# Patient Record
Sex: Male | Born: 1950 | Race: White | Hispanic: No | Marital: Married | Smoking: Never smoker
Health system: Southern US, Community
[De-identification: ages and names within clinical notes are randomized; demographics above are authoritative.]

## PROBLEM LIST (undated history)

## (undated) DIAGNOSIS — I5021 Acute systolic (congestive) heart failure: Secondary | ICD-10-CM

## (undated) DIAGNOSIS — R519 Headache, unspecified: Secondary | ICD-10-CM

## (undated) DIAGNOSIS — H33319 Horseshoe tear of retina without detachment, unspecified eye: Secondary | ICD-10-CM

## (undated) DIAGNOSIS — K219 Gastro-esophageal reflux disease without esophagitis: Secondary | ICD-10-CM

## (undated) DIAGNOSIS — G629 Polyneuropathy, unspecified: Secondary | ICD-10-CM

## (undated) DIAGNOSIS — I10 Essential (primary) hypertension: Secondary | ICD-10-CM

## (undated) DIAGNOSIS — F419 Anxiety disorder, unspecified: Secondary | ICD-10-CM

## (undated) DIAGNOSIS — E291 Testicular hypofunction: Secondary | ICD-10-CM

## (undated) DIAGNOSIS — U071 COVID-19: Secondary | ICD-10-CM

## (undated) DIAGNOSIS — I219 Acute myocardial infarction, unspecified: Secondary | ICD-10-CM

## (undated) DIAGNOSIS — G4733 Obstructive sleep apnea (adult) (pediatric): Secondary | ICD-10-CM

## (undated) DIAGNOSIS — F32A Depression, unspecified: Secondary | ICD-10-CM

## (undated) DIAGNOSIS — E119 Type 2 diabetes mellitus without complications: Secondary | ICD-10-CM

## (undated) DIAGNOSIS — D649 Anemia, unspecified: Secondary | ICD-10-CM

## (undated) DIAGNOSIS — E559 Vitamin D deficiency, unspecified: Secondary | ICD-10-CM

## (undated) DIAGNOSIS — I214 Non-ST elevation (NSTEMI) myocardial infarction: Secondary | ICD-10-CM

## (undated) DIAGNOSIS — Z8709 Personal history of other diseases of the respiratory system: Secondary | ICD-10-CM

## (undated) DIAGNOSIS — R51 Headache: Secondary | ICD-10-CM

## (undated) DIAGNOSIS — E785 Hyperlipidemia, unspecified: Secondary | ICD-10-CM

## (undated) DIAGNOSIS — E1143 Type 2 diabetes mellitus with diabetic autonomic (poly)neuropathy: Secondary | ICD-10-CM

## (undated) DIAGNOSIS — I951 Orthostatic hypotension: Secondary | ICD-10-CM

## (undated) DIAGNOSIS — F329 Major depressive disorder, single episode, unspecified: Secondary | ICD-10-CM

## (undated) DIAGNOSIS — C801 Malignant (primary) neoplasm, unspecified: Secondary | ICD-10-CM

## (undated) DIAGNOSIS — M1990 Unspecified osteoarthritis, unspecified site: Secondary | ICD-10-CM

## (undated) DIAGNOSIS — M549 Dorsalgia, unspecified: Secondary | ICD-10-CM

## (undated) DIAGNOSIS — G8929 Other chronic pain: Secondary | ICD-10-CM

## (undated) DIAGNOSIS — I509 Heart failure, unspecified: Secondary | ICD-10-CM

## (undated) DIAGNOSIS — Z87442 Personal history of urinary calculi: Secondary | ICD-10-CM

## (undated) DIAGNOSIS — R3911 Hesitancy of micturition: Secondary | ICD-10-CM

## (undated) DIAGNOSIS — M4802 Spinal stenosis, cervical region: Secondary | ICD-10-CM

## (undated) DIAGNOSIS — M5135 Other intervertebral disc degeneration, thoracolumbar region: Secondary | ICD-10-CM

## (undated) DIAGNOSIS — L409 Psoriasis, unspecified: Secondary | ICD-10-CM

## (undated) DIAGNOSIS — R0600 Dyspnea, unspecified: Secondary | ICD-10-CM

## (undated) DIAGNOSIS — S83206A Unspecified tear of unspecified meniscus, current injury, right knee, initial encounter: Secondary | ICD-10-CM

## (undated) HISTORY — PX: BACK SURGERY: SHX140

## (undated) HISTORY — PX: CATARACT EXTRACTION W/ INTRAOCULAR LENS  IMPLANT, BILATERAL: SHX1307

## (undated) HISTORY — PX: EXTRACORPOREAL SHOCK WAVE LITHOTRIPSY: SHX1557

## (undated) HISTORY — DX: Type 2 diabetes mellitus with diabetic autonomic (poly)neuropathy: E11.43

## (undated) HISTORY — DX: Horseshoe tear of retina without detachment, unspecified eye: H33.319

## (undated) HISTORY — PX: OTHER SURGICAL HISTORY: SHX169

---

## 1994-08-14 HISTORY — PX: RADICAL ORCHIECTOMY: SHX2285

## 1999-02-23 ENCOUNTER — Ambulatory Visit (HOSPITAL_COMMUNITY): Admission: RE | Admit: 1999-02-23 | Discharge: 1999-02-23 | Payer: Self-pay | Admitting: Family Medicine

## 1999-02-23 ENCOUNTER — Encounter: Payer: Self-pay | Admitting: Family Medicine

## 1999-06-06 ENCOUNTER — Encounter: Payer: Self-pay | Admitting: Family Medicine

## 1999-06-06 ENCOUNTER — Ambulatory Visit (HOSPITAL_COMMUNITY): Admission: RE | Admit: 1999-06-06 | Discharge: 1999-06-06 | Payer: Self-pay | Admitting: Family Medicine

## 1999-06-28 ENCOUNTER — Emergency Department (HOSPITAL_COMMUNITY): Admission: EM | Admit: 1999-06-28 | Discharge: 1999-06-28 | Payer: Self-pay | Admitting: Emergency Medicine

## 1999-06-28 ENCOUNTER — Encounter: Payer: Self-pay | Admitting: Emergency Medicine

## 1999-09-07 ENCOUNTER — Encounter: Payer: Self-pay | Admitting: Urology

## 1999-09-08 ENCOUNTER — Ambulatory Visit (HOSPITAL_COMMUNITY): Admission: RE | Admit: 1999-09-08 | Discharge: 1999-09-08 | Payer: Self-pay | Admitting: Urology

## 1999-09-08 ENCOUNTER — Encounter: Payer: Self-pay | Admitting: Urology

## 1999-10-06 ENCOUNTER — Encounter: Payer: Self-pay | Admitting: Urology

## 1999-10-06 ENCOUNTER — Ambulatory Visit (HOSPITAL_COMMUNITY): Admission: RE | Admit: 1999-10-06 | Discharge: 1999-10-06 | Payer: Self-pay | Admitting: Urology

## 2000-09-06 ENCOUNTER — Emergency Department (HOSPITAL_COMMUNITY): Admission: EM | Admit: 2000-09-06 | Discharge: 2000-09-06 | Payer: Self-pay | Admitting: Emergency Medicine

## 2000-10-07 ENCOUNTER — Encounter: Payer: Self-pay | Admitting: Neurosurgery

## 2000-10-07 ENCOUNTER — Ambulatory Visit (HOSPITAL_COMMUNITY): Admission: RE | Admit: 2000-10-07 | Discharge: 2000-10-07 | Payer: Self-pay | Admitting: Neurosurgery

## 2000-11-07 ENCOUNTER — Encounter: Payer: Self-pay | Admitting: Neurosurgery

## 2000-11-07 ENCOUNTER — Ambulatory Visit (HOSPITAL_COMMUNITY): Admission: RE | Admit: 2000-11-07 | Discharge: 2000-11-07 | Payer: Self-pay | Admitting: Neurosurgery

## 2000-11-21 ENCOUNTER — Encounter: Admission: RE | Admit: 2000-11-21 | Discharge: 2000-11-21 | Payer: Self-pay | Admitting: Neurosurgery

## 2000-11-21 ENCOUNTER — Encounter: Payer: Self-pay | Admitting: Neurosurgery

## 2000-12-05 ENCOUNTER — Encounter: Payer: Self-pay | Admitting: Neurosurgery

## 2000-12-05 ENCOUNTER — Encounter: Admission: RE | Admit: 2000-12-05 | Discharge: 2000-12-05 | Payer: Self-pay | Admitting: Neurosurgery

## 2001-06-04 ENCOUNTER — Encounter: Admission: RE | Admit: 2001-06-04 | Discharge: 2001-06-04 | Payer: Self-pay | Admitting: Neurosurgery

## 2001-06-04 ENCOUNTER — Encounter: Payer: Self-pay | Admitting: Neurosurgery

## 2001-09-26 ENCOUNTER — Encounter: Payer: Self-pay | Admitting: Neurosurgery

## 2001-09-26 ENCOUNTER — Encounter: Admission: RE | Admit: 2001-09-26 | Discharge: 2001-09-26 | Payer: Self-pay | Admitting: Neurosurgery

## 2001-10-15 ENCOUNTER — Encounter: Payer: Self-pay | Admitting: Neurosurgery

## 2001-10-15 ENCOUNTER — Encounter: Admission: RE | Admit: 2001-10-15 | Discharge: 2001-10-15 | Payer: Self-pay | Admitting: Neurosurgery

## 2002-04-21 ENCOUNTER — Encounter: Admission: RE | Admit: 2002-04-21 | Discharge: 2002-04-21 | Payer: Self-pay | Admitting: Neurosurgery

## 2002-04-21 ENCOUNTER — Encounter: Payer: Self-pay | Admitting: Neurosurgery

## 2002-05-06 ENCOUNTER — Encounter: Admission: RE | Admit: 2002-05-06 | Discharge: 2002-05-06 | Payer: Self-pay | Admitting: Neurosurgery

## 2002-05-06 ENCOUNTER — Encounter: Payer: Self-pay | Admitting: Neurosurgery

## 2002-05-20 ENCOUNTER — Encounter: Admission: RE | Admit: 2002-05-20 | Discharge: 2002-05-20 | Payer: Self-pay | Admitting: Thoracic Surgery

## 2002-05-20 ENCOUNTER — Encounter: Payer: Self-pay | Admitting: Neurosurgery

## 2003-01-26 ENCOUNTER — Encounter: Payer: Self-pay | Admitting: Neurosurgery

## 2003-01-26 ENCOUNTER — Encounter: Admission: RE | Admit: 2003-01-26 | Discharge: 2003-01-26 | Payer: Self-pay | Admitting: Neurosurgery

## 2003-01-26 ENCOUNTER — Encounter: Payer: Self-pay | Admitting: Radiology

## 2003-02-11 ENCOUNTER — Encounter: Payer: Self-pay | Admitting: Neurosurgery

## 2003-02-11 ENCOUNTER — Encounter: Admission: RE | Admit: 2003-02-11 | Discharge: 2003-02-11 | Payer: Self-pay | Admitting: Neurosurgery

## 2003-02-25 ENCOUNTER — Encounter: Admission: RE | Admit: 2003-02-25 | Discharge: 2003-02-25 | Payer: Self-pay | Admitting: Neurosurgery

## 2003-02-25 ENCOUNTER — Encounter: Payer: Self-pay | Admitting: Neurosurgery

## 2003-08-18 ENCOUNTER — Encounter: Admission: RE | Admit: 2003-08-18 | Discharge: 2003-08-18 | Payer: Self-pay | Admitting: Neurosurgery

## 2003-09-01 ENCOUNTER — Encounter: Admission: RE | Admit: 2003-09-01 | Discharge: 2003-09-01 | Payer: Self-pay | Admitting: Neurosurgery

## 2003-09-15 ENCOUNTER — Encounter: Admission: RE | Admit: 2003-09-15 | Discharge: 2003-09-15 | Payer: Self-pay | Admitting: Neurosurgery

## 2003-10-01 ENCOUNTER — Ambulatory Visit (HOSPITAL_COMMUNITY): Admission: RE | Admit: 2003-10-01 | Discharge: 2003-10-01 | Payer: Self-pay | Admitting: Family Medicine

## 2004-04-07 ENCOUNTER — Encounter: Admission: RE | Admit: 2004-04-07 | Discharge: 2004-04-07 | Payer: Self-pay | Admitting: Neurosurgery

## 2004-04-22 ENCOUNTER — Encounter: Admission: RE | Admit: 2004-04-22 | Discharge: 2004-04-22 | Payer: Self-pay | Admitting: Neurosurgery

## 2004-07-20 ENCOUNTER — Encounter: Admission: RE | Admit: 2004-07-20 | Discharge: 2004-07-20 | Payer: Self-pay | Admitting: Neurosurgery

## 2004-09-21 ENCOUNTER — Encounter: Admission: RE | Admit: 2004-09-21 | Discharge: 2004-09-21 | Payer: Self-pay | Admitting: Neurosurgery

## 2004-09-30 ENCOUNTER — Emergency Department (HOSPITAL_COMMUNITY): Admission: EM | Admit: 2004-09-30 | Discharge: 2004-09-30 | Payer: Self-pay | Admitting: Emergency Medicine

## 2004-11-10 ENCOUNTER — Encounter: Admission: RE | Admit: 2004-11-10 | Discharge: 2004-11-10 | Payer: Self-pay | Admitting: Neurosurgery

## 2005-01-13 ENCOUNTER — Encounter: Admission: RE | Admit: 2005-01-13 | Discharge: 2005-01-13 | Payer: Self-pay | Admitting: Neurosurgery

## 2005-04-07 ENCOUNTER — Encounter: Admission: RE | Admit: 2005-04-07 | Discharge: 2005-04-07 | Payer: Self-pay | Admitting: Neurosurgery

## 2005-05-31 ENCOUNTER — Encounter: Admission: RE | Admit: 2005-05-31 | Discharge: 2005-05-31 | Payer: Self-pay | Admitting: Neurosurgery

## 2005-07-13 ENCOUNTER — Encounter: Admission: RE | Admit: 2005-07-13 | Discharge: 2005-07-13 | Payer: Self-pay | Admitting: Family Medicine

## 2005-08-14 HISTORY — PX: KNEE ARTHROSCOPY: SUR90

## 2005-10-16 ENCOUNTER — Encounter: Admission: RE | Admit: 2005-10-16 | Discharge: 2005-10-16 | Payer: Self-pay | Admitting: Neurosurgery

## 2006-01-29 ENCOUNTER — Encounter: Admission: RE | Admit: 2006-01-29 | Discharge: 2006-01-29 | Payer: Self-pay | Admitting: Neurosurgery

## 2006-02-19 ENCOUNTER — Encounter: Admission: RE | Admit: 2006-02-19 | Discharge: 2006-02-19 | Payer: Self-pay | Admitting: Neurosurgery

## 2006-05-15 ENCOUNTER — Encounter: Admission: RE | Admit: 2006-05-15 | Discharge: 2006-05-15 | Payer: Self-pay | Admitting: Neurosurgery

## 2007-09-16 ENCOUNTER — Inpatient Hospital Stay (HOSPITAL_COMMUNITY): Admission: RE | Admit: 2007-09-16 | Discharge: 2007-09-19 | Payer: Self-pay | Admitting: Orthopedic Surgery

## 2007-09-16 HISTORY — PX: TOTAL KNEE ARTHROPLASTY: SHX125

## 2010-12-27 NOTE — H&P (Signed)
NAME:  Devin Combs, Devin Combs NO.:  [id number]   MEDICAL RECORD NO.:  [id number]          PATIENT TYPE:  INP   LOCATION:  1609                         FACILITY:  [facility]   PHYSICIAN:  Ollen Gross, M.D.    DATE OF BIRTH:  02/04/1951   DATE OF ADMISSION:  09/16/2007  DATE OF DISCHARGE:                              HISTORY & PHYSICAL   CHIEF COMPLAINT:  Left knee pain.   HISTORY OF PRESENT ILLNESS:  The patient is a 60 year old male who has  been seen by Dr. Lequita Halt for ongoing knee problems.  He has known end-  stage arthritis.  He has been seen and treated conservatively in the  past.  It has been progressive in nature.  It has been progressive  acutely.  It is felt he would benefit from undergoing a knee  replacement.  Risks and benefits have been discussed.  He elects to  proceed with surgery.   ALLERGIES:  No known drug allergies.   CURRENT MEDICATIONS:  Avandamet, lisinopril/hydrochlorothiazide,  simvastatin, diclofenac, AndroGel, metformin, fentanyl patch, Valtrex,  hydrocodone, multivitamin, omega III fish oil, lysine, B12, milk  thistle, vitamin D.   PAST MEDICAL HISTORY:  1. Cataracts.  2. Hypertension.  3. Hypercholesterolemia.  4. History of renal calculi.  5. Diabetes.  6. Arthritis.  7. Degenerative disk disease.   PAST SURGICAL HISTORY:  1. Multiple toe surgeries on the left foot.  2. Big toe surgery on the right foot.  3. Previous knee scope, left knee for torn meniscus.   SOCIAL HISTORY:  Married, retired. Nonsmoker, occasional beer. Family  will be assisting with care after surgery.   FAMILY HISTORY:  Father deceased in his 65s.  Mother with history of  diabetes, hypertension, cholesterol, colitis, and liver cancer.  Grandmother with cancer.   REVIEW OF SYSTEMS:  GENERAL:  No fevers, chills, night sweats.  NEUROLOGIC:  No seizures, syncope, or paralysis.  RESPIRATORY:  No  shortness of breath, productive cough or hemoptysis.   CARDIOVASCULAR:  No chest pain, angina, orthopnea.  GI:  No nausea, vomiting, diarrhea,  constipation.  GU:  No dysuria, hematuria or discharge.  MUSCULOSKELETAL:  Left knee.   PHYSICAL EXAMINATION:  VITAL SIGNS:  Pulse 76, respirations 14, blood  pressure 130/78.  GENERAL:  A 60 year old white male, well nourished, well developed,  overweight, no acute distress, alert, oriented and cooperative.  Good  historian.  Accompanied by his wife.  HEENT: Normocephalic, atraumatic.  Pupils are round and reactive.  Oropharynx clear.  EOMs intact.  NECK:  Supple.  No bruits.  CHEST: Clear.  HEART:  Regular rate and rhythm.  No murmur, S1-S2 noted.  ABDOMEN:  Soft, nontender.  Bowel sounds present.  RECTAL, BREASTS, GENITALIA:  Not done, not pertinent to present illness.  EXTREMITIES:  Left knee:  Range of motion 5-120. Tender medially. Motor  intact.   IMPRESSION:  Osteoarthritis, left knee.   PLAN:  The patient will be admitted to [facility] to undergo a  left total knee replacement arthroplasty.  Surgery will performed by Dr.  Ollen Gross.      Alexzandrew L. Julien Girt, P.A.C.  Ollen Gross, M.D.  Electronically Signed    ALP/MEDQ  D:  09/17/2007  T:  09/17/2007  Job:  [id number]   cc:   Molly Maduro A. Nicholos Johns, M.D.  Fax: [phone]   Payton Doughty, M.D.  Fax: [phone]

## 2010-12-27 NOTE — Discharge Summary (Signed)
NAME:  Devin Combs, Devin Combs NO.:  [id number]   MEDICAL RECORD NO.:  [id number]          PATIENT TYPE:  INP   LOCATION:  1609                         FACILITY:  [facility]   PHYSICIAN:  Ollen Gross, M.D.    DATE OF BIRTH:  1951-03-20   DATE OF ADMISSION:  09/16/2007  DATE OF DISCHARGE:  09/19/2007                               DISCHARGE SUMMARY   ADMITTING DIAGNOSES:  1. Osteoarthritis left knee.  2. Cataract.  3. Hypertension.  4. Hypercholesterolemia.  5. History  of renal calculi.  6. Diabetes.  7. Arthritis.  8. Degenerative disk disease.   DISCHARGE DIAGNOSES:  1. Osteoarthritis left knee status post left total knee arthroplasty.  2. Mild postop hyponatremia improved.  3. Mild postop hypokalemia.  4. Cataract.  5. Hypertension.  6. Hypercholesterolemia.  7. History  of renal calculi.  8. Diabetes.  9. Arthritis.  10.Degenerative disk disease.   PROCEDURE:  September 19, 2007, left total knee.  Surgeon Dr. Lequita Halt.  Assistant Avel Peace PA-C.   CONSULTS:  None.   BRIEF HISTORY:  Devin Combs is a 60 year old male with end-stage  arthritis of the left knee, pain and dysfunction.  Scoped approximately  a year ago, but has progressively gotten worse.  It was felt he would  benefit undergoing knee replacement.  Risks and benefits discussed.  The  patient was subsequently admitted to the hospital.   LABORATORY DATA:  Preop CBC showed hemoglobin of 14.6, hematocrit 42.0.  white cell count 7.3, platelets 257.  Chem panel on admission:  LA  glucose 160, known diabetic, remaining Chem panel within normal limits.  PT/INR preop 12.9 and 1.0, PTT of 28.  Preop UA negative.  Serial CBCs  were followed.  Hemoglobin did drop down to 11.9, last noted H&H 11.0  and 31.7.  Serial protimes followed.  PT/INR 29.4 and 2.7.  Serial B-  mets were followed.  Sodium did drop down to 134 back up to 135,  potassium dropped down to 3.8, last noted 3.4 given potassium  supplements prior to discharge.   X-rays two-view chest dated October 25, 2006, no acute pulmonary disease  or pulmonary edema.   HOSPITAL COURSE:  The patient was admitted to [facility],  tolerated procedure well, later transferred to recovery on the  orthopedic floor.  Started on p.o. and IV p.c. analgesics.  Given 24  hours postop IV antibiotics.  Started on Coumadin for DVT prophylaxis.  Resumed his home medications.  Due to the decrease in his activity and  also his diet, we have resumed his diabetic medications with the  metformin in the morning and Avandamet in the evening.  However, cut the  dosages in half.  Instead of 1000 in the morning, he was taking 500  instead of two of the Avandamet, in the evening he was only taking one.  CBCs were followed daily, very closely.  His CBGs remained in the 130s  to the 170s.  Resumed his blood pressure medications.  We accepted the  ACE since his pressure was on the low normal side.  We monitored that  closely.  Had a  rough night on the evening of surgery, but by the next  morning, he was doing a little bit better.  By day two, pain was under  better control, but he was not tolerating the CPM very well.  So he  discontinued the CPM.  He started getting up with therapy a little bit  more.  On day two, dressing change, incision looked good. He actually  walked 200 feet later that day.  He did very well.  CBCs were in the  140s.  He still had low appetite, so we kept the dosages low on his  sugar pills.  The blood pressure was still low stable, low but normal,  so we continued to hold the ACE inhibitor.  Blood pressure was starting  to creep back up by day three.  So we are going to reinstate the blood  pressure medication at home.  Hemoglobin was stable.  He was doing well  and ready to go home.   DISCHARGE/PLAN:  1. The patient discharged home on September 19, 2007.  2. Discharge diagnoses, please see above.  3. Discharge  medications Coumadin, Percocet, Robaxin.   DISCHARGE INSTRUCTIONS:  1. Diet:  Modified carb diabetic diet.  2. Activity:  He is weightbearing as tolerated to the left lower      extremity, gait training, ambulation, ADLs, home health PT, home      health nursing for total knee protocol.  3. Followup:  He needs to follow-up in the office 2 weeks from the      date of surgery, please contact the office at [phone].  Set up a      follow-up appoint with Dr. Lequita Halt.  He also has a scheduled      history and physical with his medical physician in the next several      weeks.  He will follow up as previously scheduled.   DISPOSITION:  Home.   CONDITION ON DISCHARGE:  Improving.   FURTHER INSTRUCTIONS:  I have talked to the patient prior to his  discharge about reducing his diabetic medications.  He is going to  follow with CBGs a little more often.  He normally checks them daily.  We are going to follow up twice a day minimum at home for the next  couple of weeks.  As his appetite improves and his activity improves, he  will reinstitute his regimen at home.  However, we are on a reduced dose  at time of discharge.      Devin Combs, P.A.C.      Ollen Gross, M.D.  Electronically Signed    ALP/MEDQ  D:  09/19/2007  T:  09/20/2007  Job:  [id number]   cc:   Ollen Gross, M.D.  Fax: [phone]   Elana Alm. Nicholos Johns, M.D.  Fax: [phone]   Payton Doughty, M.D.  Fax: [phone]

## 2010-12-27 NOTE — Op Note (Signed)
NAME:  Devin Combs, Devin Combs NO.:  [id number]   MEDICAL RECORD NO.:  [id number]          PATIENT TYPE:  INP   LOCATION:  0008                         [facility]ACILITY:  [facility]   PHYSICIAN:  Ollen Gross, M.D.    DATE OF BIRTH:  1951/06/12   DATE OF PROCEDURE:  09/16/2007  DATE OF DISCHARGE:                               OPERATIVE REPORT   PREOPERATIVE DIAGNOSES:  Osteoarthritis left knee.   POSTOPERATIVE DIAGNOSES:  Osteoarthritis left knee.   PROCEDURE:  Left total knee arthroplasty.   SURGEON:  Dr. Homero Fellers Aluisio.   ASSISTANT:  Avel Peace, PA-C.   ANESTHESIA:  Spinal.   ESTIMATED BLOOD LOSS:  Minimal.   DRAINS:  None.   TOURNIQUET TIME:  38 minutes at 300 mmHg.   COMPLICATIONS:  None.   CONDITION:  Stable to recovery.   CLINICAL NOTE:  Devin Combs is a 60 year old male with end-stage arthritis of  the left knee with progressively worsening pain and dysfunction.  He had  a scope done approximately a year ago and had some degenerative change  in the medial compartment.  He has gotten progressively worse  degenerative change since. Radiographs have noted progression of the  medial compartment disease.  He has not responded to injections  including Viscosupplementation.  He presents now for a total knee  arthroplasty.   PROCEDURE IN DETAIL:  After successful administration of spinal  anesthetic, a tourniquet was placed high on the left thigh and left  lower extremity prepped and draped in the usual sterile fashion.  The  extremity is wrapped in Esmarch, knee flexed and tourniquet inflated to  300 mmHg.  A midline incision is made with a 10 blade through the  subcutaneous tissue to the level of the extensor mechanism.  A fresh  blade is used to make a medial parapatellar arthrotomy.  The soft tissue  over the proximal medial tibia is subperiosteally elevated to the joint  line with a knife and into the semimembranosus bursa with a Cobb  elevator.  The soft tissue  laterally is elevated with attention being  paid to avoiding the patellar tendon on tibial tubercle.  The patella  was subluxed laterally, knee flexed 90 degrees, ACL and PCL removed.  A  drill was used to create a starting hole in the distal femur and the  canal was thoroughly irrigated.  The 5 degree left valgus alignment  guide was placed and referencing off the posterior condyles rotation is  marked and the block pinned to remove 11 mm off the distal femur.  I  took an 69 because of a preop flexion contracture.  Distal femoral  resection is made with an oscillating saw.  A sizing block is placed and  a size 4 is the most appropriate.  Rotation is marked off the  epicondylar axis.  A size 4 cutting block is placed and the anterior,  posterior and chamfer cuts made.   The tibia is subluxed forward and the menisci are removed.  The  extramedullary tibial alignment guide is placed referencing proximally  at the medial aspect of the tibial tubercle and distally along the  second metatarsal axis and tibial crest.  The block is pinned to remove  10 mm from the non deficient lateral side.  Tibial resection is made  with an oscillating saw.  A size 4 is the most appropriate tibial  component and the proximal tibia is prepared with the modular drill and  keel punch for a size 4.  Femoral preparation is completed with the  intercondylar cut.   A size 4 mobile bearing tibial trial,  size 4 posterior stabilized  femoral trial and a 10 mm posterior stabilized rotating platform insert  trial are placed.  With the 10, full extension is achieved with  excellent varus and valgus balance throughout full range of motion.  The  patella was then everted and thickness measured to be 26 mm.  Freehand  resection was taken to 14 mm, the 41 template is placed, lug holes were  drilled, trial patella was placed and it tracks normally.  Osteophytes  were removed from the posterior femur with the trial in place.   All  trials were removed and the cut bone surfaces were prepared with  pulsatile lavage.  The cement was mixed and once ready for implantation  the size 4 mobile bearing tibial tray, size 4 posterior stabilized femur  and 41 patella are cemented into place and the patella was held with the  clamp.  A trial 10-mm insert was placed, knee held in full extension and  all extruded cement removed.  When the cement was fully hardened and the  trials removed, the wound was copiously irrigated with saline solution  and FloSeal injected on the posterior capsule.  The permanent 10 mm  posterior stabilized rotating platform insert is then placed in the  tibial tray.  FloSeal was placed in the medial and lateral gutters and  suprapatellar area. A  moist sponge was placed and tourniquet released  for a total time of 38 minutes.  After 2 minutes, the sponges was  removed and minimal bleeding was encountered.  The bleeding that was  encountered was stopped with electrocautery.  We then irrigated again  and then closed the extensor mechanism with interrupted #1 PDS.  Flexion  against gravity is about 130 degrees at which point the calf hits the  posterior thigh.  The subcu was closed with interrupted 2-0 Vicryl,  subcuticular with running 4-0 Monocryl.  The incisions was cleaned and  dried and Steri-Strips and a bulky sterile dressing applied.  He was  then awakened and transported to recovery in stable condition.      Ollen Gross, M.D.  Electronically Signed     FA/MEDQ  D:  09/16/2007  T:  09/16/2007  Job:  [id number]

## 2011-05-04 LAB — COMPREHENSIVE METABOLIC PANEL
AST: 33
Albumin: 4.2
Alkaline Phosphatase: 64
Chloride: 99
GFR calc non Af Amer: >60
Glucose, Bld: 160 — ABNORMAL HIGH
Potassium: 4
Sodium: 136
Total Protein: 7

## 2011-05-04 LAB — URINALYSIS, ROUTINE W REFLEX MICROSCOPIC
Glucose, UA: NEGATIVE
Protein, ur: NEGATIVE
Specific Gravity, Urine: 1.009
Urobilinogen, UA: 0.2
pH: 6

## 2011-05-04 LAB — PROTIME-INR
INR: 1
Prothrombin Time: 12.9

## 2011-05-04 LAB — CBC
HCT: 42
RDW: 13.8
WBC: 7.3

## 2011-05-05 LAB — BASIC METABOLIC PANEL
BUN: 6
BUN: 9
Calcium: 8.7
Calcium: 9.2
Chloride: 94 — ABNORMAL LOW
Chloride: 96
Chloride: 99
Creatinine, Ser: 0.72
GFR calc non Af Amer: >60
Glucose, Bld: 135 — ABNORMAL HIGH
Glucose, Bld: 175 — ABNORMAL HIGH
Potassium: 3.4 — ABNORMAL LOW
Potassium: 3.8

## 2011-05-05 LAB — CBC
HCT: 31.3 — ABNORMAL LOW
Hemoglobin: 11 — ABNORMAL LOW
Hemoglobin: 11 — ABNORMAL LOW
Hemoglobin: 11.9 — ABNORMAL LOW
MCHC: 35.3
MCV: 85.3
Platelets: 213
Platelets: 223
RBC: 3.67 — ABNORMAL LOW
RBC: 3.68 — ABNORMAL LOW
RBC: 3.97 — ABNORMAL LOW
RDW: 13.4
WBC: 7.8
WBC: 9.6
WBC: 9.8

## 2011-05-05 LAB — PROTIME-INR: Prothrombin Time: 21.7 — ABNORMAL HIGH

## 2011-05-05 LAB — TYPE AND SCREEN: ABO/RH(D): O POS

## 2011-05-05 LAB — ABO/RH: ABO/RH(D): O POS

## 2011-06-26 ENCOUNTER — Encounter (INDEPENDENT_AMBULATORY_CARE_PROVIDER_SITE_OTHER): Payer: 59 | Admitting: Ophthalmology

## 2011-06-26 DIAGNOSIS — H33309 Unspecified retinal break, unspecified eye: Secondary | ICD-10-CM

## 2011-06-26 DIAGNOSIS — H43819 Vitreous degeneration, unspecified eye: Secondary | ICD-10-CM

## 2011-06-26 DIAGNOSIS — E1139 Type 2 diabetes mellitus with other diabetic ophthalmic complication: Secondary | ICD-10-CM

## 2011-06-26 DIAGNOSIS — E11319 Type 2 diabetes mellitus with unspecified diabetic retinopathy without macular edema: Secondary | ICD-10-CM

## 2011-09-06 ENCOUNTER — Other Ambulatory Visit: Payer: Self-pay | Admitting: Neurosurgery

## 2011-09-06 DIAGNOSIS — M47816 Spondylosis without myelopathy or radiculopathy, lumbar region: Secondary | ICD-10-CM

## 2011-09-13 ENCOUNTER — Ambulatory Visit
Admission: RE | Admit: 2011-09-13 | Discharge: 2011-09-13 | Disposition: A | Discharge status: Home / Self Care | Payer: 59 | Source: Ambulatory Visit | Attending: Neurosurgery | Admitting: Neurosurgery

## 2011-09-13 DIAGNOSIS — M47816 Spondylosis without myelopathy or radiculopathy, lumbar region: Secondary | ICD-10-CM

## 2012-06-26 ENCOUNTER — Encounter (INDEPENDENT_AMBULATORY_CARE_PROVIDER_SITE_OTHER): Payer: 59 | Admitting: Ophthalmology

## 2012-06-26 DIAGNOSIS — E11319 Type 2 diabetes mellitus with unspecified diabetic retinopathy without macular edema: Secondary | ICD-10-CM

## 2012-06-26 DIAGNOSIS — H43819 Vitreous degeneration, unspecified eye: Secondary | ICD-10-CM

## 2012-06-26 DIAGNOSIS — H35039 Hypertensive retinopathy, unspecified eye: Secondary | ICD-10-CM

## 2012-06-26 DIAGNOSIS — I10 Essential (primary) hypertension: Secondary | ICD-10-CM

## 2012-06-26 DIAGNOSIS — E1139 Type 2 diabetes mellitus with other diabetic ophthalmic complication: Secondary | ICD-10-CM

## 2012-06-26 DIAGNOSIS — H33309 Unspecified retinal break, unspecified eye: Secondary | ICD-10-CM

## 2012-06-26 DIAGNOSIS — H2510 Age-related nuclear cataract, unspecified eye: Secondary | ICD-10-CM

## 2013-06-27 ENCOUNTER — Ambulatory Visit (INDEPENDENT_AMBULATORY_CARE_PROVIDER_SITE_OTHER): Payer: 59 | Admitting: Ophthalmology

## 2013-06-27 DIAGNOSIS — H35039 Hypertensive retinopathy, unspecified eye: Secondary | ICD-10-CM

## 2013-06-27 DIAGNOSIS — H2510 Age-related nuclear cataract, unspecified eye: Secondary | ICD-10-CM

## 2013-06-27 DIAGNOSIS — E1139 Type 2 diabetes mellitus with other diabetic ophthalmic complication: Secondary | ICD-10-CM

## 2013-06-27 DIAGNOSIS — I10 Essential (primary) hypertension: Secondary | ICD-10-CM

## 2013-06-27 DIAGNOSIS — H43819 Vitreous degeneration, unspecified eye: Secondary | ICD-10-CM

## 2013-06-27 DIAGNOSIS — H33309 Unspecified retinal break, unspecified eye: Secondary | ICD-10-CM

## 2013-06-27 DIAGNOSIS — E11319 Type 2 diabetes mellitus with unspecified diabetic retinopathy without macular edema: Secondary | ICD-10-CM

## 2014-07-13 ENCOUNTER — Ambulatory Visit (INDEPENDENT_AMBULATORY_CARE_PROVIDER_SITE_OTHER): Payer: 59 | Admitting: Ophthalmology

## 2015-02-02 ENCOUNTER — Ambulatory Visit: Payer: Self-pay | Admitting: Orthopedic Surgery

## 2015-02-02 NOTE — Progress Notes (Signed)
Preoperative surgical orders have been place into the Epic hospital system for Devin Combs on 02/02/2015, 12:36 PM  by Mickel Crow for surgery on 02-10-2015.  Preop Knee Scope orders including IV Tylenol and IV Decadron as long as there are no contraindications to the above medications. Arlee Muslim, PA-C

## 2015-02-05 ENCOUNTER — Encounter (HOSPITAL_BASED_OUTPATIENT_CLINIC_OR_DEPARTMENT_OTHER): Payer: Self-pay | Admitting: *Deleted

## 2015-02-05 NOTE — Progress Notes (Signed)
NPO AFTER MN.  ARRIVE AT 0915.  NEEDS ISTAT AND EKG.  WILL TAKE NORVASC AND CRESTOR AM DOS W/ SIPS OF WATER.

## 2015-02-10 ENCOUNTER — Encounter (HOSPITAL_BASED_OUTPATIENT_CLINIC_OR_DEPARTMENT_OTHER): Payer: Self-pay | Admitting: Anesthesiology

## 2015-02-10 ENCOUNTER — Ambulatory Visit (HOSPITAL_BASED_OUTPATIENT_CLINIC_OR_DEPARTMENT_OTHER): Payer: 59 | Admitting: Anesthesiology

## 2015-02-10 ENCOUNTER — Ambulatory Visit (HOSPITAL_BASED_OUTPATIENT_CLINIC_OR_DEPARTMENT_OTHER)
Admission: RE | Admit: 2015-02-10 | Discharge: 2015-02-10 | Disposition: A | Discharge status: Home / Self Care | Payer: 59 | Source: Ambulatory Visit | Attending: Orthopedic Surgery | Admitting: Orthopedic Surgery

## 2015-02-10 ENCOUNTER — Encounter (HOSPITAL_BASED_OUTPATIENT_CLINIC_OR_DEPARTMENT_OTHER)
Admission: RE | Disposition: A | Discharge status: Home / Self Care | Payer: Self-pay | Source: Ambulatory Visit | Attending: Orthopedic Surgery

## 2015-02-10 DIAGNOSIS — L409 Psoriasis, unspecified: Secondary | ICD-10-CM | POA: Diagnosis not present

## 2015-02-10 DIAGNOSIS — E785 Hyperlipidemia, unspecified: Secondary | ICD-10-CM | POA: Diagnosis not present

## 2015-02-10 DIAGNOSIS — Z7982 Long term (current) use of aspirin: Secondary | ICD-10-CM | POA: Insufficient documentation

## 2015-02-10 DIAGNOSIS — I10 Essential (primary) hypertension: Secondary | ICD-10-CM | POA: Insufficient documentation

## 2015-02-10 DIAGNOSIS — M5135 Other intervertebral disc degeneration, thoracolumbar region: Secondary | ICD-10-CM | POA: Diagnosis not present

## 2015-02-10 DIAGNOSIS — Z87442 Personal history of urinary calculi: Secondary | ICD-10-CM | POA: Insufficient documentation

## 2015-02-10 DIAGNOSIS — K219 Gastro-esophageal reflux disease without esophagitis: Secondary | ICD-10-CM | POA: Insufficient documentation

## 2015-02-10 DIAGNOSIS — Z79899 Other long term (current) drug therapy: Secondary | ICD-10-CM | POA: Diagnosis not present

## 2015-02-10 DIAGNOSIS — Y929 Unspecified place or not applicable: Secondary | ICD-10-CM | POA: Diagnosis not present

## 2015-02-10 DIAGNOSIS — X58XXXA Exposure to other specified factors, initial encounter: Secondary | ICD-10-CM | POA: Insufficient documentation

## 2015-02-10 DIAGNOSIS — G4733 Obstructive sleep apnea (adult) (pediatric): Secondary | ICD-10-CM | POA: Diagnosis not present

## 2015-02-10 DIAGNOSIS — S83249A Other tear of medial meniscus, current injury, unspecified knee, initial encounter: Secondary | ICD-10-CM | POA: Diagnosis present

## 2015-02-10 DIAGNOSIS — S83241A Other tear of medial meniscus, current injury, right knee, initial encounter: Secondary | ICD-10-CM | POA: Diagnosis not present

## 2015-02-10 DIAGNOSIS — E119 Type 2 diabetes mellitus without complications: Secondary | ICD-10-CM | POA: Insufficient documentation

## 2015-02-10 HISTORY — DX: Dorsalgia, unspecified: M54.9

## 2015-02-10 HISTORY — DX: Psoriasis, unspecified: L40.9

## 2015-02-10 HISTORY — DX: Gastro-esophageal reflux disease without esophagitis: K21.9

## 2015-02-10 HISTORY — DX: Other chronic pain: G89.29

## 2015-02-10 HISTORY — DX: Unspecified osteoarthritis, unspecified site: M19.90

## 2015-02-10 HISTORY — DX: Obstructive sleep apnea (adult) (pediatric): G47.33

## 2015-02-10 HISTORY — DX: Unspecified tear of unspecified meniscus, current injury, right knee, initial encounter: S83.206A

## 2015-02-10 HISTORY — DX: Essential (primary) hypertension: I10

## 2015-02-10 HISTORY — DX: Spinal stenosis, cervical region: M48.02

## 2015-02-10 HISTORY — PX: KNEE ARTHROSCOPY: SHX127

## 2015-02-10 HISTORY — DX: Personal history of urinary calculi: Z87.442

## 2015-02-10 HISTORY — DX: Other intervertebral disc degeneration, thoracolumbar region: M51.35

## 2015-02-10 HISTORY — DX: Hyperlipidemia, unspecified: E78.5

## 2015-02-10 HISTORY — DX: Type 2 diabetes mellitus without complications: E11.9

## 2015-02-10 LAB — POCT I-STAT 4, (NA,K, GLUC, HGB,HCT)
Glucose, Bld: 196 mg/dL — ABNORMAL HIGH (ref 65–99)
HCT: 44 % (ref 39.0–52.0)
Hemoglobin: 15 g/dL (ref 13.0–17.0)
Potassium: 4.3 mmol/L (ref 3.5–5.1)
Sodium: 140 mmol/L (ref 135–145)

## 2015-02-10 LAB — GLUCOSE, CAPILLARY: GLUCOSE-CAPILLARY: 148 mg/dL — AB (ref 65–99)

## 2015-02-10 SURGERY — ARTHROSCOPY, KNEE
Anesthesia: General | Site: Knee | Laterality: Right

## 2015-02-10 MED ORDER — OXYCODONE HCL 5 MG PO TABS: 5.0000 mg | ORAL_TABLET | ORAL | Status: DC | PRN

## 2015-02-10 MED ORDER — ACETAMINOPHEN 10 MG/ML IV SOLN
1000.0000 mg | Freq: Once | INTRAVENOUS | Status: AC
  Administered 2015-02-10: 1000 mg via INTRAVENOUS
  Filled 2015-02-10: qty 100

## 2015-02-10 MED ORDER — CEFAZOLIN SODIUM 1-5 GM-% IV SOLN
INTRAVENOUS | Status: AC
  Filled 2015-02-10: qty 50

## 2015-02-10 MED ORDER — KETOROLAC TROMETHAMINE 30 MG/ML IJ SOLN
INTRAMUSCULAR | Status: DC | PRN
  Administered 2015-02-10: 30 mg via INTRAVENOUS

## 2015-02-10 MED ORDER — MIDAZOLAM HCL 5 MG/5ML IJ SOLN
INTRAMUSCULAR | Status: DC | PRN
  Administered 2015-02-10: 2 mg via INTRAVENOUS

## 2015-02-10 MED ORDER — FENTANYL CITRATE (PF) 100 MCG/2ML IJ SOLN
INTRAMUSCULAR | Status: AC
  Filled 2015-02-10: qty 4

## 2015-02-10 MED ORDER — FENTANYL CITRATE (PF) 100 MCG/2ML IJ SOLN
25.0000 ug | INTRAMUSCULAR | Status: DC | PRN
  Filled 2015-02-10: qty 1

## 2015-02-10 MED ORDER — FENTANYL CITRATE (PF) 100 MCG/2ML IJ SOLN
INTRAMUSCULAR | Status: DC | PRN
  Administered 2015-02-10: 100 ug via INTRAVENOUS

## 2015-02-10 MED ORDER — CEFAZOLIN SODIUM-DEXTROSE 2-3 GM-% IV SOLR
INTRAVENOUS | Status: AC
  Filled 2015-02-10: qty 50

## 2015-02-10 MED ORDER — SODIUM CHLORIDE 0.9 % IV SOLN
INTRAVENOUS | Status: DC
  Filled 2015-02-10: qty 1000

## 2015-02-10 MED ORDER — CHLORHEXIDINE GLUCONATE 4 % EX LIQD
60.0000 mL | Freq: Once | CUTANEOUS | Status: AC
  Administered 2015-02-10: 4 via TOPICAL
  Filled 2015-02-10: qty 60

## 2015-02-10 MED ORDER — OXYCODONE HCL 5 MG PO TABS
5.0000 mg | ORAL_TABLET | Freq: Once | ORAL | Status: AC
  Administered 2015-02-10: 5 mg via ORAL
  Filled 2015-02-10: qty 1

## 2015-02-10 MED ORDER — DEXAMETHASONE SODIUM PHOSPHATE 10 MG/ML IJ SOLN
10.0000 mg | Freq: Once | INTRAMUSCULAR | Status: DC
  Filled 2015-02-10: qty 1

## 2015-02-10 MED ORDER — BUPIVACAINE-EPINEPHRINE 0.25% -1:200000 IJ SOLN
INTRAMUSCULAR | Status: DC | PRN
  Administered 2015-02-10: 20 mL

## 2015-02-10 MED ORDER — ONDANSETRON HCL 4 MG/2ML IJ SOLN
INTRAMUSCULAR | Status: DC | PRN
  Administered 2015-02-10: 4 mg via INTRAVENOUS

## 2015-02-10 MED ORDER — LACTATED RINGERS IV SOLN
INTRAVENOUS | Status: DC
  Filled 2015-02-10: qty 1000

## 2015-02-10 MED ORDER — METHOCARBAMOL 500 MG PO TABS: 500.0000 mg | ORAL_TABLET | Freq: Four times a day (QID) | ORAL | Status: DC

## 2015-02-10 MED ORDER — SODIUM CHLORIDE 0.9 % IR SOLN
Status: DC | PRN
  Administered 2015-02-10: 6000 mL

## 2015-02-10 MED ORDER — MIDAZOLAM HCL 2 MG/2ML IJ SOLN
INTRAMUSCULAR | Status: AC
Start: 2015-02-10 — End: 2015-02-10
  Filled 2015-02-10: qty 2

## 2015-02-10 MED ORDER — LACTATED RINGERS IV SOLN
INTRAVENOUS | Status: DC
  Administered 2015-02-10: 11:00:00 via INTRAVENOUS
  Filled 2015-02-10: qty 1000

## 2015-02-10 MED ORDER — PHENYLEPHRINE HCL 10 MG/ML IJ SOLN
INTRAMUSCULAR | Status: DC | PRN
  Administered 2015-02-10 (×4): 80 ug via INTRAVENOUS

## 2015-02-10 MED ORDER — CEFAZOLIN SODIUM-DEXTROSE 2-3 GM-% IV SOLR
2.0000 g | INTRAVENOUS | Status: AC
  Administered 2015-02-10: 3 g via INTRAVENOUS
  Filled 2015-02-10: qty 50

## 2015-02-10 MED ORDER — PROPOFOL 10 MG/ML IV BOLUS
INTRAVENOUS | Status: DC | PRN
  Administered 2015-02-10: 300 mg via INTRAVENOUS

## 2015-02-10 MED ORDER — OXYCODONE HCL 5 MG PO TABS
ORAL_TABLET | ORAL | Status: AC
  Filled 2015-02-10: qty 1

## 2015-02-10 MED ORDER — LIDOCAINE HCL (CARDIAC) 20 MG/ML IV SOLN
INTRAVENOUS | Status: DC | PRN
  Administered 2015-02-10: 100 mg via INTRAVENOUS

## 2015-02-10 SURGICAL SUPPLY — 43 items
BANDAGE ELASTIC 6 VELCRO ST LF (GAUZE/BANDAGES/DRESSINGS) ×2 IMPLANT
BLADE 4.2CUDA (BLADE) ×2 IMPLANT
BLADE CUDA SHAVER 3.5 (BLADE) IMPLANT
BLADE CUTTER GATOR 3.5 (BLADE) IMPLANT
CANISTER SUCT LVC 12 LTR MEDI- (MISCELLANEOUS) IMPLANT
CANISTER SUCTION 2500CC (MISCELLANEOUS) IMPLANT
CLOTH BEACON ORANGE TIMEOUT ST (SAFETY) ×2 IMPLANT
CUFF TOURN SGL QUICK 34 (TOURNIQUET CUFF) ×1
CUFF TRNQT CYL 34X4X40X1 (TOURNIQUET CUFF) ×1 IMPLANT
DRAPE ARTHROSCOPY W/POUCH 114 (DRAPES) ×2 IMPLANT
DRAPE U-SHAPE 47X51 STRL (DRAPES) IMPLANT
DRSG EMULSION OIL 3X3 NADH (GAUZE/BANDAGES/DRESSINGS) ×2 IMPLANT
DRSG PAD ABDOMINAL 8X10 ST (GAUZE/BANDAGES/DRESSINGS) ×2 IMPLANT
DURAPREP 26ML APPLICATOR (WOUND CARE) ×2 IMPLANT
ELECT MENISCUS 165MM 90D (ELECTRODE) IMPLANT
ELECT REM PT RETURN 9FT ADLT (ELECTROSURGICAL)
ELECTRODE REM PT RTRN 9FT ADLT (ELECTROSURGICAL) IMPLANT
GLOVE BIO SURGEON STRL SZ 6.5 (GLOVE) ×2 IMPLANT
GLOVE BIO SURGEON STRL SZ8 (GLOVE) ×2 IMPLANT
GLOVE INDICATOR 6.5 STRL GRN (GLOVE) ×2 IMPLANT
GLOVE INDICATOR 8.0 STRL GRN (GLOVE) ×2 IMPLANT
GOWN STRL REUS W/ TWL LRG LVL3 (GOWN DISPOSABLE) ×2 IMPLANT
GOWN STRL REUS W/TWL LRG LVL3 (GOWN DISPOSABLE) ×2
IV NS IRRIG 3000ML ARTHROMATIC (IV SOLUTION) ×4 IMPLANT
KNEE WRAP E Z 3 GEL PACK (MISCELLANEOUS) ×2 IMPLANT
MANIFOLD NEPTUNE II (INSTRUMENTS) ×2 IMPLANT
PACK ARTHROSCOPY DSU (CUSTOM PROCEDURE TRAY) ×2 IMPLANT
PACK BASIN DAY SURGERY FS (CUSTOM PROCEDURE TRAY) ×2 IMPLANT
PADDING CAST ABS 4INX4YD NS (CAST SUPPLIES) ×1
PADDING CAST ABS 6INX4YD NS (CAST SUPPLIES) ×1
PADDING CAST ABS COTTON 4X4 ST (CAST SUPPLIES) ×1 IMPLANT
PADDING CAST ABS COTTON 6X4 NS (CAST SUPPLIES) ×1 IMPLANT
PADDING CAST COTTON 6X4 STRL (CAST SUPPLIES) ×2 IMPLANT
PENCIL BUTTON HOLSTER BLD 10FT (ELECTRODE) IMPLANT
SET ARTHROSCOPY TUBING (MISCELLANEOUS) ×1
SET ARTHROSCOPY TUBING LN (MISCELLANEOUS) ×1 IMPLANT
SPONGE GAUZE 4X4 12PLY (GAUZE/BANDAGES/DRESSINGS) ×2 IMPLANT
SPONGE GAUZE 4X4 12PLY STER LF (GAUZE/BANDAGES/DRESSINGS) ×2 IMPLANT
SUT ETHILON 4 0 PS 2 18 (SUTURE) ×2 IMPLANT
TOWEL OR 17X24 6PK STRL BLUE (TOWEL DISPOSABLE) ×2 IMPLANT
WAND 30 DEG SABER W/CORD (SURGICAL WAND) IMPLANT
WAND 90 DEG TURBOVAC W/CORD (SURGICAL WAND) ×2 IMPLANT
WATER STERILE IRR 500ML POUR (IV SOLUTION) ×2 IMPLANT

## 2015-02-10 NOTE — Op Note (Signed)
Preoperative diagnosis-  Right knee medial meniscal tear  Postoperative diagnosis Right- knee medial meniscal tear plus  Medial femoral chondral defect  Procedure- Right knee arthroscopy with medial  meniscal debridement and chondroplasty   Surgeon- Dione Plover. Roshun Klingensmith, MD  Anesthesia-General  EBL-  Minimal  Complications- None  Condition- PACU - hemodynamically stable.  Brief clinical note- -Devin Combs is a 64 y.o.  male with a several month history of right knee pain and mechanical symptoms. Exam and history suggested medial meniscal tear confirmed by MRI. The patient presents now for arthroscopy and debridement   Procedure in detail -       After successful administration of General anesthetic, a tourmiquet is placed high on the Right  thigh and the Right lower extremity is prepped and draped in the usual sterile fashion. Time out is performed by the surgical team. Standard superomedial and inferolateral portal sites are marked and incisions made with an 11 blade. The inflow cannula is passed through the superomedial portal and camera through the inferolateral portal and inflow is initiated. Arthroscopic visualization proceeds.      The undersurface of the patella and trochlea are visualized and they are normal. The medial and lateral gutters are visualized and there are a few scattered loose bodies which are flushed out of the joint through the outflow cannula no loose bodies. Flexion and valgus force is applied to the knee and the medial compartment is entered. A spinal needle is passed into the joint through the site marked for the inferomedial portal. A small incision is made and the dilator passed into the joint. The findings for the medial compartment are degenerative unstable tear of the body and posterior horn of the medial meniscus with a large unstable cartilage flap on the medial femoral condyle with exposed underlying bone. Defect size is approximately 2 x 2 cm. . The tear  is debrided to a stable base with baskets and a shaver and sealed off with the Arthrocare. The shaver is used to debride the unstable cartilage to a stable bony base with stable edges. It is probed and found to be stable. The exposed bone is abraded with the shaver.    The intercondylar notch is visualized and the ACL appears normal. The lateral compartment is entered and the findings are normal .      The joint is again inspected and there are no other tears, defects or loose bodies identified. The arthroscopic equipment is then removed from the inferior portals which are closed with interrupted 4-0 nylon. 20 ml of .25% Marcaine with epinephrine are injected through the inflow cannula and the cannula is then removed and the portal closed with nylon. The incisions are cleaned and dried and a bulky sterile dressing is applied. The patient is then awakened and transported to recovery in stable condition.   02/10/2015, 11:51 AM

## 2015-02-10 NOTE — Interval H&P Note (Signed)
History and Physical Interval Note:  02/10/2015 9:44 AM  Delcie Roch  has presented today for surgery, with the diagnosis of right knee medial mensical tear  The various methods of treatment have been discussed with the patient and family. After consideration of risks, benefits and other options for treatment, the patient has consented to  Procedure(s): ARTHROSCOPY RIGHT KNEE WITH DEBRIDEMENT (Right) as a surgical intervention .  The patient's history has been reviewed, patient examined, no change in status, stable for surgery.  I have reviewed the patient's chart and labs.  Questions were answered to the patient's satisfaction.     [name]

## 2015-02-10 NOTE — Anesthesia Procedure Notes (Signed)
Procedure Name: LMA Insertion Date/Time: 02/10/2015 11:20 AM Performed by: Wanita Chamberlain Pre-anesthesia Checklist: Patient identified, Timeout performed, Emergency Drugs available, Suction available and Patient being monitored Patient Re-evaluated:Patient Re-evaluated prior to inductionOxygen Delivery Method: Circle system utilized Preoxygenation: Pre-oxygenation with 100% oxygen Intubation Type: IV induction Ventilation: Mask ventilation without difficulty LMA: LMA inserted LMA Size: 5.0 Number of attempts: 1 Airway Equipment and Method: Bite block Placement Confirmation: positive ETCO2 and breath sounds checked- equal and bilateral Tube secured with: Tape Dental Injury: Teeth and Oropharynx as per pre-operative assessment

## 2015-02-10 NOTE — Transfer of Care (Signed)
Immediate Anesthesia Transfer of Care Note  Patient: Devin Combs  Procedure(s) Performed: Procedure(s): ARTHROSCOPY RIGHT KNEE WITH DEBRIDEMENT AND CHRONDROPLASTY (Right)  Patient Location: PACU  Anesthesia Type:General  Level of Consciousness: awake, alert , oriented and patient cooperative  Airway & Oxygen Therapy: Patient Spontanous Breathing and Patient connected to nasal cannula oxygen  Post-op Assessment: Report given to RN and Post -op Vital signs reviewed and stable  Post vital signs: Reviewed and stable  Last Vitals:  Filed Vitals:   02/10/15 0948  BP: 131/70  Pulse: 95  Temp: 37.3 C  Resp: 12    Complications: No apparent anesthesia complications

## 2015-02-10 NOTE — Anesthesia Preprocedure Evaluation (Addendum)
Anesthesia Evaluation  Patient identified by MRN, date of birth, ID band Patient awake    Reviewed: Allergy & Precautions, H&P , NPO status , Patient's Chart, lab work & pertinent test results  Airway Mallampati: III  TM Distance: >3 FB Neck ROM: full    Dental no notable dental hx. (+) Teeth Intact, Dental Advisory Given   Pulmonary sleep apnea ,  breath sounds clear to auscultation  Pulmonary exam normal       Cardiovascular Exercise Tolerance: Good hypertension, Pt. on medications Normal cardiovascular examRhythm:regular Rate:Normal     Neuro/Psych Cervical spinal stenosis negative neurological ROS  negative psych ROS   GI/Hepatic negative GI ROS, Neg liver ROS,   Endo/Other  diabetes, Well Controlled, Type 2, Oral Hypoglycemic Agents  Renal/GU negative Renal ROS  negative genitourinary   Musculoskeletal   Abdominal   Peds  Hematology negative hematology ROS (+)   Anesthesia Other Findings   Reproductive/Obstetrics negative OB ROS                           Anesthesia Physical Anesthesia Plan  ASA: III  Anesthesia Plan: General   Post-op Pain Management:    Induction: Intravenous  Airway Management Planned: LMA  Additional Equipment:   Intra-op Plan:   Post-operative Plan:   Informed Consent: I have reviewed the patients History and Physical, chart, labs and discussed the procedure including the risks, benefits and alternatives for the proposed anesthesia with the patient or authorized representative who has indicated his/her understanding and acceptance.   Dental Advisory Given  Plan Discussed with: CRNA and Surgeon  Anesthesia Plan Comments:         Anesthesia Quick Evaluation

## 2015-02-10 NOTE — Discharge Instructions (Signed)
Dr. Gaynelle Arabian Total Joint Specialist [facility] [address], [place], [place] [address] [phone]   Arthroscopic Procedure, Knee An arthroscopic procedure can find what is wrong with your knee. PROCEDURE Arthroscopy is a surgical technique that allows your orthopedic surgeon to diagnose and treat your knee injury with accuracy. They will look into your knee through a small instrument. This is almost like a small (pencil sized) telescope. Because arthroscopy affects your knee less than open knee surgery, you can anticipate a more rapid recovery. Taking an active role by following your caregiver's instructions will help with rapid and complete recovery. Use crutches, rest, elevation, ice, and knee exercises as instructed. The length of recovery depends on various factors including type of injury, age, physical condition, medical conditions, and your rehabilitation. Your knee is the joint between the large bones (femur and tibia) in your leg. Cartilage covers these bone ends which are smooth and slippery and allow your knee to bend and move smoothly. Two menisci, thick, semi-lunar shaped pads of cartilage which form a rim inside the joint, help absorb shock and stabilize your knee. Ligaments bind the bones together and support your knee joint. Muscles move the joint, help support your knee, and take stress off the joint itself. Because of this all programs and physical therapy to rehabilitate an injured or repaired knee require rebuilding and strengthening your muscles. AFTER THE PROCEDURE  After the procedure, you will be moved to a recovery area until most of the effects of the medication have worn off. Your caregiver will discuss the test results with you.   Only take over-the-counter or prescription medicines for pain, discomfort, or fever as directed by your caregiver.  SEEK MEDICAL CARE IF:   You have increased bleeding from your wounds.   You see  redness, swelling, or have increasing pain in your wounds.   You have pus coming from your wound.   You have an oral temperature above 102 F (38.9 C).   You notice a bad smell coming from the wound or dressing.   You have severe pain with any motion of your knee.  SEEK IMMEDIATE MEDICAL CARE IF:   You develop a rash.   You have difficulty breathing.   You have any allergic problems.  FURTHER INSTRUCTIONS:   ICE to the affected knee every three hours for 30 minutes at a time and then as needed for pain and swelling.  Continue to use ice on the knee for pain and swelling from surgery. You may notice swelling that will progress down to the foot and ankle.  This is normal after surgery.  Elevate the leg when you are not up walking on it.    DIET You may resume your previous home diet once your are discharged from the hospital.  DRESSING / WOUND CARE / SHOWERING  You may start showering two days after being discharged home but do not submerge the incisions under water.  Change dressing 48 hours after the procedure and then cover the small incisions with band aids until your follow up visit. Change the surgical dressings daily and reapply a dry dressing each time.   ACTIVITY Walk with your walker as instructed. Use walker as long as suggested by your caregivers. Avoid periods of inactivity such as sitting longer than an hour when not asleep. This helps prevent blood clots.  You may resume a sexual relationship in one month or when given the OK by your doctor.  You may return  to work once you are cleared by your doctor.  Do not drive a car for 6 weeks or until released by you surgeon.  Do not drive while taking narcotics.  WEIGHT BEARING   POSTOPERATIVE CONSTIPATION PROTOCOL Constipation - defined medically as fewer than three stools per week and severe constipation as less than one stool per week.  One of the most common issues patients have following surgery is constipation.   Even if you have a regular bowel pattern at home, your normal regimen is likely to be disrupted due to multiple reasons following surgery.  Combination of anesthesia, postoperative narcotics, change in appetite and fluid intake all can affect your bowels.  In order to avoid complications following surgery, here are some recommendations in order to help you during your recovery period.  Colace (docusate) - Pick up an over-the-counter form of Colace or another stool softener and take twice a day as long as you are requiring postoperative pain medications.  Take with a full glass of water daily.  If you experience loose stools or diarrhea, hold the colace until you stool forms back up.  If your symptoms do not get better within 1 week or if they get worse, check with your doctor.  Dulcolax (bisacodyl) - Pick up over-the-counter and take as directed by the product packaging as needed to assist with the movement of your bowels.  Take with a full glass of water.  Use this product as needed if not relieved by Colace only.   MiraLax (polyethylene glycol) - Pick up over-the-counter to have on hand.  MiraLax is a solution that will increase the amount of water in your bowels to assist with bowel movements.  Take as directed and can mix with a glass of water, juice, soda, coffee, or tea.  Take if you go more than two days without a movement. Do not use MiraLax more than once per day. Call your doctor if you are still constipated or irregular after using this medication for 7 days in a row.  If you continue to have problems with postoperative constipation, please contact the office for further assistance and recommendations.  If you experience "the worst abdominal pain ever" or develop nausea or vomiting, please contact the office immediatly for further recommendations for treatment.  ITCHING  If you experience itching with your medications, try taking only a single pain pill, or even half a pain pill at a time.   You can also use Benadryl over the counter for itching or also to help with sleep.   TED HOSE STOCKINGS Wear the elastic stockings on both legs for three weeks following surgery during the day but you may remove then at night for sleeping.  MEDICATIONS See your medication summary on the After Visit Summary that the nursing staff will review with you prior to discharge.  You may have some home medications which will be placed on hold until you complete the course of blood thinner medication.  It is important for you to complete the blood thinner medication as prescribed by your surgeon.  Continue your approved medications as instructed at time of discharge. Do not drive while taking narcotics.   PRECAUTIONS If you experience chest pain or shortness of breath - call 911 immediately for transfer to the hospital emergency department.  If you develop a fever greater that 101 F, purulent drainage from wound, increased redness or drainage from wound, foul odor from the wound/dressing, or calf pain - CONTACT YOUR SURGEON.  FOLLOW-UP APPOINTMENTS Make sure you keep all of your appointments after your operation with your surgeon and caregivers. You should call the office at [phone]  and make an appointment for approximately one week after the date of your surgery or on the date instructed by your surgeon outlined in the "After Visit Summary".  RANGE OF MOTION AND STRENGTHENING EXERCISES  Rehabilitation of the knee is important following a knee injury or an operation. After just a few days of immobilization, the muscles of the thigh which control the knee become weakened and shrink (atrophy). Knee exercises are designed to build up the tone and strength of the thigh muscles and to improve knee motion. Often times heat used for twenty to thirty minutes before working out will loosen up your tissues and help with improving the range of motion but do not  use heat for the first two weeks following surgery. These exercises can be done on a training (exercise) mat, on the floor, on a table or on a bed. Use what ever works the best and is most comfortable for you Knee exercises include:  QUAD STRENGTHENING EXERCISES Strengthening Quadriceps Sets  Tighten muscles on top of thigh by pushing knees down into floor or table. Hold for 20 seconds. Repeat 10 times. Do 2 sessions per day.     Strengthening Terminal Knee Extension  With knee bent over bolster, straighten knee by tightening muscle on top of thigh. Be sure to keep bottom of knee on bolster. Hold for 20 seconds. Repeat 10 times. Do 2 sessions per day.   Straight Leg with Bent Knee  Lie on back with opposite leg bent. Keep involved knee slightly bent at knee and raise leg 4-6". Hold for 10 seconds. Repeat 20 times per set. Do 2 sets per session. Do 2 sessions per day.  Post Anesthesia Home Care Instructions  Activity: Get plenty of rest for the remainder of the day. A responsible adult should stay with you for 24 hours following the procedure.  For the next 24 hours, DO NOT: -Drive a car -Paediatric nurse -Drink alcoholic beverages -Take any medication unless instructed by your physician -Make any legal decisions or sign important papers.  Meals: Start with liquid foods such as gelatin or soup. Progress to regular foods as tolerated. Avoid greasy, spicy, heavy foods. If nausea and/or vomiting occur, drink only clear liquids until the nausea and/or vomiting subsides. Call your physician if vomiting continues.  Special Instructions/Symptoms: Your throat may feel dry or sore from the anesthesia or the breathing tube placed in your throat during surgery. If this causes discomfort, gargle with warm salt water. The discomfort should disappear within 24 hours.  If you had a scopolamine patch placed behind your ear for the management of post- operative nausea and/or vomiting:  1.  The medication in the patch is effective for 72 hours, after which it should be removed.  Wrap patch in a tissue and discard in the trash. Wash hands thoroughly with soap and water. 2. You may remove the patch earlier than 72 hours if you experience unpleasant side effects which may include dry mouth, dizziness or visual disturbances. 3. Avoid touching the patch. Wash your hands with soap and water after contact with the patch.

## 2015-02-10 NOTE — H&P (Signed)
CC- Devin Combs is a 64 y.o. male who presents with right knee pain.  HPI- . Knee Pain: Patient presents with knee pain involving the  right knee. Onset of the symptoms was several months ago. Inciting event: none known. Current symptoms include giving out, pain located medially and stiffness. Pain is aggravated by going up and down stairs, lateral movements, pivoting, rising after sitting and walking.  Patient has had prior knee problems. Evaluation to date: MRI: abnormal medial meniscal tear. Treatment to date: rest.  Past Medical History  Diagnosis Date  . Hypertension   . Hyperlipidemia   . OSA (obstructive sleep apnea)     per pt study done 2006 (approx)  used cpap few yrs then stopped using, stated didn't feel he needed it anymore  . Type 2 diabetes mellitus   . GERD (gastroesophageal reflux disease)   . History of kidney stones   . Arthritis   . DDD (degenerative disc disease), thoracolumbar   . Degenerative cervical spinal stenosis   . Chronic back pain   . Psoriasis   . Right knee meniscal tear     Past Surgical History  Procedure Laterality Date  . Radical orchiectomy Left 1996    benign  . Correction repair multiple  toes , left foot  YRS AGO  . Removal spurs right great toe  YRS AGO  . Extracorporeal shock wave lithotripsy  2000 approx  . Knee arthroscopy Left 2007  . 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 Cataract extraction w/ intraocular lens  implant, bilateral  right 2015//  left 2012    Prior to Admission medications   Medication Sig Start Date End Date Taking? Authorizing Provider  Albiglutide (TANZEUM) 50 MG PEN Inject into the skin once a week.   Yes Historical Provider, MD  aluminum chloride (DRYSOL) 20 % external solution Apply topically at bedtime.   Yes Historical Provider, MD  amLODipine (NORVASC) 10 MG tablet Take 10 mg by mouth every morning.   Yes Historical Provider, MD  aspirin EC 325 MG tablet Take 325 mg by mouth daily.   Yes  Historical Provider, MD  Biotin 3 MG TABS Take 1 tablet by mouth daily.   Yes Historical Provider, MD  calcium carbonate (TUMS - DOSED IN MG ELEMENTAL CALCIUM) 500 MG chewable tablet Chew 1 tablet by mouth as needed for indigestion or heartburn.   Yes Historical Provider, MD  Cholecalciferol (VITAMIN D3) 50000 UNITS CAPS Take 1 capsule by mouth once a week.   Yes Historical Provider, MD  diclofenac (VOLTAREN) 75 MG EC tablet Take 75 mg by mouth 2 (two) times daily.   Yes Historical Provider, MD  DULoxetine (CYMBALTA) 60 MG capsule Take 60 mg by mouth every evening.   Yes Historical Provider, MD  fentaNYL (DURAGESIC - DOSED MCG/HR) 75 MCG/HR Place 75 mcg onto the skin every other day.   Yes Historical Provider, MD  folic acid (FOLVITE) 1 MG tablet Take 1 mg by mouth daily.   Yes Historical Provider, MD  Hydrocodone-Acetaminophen 10-300 MG TABS Take 1 tablet by mouth every 6 (six) hours as needed.   Yes Historical Provider, MD  lisinopril-hydrochlorothiazide (PRINZIDE,ZESTORETIC) 20-12.5 MG per tablet Take 2 tablets by mouth every morning.   Yes Historical Provider, MD  metFORMIN (GLUCOPHAGE) 1000 MG tablet Take 1,000 mg by mouth 2 (two) times daily with a meal.   Yes Historical Provider, MD  Multiple Vitamin (MULTIVITAMIN) tablet Take 1 tablet by mouth daily.   Yes Historical Provider, MD  Omega-3 Fatty Acids (FISH OIL) 1000 MG CAPS Take 1 capsule by mouth every evening.   Yes Historical Provider, MD  pioglitazone (ACTOS) 30 MG tablet Take 30 mg by mouth every morning.   Yes Historical Provider, MD  rosuvastatin (CRESTOR) 20 MG tablet Take 20 mg by mouth every morning.   Yes Historical Provider, MD  valACYclovir (VALTREX) 1000 MG tablet Take 1,000 mg by mouth daily as needed.   Yes Historical Provider, MD  vitamin B-12 (CYANOCOBALAMIN) 100 MCG tablet Take 100 mcg by mouth daily.   Yes Historical Provider, MD   KNEE EXAM antalgic gait, soft tissue tenderness over medial joint line, no effusion,  negative drawer sign, collateral ligaments intact  Physical Examination: General appearance - alert, well appearing, and in no distress Mental status - alert, oriented to person, place, and time Chest - clear to auscultation, no wheezes, rales or rhonchi, symmetric air entry Heart - normal rate, regular rhythm, normal S1, S2, no murmurs, rubs, clicks or gallops Abdomen - soft, nontender, nondistended, no masses or organomegaly Neurological - alert, oriented, normal speech, no focal findings or movement disorder noted    Asessment/Plan--- Right knee medial meniscal tear- - Plan right knee arthroscopy with meniscal debridement. Procedure risks and potential comps discussed with patient who elects to proceed. Goals are decreased pain and increased function with a high likelihood of achieving both

## 2015-02-10 NOTE — Anesthesia Postprocedure Evaluation (Signed)
  Anesthesia Post-op Note  Patient: Devin Combs  Procedure(s) Performed: Procedure(s) (LRB): ARTHROSCOPY RIGHT KNEE WITH DEBRIDEMENT AND CHRONDROPLASTY (Right)  Patient Location: PACU  Anesthesia Type: General  Level of Consciousness: awake and alert   Airway and Oxygen Therapy: Patient Spontanous Breathing  Post-op Pain: mild  Post-op Assessment: Post-op Vital signs reviewed, Patient's Cardiovascular Status Stable, Respiratory Function Stable, Patent Airway and No signs of Nausea or vomiting  Last Vitals:  Filed Vitals:   02/10/15 0948  BP: 131/70  Pulse: 95  Temp: 37.3 C  Resp: 12    Post-op Vital Signs: stable   Complications: No apparent anesthesia complications

## 2015-02-11 ENCOUNTER — Encounter (HOSPITAL_BASED_OUTPATIENT_CLINIC_OR_DEPARTMENT_OTHER): Payer: Self-pay | Admitting: Orthopedic Surgery

## 2015-07-05 ENCOUNTER — Ambulatory Visit (INDEPENDENT_AMBULATORY_CARE_PROVIDER_SITE_OTHER): Payer: Self-pay | Admitting: Ophthalmology

## 2015-07-30 ENCOUNTER — Other Ambulatory Visit: Payer: Self-pay | Admitting: Neurosurgery

## 2015-07-30 ENCOUNTER — Ambulatory Visit
Admission: RE | Admit: 2015-07-30 | Discharge: 2015-07-30 | Disposition: A | Discharge status: Home / Self Care | Payer: 59 | Source: Ambulatory Visit | Attending: Neurosurgery | Admitting: Neurosurgery

## 2015-07-30 DIAGNOSIS — M47816 Spondylosis without myelopathy or radiculopathy, lumbar region: Secondary | ICD-10-CM

## 2015-08-02 ENCOUNTER — Ambulatory Visit (INDEPENDENT_AMBULATORY_CARE_PROVIDER_SITE_OTHER): Payer: Self-pay | Admitting: Ophthalmology

## 2015-09-16 ENCOUNTER — Ambulatory Visit (INDEPENDENT_AMBULATORY_CARE_PROVIDER_SITE_OTHER): Payer: Self-pay | Admitting: Ophthalmology

## 2016-01-21 ENCOUNTER — Encounter: Payer: Self-pay | Admitting: Internal Medicine

## 2016-01-25 ENCOUNTER — Encounter: Payer: Self-pay | Admitting: Internal Medicine

## 2016-01-25 ENCOUNTER — Ambulatory Visit (INDEPENDENT_AMBULATORY_CARE_PROVIDER_SITE_OTHER): Payer: 59 | Admitting: Internal Medicine

## 2016-01-25 VITALS — BP 124/79 | HR 77 | Ht 73.0 in | Wt 289.4 lb

## 2016-01-25 DIAGNOSIS — R0600 Dyspnea, unspecified: Secondary | ICD-10-CM | POA: Diagnosis not present

## 2016-01-25 MED ORDER — LISINOPRIL 20 MG PO TABS: 20.0000 mg | ORAL_TABLET | Freq: Every day | ORAL | Status: DC

## 2016-01-25 NOTE — Patient Instructions (Addendum)
Medication Instructions: - Your physician has recommended you make the following change in your medication:  1) Stop amlodipine. 2) Stop HCTZ 3) Start plain lisinopril 20 mg once daily  Labwork: - none  Procedures/Testing: - Your physician has requested that you have a lexiscan myoview. For further information please visit [email or web address]. Please follow instruction sheet, as given.  - Your physician has requested that you have an echocardiogram. Echocardiography is a painless test that uses sound waves to create images of your heart. It provides your doctor with information about the size and shape of your heart and how well your heart's chambers and valves are working. This procedure takes approximately one hour. There are no restrictions for this procedure.   Follow-Up: - Your physician recommends that you schedule a follow-up appointment in: 2-3 months with Dr. Caryl Comes.  Any Additional Special Instructions Will Be Listed Below (If Applicable).     If you need a refill on your cardiac medications before your next appointment, please call your pharmacy.

## 2016-01-25 NOTE — Progress Notes (Signed)
ELECTROPHYSIOLOGY CONSULT NOTE  Patient ID: Devin Combs, MRN: [id number], DOB/AGE: 12/03/1950 65 y.o. Admit date: (Not on file) Date of Consult: 01/25/2016  Primary Physician: Vena Austria, MD Primary Cardiologist: new Consulting Physician Reade  Chief Complaint: Dizziness   HPI Devin Combs is a 65 y.o. male   referred because of dizziness that occurs with standing primarily from seated positions but sometimes from lying down. This typically abates after 10-15 seconds and is associated with pallor. He has had some presyncope. He has had no syncope.    this is gotten worse over recent months. Concurrent with that has been his blood pressure has gone from 130-40 range to 120 range.  He has some problems with dry eyes but not dry mouth. His some problems with urination but no real problems with constipation although he has some in the context of his chronic narcotics. Three is [facility] ED  He has significant dyspnea on exertion. He has considerable problems with back pain but this is largely positional as opposed to exertional. He had a cardiac evaluation number of years ago. There was some "mild cardiomyopathy". He has no known coronary disease.  He has some peripheral edema. He has sleep disordered breathing and daytime somnolence. His wife was a nurse thinks he probably has sleep apnea.   he has a significant problem with weight which is related in part to his inability to exercise because of his chronic pain.   He has depresseion and is on new medications for that.       Past Medical History  Diagnosis Date  . Hypertension   . Hyperlipidemia   . OSA (obstructive sleep apnea)     per pt study done 2006 (approx)  used cpap few yrs then stopped using, stated didn't feel he needed it anymore  . Type 2 diabetes mellitus ([place])   . GERD (gastroesophageal reflux disease)   . History of kidney stones   . Arthritis   . DDD (degenerative disc disease),  thoracolumbar   . Degenerative cervical spinal stenosis   . Chronic back pain   . Psoriasis   . Right knee meniscal tear       Surgical History:  Past Surgical History  Procedure Laterality Date  . Radical orchiectomy Left 1996    benign  . Correction repair multiple  toes , left foot  YRS AGO  . Removal spurs right great toe  YRS AGO  . Extracorporeal shock wave lithotripsy  2000 approx  . Knee arthroscopy Left 2007  . 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 Cataract extraction w/ intraocular lens  implant, bilateral  right 2015//  left 2012  . Knee arthroscopy Right 02/10/2015    Procedure: ARTHROSCOPY RIGHT KNEE WITH DEBRIDEMENT AND CHRONDROPLASTY;  Surgeon: Gaynelle Arabian, MD;  Location: [facility];  Service: Orthopedics;  Laterality: Right;     Home Meds: Prior to Admission medications   Medication Sig Start Date End Date Taking? Authorizing Provider  Albiglutide (TANZEUM) 50 MG PEN Inject into the skin once a week.   Yes Historical Provider, MD  aluminum chloride (DRYSOL) 20 % external solution Apply topically at bedtime.   Yes Historical Provider, MD  amLODipine (NORVASC) 10 MG tablet Take 10 mg by mouth every morning.   Yes Historical Provider, MD  aspirin EC 325 MG tablet Take 325 mg by mouth daily.   Yes Historical Provider, MD  Biotin 3 MG TABS Take 1 tablet by mouth daily.   Yes  Historical Provider, MD  Cholecalciferol (VITAMIN D3) 50000 UNITS CAPS Take 1 capsule by mouth once a week.   Yes Historical Provider, MD  diazepam (VALIUM) 5 MG tablet Take 5 mg by mouth every 6 (six) hours as needed. AS NEEDED FOR MUSCLE SPASMS 01/17/16  Yes Historical Provider, MD  diclofenac (VOLTAREN) 75 MG EC tablet Take 75 mg by mouth 2 (two) times daily.   Yes Historical Provider, MD  DULoxetine (CYMBALTA) 60 MG capsule Take 60 mg by mouth every evening.   Yes Historical Provider, MD  fentaNYL (DURAGESIC - DOSED MCG/HR) 75 MCG/HR Place 75 mcg onto the skin every other day.    Yes Historical Provider, MD  folic acid (FOLVITE) 1 MG tablet Take 1 mg by mouth daily.   Yes Historical Provider, MD  Hydrocodone-Acetaminophen 10-300 MG TABS Take 1 tablet by mouth every 6 (six) hours as needed.   Yes Historical Provider, MD  hyoscyamine (LEVSIN SL) 0.125 MG SL tablet Place 0.125 mg under the tongue every 4 (four) hours. FOR CRAMPING 01/19/16  Yes Historical Provider, MD  lisinopril-hydrochlorothiazide (PRINZIDE,ZESTORETIC) 20-12.5 MG per tablet Take 2 tablets by mouth every morning.   Yes Historical Provider, MD  metFORMIN (GLUCOPHAGE) 1000 MG tablet Take 1,000 mg by mouth 2 (two) times daily with a meal.   Yes Historical Provider, MD  methocarbamol (ROBAXIN) 500 MG tablet Take 1 tablet (500 mg total) by mouth 4 (four) times daily. 02/10/15  Yes Gaynelle Arabian, MD  Multiple Vitamin (MULTIVITAMIN) tablet Take 1 tablet by mouth daily.   Yes Historical Provider, MD  Omega-3 Fatty Acids (FISH OIL) 1000 MG CAPS Take 1 capsule by mouth every evening.   Yes Historical Provider, MD  omeprazole (PRILOSEC) 20 MG capsule Take 20 mg by mouth every morning. 01/10/16  Yes Historical Provider, MD  oxyCODONE (ROXICODONE) 5 MG immediate release tablet Take 1-2 tablets (5-10 mg total) by mouth every 4 (four) hours as needed for severe pain. 02/10/15  Yes Gaynelle Arabian, MD  pioglitazone (ACTOS) 30 MG tablet Take 30 mg by mouth every morning.   Yes Historical Provider, MD  rosuvastatin (CRESTOR) 20 MG tablet Take 20 mg by mouth every morning.   Yes Historical Provider, MD  valACYclovir (VALTREX) 1000 MG tablet Take 1,000 mg by mouth daily as needed.   Yes Historical Provider, MD  vitamin B-12 (CYANOCOBALAMIN) 100 MCG tablet Take 100 mcg by mouth daily.   Yes Historical Provider, MD    Allergies: No Known Allergies  Social History   Social History  . Marital Status: Married    Spouse Name: N/A  . Number of Children: N/A  . Years of Education: N/A   Occupational History  . Not on file.    Social History Main Topics  . Smoking status: Never Smoker   . Smokeless tobacco: Never Used  . Alcohol Use: Yes     Comment: rare  . Drug Use: No  . Sexual Activity: Not on file   Other Topics Concern  . Not on file   Social History Narrative     No family history on file.   ROS:  Please see the history of present illness.     All other systems reviewed and negative.    Physical Exam:   Blood pressure 124/79, pulse 77, height 6\' 1"  (1.854 m), weight 289 lb 6.4 oz (131.271 kg). General: Well developed, Morbidly obese   male in no acute distress. Head: Normocephalic, atraumatic, sclera non-icteric, no xanthomas, nares are without discharge. EENT:  normal  Lymph Nodes:  none Neck: Negative for carotid bruits. JVD 6-7 Back:without scoliosis kyphosis  Lungs: Clear bilaterally to auscultation without wheezes, rales, or rhonchi. Breathing is unlabored. Heart: RRR with S1 S2. No  murmur . No rubs, or gallops appreciated. Abdomen: Soft, non-tender, non-distended with normoactive bowel sounds. No hepatomegaly. No rebound/guarding. No obvious abdominal masses. Msk:  Strength and tone appear normal for age. Extremities: No clubbing or cyanosis. tr* edema.  Distal pedal pulses are 2+ and equal bilaterally. Skin: Warm and Dry Neuro: Alert and oriented X 3. CN III-XII intact Grossly normal sensory and motor function . Psych:  Responds to questions appropriately with a normal affect.      Labs: Cardiac Enzymes No results for input(s): CKTOTAL, CKMB, TROPONINI in the last 72 hours. CBC Lab Results  Component Value Date   WBC 7.8 09/19/2007   HGB 15.0 02/10/2015   HCT 44.0 02/10/2015   MCV 86.0 09/19/2007   PLT 223 09/19/2007   PROTIME: No results for input(s): LABPROT, INR in the last 72 hours. Chemistry No results for input(s): NA, K, CL, CO2, BUN, CREATININE, CALCIUM, PROT, BILITOT, ALKPHOS, ALT, AST, GLUCOSE in the last 168 hours.  Invalid input(s): LABALBU Lipids No  results found for: CHOL, HDL, LDLCALC, TRIG BNP No results found for: PROBNP Thyroid Function Tests: No results for input(s): TSH, T4TOTAL, T3FREE, THYROIDAB in the last 72 hours.  Invalid input(s): FREET3 Miscellaneous No results found for: DDIMER  Radiology/Studies:  No results found.  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 This occurs in the context of long-standing diabetes and hypertension. His blood pressure is relatively low at rest so discontinuing his amlodipine which may be responsible for some of his edema seems like the first appropriate step. In addition, given physiology of orthostasis, we'll discontinue his hydrochlorothiazide.  We discussed extensively the issues of dysautonomia, the physiology of orthstasis and positional stress.  We discussed the awareness of triggers and the role of ambient heat and dehydration  We discussed the importance of taking showers with cool water and potentially taking them at night.  I've also given the websites for [email or web address].  I've also suggested the use of an abdominal binder if the change in the medications are not sufficient   His dyspnea on exertion may be an anginal equivalent. He apparently has a remote history of "cardiomyopathy". We will undertake an echocardiogram to look at LV function and filling pressures and structure. Given his abnormal electrocardiogram suggestive of a prior inferior possibly posterior extended MI will undertake Myoview scan  He needs an outpatient sleep study I will defer this to Dr. Roby Lofts encouraged him in weight  loss. I suggested he discuss with Dr. Alyson Ingles with repeated be a candidate for the low-carb diet not withstanding his diabetes. I've encouraged her to exercise in the pool  As well as to pursue ongoing therapy for his secondary depression  Virl Axe

## 2016-02-10 ENCOUNTER — Telehealth (HOSPITAL_COMMUNITY): Payer: Self-pay | Admitting: *Deleted

## 2016-02-10 NOTE — Telephone Encounter (Signed)
Patient given detailed instructions per Myocardial Perfusion Study Information Sheet for the test on 02/16/16 at 1230. Patient notified to arrive 15 minutes early and that it is imperative to arrive on time for appointment to keep from having the test rescheduled.  If you need to cancel or reschedule your appointment, please call the office within 24 hours of your appointment. Failure to do so may result in a cancellation of your appointment, and a $50 no show fee. Patient verbalized understanding.Emin Foree W     

## 2016-02-16 ENCOUNTER — Ambulatory Visit (HOSPITAL_COMMUNITY): Payer: 59 | Attending: Cardiovascular Disease

## 2016-02-16 ENCOUNTER — Other Ambulatory Visit: Payer: Self-pay

## 2016-02-16 ENCOUNTER — Ambulatory Visit (HOSPITAL_BASED_OUTPATIENT_CLINIC_OR_DEPARTMENT_OTHER): Payer: 59

## 2016-02-16 DIAGNOSIS — I119 Hypertensive heart disease without heart failure: Secondary | ICD-10-CM | POA: Diagnosis not present

## 2016-02-16 DIAGNOSIS — E119 Type 2 diabetes mellitus without complications: Secondary | ICD-10-CM | POA: Insufficient documentation

## 2016-02-16 DIAGNOSIS — R0600 Dyspnea, unspecified: Secondary | ICD-10-CM | POA: Diagnosis not present

## 2016-02-16 DIAGNOSIS — E785 Hyperlipidemia, unspecified: Secondary | ICD-10-CM | POA: Diagnosis not present

## 2016-02-16 DIAGNOSIS — G4733 Obstructive sleep apnea (adult) (pediatric): Secondary | ICD-10-CM | POA: Diagnosis not present

## 2016-02-16 LAB — ECHOCARDIOGRAM COMPLETE
CHL CUP DOP CALC LVOT VTI: 23.5 cm
E decel time: 208 msec
E/e' ratio: 10.1
FS: 29 % (ref 28–44)
IV/PV OW: 1
LA diam index: 1.79 cm/m2
LA vol A4C: 57 ml
LA vol index: 23.4 mL/m2
LASIZE: 45 mm
LAVOL: 59 mL
LDCA: 4.15 cm2
LEFT ATRIUM END SYS DIAM: 45 mm
LV E/e' medial: 10.1
LV E/e'average: 10.1
LV TDI E'MEDIAL: 6.58
LVELAT: 10.5 cm/s
LVOT diameter: 23 mm
LVOT peak grad rest: 5 mmHg
LVOTPV: 107 cm/s
LVOTSV: 98 mL
MV Dec: 208
MV pk A vel: 86.1 m/s
MV pk E vel: 106 m/s
MVPG: 4 mmHg
PW: 11.4 mm — AB (ref 0.6–1.1)
Reg peak vel: 292 cm/s
TDI e' lateral: 10.5
TR max vel: 292 cm/s

## 2016-02-16 MED ORDER — REGADENOSON 0.4 MG/5ML IV SOLN
0.4000 mg | Freq: Once | INTRAVENOUS | Status: AC
  Administered 2016-02-16: 0.4 mg via INTRAVENOUS

## 2016-02-16 MED ORDER — TECHNETIUM TC 99M TETROFOSMIN IV KIT
33.0000 | PACK | Freq: Once | INTRAVENOUS | Status: AC | PRN
  Administered 2016-02-16: 33 via INTRAVENOUS
  Filled 2016-02-16: qty 33

## 2016-02-17 ENCOUNTER — Ambulatory Visit (HOSPITAL_COMMUNITY): Payer: 59 | Attending: Internal Medicine

## 2016-02-17 LAB — MYOCARDIAL PERFUSION IMAGING
CSEPPHR: 100 {beats}/min
LV sys vol: 50 mL
LVDIAVOL: 128 mL (ref 62–150)
RATE: 0.3
Rest HR: 77 {beats}/min
SDS: 2
SRS: 0
SSS: 2
TID: 1.05

## 2016-02-17 MED ORDER — TECHNETIUM TC 99M TETROFOSMIN IV KIT
32.7000 | PACK | Freq: Once | INTRAVENOUS | Status: AC | PRN
  Administered 2016-02-17: 32.7 via INTRAVENOUS
  Filled 2016-02-17: qty 33

## 2016-04-19 ENCOUNTER — Encounter: Payer: Self-pay | Admitting: Internal Medicine

## 2016-05-04 ENCOUNTER — Ambulatory Visit: Payer: 59 | Admitting: Internal Medicine

## 2016-05-17 ENCOUNTER — Other Ambulatory Visit: Payer: Self-pay | Admitting: Orthopedic Surgery

## 2016-05-17 DIAGNOSIS — M1711 Unilateral primary osteoarthritis, right knee: Secondary | ICD-10-CM

## 2016-05-23 ENCOUNTER — Other Ambulatory Visit: Payer: 59

## 2016-05-24 ENCOUNTER — Ambulatory Visit
Admission: RE | Admit: 2016-05-24 | Discharge: 2016-05-24 | Disposition: A | Discharge status: Home / Self Care | Payer: 59 | Source: Ambulatory Visit | Attending: Orthopedic Surgery | Admitting: Orthopedic Surgery

## 2016-05-24 DIAGNOSIS — M1711 Unilateral primary osteoarthritis, right knee: Secondary | ICD-10-CM

## 2016-06-09 ENCOUNTER — Ambulatory Visit: Payer: Self-pay | Admitting: Orthopedic Surgery

## 2016-07-13 ENCOUNTER — Encounter (HOSPITAL_COMMUNITY): Payer: Self-pay

## 2016-07-13 ENCOUNTER — Encounter (HOSPITAL_COMMUNITY)
Admission: RE | Admit: 2016-07-13 | Discharge: 2016-07-13 | Disposition: A | Discharge status: Home / Self Care | Payer: 59 | Source: Ambulatory Visit | Attending: Orthopedic Surgery | Admitting: Orthopedic Surgery

## 2016-07-13 DIAGNOSIS — E119 Type 2 diabetes mellitus without complications: Secondary | ICD-10-CM | POA: Diagnosis not present

## 2016-07-13 DIAGNOSIS — Z01818 Encounter for other preprocedural examination: Secondary | ICD-10-CM | POA: Diagnosis not present

## 2016-07-13 HISTORY — DX: Vitamin D deficiency, unspecified: E55.9

## 2016-07-13 HISTORY — DX: Orthostatic hypotension: I95.1

## 2016-07-13 HISTORY — DX: Headache: R51

## 2016-07-13 HISTORY — DX: Depression, unspecified: F32.A

## 2016-07-13 HISTORY — DX: Major depressive disorder, single episode, unspecified: F32.9

## 2016-07-13 HISTORY — DX: Hesitancy of micturition: R39.11

## 2016-07-13 HISTORY — DX: Personal history of other diseases of the respiratory system: Z87.09

## 2016-07-13 HISTORY — DX: Testicular hypofunction: E29.1

## 2016-07-13 HISTORY — DX: Dyspnea, unspecified: R06.00

## 2016-07-13 HISTORY — DX: Polyneuropathy, unspecified: G62.9

## 2016-07-13 HISTORY — DX: Headache, unspecified: R51.9

## 2016-07-13 HISTORY — DX: Acute myocardial infarction, unspecified: I21.9

## 2016-07-13 HISTORY — DX: Malignant (primary) neoplasm, unspecified: C80.1

## 2016-07-13 HISTORY — DX: Anxiety disorder, unspecified: F41.9

## 2016-07-13 LAB — COMPREHENSIVE METABOLIC PANEL
ALBUMIN: 4 g/dL (ref 3.5–5.0)
ALT: 35 U/L (ref 17–63)
AST: 25 U/L (ref 15–41)
Alkaline Phosphatase: 94 U/L (ref 38–126)
Anion gap: 7 (ref 5–15)
BILIRUBIN TOTAL: 0.7 mg/dL (ref 0.3–1.2)
BUN: 15 mg/dL (ref 6–20)
CHLORIDE: 104 mmol/L (ref 101–111)
CO2: 31 mmol/L (ref 22–32)
CREATININE: 0.97 mg/dL (ref 0.61–1.24)
Calcium: 9.3 mg/dL (ref 8.9–10.3)
GFR calc Af Amer: >60 mL/min (ref >60)
GFR calc non Af Amer: >60 mL/min (ref >60)
GLUCOSE: 151 mg/dL — AB (ref 65–99)
POTASSIUM: 3.8 mmol/L (ref 3.5–5.1)
Sodium: 142 mmol/L (ref 135–145)
Total Protein: 7.4 g/dL (ref 6.5–8.1)

## 2016-07-13 LAB — URINALYSIS, ROUTINE W REFLEX MICROSCOPIC
BILIRUBIN URINE: NEGATIVE
Glucose, UA: 500 mg/dL — AB
Hgb urine dipstick: NEGATIVE
KETONES UR: NEGATIVE mg/dL
Leukocytes, UA: NEGATIVE
Nitrite: NEGATIVE
PROTEIN: NEGATIVE mg/dL
Specific Gravity, Urine: 1.025 (ref 1.005–1.030)
pH: 5.5 (ref 5.0–8.0)

## 2016-07-13 LAB — GLUCOSE, CAPILLARY: Glucose-Capillary: 178 mg/dL — ABNORMAL HIGH (ref 65–99)

## 2016-07-13 LAB — CBC
HEMATOCRIT: 41.3 % (ref 39.0–52.0)
Hemoglobin: 13.3 g/dL (ref 13.0–17.0)
MCH: 28.7 pg (ref 26.0–34.0)
MCHC: 32.2 g/dL (ref 30.0–36.0)
MCV: 89 fL (ref 78.0–100.0)
Platelets: 267 10*3/uL (ref 150–400)
RBC: 4.64 MIL/uL (ref 4.22–5.81)
RDW: 14.6 % (ref 11.5–15.5)
WBC: 10.1 10*3/uL (ref 4.0–10.5)

## 2016-07-13 LAB — PROTIME-INR
INR: 1.08
Prothrombin Time: 14 seconds (ref 11.4–15.2)

## 2016-07-13 LAB — APTT: APTT: 30 s (ref 24–36)

## 2016-07-13 LAB — SURGICAL PCR SCREEN
MRSA, PCR: NEGATIVE
STAPHYLOCOCCUS AUREUS: NEGATIVE

## 2016-07-13 NOTE — Patient Instructions (Signed)
Devin Combs  07/13/2016   Your procedure is scheduled on: Wednesday July 19, 2016  Report to [facility] Main  Entrance take [place]  elevators to 3rd floor to  [place] at 9:45 AM.  Call this number if you have problems the morning of surgery [phone]   Remember: ONLY 1 PERSON MAY GO WITH YOU TO SHORT STAY TO GET  READY MORNING OF [place].  Do not eat food or drink liquids :After Midnight.     Take these medicines the morning of surgery with A SIP OF WATER: Diazepam (Valium); May take hydrocodone-acetaminophen if needed; Omeprazole                How to Manage Your Diabetes Before and After Surgery  Why is it important to control my blood sugar before and after surgery? . Improving blood sugar levels before and after surgery helps healing and can limit problems. . A way of improving blood sugar control is eating a healthy diet by: o  Eating less sugar and carbohydrates o  Increasing activity/exercise o  Talking with your doctor about reaching your blood sugar goals . High blood sugars (greater than 180 mg/dL) can raise your risk of infections and slow your recovery, so you will need to focus on controlling your diabetes during the weeks before surgery. . Make sure that the doctor who takes care of your diabetes knows about your planned surgery including the date and location.  How do I manage my blood sugar before surgery? . Check your blood sugar at least 4 times a day, starting 2 days before surgery, to make sure that the level is not too high or low. o Check your blood sugar the morning of your surgery when you wake up and every 2 hours until you get to the Short Stay unit. . If your blood sugar is less than 70 mg/dL, you will need to treat for low blood sugar: o Do not take insulin. o Treat a low blood sugar (less than 70 mg/dL) with  cup of clear juice (cranberry or apple), 4 glucose tablets, OR glucose gel. o Recheck blood sugar  in 15 minutes after treatment (to make sure it is greater than 70 mg/dL). If your blood sugar is not greater than 70 mg/dL on recheck, call [phone] for further instructions. . Report your blood sugar to the short stay nurse when you get to Short Stay.  . If you are admitted to the hospital after surgery: o Your blood sugar will be checked by the staff and you will probably be given insulin after surgery (instead of oral diabetes medicines) to make sure you have good blood sugar levels. o The goal for blood sugar control after surgery is 80-180 mg/dL.   WHAT DO I DO ABOUT MY DIABETES MEDICATION?  Marland Kitchen Do not take oral diabetes medicines (pills) the morning of surgery.   Patient Signature:  Date:   Nurse Signature:  Date:   Reviewed and Endorsed by [facility] Patient Education Committee, August 2015  DO NOT TAKE ANY DIABETIC MEDICATIONS DAY OF YOUR SURGERY                               You may not have any metal on your body including hair pins and  piercings  Do not wear jewelry,  lotions, powders or colognes, deodorant                      Men may shave face and neck.   Do not bring valuables to the hospital. [place].  Contacts, dentures or bridgework may not be worn into surgery.  Leave suitcase in the car. After surgery it may be brought to your room.                Please read over the following fact sheets you were given:MRSA INFORMATION SHEET; INCENTIVE SPIROMETER; BLOOD TRANSFUSION INFORMATION SHEET  _____________________________________________________________________             [facility] - Preparing for Surgery Before surgery, you can play an important role.  Because skin is not sterile, your skin needs to be as free of germs as possible.  You can reduce the number of germs on your skin by washing with CHG (chlorahexidine gluconate) soap before surgery.  CHG is an antiseptic cleaner which kills germs  and bonds with the skin to continue killing germs even after washing. Please DO NOT use if you have an allergy to CHG or antibacterial soaps.  If your skin becomes reddened/irritated stop using the CHG and inform your nurse when you arrive at Short Stay. Do not shave (including legs and underarms) for at least 48 hours prior to the first CHG shower.  You may shave your face/neck. Please follow these instructions carefully:  1.  Shower with CHG Soap the night before surgery and the  morning of Surgery.  2.  If you choose to wash your hair, wash your hair first as usual with your  normal  shampoo.  3.  After you shampoo, rinse your hair and body thoroughly to remove the  shampoo.                           4.  Use CHG as you would any other liquid soap.  You can apply chg directly  to the skin and wash                       Gently with a scrungie or clean washcloth.  5.  Apply the CHG Soap to your body ONLY FROM THE NECK DOWN.   Do not use on face/ open                           Wound or open sores. Avoid contact with eyes, ears mouth and genitals (private parts).                       Wash face,  Genitals (private parts) with your normal soap.             6.  Wash thoroughly, paying special attention to the area where your surgery  will be performed.  7.  Thoroughly rinse your body with warm water from the neck down.  8.  DO NOT shower/wash with your normal soap after using and rinsing off  the CHG Soap.                9.  Pat yourself dry with a clean towel.  10.  Wear clean pajamas.            11.  Place clean sheets on your bed the night of your first shower and do not  sleep with pets. Day of Surgery : Do not apply any lotions/deodorants the morning of surgery.  Please wear clean clothes to the hospital/surgery center.  FAILURE TO FOLLOW THESE INSTRUCTIONS MAY RESULT IN THE CANCELLATION OF YOUR SURGERY PATIENT SIGNATURE_________________________________  NURSE  SIGNATURE__________________________________  ________________________________________________________________________   Devin Combs  An incentive spirometer is a tool that can help keep your lungs clear and active. This tool measures how well you are filling your lungs with each breath. Taking long deep breaths may help reverse or decrease the chance of developing breathing (pulmonary) problems (especially infection) following:  A long period of time when you are unable to move or be active. BEFORE THE PROCEDURE   If the spirometer includes an indicator to show your best effort, your nurse or respiratory therapist will set it to a desired goal.  If possible, sit up straight or lean slightly forward. Try not to slouch.  Hold the incentive spirometer in an upright position. INSTRUCTIONS FOR USE  1. Sit on the edge of your bed if possible, or sit up as far as you can in bed or on a chair. 2. Hold the incentive spirometer in an upright position. 3. Breathe out normally. 4. Place the mouthpiece in your mouth and seal your lips tightly around it. 5. Breathe in slowly and as deeply as possible, raising the piston or the ball toward the top of the column. 6. Hold your breath for 3-5 seconds or for as long as possible. Allow the piston or ball to fall to the bottom of the column. 7. Remove the mouthpiece from your mouth and breathe out normally. 8. Rest for a few seconds and repeat Steps 1 through 7 at least 10 times every 1-2 hours when you are awake. Take your time and take a few normal breaths between deep breaths. 9. The spirometer may include an indicator to show your best effort. Use the indicator as a goal to work toward during each repetition. 10. After each set of 10 deep breaths, practice coughing to be sure your lungs are clear. If you have an incision (the cut made at the time of surgery), support your incision when coughing by placing a pillow or rolled up towels firmly  against it. Once you are able to get out of bed, walk around indoors and cough well. You may stop using the incentive spirometer when instructed by your caregiver.  RISKS AND COMPLICATIONS  Take your time so you do not get dizzy or light-headed.  If you are in pain, you may need to take or ask for pain medication before doing incentive spirometry. It is harder to take a deep breath if you are having pain. AFTER USE  Rest and breathe slowly and easily.  It can be helpful to keep track of a log of your progress. Your caregiver can provide you with a simple table to help with this. If you are using the spirometer at home, follow these instructions: [place] IF:   You are having difficultly using the spirometer.  You have trouble using the spirometer as often as instructed.  Your pain medication is not giving enough relief while using the spirometer.  You develop fever of 100.5 F (38.1 C) or higher. SEEK IMMEDIATE MEDICAL CARE IF:   You cough up bloody  sputum that had not been present before.  You develop fever of 102 F (38.9 C) or greater.  You develop worsening pain at or near the incision site. MAKE SURE YOU:   Understand these instructions.  Will watch your condition.  Will get help right away if you are not doing well or get worse. Document Released: 12/11/2006 Document Revised: 10/23/2011 Document Reviewed: 02/11/2007 ExitCare Patient Information 2014 Exit[employer], [place].   ________________________________________________________________________  WHAT IS A BLOOD TRANSFUSION? Blood Transfusion Information  A transfusion is the replacement of blood or some of its parts. Blood is made up of multiple cells which provide different functions.  Red blood cells carry oxygen and are used for blood loss replacement.  White blood cells fight against infection.  Platelets control bleeding.  Plasma helps clot blood.  Other blood products are available for  specialized needs, such as hemophilia or other clotting disorders. BEFORE THE TRANSFUSION  Who gives blood for transfusions?   Healthy volunteers who are fully evaluated to make sure their blood is safe. This is blood bank blood. Transfusion therapy is the safest it has ever been in the practice of medicine. Before blood is taken from a donor, a complete history is taken to make sure that person has no history of diseases nor engages in risky social behavior (examples are intravenous drug use or sexual activity with multiple partners). The donor's travel history is screened to minimize risk of transmitting infections, such as malaria. The donated blood is tested for signs of infectious diseases, such as HIV and hepatitis. The blood is then tested to be sure it is compatible with you in order to minimize the chance of a transfusion reaction. If you or a relative donates blood, this is often done in anticipation of surgery and is not appropriate for emergency situations. It takes many days to process the donated blood. RISKS AND COMPLICATIONS Although transfusion therapy is very safe and saves many lives, the main dangers of transfusion include:   Getting an infectious disease.  Developing a transfusion reaction. This is an allergic reaction to something in the blood you were given. Every precaution is taken to prevent this. The decision to have a blood transfusion has been considered carefully by your caregiver before blood is given. Blood is not given unless the benefits outweigh the risks. AFTER THE TRANSFUSION  Right after receiving a blood transfusion, you will usually feel much better and more energetic. This is especially true if your red blood cells have gotten low (anemic). The transfusion raises the level of the red blood cells which carry oxygen, and this usually causes an energy increase.  The nurse administering the transfusion will monitor you carefully for complications. HOME CARE  INSTRUCTIONS  No special instructions are needed after a transfusion. You may find your energy is better. Speak with your caregiver about any limitations on activity for underlying diseases you may have. SEEK MEDICAL CARE IF:   Your condition is not improving after your transfusion.  You develop redness or irritation at the intravenous (IV) site. SEEK IMMEDIATE MEDICAL CARE IF:  Any of the following symptoms occur over the next 12 hours:  Shaking chills.  You have a temperature by mouth above 102 F (38.9 C), not controlled by medicine.  Chest, back, or muscle pain.  People around you feel you are not acting correctly or are confused.  Shortness of breath or difficulty breathing.  Dizziness and fainting.  You get a rash or develop hives.  You have a  decrease in urine output.  Your urine turns a dark color or changes to pink, red, or brown. Any of the following symptoms occur over the next 10 days:  You have a temperature by mouth above 102 F (38.9 C), not controlled by medicine.  Shortness of breath.  Weakness after normal activity.  The white part of the eye turns yellow (jaundice).  You have a decrease in the amount of urine or are urinating less often.  Your urine turns a dark color or changes to pink, red, or brown. Document Released: 07/28/2000 Document Revised: 10/23/2011 Document Reviewed: 03/16/2008 [facility] Patient Information 2014 [place], [place].  _______________________________________________________________________

## 2016-07-13 NOTE — Progress Notes (Signed)
OV Note per chart per Dr Alyson Ingles 06/01/2016

## 2016-07-14 LAB — HEMOGLOBIN A1C
HEMOGLOBIN A1C: 6.5 % — AB (ref 4.8–5.6)
MEAN PLASMA GLUCOSE: 140 mg/dL

## 2016-07-14 NOTE — Progress Notes (Signed)
Urinalysis results in epic per PAT visit 07/13/2016 sent to Dr Wynelle Link and Arlee Muslim PA

## 2016-07-14 NOTE — Progress Notes (Signed)
Clearance note per chart per Dr Alyson Ingles 06/01/2016

## 2016-07-18 MED ORDER — DEXTROSE 5 % IV SOLN
3.0000 g | INTRAVENOUS | Status: AC
  Administered 2016-07-19: 3 g via INTRAVENOUS
  Filled 2016-07-18: qty 3

## 2016-07-19 ENCOUNTER — Observation Stay (HOSPITAL_COMMUNITY)
Admission: RE | Admit: 2016-07-19 | Discharge: 2016-07-20 | Disposition: A | Discharge status: Home / Self Care | Payer: 59 | Source: Ambulatory Visit | Attending: Orthopedic Surgery | Admitting: Orthopedic Surgery

## 2016-07-19 ENCOUNTER — Encounter (HOSPITAL_COMMUNITY)
Admission: RE | Disposition: A | Discharge status: Home / Self Care | Payer: Self-pay | Source: Ambulatory Visit | Attending: Orthopedic Surgery

## 2016-07-19 ENCOUNTER — Encounter (HOSPITAL_COMMUNITY): Payer: Self-pay | Admitting: *Deleted

## 2016-07-19 ENCOUNTER — Ambulatory Visit (HOSPITAL_COMMUNITY): Payer: 59 | Admitting: Anesthesiology

## 2016-07-19 DIAGNOSIS — M25761 Osteophyte, right knee: Secondary | ICD-10-CM | POA: Diagnosis not present

## 2016-07-19 DIAGNOSIS — Z6837 Body mass index (BMI) 37.0-37.9, adult: Secondary | ICD-10-CM | POA: Diagnosis not present

## 2016-07-19 DIAGNOSIS — M1711 Unilateral primary osteoarthritis, right knee: Secondary | ICD-10-CM | POA: Diagnosis not present

## 2016-07-19 DIAGNOSIS — M179 Osteoarthritis of knee, unspecified: Secondary | ICD-10-CM | POA: Diagnosis present

## 2016-07-19 DIAGNOSIS — I252 Old myocardial infarction: Secondary | ICD-10-CM | POA: Insufficient documentation

## 2016-07-19 DIAGNOSIS — F329 Major depressive disorder, single episode, unspecified: Secondary | ICD-10-CM | POA: Diagnosis not present

## 2016-07-19 DIAGNOSIS — Z96652 Presence of left artificial knee joint: Secondary | ICD-10-CM | POA: Insufficient documentation

## 2016-07-19 DIAGNOSIS — I10 Essential (primary) hypertension: Secondary | ICD-10-CM | POA: Diagnosis not present

## 2016-07-19 DIAGNOSIS — G4733 Obstructive sleep apnea (adult) (pediatric): Secondary | ICD-10-CM | POA: Diagnosis not present

## 2016-07-19 DIAGNOSIS — Z79891 Long term (current) use of opiate analgesic: Secondary | ICD-10-CM | POA: Insufficient documentation

## 2016-07-19 DIAGNOSIS — M1710 Unilateral primary osteoarthritis, unspecified knee: Secondary | ICD-10-CM | POA: Diagnosis present

## 2016-07-19 DIAGNOSIS — M25561 Pain in right knee: Secondary | ICD-10-CM | POA: Diagnosis present

## 2016-07-19 DIAGNOSIS — Z7982 Long term (current) use of aspirin: Secondary | ICD-10-CM | POA: Insufficient documentation

## 2016-07-19 DIAGNOSIS — E785 Hyperlipidemia, unspecified: Secondary | ICD-10-CM | POA: Diagnosis not present

## 2016-07-19 DIAGNOSIS — Z79899 Other long term (current) drug therapy: Secondary | ICD-10-CM | POA: Insufficient documentation

## 2016-07-19 DIAGNOSIS — E559 Vitamin D deficiency, unspecified: Secondary | ICD-10-CM | POA: Insufficient documentation

## 2016-07-19 DIAGNOSIS — K219 Gastro-esophageal reflux disease without esophagitis: Secondary | ICD-10-CM | POA: Diagnosis not present

## 2016-07-19 DIAGNOSIS — E119 Type 2 diabetes mellitus without complications: Secondary | ICD-10-CM | POA: Insufficient documentation

## 2016-07-19 DIAGNOSIS — Z7984 Long term (current) use of oral hypoglycemic drugs: Secondary | ICD-10-CM | POA: Diagnosis not present

## 2016-07-19 HISTORY — PX: PARTIAL KNEE ARTHROPLASTY: SHX2174

## 2016-07-19 LAB — GLUCOSE, CAPILLARY
GLUCOSE-CAPILLARY: 131 mg/dL — AB (ref 65–99)
GLUCOSE-CAPILLARY: 204 mg/dL — AB (ref 65–99)
Glucose-Capillary: 228 mg/dL — ABNORMAL HIGH (ref 65–99)

## 2016-07-19 LAB — TYPE AND SCREEN
ABO/RH(D): O POS
Antibody Screen: NEGATIVE

## 2016-07-19 SURGERY — ARTHROPLASTY, KNEE, UNICOMPARTMENTAL
Anesthesia: Monitor Anesthesia Care | Laterality: Right

## 2016-07-19 MED ORDER — PROPOFOL 500 MG/50ML IV EMUL
INTRAVENOUS | Status: DC | PRN
  Administered 2016-07-19: 75 ug/kg/min via INTRAVENOUS

## 2016-07-19 MED ORDER — ACETAMINOPHEN 10 MG/ML IV SOLN
1000.0000 mg | Freq: Once | INTRAVENOUS | Status: AC
  Administered 2016-07-19: 1000 mg via INTRAVENOUS
  Filled 2016-07-19: qty 100

## 2016-07-19 MED ORDER — CEFAZOLIN SODIUM-DEXTROSE 2-4 GM/100ML-% IV SOLN
2.0000 g | Freq: Four times a day (QID) | INTRAVENOUS | Status: AC
  Administered 2016-07-19 – 2016-07-20 (×2): 2 g via INTRAVENOUS
  Filled 2016-07-19 (×2): qty 100

## 2016-07-19 MED ORDER — SODIUM CHLORIDE 0.9 % IJ SOLN
INTRAMUSCULAR | Status: AC
  Filled 2016-07-19: qty 50

## 2016-07-19 MED ORDER — PANTOPRAZOLE SODIUM 40 MG PO TBEC: 40.0000 mg | DELAYED_RELEASE_TABLET | Freq: Every day | ORAL | Status: DC

## 2016-07-19 MED ORDER — PROPOFOL 10 MG/ML IV BOLUS
INTRAVENOUS | Status: AC
  Filled 2016-07-19: qty 60

## 2016-07-19 MED ORDER — MENTHOL 3 MG MT LOZG: 1.0000 | LOZENGE | OROMUCOSAL | Status: DC | PRN

## 2016-07-19 MED ORDER — BUPIVACAINE HCL (PF) 0.25 % IJ SOLN
INTRAMUSCULAR | Status: AC
  Filled 2016-07-19: qty 30

## 2016-07-19 MED ORDER — HYOSCYAMINE SULFATE 0.125 MG SL SUBL
0.1250 mg | SUBLINGUAL_TABLET | SUBLINGUAL | Status: DC | PRN
  Filled 2016-07-19: qty 1

## 2016-07-19 MED ORDER — ONDANSETRON HCL 4 MG PO TABS: 4.0000 mg | ORAL_TABLET | Freq: Four times a day (QID) | ORAL | Status: DC | PRN

## 2016-07-19 MED ORDER — PHENOL 1.4 % MT LIQD: 1.0000 | OROMUCOSAL | Status: DC | PRN

## 2016-07-19 MED ORDER — POLYETHYLENE GLYCOL 3350 17 G PO PACK: 17.0000 g | PACK | Freq: Every day | ORAL | Status: DC | PRN

## 2016-07-19 MED ORDER — METOCLOPRAMIDE HCL 5 MG PO TABS: 5.0000 mg | ORAL_TABLET | Freq: Three times a day (TID) | ORAL | Status: DC | PRN

## 2016-07-19 MED ORDER — ONDANSETRON HCL 4 MG/2ML IJ SOLN: 4.0000 mg | Freq: Four times a day (QID) | INTRAMUSCULAR | Status: DC | PRN

## 2016-07-19 MED ORDER — METOCLOPRAMIDE HCL 5 MG/ML IJ SOLN: 5.0000 mg | Freq: Three times a day (TID) | INTRAMUSCULAR | Status: DC | PRN

## 2016-07-19 MED ORDER — TRANEXAMIC ACID 1000 MG/10ML IV SOLN
2000.0000 mg | Freq: Once | INTRAVENOUS | Status: AC
  Administered 2016-07-19: 2000 mg via TOPICAL
  Filled 2016-07-19: qty 20

## 2016-07-19 MED ORDER — BUPIVACAINE LIPOSOME 1.3 % IJ SUSP
INTRAMUSCULAR | Status: DC | PRN
  Administered 2016-07-19: 20 mL

## 2016-07-19 MED ORDER — ACETAMINOPHEN 10 MG/ML IV SOLN
INTRAVENOUS | Status: AC
  Filled 2016-07-19: qty 100

## 2016-07-19 MED ORDER — FENTANYL CITRATE (PF) 100 MCG/2ML IJ SOLN
INTRAMUSCULAR | Status: AC
  Filled 2016-07-19: qty 2

## 2016-07-19 MED ORDER — FLEET ENEMA 7-19 GM/118ML RE ENEM: 1.0000 | ENEMA | Freq: Once | RECTAL | Status: DC | PRN

## 2016-07-19 MED ORDER — METHOCARBAMOL 1000 MG/10ML IJ SOLN
500.0000 mg | Freq: Four times a day (QID) | INTRAVENOUS | Status: DC | PRN
  Administered 2016-07-19: 500 mg via INTRAVENOUS
  Filled 2016-07-19: qty 5
  Filled 2016-07-19: qty 550

## 2016-07-19 MED ORDER — PHENYLEPHRINE 40 MCG/ML (10ML) SYRINGE FOR IV PUSH (FOR BLOOD PRESSURE SUPPORT)
PREFILLED_SYRINGE | INTRAVENOUS | Status: DC | PRN
  Administered 2016-07-19 (×2): 80 ug via INTRAVENOUS

## 2016-07-19 MED ORDER — INSULIN ASPART 100 UNIT/ML ~~LOC~~ SOLN
0.0000 [IU] | Freq: Three times a day (TID) | SUBCUTANEOUS | Status: DC
  Administered 2016-07-19: 5 [IU] via SUBCUTANEOUS
  Administered 2016-07-20: 3 [IU] via SUBCUTANEOUS
  Administered 2016-07-20: 5 [IU] via SUBCUTANEOUS

## 2016-07-19 MED ORDER — DEXAMETHASONE SODIUM PHOSPHATE 10 MG/ML IJ SOLN
10.0000 mg | Freq: Once | INTRAMUSCULAR | Status: AC
  Administered 2016-07-20: 10 mg via INTRAVENOUS
  Filled 2016-07-19: qty 1

## 2016-07-19 MED ORDER — PHENYLEPHRINE HCL 10 MG/ML IJ SOLN
INTRAMUSCULAR | Status: DC | PRN
  Administered 2016-07-19: 20 ug/min via INTRAVENOUS

## 2016-07-19 MED ORDER — RIVAROXABAN 10 MG PO TABS
10.0000 mg | ORAL_TABLET | Freq: Every day | ORAL | Status: DC
  Administered 2016-07-20: 10 mg via ORAL
  Filled 2016-07-19: qty 1

## 2016-07-19 MED ORDER — DOCUSATE SODIUM 100 MG PO CAPS
100.0000 mg | ORAL_CAPSULE | Freq: Two times a day (BID) | ORAL | Status: DC
  Administered 2016-07-19 – 2016-07-20 (×2): 100 mg via ORAL
  Filled 2016-07-19 (×2): qty 1

## 2016-07-19 MED ORDER — DULOXETINE HCL 60 MG PO CPEP
60.0000 mg | ORAL_CAPSULE | Freq: Every evening | ORAL | Status: DC
  Administered 2016-07-19: 60 mg via ORAL
  Filled 2016-07-19: qty 1

## 2016-07-19 MED ORDER — TESTOSTERONE 50 MG/5GM (1%) TD GEL: 5.0000 g | Freq: Every day | TRANSDERMAL | Status: DC

## 2016-07-19 MED ORDER — ACETAMINOPHEN 500 MG PO TABS
1000.0000 mg | ORAL_TABLET | Freq: Four times a day (QID) | ORAL | Status: AC
  Administered 2016-07-19 – 2016-07-20 (×4): 1000 mg via ORAL
  Filled 2016-07-19 (×5): qty 2

## 2016-07-19 MED ORDER — BUPIVACAINE HCL (PF) 0.25 % IJ SOLN
INTRAMUSCULAR | Status: DC | PRN
  Administered 2016-07-19: 30 mL

## 2016-07-19 MED ORDER — DEXAMETHASONE SODIUM PHOSPHATE 10 MG/ML IJ SOLN
10.0000 mg | Freq: Once | INTRAMUSCULAR | Status: AC
  Administered 2016-07-19: 10 mg via INTRAVENOUS

## 2016-07-19 MED ORDER — SODIUM CHLORIDE 0.9 % IR SOLN
Status: DC | PRN
  Administered 2016-07-19: 1000 mL

## 2016-07-19 MED ORDER — LACTATED RINGERS IV SOLN
INTRAVENOUS | Status: DC
  Administered 2016-07-19 (×3): via INTRAVENOUS

## 2016-07-19 MED ORDER — PHENYLEPHRINE 40 MCG/ML (10ML) SYRINGE FOR IV PUSH (FOR BLOOD PRESSURE SUPPORT)
PREFILLED_SYRINGE | INTRAVENOUS | Status: AC
  Filled 2016-07-19: qty 20

## 2016-07-19 MED ORDER — HYDROMORPHONE HCL 1 MG/ML IJ SOLN: 0.2500 mg | INTRAMUSCULAR | Status: DC | PRN

## 2016-07-19 MED ORDER — OXYCODONE HCL 5 MG PO TABS
5.0000 mg | ORAL_TABLET | ORAL | Status: DC | PRN
  Administered 2016-07-19 – 2016-07-20 (×3): 20 mg via ORAL
  Filled 2016-07-19 (×3): qty 4

## 2016-07-19 MED ORDER — PROMETHAZINE HCL 25 MG/ML IJ SOLN: 6.2500 mg | INTRAMUSCULAR | Status: DC | PRN

## 2016-07-19 MED ORDER — FENTANYL 25 MCG/HR TD PT72
75.0000 ug | MEDICATED_PATCH | TRANSDERMAL | Status: DC
  Administered 2016-07-20: 75 ug via TRANSDERMAL
  Filled 2016-07-19: qty 1

## 2016-07-19 MED ORDER — METHOCARBAMOL 500 MG PO TABS
500.0000 mg | ORAL_TABLET | Freq: Four times a day (QID) | ORAL | Status: DC | PRN
  Administered 2016-07-19 – 2016-07-20 (×2): 500 mg via ORAL
  Filled 2016-07-19 (×2): qty 1

## 2016-07-19 MED ORDER — SODIUM CHLORIDE 0.9 % IV SOLN
INTRAVENOUS | Status: DC
  Administered 2016-07-19: 19:00:00 via INTRAVENOUS

## 2016-07-19 MED ORDER — FENTANYL CITRATE (PF) 100 MCG/2ML IJ SOLN
INTRAMUSCULAR | Status: DC | PRN
  Administered 2016-07-19 (×2): 50 ug via INTRAVENOUS

## 2016-07-19 MED ORDER — PIOGLITAZONE HCL 30 MG PO TABS
30.0000 mg | ORAL_TABLET | Freq: Every morning | ORAL | Status: DC
  Administered 2016-07-20: 30 mg via ORAL
  Filled 2016-07-19: qty 1

## 2016-07-19 MED ORDER — ACETAMINOPHEN 650 MG RE SUPP: 650.0000 mg | Freq: Four times a day (QID) | RECTAL | Status: DC | PRN

## 2016-07-19 MED ORDER — ROSUVASTATIN CALCIUM 20 MG PO TABS
20.0000 mg | ORAL_TABLET | Freq: Every morning | ORAL | Status: DC
  Administered 2016-07-20: 20 mg via ORAL
  Filled 2016-07-19: qty 1

## 2016-07-19 MED ORDER — ACETAMINOPHEN 325 MG PO TABS: 650.0000 mg | ORAL_TABLET | Freq: Four times a day (QID) | ORAL | Status: DC | PRN

## 2016-07-19 MED ORDER — PHENYLEPHRINE HCL 10 MG/ML IJ SOLN
INTRAMUSCULAR | Status: AC
  Filled 2016-07-19: qty 1

## 2016-07-19 MED ORDER — ONDANSETRON HCL 4 MG/2ML IJ SOLN
INTRAMUSCULAR | Status: DC | PRN
  Administered 2016-07-19: 4 mg via INTRAVENOUS

## 2016-07-19 MED ORDER — BISACODYL 10 MG RE SUPP: 10.0000 mg | Freq: Every day | RECTAL | Status: DC | PRN

## 2016-07-19 MED ORDER — BUPIVACAINE IN DEXTROSE 0.75-8.25 % IT SOLN
INTRATHECAL | Status: DC | PRN
  Administered 2016-07-19: 15 mg via INTRATHECAL

## 2016-07-19 MED ORDER — BUPIVACAINE LIPOSOME 1.3 % IJ SUSP
20.0000 mL | Freq: Once | INTRAMUSCULAR | Status: DC
  Filled 2016-07-19: qty 20

## 2016-07-19 MED ORDER — MORPHINE SULFATE (PF) 2 MG/ML IV SOLN
1.0000 mg | INTRAVENOUS | Status: DC | PRN
  Administered 2016-07-19 – 2016-07-20 (×4): 1 mg via INTRAVENOUS
  Filled 2016-07-19 (×4): qty 1

## 2016-07-19 MED ORDER — MIDAZOLAM HCL 2 MG/2ML IJ SOLN
INTRAMUSCULAR | Status: AC
  Filled 2016-07-19: qty 2

## 2016-07-19 MED ORDER — DIAZEPAM 5 MG PO TABS: 5.0000 mg | ORAL_TABLET | Freq: Four times a day (QID) | ORAL | Status: DC | PRN

## 2016-07-19 MED ORDER — DIPHENHYDRAMINE HCL 12.5 MG/5ML PO ELIX: 12.5000 mg | ORAL_SOLUTION | ORAL | Status: DC | PRN

## 2016-07-19 MED ORDER — CHLORHEXIDINE GLUCONATE 4 % EX LIQD: 60.0000 mL | Freq: Once | CUTANEOUS | Status: DC

## 2016-07-19 MED ORDER — MIDAZOLAM HCL 5 MG/5ML IJ SOLN
INTRAMUSCULAR | Status: DC | PRN
  Administered 2016-07-19: 2 mg via INTRAVENOUS

## 2016-07-19 SURGICAL SUPPLY — 42 items
BAG DECANTER FOR FLEXI CONT (MISCELLANEOUS) ×2 IMPLANT
BAG ZIPLOCK 12X15 (MISCELLANEOUS) IMPLANT
BANDAGE ACE 6X5 VEL STRL LF (GAUZE/BANDAGES/DRESSINGS) ×2 IMPLANT
BLADE SAW RECIPROCATING 77.5 (BLADE) ×2 IMPLANT
BLADE SAW SGTL 13.0X1.19X90.0M (BLADE) ×2 IMPLANT
BOWL SMART MIX CTS (DISPOSABLE) ×2 IMPLANT
BUR OVAL CARBIDE 4.0 (BURR) ×2 IMPLANT
CAPT KNEE PARTIAL 2 ×1 IMPLANT
CEMENT HV SMART SET (Cement) ×2 IMPLANT
CLOTH BEACON ORANGE TIMEOUT ST (SAFETY) ×2 IMPLANT
CUFF TOURN SGL QUICK 34 (TOURNIQUET CUFF) ×1
CUFF TRNQT CYL 34X4X40X1 (TOURNIQUET CUFF) ×1 IMPLANT
DRSG ADAPTIC 3X8 NADH LF (GAUZE/BANDAGES/DRESSINGS) ×2 IMPLANT
DRSG PAD ABDOMINAL 8X10 ST (GAUZE/BANDAGES/DRESSINGS) ×2 IMPLANT
DURAPREP 26ML APPLICATOR (WOUND CARE) ×2 IMPLANT
ELECT REM PT RETURN 9FT ADLT (ELECTROSURGICAL) ×2
ELECTRODE REM PT RTRN 9FT ADLT (ELECTROSURGICAL) ×1 IMPLANT
EVACUATOR 1/8 PVC DRAIN (DRAIN) ×2 IMPLANT
GAUZE SPONGE 4X4 12PLY STRL (GAUZE/BANDAGES/DRESSINGS) ×2 IMPLANT
GLOVE BIO SURGEON STRL SZ7.5 (GLOVE) ×2 IMPLANT
GLOVE BIO SURGEON STRL SZ8 (GLOVE) ×2 IMPLANT
GLOVE BIOGEL PI IND STRL 8 (GLOVE) ×2 IMPLANT
GLOVE BIOGEL PI INDICATOR 8 (GLOVE) ×2
GOWN STRL REUS W/TWL LRG LVL3 (GOWN DISPOSABLE) ×2 IMPLANT
GOWN STRL REUS W/TWL XL LVL3 (GOWN DISPOSABLE) ×2 IMPLANT
HANDPIECE INTERPULSE COAX TIP (DISPOSABLE) ×1
IMMOBILIZER KNEE 20 (SOFTGOODS) ×2
IMMOBILIZER KNEE 20 THIGH 36 (SOFTGOODS) ×1 IMPLANT
KIT IMPL STRL TIB IPOLY IUNI ×1 IMPLANT
MANIFOLD NEPTUNE II (INSTRUMENTS) ×2 IMPLANT
PACK TOTAL KNEE CUSTOM (KITS) ×2 IMPLANT
PADDING CAST COTTON 6X4 STRL (CAST SUPPLIES) ×4 IMPLANT
POSITIONER SURGICAL ARM (MISCELLANEOUS) ×2 IMPLANT
SET HNDPC FAN SPRY TIP SCT (DISPOSABLE) ×1 IMPLANT
STRIP CLOSURE SKIN 1/2X4 (GAUZE/BANDAGES/DRESSINGS) ×2 IMPLANT
SUT MNCRL AB 4-0 PS2 18 (SUTURE) ×2 IMPLANT
SUT VIC AB 2-0 CT1 27 (SUTURE) ×2
SUT VIC AB 2-0 CT1 TAPERPNT 27 (SUTURE) ×2 IMPLANT
SUT VLOC 180 0 24IN GS25 (SUTURE) ×2 IMPLANT
SYR 50ML LL SCALE MARK (SYRINGE) ×2 IMPLANT
TRAY FOLEY W/METER SILVER 16FR (SET/KITS/TRAYS/PACK) ×2 IMPLANT
WRAP KNEE MAXI GEL POST OP (GAUZE/BANDAGES/DRESSINGS) ×2 IMPLANT

## 2016-07-19 NOTE — Anesthesia Postprocedure Evaluation (Signed)
Anesthesia Post Note  Patient: Devin Combs  Procedure(s) Performed: Procedure(s) (LRB): RIGHT KNEE MEDICAL UNICOMPARTMENTAL ARTHROPLASTY (Right)  Patient location during evaluation: PACU Anesthesia Type: Spinal and MAC Level of consciousness: awake and alert Pain management: pain level controlled Vital Signs Assessment: post-procedure vital signs reviewed and stable Respiratory status: spontaneous breathing and respiratory function stable Cardiovascular status: blood pressure returned to baseline and stable Postop Assessment: spinal receding Anesthetic complications: no    Last Vitals:  Vitals:   07/19/16 1515 07/19/16 1529  BP: 135/78 131/81  Pulse: 88 84  Resp: 14 12  Temp:  36.7 C    Last Pain:  Vitals:   07/19/16 0941  TempSrc: Oral    LLE Motor Response: Purposeful movement (muscle flicker) (99991111 A999333)   RLE Motor Response: Purposeful movement (muscle flicker) (99991111 [id number])   L Sensory Level: L3-Anterior knee, lower leg (07/19/16 1529) R Sensory Level: L3-Anterior knee, lower leg (07/19/16 1529)  Quamesha Mullet DANIEL

## 2016-07-19 NOTE — Anesthesia Preprocedure Evaluation (Addendum)
Anesthesia Evaluation  Patient identified by MRN, date of birth, ID band Patient awake    Reviewed: Allergy & Precautions, H&P , NPO status , Patient's Chart, lab work & pertinent test results  History of Anesthesia Complications Negative for: history of anesthetic complications  Airway Mallampati: III  TM Distance: >3 FB Neck ROM: full    Dental no notable dental hx. (+) Teeth Intact, Dental Advisory Given   Pulmonary sleep apnea ,    Pulmonary exam normal breath sounds clear to auscultation       Cardiovascular Exercise Tolerance: Good hypertension, Pt. on medications Normal cardiovascular exam Rhythm:regular Rate:Normal  Study Highlights    Nuclear stress EF: 61%.  There was no ST segment deviation noted during stress.  The study is normal.  This is a low risk study.  The left ventricular ejection fraction is normal (55-65%).     Study Conclusions  - Left ventricle: The cavity size was normal. Systolic function was   normal. The estimated ejection fraction was in the range of 55%   to 60%. Wall motion was normal; there were no regional wall   motion abnormalities. Features are consistent with a pseudonormal   left ventricular filling pattern, with concomitant abnormal   relaxation and increased filling pressure (grade 2 diastolic   dysfunction). - Left atrium: The atrium was moderately dilated. - Pulmonary arteries: Systolic pressure was mildly increased. PA   peak pressure: 37 mm Hg (S).   Neuro/Psych PSYCHIATRIC DISORDERS Anxiety Depression Cervical spinal stenosis negative neurological ROS     GI/Hepatic negative GI ROS, Neg liver ROS,   Endo/Other  diabetes, Well Controlled, Type 2, Oral Hypoglycemic AgentsMorbid obesity  Renal/GU negative Renal ROS  negative genitourinary   Musculoskeletal   Abdominal   Peds  Hematology negative hematology ROS (+)   Anesthesia Other Findings    Reproductive/Obstetrics negative OB ROS                           Anesthesia Physical  Anesthesia Plan  ASA: III  Anesthesia Plan: MAC and Spinal   Post-op Pain Management:    Induction: Intravenous  Airway Management Planned: Natural Airway  Additional Equipment:   Intra-op Plan:   Post-operative Plan:   Informed Consent: I have reviewed the patients History and Physical, chart, labs and discussed the procedure including the risks, benefits and alternatives for the proposed anesthesia with the patient or authorized representative who has indicated his/her understanding and acceptance.   Dental advisory given  Plan Discussed with: CRNA and Anesthesiologist  Anesthesia Plan Comments:        Anesthesia Quick Evaluation

## 2016-07-19 NOTE — H&P (Signed)
Devin Combs is an 65 y.o. male.   Chief Complaint: Right knee pain HPI: Devin Combs is a 65 yo mal with a long history of right knee pain secondary to medial compartment osteoarthritis. He has had non-operative treatment including cortisone and viscosupplement injections without benefit. X-rays show bone on bone arthritis medial compartment with normal lateral and patellofemoral compartments. He presents now for right medial unicompartmental arthroplasty.  Past Medical History:  Diagnosis Date  . Anxiety   . Arthritis   . Cancer (HCC)    basal and squamous cell carcinoma  . Chronic back pain   . DDD (degenerative disc disease), thoracolumbar   . Degenerative cervical spinal stenosis   . Depression   . Dyspnea    with exertion   . GERD (gastroesophageal reflux disease)   . Headache   . History of bronchitis   . History of kidney stones   . Hyperlipidemia   . Hypertension   . Hypogonadism in male   . Myocardial infarction   . Neuropathy ([place])   . Orthostatic hypotension   . OSA (obstructive sleep apnea)    per pt study done 2006 (approx)  used cpap few yrs then stopped using, stated didn't feel he needed it anymore  . Psoriasis   . Right knee meniscal tear   . Type 2 diabetes mellitus ([place])   . Urinary hesitancy   . Vitamin D deficiency     Past Surgical History:  Procedure Laterality Date  . CATARACT EXTRACTION W/ INTRAOCULAR LENS  IMPLANT, BILATERAL  right 2015//  left 2012  . CORRECTION REPAIR MULTIPLE  TOES , LEFT FOOT  YRS AGO  . EXTRACORPOREAL SHOCK WAVE LITHOTRIPSY  2000 approx  . KNEE ARTHROSCOPY Left 2007  . KNEE ARTHROSCOPY Right 02/10/2015   Procedure: ARTHROSCOPY RIGHT KNEE WITH DEBRIDEMENT AND CHRONDROPLASTY;  Surgeon: Gaynelle Arabian, MD;  Location: [facility];  Service: Orthopedics;  Laterality: Right;  . RADICAL ORCHIECTOMY Left 1996   benign  . REMOVAL SPURS RIGHT GREAT TOE  YRS AGO  . TOTAL KNEE ARTHROPLASTY Left 09-16-2007    History  reviewed. No pertinent family history. Social History:  reports that he has never smoked. He has never used smokeless tobacco. He reports that he drinks alcohol. He reports that he does not use drugs.  Allergies: No Known Allergies  Medications Prior to Admission  Medication Sig Dispense Refill  . Albiglutide (TANZEUM) 50 MG PEN Inject 50 mg into the skin once a week. Thursdays    . aluminum chloride (DRYSOL) 20 % external solution Apply 1 application topically every 7 (seven) days.     Marland Kitchen aspirin EC 325 MG tablet Take 325 mg by mouth daily.    . Biotin 3 MG TABS Take 1 tablet by mouth daily.    . Cholecalciferol (VITAMIN D3) 50000 UNITS CAPS Take 1 capsule by mouth once a week. Sundays    . diazepam (VALIUM) 5 MG tablet Take 5 mg by mouth every 6 (six) hours as needed. AS NEEDED FOR MUSCLE SPASMS  1  . diclofenac (VOLTAREN) 75 MG EC tablet Take 75 mg by mouth 2 (two) times daily.    . diclofenac sodium (VOLTAREN) 1 % GEL Apply 1 application topically 3 (three) times daily as needed for pain.    . DULoxetine (CYMBALTA) 60 MG capsule Take 60 mg by mouth every evening.    . fentaNYL (DURAGESIC - DOSED MCG/HR) 75 MCG/HR Place 75 mcg onto the skin every other day.    Marland Kitchen  folic acid (FOLVITE) 1 MG tablet Take 1 mg by mouth daily.    . Hydrocodone-Acetaminophen 10-300 MG TABS Take 1 tablet by mouth every 6 (six) hours as needed (pain).     . hyoscyamine (LEVSIN SL) 0.125 MG SL tablet Place 0.125 mg under the tongue every 4 (four) hours. FOR CRAMPING  5  . lisinopril (PRINIVIL,ZESTRIL) 20 MG tablet Take 1 tablet (20 mg total) by mouth daily. (Patient taking differently: Take 40 mg by mouth daily. ) 90 tablet 3  . metFORMIN (GLUCOPHAGE) 1000 MG tablet Take 1,000 mg by mouth 2 (two) times daily with a meal.    . methocarbamol (ROBAXIN) 500 MG tablet Take 1 tablet (500 mg total) by mouth 4 (four) times daily. (Patient taking differently: Take 500 mg by mouth every 6 (six) hours as needed for muscle spasms.  ) 30 tablet 1  . Multiple Vitamin (MULTIVITAMIN) tablet Take 1 tablet by mouth daily.    . Omega-3 Fatty Acids (FISH OIL) 1000 MG CAPS Take 1 capsule by mouth every evening.    Marland Kitchen omeprazole (PRILOSEC) 20 MG capsule Take 20 mg by mouth every morning.  5  . oxymetazoline (AFRIN) 0.05 % nasal spray Place 1 spray into both nostrils 2 (two) times daily as needed for congestion.    . pioglitazone (ACTOS) 30 MG tablet Take 30 mg by mouth every morning.    . rosuvastatin (CRESTOR) 20 MG tablet Take 20 mg by mouth every morning.    . testosterone (ANDROGEL) 50 MG/5GM (1%) GEL Place 5 g onto the skin daily.    . vitamin B-12 (CYANOCOBALAMIN) 100 MCG tablet Take 100 mcg by mouth daily.    Marland Kitchen oxyCODONE (ROXICODONE) 5 MG immediate release tablet Take 1-2 tablets (5-10 mg total) by mouth every 4 (four) hours as needed for severe pain. (Patient not taking: Reported on 07/10/2016) 40 tablet 0  . valACYclovir (VALTREX) 1000 MG tablet Take 1,000 mg by mouth daily as needed (outbreaks).       Results for orders placed or performed during the hospital encounter of 07/19/16 (from the past 48 hour(s))  Glucose, capillary     Status: Abnormal   Collection Time: 07/19/16  9:39 AM  Result Value Ref Range   Glucose-Capillary 228 (H) 65 - 99 mg/dL   No results found.  ROS  Blood pressure (!) 164/97, pulse (!) 114, temperature 97.9 F (36.6 C), temperature source Oral, resp. rate 18, SpO2 97 %. Physical Exam Physical Examination: General appearance - alert, well appearing, and in no distress Mental status - alert, oriented to person, place, and time Chest - clear to auscultation, no wheezes, rales or rhonchi, symmetric air entry Heart - normal rate, regular rhythm, normal S1, S2, no murmurs, rubs, clicks or gallops Abdomen - soft, nontender, nondistended, no masses or organomegaly Neurological - alert, oriented, normal speech, no focal findings or movement disorder noted Right knee- no effusion, tender medially  ROM 0-115, no instability  Assessment/Plan Right knee medial compartment arthritis- Plan unicompartmental arthroplasty medial compartment right knee. Discussed procedure, risks, potential complications and rehab course with patient, who elects to proceed.  [name], MD 07/19/2016, 12:49 PM

## 2016-07-19 NOTE — Op Note (Signed)
OPERATIVE REPORT-UNICOMPARTMENTAL ARTHROPLASTY  PREOPERATIVE DIAGNOSIS: Medial compartment osteoarthritis, Right knee  POSTOPERATIVE DIAGNOSIS: Medial compartment osteoarthritis, Right knee  PROCEDURE:Right knee medial unicompartmental arthroplasty.   SURGEON: Gaynelle Arabian, MD   ASSISTANT: Arlee Muslim, PA-C  ANESTHESIA:  Spinal.   ESTIMATED BLOOD LOSS: Minimal.   DRAINS: Hemovac x1.   TOURNIQUET TIME:  26 minutes @ [id number] mm Hg  COMPLICATIONS: None.   CONDITION: Stable to recovery.   BRIEF CLINICAL NOTE:Devin Combs is a 65 y.o. male, who has  significant isolated medial compartment arthritis of the Right knee. The patient has had nonoperative management including injections of cortisone and viscous supplements. Unfortunately, the pain persists.  Radiograph showed isolated medial compartment bone-on-bone arthritis  with normal-appearing patellofemoral and lateral compartments. The patient presents now for left knee unicompartmental arthroplasty.   PROCEDURE IN DETAIL: After successful administration of  Spinal anesthetic, a tourniquet was placed high on the  Right thigh and the Right lower extremity prepped and draped in usual sterile fashion. Extremity was wrapped in an Esmarch, knee flexed, and tourniquet inflated to 300 mmHg.       A midline incision was made with a 10 blade through subcutaneous  tissue to the extensor mechanism. A fresh blade was used to make a  medial parapatellar arthrotomy. Soft tissue on the proximal medial  tibia subperiosteally elevated to the joint line with a knife and into  the semimembranosus bursa with a Cobb elevator. The patella was  subluxed laterally, and the knee flexed 90 degrees. The ACL was intact.  The marginal osteophytes on the medial femur and tibia were removed with  a rongeur. The medial meniscus was also removed. The femoral cutting  block for the conformis unicompartmental knee system was placed along  the  femur. There was excellent fit. I traced the outline. We then  removed any remaining cartilage within this outline. We then placed the  cutting block again and pinned in position. The posterior femoral cut  was made, it was approximately 5 mm. The lug holes for the femoral  component were then drilled through the cutting block. The cutting  block was subsequently removed. We then utilized the high speed burr to  create a small trough at the superior aspect of the component tomake it inset and would not overhang the cartilage. The trial was placed,  it had excellent fit. The trial was subsequently removed.       The trial was placed again and the B chip was placed. There was  excellent balance throughout full motion. Also with excellent fit on  the tibia. This was removed as was the femoral trial. A curette was  used to remove any remaining cartilage from the tibia. The tibial  cutting block was then placed and there was a perfect fit on the tibial  surface. The appropriate slope was placed and it was pinned in  position. The reciprocating saw was used to make the central cut and  then the oscillating saw used to make the horizontal cut. The bone  fragment was then removed. The tibial trial was placed and had perfect  fit on the tibia. We then drilled the 2 lug holes and did the keel punch.  We then placed tibia trial and femur trial, and a 6 mm trial insert. There was  excellent stability throughout full range of motion and no impingement.  The trial was then removed. We drilled small holes in the distal  femur in order to create more conduits for the cement.  The cut bone  surfaces were thoroughly irrigated with pulsatile lavage while the  cement was mixed on the back table. We then cemented the tibial  component into place, impacted it and removed the extruded cement. The  same was done for the femoral component. Trial 6-mm inserts placed,  knee held in full extension, and all extruded  cement removed. While the  cement was hardening, I injected the extensor mechanism, periosteum of  the femur and subcu tissues, a total of 20 mL of Exparel mixed with 30  mL of saline and then did an additional injection of 20 mL of 0.25%  Marcaine into the same tissues. When the cement had fully hardened,  then the permanent polyethylene was placed in tibial tray. There was  excellent stability throughout full range of motion with no lift off the  component and no evidence of any impingement.       Wound was copiously irrigated with saline solution, and the arthrotomy closed over a Hemovac drain with a running #1 V-Loc suture. The subcutaneous was closed with  interrupted 2-0 Vicryl and subcuticular running 4-0 Monocryl. The drain  was hooked to suction. Incision cleaned and dried and Steri-Strips and  a bulky sterile dressing applied. The tourniquet was released after a  total time of 26 minutes. This was done after closing the extensor  mechanism. The wound was closed and a bulky sterile dressing was  applied. The operative limb was placed into a knee immobilizer, and the patient awakened and transported to recovery room in stable condition.       Please note that a surgical assistant was a medical necessity for this  procedure in order to perform it in a safe and expeditious manner.  Assistance was necessary for retracting vital ligaments, neurovascular  structures, as well as for proper positioning of the limb to allow for  appropriate bone cuts and appropriate placement of the prosthesis.    [name], MD

## 2016-07-19 NOTE — Interval H&P Note (Signed)
History and Physical Interval Note:  07/19/2016 12:53 PM  Devin Combs  has presented today for surgery, with the diagnosis of RIGHT KNEE MEDIAL COMPARTMENT OA  The various methods of treatment have been discussed with the patient and family. After consideration of risks, benefits and other options for treatment, the patient has consented to  Procedure(s): RIGHT KNEE MEDICAL UNICOMPARTMENTAL ARTHROPLASTY (Right) as a surgical intervention .  The patient's history has been reviewed, patient examined, no change in status, stable for surgery.  I have reviewed the patient's chart and labs.  Questions were answered to the patient's satisfaction.     [name]

## 2016-07-19 NOTE — Transfer of Care (Signed)
Immediate Anesthesia Transfer of Care Note  Patient: Devin Combs  Procedure(s) Performed: Procedure(s): RIGHT KNEE MEDICAL UNICOMPARTMENTAL ARTHROPLASTY (Right)  Patient Location: PACU  Anesthesia Type:Spinal  Level of Consciousness:  sedated, patient cooperative and responds to stimulation  Airway & Oxygen Therapy:Patient Spontanous Breathing and Patient connected to face mask oxgen  Post-op Assessment:  Report given to PACU RN and Post -op Vital signs reviewed and stable  Post vital signs:  Reviewed and stable  Last Vitals:  Vitals:   07/19/16 0941  BP: (!) 164/97  Pulse: (!) 114  Resp: 18  Temp: 123XX123 C    Complications: No apparent anesthesia complications

## 2016-07-19 NOTE — Anesthesia Procedure Notes (Signed)
Spinal  Patient location during procedure: OR Start time: 07/19/2016 12:51 PM End time: 07/19/2016 12:58 PM Staffing Anesthesiologist: Duane Boston Performed: anesthesiologist  Preanesthetic Checklist Completed: patient identified, surgical consent, pre-op evaluation, timeout performed, IV checked, risks and benefits discussed and monitors and equipment checked Spinal Block Patient position: sitting Prep: DuraPrep Patient monitoring: cardiac monitor, continuous pulse ox and blood pressure Approach: midline Location: L2-3 Injection technique: single-shot Needle Needle type: Pencan  Needle gauge: 24 G Needle length: 9 cm Additional Notes Functioning IV was confirmed and monitors were applied. Sterile prep and drape, including hand hygiene and sterile gloves were used. The patient was positioned and the spine was prepped. The skin was anesthetized with lidocaine.  Free flow of clear CSF was obtained prior to injecting local anesthetic into the CSF.  The spinal needle aspirated freely following injection.  The needle was carefully withdrawn.  The patient tolerated the procedure well.

## 2016-07-20 ENCOUNTER — Ambulatory Visit: Payer: 59 | Admitting: Internal Medicine

## 2016-07-20 DIAGNOSIS — M1711 Unilateral primary osteoarthritis, right knee: Secondary | ICD-10-CM | POA: Diagnosis not present

## 2016-07-20 LAB — BASIC METABOLIC PANEL
ANION GAP: 8 (ref 5–15)
BUN: 16 mg/dL (ref 6–20)
CALCIUM: 9.4 mg/dL (ref 8.9–10.3)
CHLORIDE: 100 mmol/L — AB (ref 101–111)
CO2: 29 mmol/L (ref 22–32)
CREATININE: 1.01 mg/dL (ref 0.61–1.24)
GFR calc non Af Amer: >60 mL/min (ref >60)
GLUCOSE: 227 mg/dL — AB (ref 65–99)
Potassium: 4.1 mmol/L (ref 3.5–5.1)
Sodium: 137 mmol/L (ref 135–145)

## 2016-07-20 LAB — CBC
HEMATOCRIT: 41.3 % (ref 39.0–52.0)
HEMOGLOBIN: 13.4 g/dL (ref 13.0–17.0)
MCH: 28.8 pg (ref 26.0–34.0)
MCHC: 32.4 g/dL (ref 30.0–36.0)
MCV: 88.8 fL (ref 78.0–100.0)
Platelets: 280 10*3/uL (ref 150–400)
RBC: 4.65 MIL/uL (ref 4.22–5.81)
RDW: 14.1 % (ref 11.5–15.5)
WBC: 13.9 10*3/uL — ABNORMAL HIGH (ref 4.0–10.5)

## 2016-07-20 LAB — GLUCOSE, CAPILLARY
Glucose-Capillary: 175 mg/dL — ABNORMAL HIGH (ref 65–99)
Glucose-Capillary: 180 mg/dL — ABNORMAL HIGH (ref 65–99)

## 2016-07-20 MED ORDER — OXYCODONE HCL 5 MG PO TABS: 5.0000 mg | ORAL_TABLET | ORAL | 0 refills | Status: DC | PRN

## 2016-07-20 MED ORDER — RIVAROXABAN 10 MG PO TABS: 10.0000 mg | ORAL_TABLET | Freq: Every day | ORAL | 0 refills | Status: DC

## 2016-07-20 MED ORDER — DIAZEPAM 5 MG PO TABS
5.0000 mg | ORAL_TABLET | Freq: Four times a day (QID) | ORAL | 1 refills | Status: DC | PRN
Start: 2016-07-20 — End: 2018-08-15

## 2016-07-20 MED ORDER — NON FORMULARY: 20.0000 mg | Freq: Every day | Status: DC

## 2016-07-20 MED ORDER — OMEPRAZOLE 20 MG PO CPDR
20.0000 mg | DELAYED_RELEASE_CAPSULE | Freq: Every day | ORAL | Status: DC
  Administered 2016-07-20: 20 mg via ORAL
  Filled 2016-07-20: qty 1

## 2016-07-20 NOTE — Care Management Note (Signed)
Case Management Note  Patient Details  Name: Kevork Joyce MRN: [id number] Date of Birth: [birth year]  Subjective/Objective:                  RIGHT KNEE MEDICAL UNICOMPARTMENTAL ARTHROPLASTY (Right) Action/Plan: Discharge  planning Expected Discharge Date:  07/20/17               Expected Discharge Plan:  [facility]  In-House Referral:     Discharge planning Services  CM Consult  Post Acute Care Choice:  Home Health Choice offered to:  Patient  DME Arranged:  [facility] D[facility] Agency:  [facility] Arranged:  Patient Refused [facility] Agency:  NA  Status of Service:  Completed, signed off  If discussed at [place] of Stay Meetings, dates discussed:    Additional Comments: CM met with pt in room to offer choice of home health agency.  Pt declines all [facility] services as he states he has discussed with MD to begin outpt PT at ortho office on Monday. CM notified [facility] DME rep, for Wide rolling walker to be delivered prior to discharge.  No other CM needs were communicated. Dellie Catholic, RN 07/20/2016, 12:40 PM

## 2016-07-20 NOTE — Progress Notes (Addendum)
Subjective: 1 Day Post-Op Procedure(s) (LRB): RIGHT KNEE MEDICAL UNICOMPARTMENTAL ARTHROPLASTY (Right) Patient reports pain as mild.   Patient seen in rounds by Dr. Wynelle Link. Patient is well, and has had no acute complaints or problems Patient is ready to go home today following therapy.  Objective: Vital signs in last 24 hours: Temp:  [97.3 F (36.3 C)-98.6 F (37 C)] 97.3 F (36.3 C) (12/07 0600) Pulse Rate:  [84-114] 89 (12/07 0600) Resp:  [12-18] 18 (12/07 0600) BP: (119-164)/(71-97) 140/75 (12/07 0600) SpO2:  [94 %-100 %] 98 % (12/07 0600) Weight:  [128.4 kg (283 lb 1.1 oz)] 128.4 kg (283 lb 1.1 oz) (12/07 0802)  Intake/Output from previous day:  Intake/Output Summary (Last 24 hours) at 07/20/16 0820 Last data filed at 07/20/16 0500  Gross per 24 hour  Intake             3275 ml  Output             1820 ml  Net             1455 ml    Intake/Output this shift: No intake/output data recorded.  Labs:  Recent Labs  07/20/16 0405  HGB 13.4    Recent Labs  07/20/16 0405  WBC 13.9*  RBC 4.65  HCT 41.3  PLT 280    Recent Labs  07/20/16 0405  NA 137  K 4.1  CL 100*  CO2 29  BUN 16  CREATININE 1.01  GLUCOSE 227*  CALCIUM 9.4   No results for input(s): LABPT, INR in the last 72 hours.  EXAM: General - Patient is Alert, Appropriate and Oriented Extremity - Neurovascular intact Sensation intact distally Dressing - clean, dry, no drainage Motor Function - intact, moving foot and toes well on exam.  Hemovac pulled without difficulty.  Assessment/Plan: 1 Day Post-Op Procedure(s) (LRB): RIGHT KNEE MEDICAL UNICOMPARTMENTAL ARTHROPLASTY (Right) Procedure(s) (LRB): RIGHT KNEE MEDICAL UNICOMPARTMENTAL ARTHROPLASTY (Right) Past Medical History:  Diagnosis Date  . Anxiety   . Arthritis   . Cancer (HCC)    basal and squamous cell carcinoma  . Chronic back pain   . DDD (degenerative disc disease), thoracolumbar   . Degenerative cervical spinal  stenosis   . Depression   . Dyspnea    with exertion   . GERD (gastroesophageal reflux disease)   . Headache   . History of bronchitis   . History of kidney stones   . Hyperlipidemia   . Hypertension   . Hypogonadism in male   . Myocardial infarction   . Neuropathy ([place])   . Orthostatic hypotension   . OSA (obstructive sleep apnea)    per pt study done 2006 (approx)  used cpap few yrs then stopped using, stated didn't feel he needed it anymore  . Psoriasis   . Right knee meniscal tear   . Type 2 diabetes mellitus ([place])   . Urinary hesitancy   . Vitamin D deficiency    Active Problems:   OA (osteoarthritis) of knee  Estimated body mass index is 37.35 kg/m as calculated from the following:   Height as of 07/13/16: 6\' 1"  (1.854 m).   Weight as of this encounter: 128.4 kg (283 lb 1.1 oz). Up with therapy Diet - Cardiac diet and Diabetic diet Follow up - in 2 weeks Activity - WBAT Dressing - May remove the surgical dressing tomorrow at home and then apply a dry gauze dressing daily. May shower three days following surgery but do not submerge the  incision under water. Disposition - Home Condition Upon Discharge - Good D/C Meds - See DC Summary DVT Prophylaxis Xarelto 10 mg daily for ten days, then reduce back to the Aspirin 325 mg daily at home. Arlee Muslim, PA-C Orthopaedic Surgery 07/20/2016, 8:20 AM

## 2016-07-20 NOTE — Progress Notes (Signed)
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  Reviewed stairs, don/doff KI and home therex program with pt and spouse.  Follow Up Recommendations  Outpatient PT     Equipment Recommendations  Rolling walker with 5" wheels    Recommendations for Other Services OT consult     Precautions / Restrictions Precautions Precautions: Fall;Knee Required Braces or Orthoses: Knee Immobilizer - Right Knee Immobilizer - Right: Discontinue once straight leg raise with < 10 degree lag Restrictions Weight Bearing Restrictions: No Other Position/Activity Restrictions: WBAT    Mobility  Bed Mobility Overal bed mobility: Needs Assistance Bed Mobility: Supine to Sit;Sit to Supine     Supine to sit: Min guard Sit to supine: Min guard   General bed mobility comments: cues for sequence and use of L LE to self assist  Transfers Overall transfer level: Needs assistance Equipment used: Rolling walker (2 wheeled) Transfers: Sit to/from Stand Sit to Stand: Min guard;Supervision Stand pivot transfers: Supervision       General transfer comment: Vc for hand placement and LE management  Ambulation/Gait Ambulation/Gait assistance: Min guard;Supervision Ambulation Distance (Feet): 75 Feet Assistive device: Rolling walker (2 wheeled) Gait Pattern/deviations: Step-to pattern;Decreased step length - right;Decreased step length - left;Shuffle;Trunk flexed Gait velocity: decr Gait velocity interpretation: Below normal speed for age/gender General Gait Details: cues for sequence, posture and position from RW   Stairs Stairs: Yes   Stair Management: No rails;Step to pattern;Forwards;With walker Number of Stairs: 2 General stair comments:  single step twice with cues for sequence and foot/RW placement  Wheelchair Mobility    Modified Rankin (Stroke Patients Only)       Balance Overall balance assessment: Needs assistance Sitting-balance support: No upper extremity supported;Feet supported Sitting balance-Leahy Scale: Good     Standing balance support: Bilateral upper extremity supported Standing balance-Leahy Scale: Fair                      Cognition Arousal/Alertness: Awake/alert Behavior During Therapy: WFL for tasks assessed/performed Overall Cognitive Status: Within Functional Limits for tasks assessed                      Exercises Total Joint Exercises Ankle Circles/Pumps: AROM;Both;15 reps;Supine Quad Sets: AROM;Both;10 reps;Supine Heel Slides: AAROM;Right;15 reps;Supine Hip ABduction/ADduction: AAROM;Right;10 reps;Supine Straight Leg Raises: AAROM;AROM;20 reps;Supine Long Arc Quad: AAROM;Right;5 reps;Seated Knee Flexion: AAROM;Right;5 reps;Supine    General Comments        Pertinent Vitals/Pain Pain Assessment: 0-10 Pain Score: 3  Pain Location: R knee at end of session Pain Descriptors / Indicators: Aching;Sore Pain Intervention(s): Limited activity within patient's tolerance;Monitored during session;Premedicated before session;Ice applied    Home Living Family/patient expects to be discharged to:: Private residence Living Arrangements: Spouse/significant other Available Help at Discharge: Family Type of Home: House Home Access: Stairs to enter   Home Layout: One level Home Equipment: Kasandra Knudsen - single point      Prior Function Level of Independence: Independent with assistive device(s)          PT Goals (current goals can now be found in the care plan section) Acute Rehab PT Goals Patient Stated Goal: home today or tomorrow PT Goal Formulation: With patient Time For  Goal Achievement: 07/22/16 Potential to Achieve Goals: Good Progress towards PT goals: Progressing  toward goals    Frequency    7X/week      PT Plan Current plan remains appropriate    Co-evaluation             End of Session Equipment Utilized During Treatment: Gait belt;Right knee immobilizer Activity Tolerance: Patient tolerated treatment well Patient left: in bed;with call bell/phone within reach;with family/visitor present     Time: 1345-1440 PT Time Calculation (min) (ACUTE ONLY): 55 min  Charges:  $Gait Training: 23-37 mins $Therapeutic Exercise: 23-37 mins                    G Codes:  Functional Assessment Tool Used: Clinical judgement Functional Limitation: Mobility: Walking and moving around Mobility: Walking and Moving Around Current Status [id number]): At least 20 percent but less than 40 percent impaired, limited or restricted Mobility: Walking and Moving Around Goal Status [phone]): At least 1 percent but less than 20 percent impaired, limited or restricted   Devin Combs 07/20/2016, 3:19 PM

## 2016-07-20 NOTE — Discharge Instructions (Addendum)
° °Dr. Frank Aluisio °[facility] Specialist °[facility] °[address] °[place], [place] [address] °[phone] ° °UNI KNEE REPLACEMENT POSTOPERATIVE DIRECTIONS ° ° °Knee Rehabilitation, Guidelines Following Surgery  °Results after knee surgery are often greatly improved when you follow the exercise, range of motion and muscle strengthening exercises prescribed by your doctor. Safety measures are also important to protect the knee from further injury. Any time any of these exercises cause you to have increased pain or swelling in your knee joint, decrease the amount until you are comfortable again and slowly increase them. If you have problems or questions, call your caregiver or physical therapist for advice.  ° °HOME CARE INSTRUCTIONS  °Remove items at home which could result in a fall. This includes throw rugs or furniture in walking pathways.  °· ICE to the affected knee every three hours for 30 minutes at a time and then as needed for pain and swelling.  Continue to use ice on the knee for pain and swelling from surgery. You may notice swelling that will progress down to the foot and ankle.  This is normal after surgery.  Elevate the leg when you are not up walking on it.   °· Continue to use the breathing machine which will help keep your temperature down.  It is common for your temperature to cycle up and down following surgery, especially at night when you are not up moving around and exerting yourself.  The breathing machine keeps your lungs expanded and your temperature down. °· Do not place pillow under knee, focus on keeping the knee straight while resting ° °DIET °You may resume your previous home diet once your are discharged from the hospital. ° °DRESSING / WOUND CARE / SHOWERING °You may shower 3 days after surgery, but keep the wounds dry during showering.  You may use an occlusive plastic wrap (Press'n Seal for example), NO SOAKING/SUBMERGING IN THE BATHTUB.  If the  bandage gets wet, change with a clean dry gauze.  If the incision gets wet, pat the wound dry with a clean towel. °You may start showering once you are discharged home but do not submerge the incision under water. Just pat the incision dry and apply a dry gauze dressing on daily. °Change the surgical dressing daily and reapply a dry dressing each time. ° °ACTIVITY °Walk with your walker as instructed. °Use walker as long as suggested by your caregivers. °Avoid periods of inactivity such as sitting longer than an hour when not asleep. This helps prevent blood clots.  °You may resume a sexual relationship in one month or when given the OK by your doctor.  °You may return to work once you are cleared by your doctor.  °Do not drive a car for 6 weeks or until released by you surgeon.  °Do not drive while taking narcotics. ° °WEIGHT BEARING °Weight bearing as tolerated with assist device (walker, cane, etc) as directed, use it as long as suggested by your surgeon or therapist, typically at least 4-6 weeks. ° °POSTOPERATIVE CONSTIPATION PROTOCOL °Constipation - defined medically as fewer than three stools per week and severe constipation as less than one stool per week. ° °One of the most common issues patients have following surgery is constipation.  Even if you have a regular bowel pattern at home, your normal regimen is likely to be disrupted due to multiple reasons following surgery.  Combination of anesthesia, postoperative narcotics, change in appetite and fluid intake all can affect your bowels.    In order to avoid complications following surgery, here are some recommendations in order to help you during your recovery period.  Colace (docusate) - Pick up an over-the-counter form of Colace or another stool softener and take twice a day as long as you are requiring postoperative pain medications.  Take with a full glass of water daily.  If you experience loose stools or diarrhea, hold the colace until you stool forms  back up.  If your symptoms do not get better within 1 week or if they get worse, check with your doctor.  Dulcolax (bisacodyl) - Pick up over-the-counter and take as directed by the product packaging as needed to assist with the movement of your bowels.  Take with a full glass of water.  Use this product as needed if not relieved by Colace only.   MiraLax (polyethylene glycol) - Pick up over-the-counter to have on hand.  MiraLax is a solution that will increase the amount of water in your bowels to assist with bowel movements.  Take as directed and can mix with a glass of water, juice, soda, coffee, or tea.  Take if you go more than two days without a movement. Do not use MiraLax more than once per day. Call your doctor if you are still constipated or irregular after using this medication for 7 days in a row.  If you continue to have problems with postoperative constipation, please contact the office for further assistance and recommendations.  If you experience "the worst abdominal pain ever" or develop nausea or vomiting, please contact the office immediatly for further recommendations for treatment.  ITCHING  If you experience itching with your medications, try taking only a single pain pill, or even half a pain pill at a time.  You can also use Benadryl over the counter for itching or also to help with sleep.   TED HOSE STOCKINGS Wear the elastic stockings on both legs for three weeks following surgery during the day but you may remove then at night for sleeping.  MEDICATIONS See your medication summary on the After Visit Summary that the nursing staff will review with you prior to discharge.  You may have some home medications which will be placed on hold until you complete the course of blood thinner medication.  It is important for you to complete the blood thinner medication as prescribed by your surgeon.  Continue your approved medications as instructed at time of  discharge.  PRECAUTIONS If you experience chest pain or shortness of breath - call 911 immediately for transfer to the hospital emergency department.  If you develop a fever greater that 101 F, purulent drainage from wound, increased redness or drainage from wound, foul odor from the wound/dressing, or calf pain - CONTACT YOUR SURGEON.                                                   FOLLOW-UP APPOINTMENTS Make sure you keep all of your appointments after your operation with your surgeon and caregivers. You should call the office at the above phone number and make an appointment for approximately two weeks after the date of your surgery or on the date instructed by your surgeon outlined in the "After Visit Summary".  RANGE OF MOTION AND STRENGTHENING EXERCISES  Rehabilitation of the knee is important following a knee injury or an  operation. After just a few days of immobilization, the muscles of the thigh which control the knee become weakened and shrink (atrophy). Knee exercises are designed to build up the tone and strength of the thigh muscles and to improve knee motion. Often times heat used for twenty to thirty minutes before working out will loosen up your tissues and help with improving the range of motion but do not use heat for the first two weeks following surgery. These exercises can be done on a training (exercise) mat, on the floor, on a table or on a bed. Use what ever works the best and is most comfortable for you Knee exercises include:  Leg Lifts - While your knee is still immobilized in a splint or cast, you can do straight leg raises. Lift the leg to 60 degrees, hold for 3 sec, and slowly lower the leg. Repeat 10-20 times 2-3 times daily. Perform this exercise against resistance later as your knee gets better.  Quad and Hamstring Sets - Tighten up the muscle on the front of the thigh (Quad) and hold for 5-10 sec. Repeat this 10-20 times hourly. Hamstring sets are done by pushing the  foot backward against an object and holding for 5-10 sec. Repeat as with quad sets.   Leg Slides: Lying on your back, slowly slide your foot toward your buttocks, bending your knee up off the floor (only go as far as is comfortable). Then slowly slide your foot back down until your leg is flat on the floor again.  Angel Wings: Lying on your back spread your legs to the side as far apart as you can without causing discomfort.  A rehabilitation program following serious knee injuries can speed recovery and prevent re-injury in the future due to weakened muscles. Contact your doctor or a physical therapist for more information on knee rehabilitation.   IF YOU ARE TRANSFERRED TO A SKILLED REHAB FACILITY If the patient is transferred to a skilled rehab facility following release from the hospital, a list of the current medications will be sent to the facility for the patient to continue.  When discharged from the skilled rehab facility, please have the facility set up the patient's [place] prior to being released. Also, the skilled facility will be responsible for providing the patient with their medications at time of release from the facility to include their pain medication, the muscle relaxants, and their blood thinner medication. If the patient is still at the rehab facility at time of the two week follow up appointment, the skilled rehab facility will also need to assist the patient in arranging follow up appointment in our office and any transportation needs.  MAKE SURE YOU:  Understand these instructions.  Get help right away if you are not doing well or get worse.    Pick up stool softner and laxative for home use following surgery while on pain medications. Do not submerge incision under water. Please use good hand washing techniques while changing dressing each day. May shower starting three days after surgery. Please use a clean towel to pat the incision dry following  showers. Continue to use ice for pain and swelling after surgery. Do not use any lotions or creams on the incision until instructed by your surgeon.   Take Xarelto 10 mg daily for ten days, then reduce back to the Aspirin 325 mg daily at home.  _________________________________________  Information on my medicine - XARELTO (Rivaroxaban)  This medication education was reviewed with  me or my healthcare representative as part of my discharge preparation.    Why was Xarelto prescribed for you? Xarelto was prescribed for you to reduce the risk of blood clots forming after orthopedic surgery. The medical term for these abnormal blood clots is venous thromboembolism (VTE).  What do you need to know about xarelto ? Take your Xarelto ONCE DAILY at the same time every day. You may take it either with or without food.  If you have difficulty swallowing the tablet whole, you may crush it and mix in applesauce just prior to taking your dose.  Take Xarelto exactly as prescribed by your doctor and DO NOT stop taking Xarelto without talking to the doctor who prescribed the medication.  Stopping without other VTE prevention medication to take the place of Xarelto may increase your risk of developing a clot.  After discharge, you should have regular check-up appointments with your healthcare provider that is prescribing your Xarelto.    What do you do if you miss a dose? If you miss a dose, take it as soon as you remember on the same day then continue your regularly scheduled once daily regimen the next day. Do not take two doses of Xarelto on the same day.   Important Safety Information A possible side effect of Xarelto is bleeding. You should call your healthcare provider right away if you experience any of the following: ? Bleeding from an injury or your nose that does not stop. ? Unusual colored urine (red or dark brown) or unusual colored stools (red or black). ? Unusual bruising for  unknown reasons. ? A serious fall or if you hit your head (even if there is no bleeding).  Some medicines may interact with Xarelto and might increase your risk of bleeding while on Xarelto. To help avoid this, consult your healthcare provider or pharmacist prior to using any new prescription or non-prescription medications, including herbals, vitamins, non-steroidal anti-inflammatory drugs (NSAIDs) and supplements.  This website has more information on Xarelto: [email or web address]

## 2016-07-20 NOTE — Evaluation (Signed)
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  Pt should progress to dc home with family assist and plans OP PT follow up starting 07/24/16    Follow Up Recommendations Outpatient PT    Equipment Recommendations  Rolling walker with 5" wheels    Recommendations for Other Services OT consult     Precautions / Restrictions Precautions Precautions: Fall;Knee Required Braces or Orthoses: Knee Immobilizer - Right Knee Immobilizer - Right: Discontinue once straight leg raise with < 10 degree lag Restrictions Weight Bearing Restrictions: No Other Position/Activity Restrictions: WBAT      Mobility  Bed Mobility Overal bed mobility: Needs Assistance Bed Mobility: Supine to Sit     Supine to sit: Min assist;HOB elevated     General bed mobility comments: cues for sequence and use of L LE to self assist  Transfers Overall transfer level: Needs assistance Equipment used: Rolling walker (2 wheeled) Transfers: Sit to/from Stand Sit to Stand: Min assist Stand pivot transfers: Supervision       General transfer comment: Vc for hand placement and LE management  Ambulation/Gait Ambulation/Gait assistance: Min assist Ambulation Distance (Feet): 75 Feet Assistive device: Rolling walker (2 wheeled) Gait Pattern/deviations: Step-to pattern;Decreased step length - right;Decreased step length - left;Shuffle;Trunk flexed Gait velocity: decr Gait velocity interpretation: Below normal speed for age/gender General Gait Details: cues for sequence, posture and position from [employer]            Wheelchair Mobility    Modified Rankin (Stroke Patients Only)       Balance  Overall balance assessment: Needs assistance Sitting-balance support: No upper extremity supported;Feet supported Sitting balance-Leahy Scale: Good     Standing balance support: Bilateral upper extremity supported Standing balance-Leahy Scale: Fair                               Pertinent Vitals/Pain Pain Assessment: 0-10 Pain Score: 7  Pain Location: r knee with activity Pain Descriptors / Indicators: Aching;Sore Pain Intervention(s): Limited activity within patient's tolerance;Monitored during session;Premedicated before session;Ice applied    Home Living Family/patient expects to be discharged to:: Private residence Living Arrangements: Spouse/significant other Available Help at Discharge: Family Type of Home: House Home Access: Stairs to enter   Technical brewer of Steps: 1 Home Layout: One level Home Equipment: Cane - single point      Prior Function Level of Independence: Independent with assistive device(s)               Hand Dominance        Extremity/Trunk Assessment   Upper Extremity Assessment: Overall WFL for tasks assessed           Lower Extremity Assessment: RLE deficits/detail;LLE deficits/detail RLE Deficits / Details: 2/5 quads with AAROM at knee -10 - 50 LLE Deficits / Details: generalized weakness  Cervical / Trunk Assessment: Normal  Communication   Communication: No difficulties  Cognition Arousal/Alertness: Awake/alert Behavior During Therapy: WFL for tasks assessed/performed Overall Cognitive Status: Within Functional Limits for tasks assessed                      General Comments  Exercises Total Joint Exercises Ankle Circles/Pumps: AROM;Both;15 reps;Supine Quad Sets: AROM;Both;10 reps;Supine Heel Slides: AAROM;Right;15 reps;Supine Hip ABduction/ADduction: AAROM;Right;10 reps;Supine   Assessment/Plan    PT Assessment Patient needs continued PT services  PT Problem List Decreased  strength;Decreased range of motion;Decreased activity tolerance;Decreased balance;Decreased mobility;Decreased knowledge of use of DME;Obesity;Pain          PT Treatment Interventions DME instruction;Gait training;Stair training;Functional mobility training;Therapeutic activities;Therapeutic exercise;Patient/family education    PT Goals (Current goals can be found in the Care Plan section)  Acute Rehab PT Goals Patient Stated Goal: home today or tomorrow PT Goal Formulation: With patient Time For Goal Achievement: 07/22/16 Potential to Achieve Goals: Good    Frequency 7X/week   Barriers to discharge        Co-evaluation               End of Session Equipment Utilized During Treatment: Gait belt;Right knee immobilizer Activity Tolerance: Patient tolerated treatment well Patient left: in chair;with call bell/phone within reach;with family/visitor present Nurse Communication: Mobility status    Functional Assessment Tool Used: Clinical judgement Functional Limitation: Mobility: Walking and moving around Mobility: Walking and Moving Around Current Status [id number]): At least 20 percent but less than 40 percent impaired, limited or restricted Mobility: Walking and Moving Around Goal Status [phone]): At least 1 percent but less than 20 percent impaired, limited or restricted    Time: [id number] PT Time Calculation (min) (ACUTE ONLY): 44 min   Charges:   PT Evaluation $PT Eval Low Complexity: 1 Procedure PT Treatments $Gait Training: 8-22 mins $Therapeutic Exercise: 8-22 mins   PT G Codes:   PT G-Codes **NOT FOR INPATIENT CLASS** Functional Assessment Tool Used: Clinical judgement Functional Limitation: Mobility: Walking and moving around Mobility: Walking and Moving Around Current Status [id number]): At least 20 percent but less than 40 percent impaired, limited or restricted Mobility: Walking and Moving Around Goal Status [phone]): At least 1 percent but less than 20 percent  impaired, limited or restricted    Theodor Mustin 07/20/2016, 12:59 PM

## 2016-07-20 NOTE — Evaluation (Signed)
   Occupational Therapy Evaluation Patient Details Name: Devin Combs MRN: [id number] DOB: 13-Jul-1951 Today's Date: 07-26-2016    History of Present Illness pt is s/p R uni knee   Clinical Impression   OT education complete regarding ADL activity s/p R knee surgery    Follow Up Recommendations  No OT follow up    Equipment Recommendations  None recommended by OT       Precautions / Restrictions Precautions Precautions: None      Mobility Bed Mobility               General bed mobility comments: pt in chair  Transfers Overall transfer level: Needs assistance Equipment used: Rolling walker (2 wheeled) Transfers: Sit to/from [facility] Sit to Stand: Supervision Stand pivot transfers: Supervision       General transfer comment: Vc for hand placement          ADL Overall ADL's : Needs assistance/impaired Eating/Feeding: Set up;Sitting   Grooming: Supervision/safety;Standing   Upper Body Bathing: Set up;Sitting   Lower Body Bathing: Minimal assistance;Sit to/from stand;Cueing for safety;Cueing for sequencing   Upper Body Dressing : Set up;Sitting   Lower Body Dressing: Minimal assistance;Sit to/from stand;Cueing for safety;Cueing for sequencing   Toilet Transfer: Supervision/safety;RW;Grab bars;Ambulation;Comfort height toilet   Toileting- Clothing Manipulation and Hygiene: Supervision/safety;Sit to/from stand;Cueing for safety;Cueing for sequencing         General ADL Comments: wife will A as needed               Pertinent Vitals/Pain Pain Assessment: 0-10 Pain Score: 4  Pain Location: r knee        Extremity/Trunk Assessment Upper Extremity Assessment Upper Extremity Assessment: Overall WFL for tasks assessed           Communication     Cognition Arousal/Alertness: Awake/alert Behavior During Therapy: WFL for tasks assessed/performed Overall Cognitive Status: Within Functional Limits for tasks assessed                                Home Living Family/patient expects to be discharged to:: Private residence Living Arrangements: Alone Available Help at Discharge: Family Type of Home: House             Bathroom Shower/Tub: Walk-in shower                                   OT Goals(Current goals can be found in the care plan section) Acute Rehab OT Goals Patient Stated Goal: home today or tomorrow OT Goal Formulation: With patient  OT Frequency:                End of Session Equipment Utilized During Treatment: Rolling walker Nurse Communication: Mobility status  Activity Tolerance: Patient tolerated treatment well Patient left: in chair;with call bell/phone within reach;with family/visitor present   Time: 1135-1155 OT Time Calculation (min): 20 min Charges:  OT General Charges $OT Visit: 1 Procedure OT Evaluation $OT Eval Low Complexity: 1 Procedure G-Codes:    Payton Mccallum D Jul 26, 2016, 12:38 PM

## 2016-07-20 NOTE — Discharge Summary (Signed)
Physician Discharge Summary   Patient ID: Devin Combs MRN: [id number] DOB/AGE: Apr 14, 1951 65 y.o.  Admit date: 07/19/2016 Discharge date: 07-20-2016  Primary Diagnosis:  Medial compartment osteoarthritis, Right knee  Admission Diagnoses:  Past Medical History:  Diagnosis Date  . Anxiety   . Arthritis   . Cancer (HCC)    basal and squamous cell carcinoma  . Chronic back pain   . DDD (degenerative disc disease), thoracolumbar   . Degenerative cervical spinal stenosis   . Depression   . Dyspnea    with exertion   . GERD (gastroesophageal reflux disease)   . Headache   . History of bronchitis   . History of kidney stones   . Hyperlipidemia   . Hypertension   . Hypogonadism in male   . Myocardial infarction   . Neuropathy ([place])   . Orthostatic hypotension   . OSA (obstructive sleep apnea)    per pt study done 2006 (approx)  used cpap few yrs then stopped using, stated didn't feel he needed it anymore  . Psoriasis   . Right knee meniscal tear   . Type 2 diabetes mellitus ([place])   . Urinary hesitancy   . Vitamin D deficiency    Discharge Diagnoses:   Active Problems:   OA (osteoarthritis) of knee  Estimated body mass index is 37.35 kg/m as calculated from the following:   Height as of 07/13/16: _0  (1.854 m).   Weight as of this encounter: 128.4 kg (283 lb 1.1 oz).  Procedure:  Procedure(s) (LRB): RIGHT KNEE MEDICAL UNICOMPARTMENTAL ARTHROPLASTY (Right)   Consults: None  HPI: Devin Combs is a 65 y.o. male, who has  significant isolated medial compartment arthritis of the Right knee. The patient has had nonoperative management including injections of cortisone and viscous supplements. Unfortunately, the pain persists.  Radiograph showed isolated medial compartment bone-on-bone arthritis  with normal-appearing patellofemoral and lateral compartments. The patient presents now for left knee unicompartmental arthroplasty.   Laboratory Data: Admission on  07/19/2016  Component Date Value Ref Range Status  . Glucose-Capillary 07/19/2016 228* 65 - 99 mg/dL Final  . Glucose-Capillary 07/19/2016 131* 65 - 99 mg/dL Final  . Glucose-Capillary 07/19/2016 204* 65 - 99 mg/dL Final  . WBC 07/20/2016 13.9* 4.0 - 10.5 K/uL Final  . RBC 07/20/2016 4.65  4.22 - 5.81 MIL/uL Final  . Hemoglobin 07/20/2016 13.4  13.0 - 17.0 g/dL Final  . HCT 07/20/2016 41.3  39.0 - 52.0 % Final  . MCV 07/20/2016 88.8  78.0 - 100.0 fL Final  . MCH 07/20/2016 28.8  26.0 - 34.0 pg Final  . MCHC 07/20/2016 32.4  30.0 - 36.0 g/dL Final  . RDW 07/20/2016 14.1  11.5 - 15.5 % Final  . Platelets 07/20/2016 280  150 - 400 K/uL Final  . Sodium 07/20/2016 137  135 - 145 mmol/L Final  . Potassium 07/20/2016 4.1  3.5 - 5.1 mmol/L Final  . Chloride 07/20/2016 100* 101 - 111 mmol/L Final  . CO2 07/20/2016 29  22 - 32 mmol/L Final  . Glucose, Bld 07/20/2016 227* 65 - 99 mg/dL Final  . BUN 07/20/2016 16  6 - 20 mg/dL Final  . Creatinine, Ser 07/20/2016 1.01  0.61 - 1.24 mg/dL Final  . Calcium 07/20/2016 9.4  8.9 - 10.3 mg/dL Final  . GFR calc non Af Amer 07/20/2016 >60  >60 mL/min Final  . GFR calc Af Amer 07/20/2016 >60  >60 mL/min Final   Comment: (NOTE) The eGFR has been calculated  using the CKD EPI equation. This calculation has not been validated in all clinical situations. eGFR's persistently <60 mL/min signify possible Chronic Kidney Disease.   . Anion gap 07/20/2016 8  5 - 15 Final  . Glucose-Capillary 07/20/2016 175* 65 - 99 mg/dL Final  Hospital Outpatient Visit on 07/13/2016  Component Date Value Ref Range Status  . aPTT 07/13/2016 30  24 - 36 seconds Final  . WBC 07/13/2016 10.1  4.0 - 10.5 K/uL Final  . RBC 07/13/2016 4.64  4.22 - 5.81 MIL/uL Final  . Hemoglobin 07/13/2016 13.3  13.0 - 17.0 g/dL Final  . HCT 07/13/2016 41.3  39.0 - 52.0 % Final  . MCV 07/13/2016 89.0  78.0 - 100.0 fL Final  . MCH 07/13/2016 28.7  26.0 - 34.0 pg Final  . MCHC 07/13/2016 32.2  30.0 -  36.0 g/dL Final  . RDW 07/13/2016 14.6  11.5 - 15.5 % Final  . Platelets 07/13/2016 267  150 - 400 K/uL Final  . Sodium 07/13/2016 142  135 - 145 mmol/L Final  . Potassium 07/13/2016 3.8  3.5 - 5.1 mmol/L Final  . Chloride 07/13/2016 104  101 - 111 mmol/L Final  . CO2 07/13/2016 31  22 - 32 mmol/L Final  . Glucose, Bld 07/13/2016 151* 65 - 99 mg/dL Final  . BUN 07/13/2016 15  6 - 20 mg/dL Final  . Creatinine, Ser 07/13/2016 0.97  0.61 - 1.24 mg/dL Final  . Calcium 07/13/2016 9.3  8.9 - 10.3 mg/dL Final  . Total Protein 07/13/2016 7.4  6.5 - 8.1 g/dL Final  . Albumin 07/13/2016 4.0  3.5 - 5.0 g/dL Final  . AST 07/13/2016 25  15 - 41 U/L Final  . ALT 07/13/2016 35  17 - 63 U/L Final  . Alkaline Phosphatase 07/13/2016 94  38 - 126 U/L Final  . Total Bilirubin 07/13/2016 0.7  0.3 - 1.2 mg/dL Final  . GFR calc non Af Amer 07/13/2016 >60  >60 mL/min Final  . GFR calc Af Amer 07/13/2016 >60  >60 mL/min Final   Comment: (NOTE) The eGFR has been calculated using the CKD EPI equation. This calculation has not been validated in all clinical situations. eGFR's persistently <60 mL/min signify possible Chronic Kidney Disease.   . Anion gap 07/13/2016 7  5 - 15 Final  . Prothrombin Time 07/13/2016 14.0  11.4 - 15.2 seconds Final  . INR 07/13/2016 1.08   Final  . ABO/RH(D) 07/19/2016 O POS   Final  . Antibody Screen 07/19/2016 NEG   Final  . Sample Expiration 07/19/2016 07/22/2016   Final  . Extend sample reason 07/19/2016 NO TRANSFUSIONS OR PREGNANCY IN THE PAST 3 MONTHS   Final  . Color, Urine 07/13/2016 YELLOW  YELLOW Final  . APPearance 07/13/2016 CLEAR  CLEAR Final  . Specific Gravity, Urine 07/13/2016 1.025  1.005 - 1.030 Final  . pH 07/13/2016 5.5  5.0 - 8.0 Final  . Glucose, UA 07/13/2016 500* NEGATIVE mg/dL Final  . Hgb urine dipstick 07/13/2016 NEGATIVE  NEGATIVE Final  . Bilirubin Urine 07/13/2016 NEGATIVE  NEGATIVE Final  . Ketones, ur 07/13/2016 NEGATIVE  NEGATIVE mg/dL Final  .  Protein, ur 07/13/2016 NEGATIVE  NEGATIVE mg/dL Final  . Nitrite 07/13/2016 NEGATIVE  NEGATIVE Final  . Leukocytes, UA 07/13/2016 NEGATIVE  NEGATIVE Final  . MRSA, PCR 07/13/2016 NEGATIVE  NEGATIVE Final  . Staphylococcus aureus 07/13/2016 NEGATIVE  NEGATIVE Final   Comment:        The Xpert SA Assay (  FDA approved for NASAL specimens in patients over 66 years of age), is one component of a comprehensive surveillance program.  Test performance has been validated by [facility] for patients greater than or equal to 79 year old. It is not intended to diagnose infection nor to guide or monitor treatment.   . Hgb A1c MFr Bld 07/14/2016 6.5* 4.8 - 5.6 % Final   Comment: (NOTE)         Pre-diabetes: 5.7 - 6.4         Diabetes: >6.4         Glycemic control for adults with diabetes: <7.0   . Mean Plasma Glucose 07/14/2016 140  mg/dL Final   Comment: (NOTE) Performed At: [facility] [place], [place] [id number] Lindon Romp MD [id number]   . Glucose-Capillary 07/13/2016 178* 65 - 99 mg/dL Final     X-Rays:No results found.  EKG: Orders placed or performed in visit on 01/25/16  . EKG 12-Lead     Hospital Course: Axten Pascucci is a 65 y.o. who was admitted to [facility]. They were brought to the operating room on 07/19/2016 and underwent Procedure(s): [place].  Patient tolerated the procedure well and was later transferred to the recovery room and then to the orthopaedic floor for postoperative care.  They were given PO and IV analgesics for pain control following their surgery.  They were given 24 hours of postoperative antibiotics of  Anti-infectives    Start     Dose/Rate Route Frequency Ordered Stop   07/19/16 1900  ceFAZolin (ANCEF) IVPB 2g/100 mL premix     2 g 200 mL/hr over 30 Minutes Intravenous Every 6 hours 07/19/16 1536 07/20/16 0049   07/19/16 0600  ceFAZolin (ANCEF) 3 g in dextrose 5  % 50 mL IVPB     3 g 130 mL/hr over 30 Minutes Intravenous On call to O.R. 07/18/16 1400 07/19/16 1304     and started on DVT prophylaxis in the form of Xarelto.   PT and OT were ordered for postop therapy protocol.  Discharge planning consulted to help with postop disposition and equipment needs.  Patient had a good night on the evening of surgery.  They started to get up OOB with therapy on day one. Hemovac drain was pulled without difficulty.  Patient was seen in rounds on day one and it was felt that as long as they did well with the remaining sessions of therapy that they would be ready to go home.  Arrangements were made and they were setup to go home on POD 1.  Diet - Cardiac diet and Diabetic diet Follow up - in 2 weeks Activity - WBAT Dressing - May remove the surgical dressing tomorrow at home and then apply a dry gauze dressing daily. May shower three days following surgery but do not submerge the incision under water. Disposition - Home Condition Upon Discharge - Good D/C Meds - See DC Summary DVT Prophylaxis Xarelto 10 mg daily for ten days, then reduce back to the Aspirin 325 mg daily at home.  Discharge Instructions    Call MD / Call 911    Complete by:  As directed    If you experience chest pain or shortness of breath, CALL 911 and be transported to the hospital emergency room.  If you develope a fever above 101 F, pus (white drainage) or increased drainage or redness at the wound, or calf pain, call your surgeon's office.  Change dressing    Complete by:  As directed    Change dressing daily with sterile 4 x 4 inch gauze dressing and apply TED hose. Do not submerge the incision under water.   Constipation Prevention    Complete by:  As directed    Drink plenty of fluids.  Prune juice may be helpful.  You may use a stool softener, such as Colace (over the counter) 100 mg twice a day.  Use MiraLax (over the counter) for constipation as needed.   Diet - low sodium heart  healthy    Complete by:  As directed    Diet Carb Modified    Complete by:  As directed    Discharge instructions    Complete by:  As directed    Pick up stool softner and laxative for home use following surgery while on pain medications. Do not submerge incision under water. Please use good hand washing techniques while changing dressing each day. May shower starting three days after surgery. Please use a clean towel to pat the incision dry following showers. Continue to use ice for pain and swelling after surgery. Do not use any lotions or creams on the incision until instructed by your surgeon.   Postoperative Constipation Protocol  Constipation - defined medically as fewer than three stools per week and severe constipation as less than one stool per week.  One of the most common issues patients have following surgery is constipation.  Even if you have a regular bowel pattern at home, your normal regimen is likely to be disrupted due to multiple reasons following surgery.  Combination of anesthesia, postoperative narcotics, change in appetite and fluid intake all can affect your bowels.  In order to avoid complications following surgery, here are some recommendations in order to help you during your recovery period.  Colace (docusate) - Pick up an over-the-counter form of Colace or another stool softener and take twice a day as long as you are requiring postoperative pain medications.  Take with a full glass of water daily.  If you experience loose stools or diarrhea, hold the colace until you stool forms back up.  If your symptoms do not get better within 1 week or if they get worse, check with your doctor.  Dulcolax (bisacodyl) - Pick up over-the-counter and take as directed by the product packaging as needed to assist with the movement of your bowels.  Take with a full glass of water.  Use this product as needed if not relieved by Colace only.   MiraLax (polyethylene glycol) - Pick up  over-the-counter to have on hand.  MiraLax is a solution that will increase the amount of water in your bowels to assist with bowel movements.  Take as directed and can mix with a glass of water, juice, soda, coffee, or tea.  Take if you go more than two days without a movement. Do not use MiraLax more than once per day. Call your doctor if you are still constipated or irregular after using this medication for 7 days in a row.  If you continue to have problems with postoperative constipation, please contact the office for further assistance and recommendations.  If you experience "the worst abdominal pain ever" or develop nausea or vomiting, please contact the office immediatly for further recommendations for treatment.   Xarelto 10 mg daily for ten days, then reduce back to the Aspirin 325 mg daily at home.   Do not put a pillow under the knee. Place  it under the heel.    Complete by:  As directed    Do not sit on low chairs, stoools or toilet seats, as it may be difficult to get up from low surfaces    Complete by:  As directed    Driving restrictions    Complete by:  As directed    No driving until released by the physician.   Increase activity slowly as tolerated    Complete by:  As directed    Lifting restrictions    Complete by:  As directed    No lifting until released by the physician.   Patient may shower    Complete by:  As directed    You may shower without a dressing once there is no drainage.  Do not wash over the wound.  If drainage remains, do not shower until drainage stops.   TED hose    Complete by:  As directed    Use stockings (TED hose) for 3 weeks on both leg(s).  You may remove them at night for sleeping.   Weight bearing as tolerated    Complete by:  As directed    Laterality:  right   Extremity:  Lower       Follow-up Information    [name], MD. Schedule an appointment as soon as possible for a visit on 08/01/2016.   Specialty:  Orthopedic  Surgery Contact information: [address] [place] [address] [phone]           Signed: Arlee Muslim, PA-C Orthopaedic Surgery 07/20/2016, 8:29 AM

## 2016-08-23 DIAGNOSIS — M25661 Stiffness of right knee, not elsewhere classified: Secondary | ICD-10-CM | POA: Diagnosis not present

## 2016-08-29 DIAGNOSIS — Z96651 Presence of right artificial knee joint: Secondary | ICD-10-CM | POA: Diagnosis not present

## 2016-08-29 DIAGNOSIS — Z471 Aftercare following joint replacement surgery: Secondary | ICD-10-CM | POA: Diagnosis not present

## 2016-09-01 DIAGNOSIS — Z23 Encounter for immunization: Secondary | ICD-10-CM | POA: Diagnosis not present

## 2016-09-01 DIAGNOSIS — I951 Orthostatic hypotension: Secondary | ICD-10-CM | POA: Diagnosis not present

## 2016-09-01 DIAGNOSIS — I10 Essential (primary) hypertension: Secondary | ICD-10-CM | POA: Diagnosis not present

## 2016-09-01 DIAGNOSIS — M109 Gout, unspecified: Secondary | ICD-10-CM | POA: Diagnosis not present

## 2016-09-01 DIAGNOSIS — E559 Vitamin D deficiency, unspecified: Secondary | ICD-10-CM | POA: Diagnosis not present

## 2016-09-01 DIAGNOSIS — E1141 Type 2 diabetes mellitus with diabetic mononeuropathy: Secondary | ICD-10-CM | POA: Diagnosis not present

## 2016-09-01 DIAGNOSIS — E7800 Pure hypercholesterolemia, unspecified: Secondary | ICD-10-CM | POA: Diagnosis not present

## 2016-09-01 DIAGNOSIS — L409 Psoriasis, unspecified: Secondary | ICD-10-CM | POA: Diagnosis not present

## 2016-09-01 DIAGNOSIS — Z0001 Encounter for general adult medical examination with abnormal findings: Secondary | ICD-10-CM | POA: Diagnosis not present

## 2016-09-01 DIAGNOSIS — K580 Irritable bowel syndrome with diarrhea: Secondary | ICD-10-CM | POA: Diagnosis not present

## 2016-09-01 DIAGNOSIS — K219 Gastro-esophageal reflux disease without esophagitis: Secondary | ICD-10-CM | POA: Diagnosis not present

## 2016-09-01 DIAGNOSIS — E291 Testicular hypofunction: Secondary | ICD-10-CM | POA: Diagnosis not present

## 2016-09-12 DIAGNOSIS — R197 Diarrhea, unspecified: Secondary | ICD-10-CM | POA: Diagnosis not present

## 2016-09-21 ENCOUNTER — Encounter: Payer: Self-pay | Admitting: Internal Medicine

## 2016-09-21 ENCOUNTER — Ambulatory Visit (INDEPENDENT_AMBULATORY_CARE_PROVIDER_SITE_OTHER): Payer: Medicare Other | Admitting: Internal Medicine

## 2016-09-21 VITALS — BP 152/9 | HR 105 | Ht 72.0 in | Wt 271.2 lb

## 2016-09-21 DIAGNOSIS — G909 Disorder of the autonomic nervous system, unspecified: Secondary | ICD-10-CM | POA: Diagnosis not present

## 2016-09-21 DIAGNOSIS — G901 Familial dysautonomia [Riley-Day]: Secondary | ICD-10-CM

## 2016-09-21 DIAGNOSIS — I10 Essential (primary) hypertension: Secondary | ICD-10-CM | POA: Diagnosis not present

## 2016-09-21 MED ORDER — HYDROCHLOROTHIAZIDE 12.5 MG PO CAPS: ORAL_CAPSULE | ORAL | 3 refills | Status: DC

## 2016-09-21 MED ORDER — LISINOPRIL 40 MG PO TABS: 40.0000 mg | ORAL_TABLET | Freq: Every evening | ORAL | 3 refills | Status: DC

## 2016-09-21 NOTE — Progress Notes (Signed)
Patient Care Team: Maury Dus, MD as PCP - General (Family Medicine)   HPI  Devin Combs is a 66 y.o. male Seen in follow-up for symptomatic orthostatic hypotension occurring in the context of diabetes with autonomic neuropathy.  When seen 6/17 we reviewed the physiology of autonomic dysfunction and his symptoms have largely abated. There has been no recurrent lightheadedness of any significance. He has had problems with hypertension.  There has been gradual up titration of his antihypertensives most recently with the introduction of a diuretic. His blood pressures in the 140 range  Abnormally ECG prompted Myoview  7/17  >> no ischemia and normal LV function   7/17  >> Echo  LA enlargement; LV function normal     Past Medical History:  Diagnosis Date  . Anxiety   . Arthritis   . Cancer (HCC)    basal and squamous cell carcinoma  . Chronic back pain   . DDD (degenerative disc disease), thoracolumbar   . Degenerative cervical spinal stenosis   . Depression   . Dyspnea    with exertion   . GERD (gastroesophageal reflux disease)   . Headache   . History of bronchitis   . History of kidney stones   . Hyperlipidemia   . Hypertension   . Hypogonadism in male   . Myocardial infarction   . Neuropathy ([place])   . Orthostatic hypotension   . OSA (obstructive sleep apnea)    per pt study done 2006 (approx)  used cpap few yrs then stopped using, stated didn't feel he needed it anymore  . Psoriasis   . Right knee meniscal tear   . Type 2 diabetes mellitus ([place])   . Urinary hesitancy   . Vitamin D deficiency     Past Surgical History:  Procedure Laterality Date  . CATARACT EXTRACTION W/ INTRAOCULAR LENS  IMPLANT, BILATERAL  right 2015//  left 2012  . CORRECTION REPAIR MULTIPLE  TOES , LEFT FOOT  YRS AGO  . EXTRACORPOREAL SHOCK WAVE LITHOTRIPSY  2000 approx  . KNEE ARTHROSCOPY Left 2007  . KNEE ARTHROSCOPY Right 02/10/2015   Procedure: ARTHROSCOPY RIGHT KNEE WITH  DEBRIDEMENT AND CHRONDROPLASTY;  Surgeon: Gaynelle Arabian, MD;  Location: [facility];  Service: Orthopedics;  Laterality: Right;  . PARTIAL KNEE ARTHROPLASTY Right 07/19/2016   Procedure: RIGHT KNEE MEDICAL UNICOMPARTMENTAL ARTHROPLASTY;  Surgeon: Gaynelle Arabian, MD;  Location: [facility];  Service: Orthopedics;  Laterality: Right;  . RADICAL ORCHIECTOMY Left 1996   benign  . REMOVAL SPURS RIGHT GREAT TOE  YRS AGO  . TOTAL KNEE ARTHROPLASTY Left 09-16-2007    Current Outpatient Prescriptions  Medication Sig Dispense Refill  . aluminum chloride (DRYSOL) 20 % external solution Apply 1 application topically every 7 (seven) days.     . diazepam (VALIUM) 5 MG tablet Take 1 tablet (5 mg total) by mouth every 6 (six) hours as needed. AS NEEDED FOR MUSCLE SPASMS 80 tablet 1  . DULoxetine (CYMBALTA) 60 MG capsule Take 60 mg by mouth every evening.    . fentaNYL (DURAGESIC - DOSED MCG/HR) 75 MCG/HR Place 75 mcg onto the skin every other day.    . metFORMIN (GLUCOPHAGE) 1000 MG tablet Take 1,000 mg by mouth 2 (two) times daily with a meal.    . methocarbamol (ROBAXIN) 500 MG tablet Take 1 tablet (500 mg total) by mouth 4 (four) times daily. (Patient taking differently: Take 500 mg by mouth every 6 (six) hours as needed for muscle  spasms. ) 30 tablet 1  . omeprazole (PRILOSEC) 20 MG capsule Take 20 mg by mouth every morning.  5  . oxyCODONE (OXY IR/ROXICODONE) 5 MG immediate release tablet Take 1-4 tablets (5-20 mg total) by mouth every 3 (three) hours as needed for moderate pain or severe pain. 90 tablet 0  . oxymetazoline (AFRIN) 0.05 % nasal spray Place 1 spray into both nostrils 2 (two) times daily as needed for congestion.    . pioglitazone (ACTOS) 30 MG tablet Take 30 mg by mouth every morning.    . rivaroxaban (XARELTO) 10 MG TABS tablet Take 1 tablet (10 mg total) by mouth daily with breakfast. Xarelto 10 mg daily for ten days, then reduce back to the Aspirin 325 mg daily at home. 10 tablet  0  . rosuvastatin (CRESTOR) 20 MG tablet Take 20 mg by mouth every morning.    . valACYclovir (VALTREX) 1000 MG tablet Take 1,000 mg by mouth daily as needed (outbreaks).     . hydrochlorothiazide (MICROZIDE) 12.5 MG capsule Take 1 tablet (12.5 mg total) every morning as needed for a blood pressure greater than 180. 30 capsule 3  . lisinopril (PRINIVIL,ZESTRIL) 40 MG tablet Take 1 tablet (40 mg total) by mouth every evening. 90 tablet 3   No current facility-administered medications for this visit.     No Known Allergies    Review of Systems negative except from HPI and PMH  Physical Exam BP (!) 152/9   Pulse (!) 105   Ht 6' (1.829 m)   Wt 271 lb 3.2 oz (123 kg)   SpO2 98%   BMI 36.78 kg/m  Well developed and well nourished in no acute distress HENT normal E scleral and icterus clear Neck Supple JVP flat; carotids brisk and full Clear to ausculation  *Regular rate and rhythm, no murmurs gallops or rub Soft with active bowel sounds No clubbing cyanosis  Edema Alert and oriented, grossly normal motor and sensory function Skin Warm and Dry   ECG demonstrates sinus rhythm at 91 Intervals 19/12/36 Right bundle branch block Possible inferior wall MI  Assessment and  Plan Diabetes with autonomic neuropathy  Orthostatic hypotension  Abnormal ECG >> no ischemia/infarction   Chronic pain-narcotic dependent  Sleep disordered breathing and daytime somnolence  Depression  Morbidly obese   Will cchange his lisinopril to qhs as his most severe BP wil be when recumbent  Because of issues of diuretics in dysautonomia will change dose >>12.5 and have him take it prn  BP drop today was on 20 mm    Current medicines are reviewed at length with the patient today .  The patient does not  have concerns regarding medicines but changes were made As above .

## 2016-09-21 NOTE — Patient Instructions (Signed)
Medication Instructions:    Your physician has recommended you make the following change in your medication: 1) STOP Prinizide 2) START Lisinopril 40 mg once every evening 3) START Hydrochlorothiazide 12.5 mg every morning as needed for a systolic blood pressure greater than 180 (the top number).  --- If you need a refill on your cardiac medications before your next appointment, please call your pharmacy. ---  Labwork:  None ordered  Testing/Procedures:  None ordered  Follow-Up:  Your physician wants you to follow-up in: 1 year with Dr. Caryl Comes.  You will receive a reminder letter in the mail two months in advance. If you don't receive a letter, please call our office to schedule the follow-up appointment.   Thank you for choosing [facility]!!

## 2016-09-28 DIAGNOSIS — R945 Abnormal results of liver function studies: Secondary | ICD-10-CM | POA: Diagnosis not present

## 2016-10-10 DIAGNOSIS — Z96651 Presence of right artificial knee joint: Secondary | ICD-10-CM | POA: Diagnosis not present

## 2016-10-10 DIAGNOSIS — Z471 Aftercare following joint replacement surgery: Secondary | ICD-10-CM | POA: Diagnosis not present

## 2016-10-31 DIAGNOSIS — M47812 Spondylosis without myelopathy or radiculopathy, cervical region: Secondary | ICD-10-CM | POA: Diagnosis not present

## 2016-10-31 DIAGNOSIS — G8929 Other chronic pain: Secondary | ICD-10-CM | POA: Diagnosis not present

## 2016-10-31 DIAGNOSIS — M47816 Spondylosis without myelopathy or radiculopathy, lumbar region: Secondary | ICD-10-CM | POA: Diagnosis not present

## 2016-11-15 DIAGNOSIS — E7800 Pure hypercholesterolemia, unspecified: Secondary | ICD-10-CM | POA: Diagnosis not present

## 2017-01-16 ENCOUNTER — Ambulatory Visit (HOSPITAL_COMMUNITY): Payer: 59

## 2017-02-13 DIAGNOSIS — M47816 Spondylosis without myelopathy or radiculopathy, lumbar region: Secondary | ICD-10-CM | POA: Diagnosis not present

## 2017-02-13 DIAGNOSIS — G8929 Other chronic pain: Secondary | ICD-10-CM | POA: Diagnosis not present

## 2017-03-01 DIAGNOSIS — E1141 Type 2 diabetes mellitus with diabetic mononeuropathy: Secondary | ICD-10-CM | POA: Diagnosis not present

## 2017-03-01 DIAGNOSIS — E7800 Pure hypercholesterolemia, unspecified: Secondary | ICD-10-CM | POA: Diagnosis not present

## 2017-03-01 DIAGNOSIS — E1142 Type 2 diabetes mellitus with diabetic polyneuropathy: Secondary | ICD-10-CM | POA: Diagnosis not present

## 2017-03-01 DIAGNOSIS — K219 Gastro-esophageal reflux disease without esophagitis: Secondary | ICD-10-CM | POA: Diagnosis not present

## 2017-03-01 DIAGNOSIS — E291 Testicular hypofunction: Secondary | ICD-10-CM | POA: Diagnosis not present

## 2017-03-01 DIAGNOSIS — I10 Essential (primary) hypertension: Secondary | ICD-10-CM | POA: Diagnosis not present

## 2017-03-01 DIAGNOSIS — G4733 Obstructive sleep apnea (adult) (pediatric): Secondary | ICD-10-CM | POA: Diagnosis not present

## 2017-03-01 DIAGNOSIS — Z6835 Body mass index (BMI) 35.0-35.9, adult: Secondary | ICD-10-CM | POA: Diagnosis not present

## 2017-03-01 DIAGNOSIS — M109 Gout, unspecified: Secondary | ICD-10-CM | POA: Diagnosis not present

## 2017-03-01 DIAGNOSIS — K580 Irritable bowel syndrome with diarrhea: Secondary | ICD-10-CM | POA: Diagnosis not present

## 2017-03-01 DIAGNOSIS — I951 Orthostatic hypotension: Secondary | ICD-10-CM | POA: Diagnosis not present

## 2017-03-08 DIAGNOSIS — I10 Essential (primary) hypertension: Secondary | ICD-10-CM | POA: Diagnosis not present

## 2017-03-08 DIAGNOSIS — E291 Testicular hypofunction: Secondary | ICD-10-CM | POA: Diagnosis not present

## 2017-03-08 DIAGNOSIS — E7800 Pure hypercholesterolemia, unspecified: Secondary | ICD-10-CM | POA: Diagnosis not present

## 2017-03-12 ENCOUNTER — Other Ambulatory Visit: Payer: Self-pay | Admitting: Internal Medicine

## 2017-05-02 ENCOUNTER — Ambulatory Visit (INDEPENDENT_AMBULATORY_CARE_PROVIDER_SITE_OTHER): Payer: Medicare Other | Admitting: Neurology

## 2017-05-02 ENCOUNTER — Encounter: Payer: Self-pay | Admitting: Neurology

## 2017-05-02 VITALS — BP 165/102 | HR 104 | Ht 72.0 in | Wt 264.8 lb

## 2017-05-02 DIAGNOSIS — R55 Syncope and collapse: Secondary | ICD-10-CM

## 2017-05-02 DIAGNOSIS — Z9181 History of falling: Secondary | ICD-10-CM

## 2017-05-02 DIAGNOSIS — R51 Headache: Secondary | ICD-10-CM

## 2017-05-02 DIAGNOSIS — R2689 Other abnormalities of gait and mobility: Secondary | ICD-10-CM | POA: Diagnosis not present

## 2017-05-02 DIAGNOSIS — M4722 Other spondylosis with radiculopathy, cervical region: Secondary | ICD-10-CM

## 2017-05-02 DIAGNOSIS — R42 Dizziness and giddiness: Secondary | ICD-10-CM

## 2017-05-02 DIAGNOSIS — H539 Unspecified visual disturbance: Secondary | ICD-10-CM | POA: Diagnosis not present

## 2017-05-02 DIAGNOSIS — R0683 Snoring: Secondary | ICD-10-CM | POA: Diagnosis not present

## 2017-05-02 DIAGNOSIS — R0681 Apnea, not elsewhere classified: Secondary | ICD-10-CM

## 2017-05-02 DIAGNOSIS — R531 Weakness: Secondary | ICD-10-CM

## 2017-05-02 DIAGNOSIS — G629 Polyneuropathy, unspecified: Secondary | ICD-10-CM | POA: Diagnosis not present

## 2017-05-02 DIAGNOSIS — R29818 Other symptoms and signs involving the nervous system: Secondary | ICD-10-CM | POA: Diagnosis not present

## 2017-05-02 DIAGNOSIS — G909 Disorder of the autonomic nervous system, unspecified: Secondary | ICD-10-CM | POA: Diagnosis not present

## 2017-05-02 DIAGNOSIS — G4719 Other hypersomnia: Secondary | ICD-10-CM

## 2017-05-02 DIAGNOSIS — R519 Headache, unspecified: Secondary | ICD-10-CM

## 2017-05-02 DIAGNOSIS — R410 Disorientation, unspecified: Secondary | ICD-10-CM | POA: Diagnosis not present

## 2017-05-02 DIAGNOSIS — M4712 Other spondylosis with myelopathy, cervical region: Secondary | ICD-10-CM

## 2017-05-02 DIAGNOSIS — R269 Unspecified abnormalities of gait and mobility: Secondary | ICD-10-CM

## 2017-05-02 DIAGNOSIS — M542 Cervicalgia: Secondary | ICD-10-CM

## 2017-05-02 NOTE — Patient Instructions (Addendum)
Remember to drink plenty of fluid, eat healthy meals and do not skip any meals. Try to eat protein with a every meal and eat a healthy snack such as fruit or nuts in between meals. Try to keep a regular sleep-wake schedule and try to exercise daily, particularly in the form of walking, 20-30 minutes a day, if you can.   As far as diagnostic testing: Labs, MRI, Sleep eval, physical therapy  I would like to see you back in 6 months, sooner if we need to. Please call us with any interim questions, concerns, problems, updates or refill requests.   Our phone number is [phone]. We also have an after hours call service for urgent matters and there is a physician on-call for urgent questions. For any emergencies you know to call 911 or go to the nearest emergency room

## 2017-05-02 NOTE — Progress Notes (Signed)
[facility]    Provider:  Dr Jaynee Eagles Referring Provider: Maury Dus, MD Primary Care Physician:  Maury Dus, MD  CC:  Diabetic autonomic neuropathy and orthostatic hypotension  HPI:  Devin Combs is a 66 y.o. male here as a referral from Dr. Alyson Ingles for neuropathy. Has a past medical history of type 2 diabetes, neuropathy, low back pain, anxiety, obstructive sleep apnea, hypogonadism, myalgia, hypertension, diabetic peripheral neuropathy, hyperlipidemia, major depression, irritable bowel syndrome, dizziness, erectile dysfunction, orthostatic hypotension, morbid obesity. Wife provides much information. In January 2017 he had a colonoscopy, he had diarrhea afterwards and also started to have other symptoms such as dizziness, having episodes of pre-syncope. He has had diabetes for over 30 years. He has orthostatic hypotension, dizziness, lightheadedness, fainting, fatigue, blurry vision, dry eyes, dry mouth, weakness, trouble concentrating/brain fog, dry mouth, weakness, head and neck pain, urinary retention, constipation, excessive seating, dysphonia, leg weak muscles. He is having memory loss and short-term memory. He is having orthostatic hypotension. He has seen cardiology. He has brain fog. Difficulty with passwords. Short-term memory for a year and worsening over the last few onths. Symptoms wax and wane. He lost track of one of his diabetic drugs, altered mentation. He has significant imbalance.  Reviewed notes, labs and imaging from outside physicians, which showed:  Reviewed notes for peripheral neuropathy, also associated muscle atrophy in the patient's thighs and arms, patient does not exercise regularly in fact rarely, rosuvastatin made him feel loopy, LDL cholesterol was greater than 190 off of the medication, he has irritable bowel syndrome, patient reports per cc in his feet and right knee, currently maintained on Cymbalta 60 mg one daily and is also using a  fentanyl patch for treatment of this concern, 90% effective at controlling his numbness and tingling, also with depression,, diabetic peripheral neuropathy, please evaluate for peripheral neuropathy and associated muscle atrophy in the patient's thighs and arms.  CBC with differential was collected July 2018 and was unremarkable, CMP showed elevated glucose, BUN 18, creatinine 1.02 otherwise  unremarkable, LDL 178, hemoglobin A1c 6.8 in July 2018. vitamin D 40, TSH normal, rheumatoid arthritis normal sedimentation rate normal, ANA negative. B12 437.   Review of Systems: Patient complains of symptoms per HPI as well as the following symptoms: Weight loss, fatigue, blurred vision, snoring, feeling hot, flushing, joint pain, confusion, weakness, dizziness, tremor, depression, anxiety, sleepiness, snoring, decreased energy, change in appetite, urination problems. Pertinent negatives and positives per HPI. All others negative.   Social History   Social History  . Marital status: Married    Spouse name: N/A  . Number of children: N/A  . Years of education: N/A   Occupational History  . Not on file.   Social History Main Topics  . Smoking status: Never Smoker  . Smokeless tobacco: Never Used  . Alcohol use Yes     Comment: rare  . Drug use: No  . Sexual activity: Not on file   Other Topics Concern  . Not on file   Social History Narrative   Lives at home with wife, who is Therapist, sports.  Pt is retired.  Caffeine 1-2 cups daily.      Family History  Problem Relation Age of Onset  . Neuropathy Neg Hx     Past Medical History:  Diagnosis Date  . Anxiety   . Arthritis   . Cancer (HCC)    basal and squamous cell carcinoma  . Chronic back pain   . DDD (degenerative disc disease), thoracolumbar   .  Degenerative cervical spinal stenosis   . Depression   . Diabetic autonomic neuropathy ([place])   . Dyspnea    with exertion   . GERD (gastroesophageal reflux disease)   . Headache   . History of  bronchitis   . History of kidney stones   . Hyperlipidemia   . Hypertension   . Hypogonadism in male   . Myocardial infarction ([place])   . Neuropathy   . Orthostatic hypotension   . OSA (obstructive sleep apnea)    per pt study done 2006 (approx)  used cpap few yrs then stopped using, stated didn't feel he needed it anymore  . Psoriasis   . Right knee meniscal tear   . Type 2 diabetes mellitus ([place])   . Urinary hesitancy   . Vitamin D deficiency     Past Surgical History:  Procedure Laterality Date  . CATARACT EXTRACTION W/ INTRAOCULAR LENS  IMPLANT, BILATERAL  right 2015//  left 2012  . CORRECTION REPAIR MULTIPLE  TOES , LEFT FOOT  YRS AGO  . EXTRACORPOREAL SHOCK WAVE LITHOTRIPSY  2000 approx  . KNEE ARTHROSCOPY Left 2007  . KNEE ARTHROSCOPY Right 02/10/2015   Procedure: ARTHROSCOPY RIGHT KNEE WITH DEBRIDEMENT AND CHRONDROPLASTY;  Surgeon: Gaynelle Arabian, MD;  Location: [facility];  Service: Orthopedics;  Laterality: Right;  . PARTIAL KNEE ARTHROPLASTY Right 07/19/2016   Procedure: RIGHT KNEE MEDICAL UNICOMPARTMENTAL ARTHROPLASTY;  Surgeon: Gaynelle Arabian, MD;  Location: [facility];  Service: Orthopedics;  Laterality: Right;  . RADICAL ORCHIECTOMY Left 1996   benign  . REMOVAL SPURS RIGHT GREAT TOE  YRS AGO  . TOTAL KNEE ARTHROPLASTY Left 09-16-2007    Current Outpatient Prescriptions  Medication Sig Dispense Refill  . aluminum chloride (DRYSOL) 20 % external solution Apply 1 application topically every 7 (seven) days.     . diazepam (VALIUM) 5 MG tablet Take 1 tablet (5 mg total) by mouth every 6 (six) hours as needed. AS NEEDED FOR MUSCLE SPASMS 80 tablet 1  . DULoxetine (CYMBALTA) 60 MG capsule Take 60 mg by mouth every evening.    . fentaNYL (DURAGESIC - DOSED MCG/HR) 75 MCG/HR Place 75 mcg onto the skin every other day.    . hydrochlorothiazide (MICROZIDE) 12.5 MG capsule TAKE 1 CAPSULE (12.5 MG TOTAL) EVERY MORNING AS NEEDED FOR A BLOOD PRESSURE GREATER THAN 180.  30 capsule 6  . metFORMIN (GLUCOPHAGE) 1000 MG tablet Take 1,000 mg by mouth 2 (two) times daily with a meal.    . methocarbamol (ROBAXIN) 500 MG tablet Take 1 tablet (500 mg total) by mouth 4 (four) times daily. (Patient taking differently: Take 500 mg by mouth every 6 (six) hours as needed for muscle spasms. ) 30 tablet 1  . omeprazole (PRILOSEC) 20 MG capsule Take 20 mg by mouth every morning.  5  . oxyCODONE (OXY IR/ROXICODONE) 5 MG immediate release tablet Take 1-4 tablets (5-20 mg total) by mouth every 3 (three) hours as needed for moderate pain or severe pain. 90 tablet 0  . oxymetazoline (AFRIN) 0.05 % nasal spray Place 1 spray into both nostrils 2 (two) times daily as needed for congestion.    . rosuvastatin (CRESTOR) 20 MG tablet Take 20 mg by mouth. Takes once weekly.    Marland Kitchen lisinopril (PRINIVIL,ZESTRIL) 40 MG tablet Take 1 tablet (40 mg total) by mouth every evening. 90 tablet 3  . valACYclovir (VALTREX) 1000 MG tablet Take 1,000 mg by mouth daily as needed (outbreaks).      No current  facility-administered medications for this visit.     Allergies as of 05/02/2017  . (No Known Allergies)    Vitals: BP (!) 165/102   Pulse (!) 104   Ht 6' (1.829 m)   Wt 264 lb 12.8 oz (120.1 kg)   BMI 35.91 kg/m  Last Weight:  Wt Readings from Last 1 Encounters:  05/02/17 264 lb 12.8 oz (120.1 kg)   Last Height:   Ht Readings from Last 1 Encounters:  05/02/17 6' (1.829 m)     Physical exam: Exam: Gen: NAD, conversant, well nourised, obese, well groomed                     CV: RRR, no MRG. No Carotid Bruits. No peripheral edema, warm, nontender Eyes: Conjunctivae clear without exudates or hemorrhage Skin: Erythema and discoloration distal in the left lower extremity with breakdown of the skin at the lateral malleolus.  Neuro: Detailed Neurologic Exam  Speech:    Speech is normal; fluent and spontaneous with normal comprehension.  Cognition:    The patient is oriented to person,  place, and time;     recent and remote memory intact;     language fluent;     normal attention, concentration,     fund of knowledge Cranial Nerves:    The pupils are equal, round, and reactive to light. Attempted funduscopic exam could not visualize Visual fields are full to finger confrontation. Extraocular movements are intact. Trigeminal sensation is intact and the muscles of mastication are normal. The face is symmetric. The palate elevates in the midline. Hearing intact. Voice is normal. Shoulder shrug is normal. The tongue has normal motion without fasciculations.   Coordination:    No dysmetria.   Gait:    Imbalance with heel, toe, tandem walking. Wide based. Erect posture, not shuffling, not magnetic, good turns, good arm movements.  Motor Observation:    No asymmetry, no atrophy, and no involuntary movements noted. Tone:    Normal muscle tone.    Posture:    Posture is normal. normal erect    Strength:    Strength is V/V in the upper and lower limbs.      Sensation: Decreased pinprick to the knee, absent proprioception and vibration distally in the lower extremities.     Reflex Exam:  DTR's:    Absent Ajs. Toes:    The toes are equivocal bilaterally.   Clonus:    Clonus is absent.      Assessment/Plan:  66-year-old male with multiple neurologic symptoms including severe distal large and small fiber neuropathy, likely diabetic but need to evaluate for other causes. Also reports fatigue, and other symptoms suspicious for autonomic neuropathy including dizziness, erectile dysfunction, orthostatic hypotension, irritable bowel syndrome, dizziness, lightheadedness, blurry vision, dry eyes and other symptoms. The extensive workup for other causes of his symptoms including obstructive sleep apnea, other causes of neuropathy. past medical history of obstructive sleep apnea and treated at this time needs repeat sleep evaluation.  - Hx of OSA on cpap, hasn't used one in  years, witnessed apneic events by wife, snoring, nodding off during the day, headaches, obesity: Sleep evaluation  - MRI brain due to new onset headaches after the age of 50, positional headaches to evaluate for masses, lesions or other etiology.  - MRI of the cervical spine due to imbalance, ataxia, neck pain and radicular symptoms to evaluate for cervical myelopathy.  - Peripheral neuropathy: Likely diabetic but need to evaluate for other causes.   Extensive lab tests ordered.  - Erythema and discoloration distal in the left lower extremity with breakdown of the skin at the lateral malleolus, worse when dependent improved with raising leg, becomes cold, likely peripheral vascular disease f/u with pcp  - Imbalance, fall risk, ataxia: Physical therapy, fall precautions  - Autonomic neuropathy: Will check labs for other causes, some symptoms may be due to medications or untreated sleep apnea, May consider checking a paraneoplastic panel as well as autoantibodies to ganglionic nicotinic acetylcholine receptors (gAChR) in future  - Given vascular risk factors and presyncope, may consider imaging of the blood vessels of the head and neck  F/u sleep eval in one month, with me in 4-6 months   Orders Placed This Encounter  Procedures  . MR BRAIN W WO CONTRAST  . MR CERVICAL SPINE WO CONTRAST  . Sjogren's syndrome antibods(ssa + ssb)  . Pan-ANCA  . Tissue transglutaminase, IgA  . Gliadin antibodies, serum  . Heavy metals, blood  . Vitamin B6  . Multiple Myeloma Panel (SPEP&IFE w/QIG)  . Lyme Ab/Western Blot Reflex  . B. burgdorfi Antibody  . Vitamin B1  . Basic Metabolic Panel  . Ambulatory referral to Sleep Studies  . Ambulatory referral to Physical Therapy   Sarina Ill, MD  [facility] [address] [place], [place] [address]  Phone [phone] Fax [phone]

## 2017-05-03 ENCOUNTER — Encounter: Payer: Self-pay | Admitting: Neurology

## 2017-05-08 LAB — SJOGREN'S SYNDROME ANTIBODS(SSA + SSB)
ENA SSA (RO) Ab: <0.2 AI (ref 0.0–0.9)
ENA SSB (LA) Ab: <0.2 AI (ref 0.0–0.9)

## 2017-05-08 LAB — GLIADIN ANTIBODIES, SERUM
Antigliadin Abs, IgA: 4 units (ref 0–19)
GLIADIN IGG: 3 U (ref 0–19)

## 2017-05-08 LAB — BASIC METABOLIC PANEL
BUN / CREAT RATIO: 14 (ref 10–24)
BUN: 17 mg/dL (ref 8–27)
CO2: 24 mmol/L (ref 20–29)
CREATININE: 1.22 mg/dL (ref 0.76–1.27)
Calcium: 10.6 mg/dL — ABNORMAL HIGH (ref 8.6–10.2)
Chloride: 99 mmol/L (ref 96–106)
GFR calc non Af Amer: 62 mL/min/{1.73_m2} (ref >59)
GFR, EST AFRICAN AMERICAN: 71 mL/min/{1.73_m2} (ref >59)
Glucose: 181 mg/dL — ABNORMAL HIGH (ref 65–99)
Potassium: 4.6 mmol/L (ref 3.5–5.2)
SODIUM: 143 mmol/L (ref 134–144)

## 2017-05-08 LAB — MULTIPLE MYELOMA PANEL, SERUM
ALBUMIN SERPL ELPH-MCNC: 3.8 g/dL (ref 2.9–4.4)
ALBUMIN/GLOB SERPL: 1.1 (ref 0.7–1.7)
ALPHA 1: 0.2 g/dL (ref 0.0–0.4)
Alpha2 Glob SerPl Elph-Mcnc: 1.1 g/dL — ABNORMAL HIGH (ref 0.4–1.0)
B-GLOBULIN SERPL ELPH-MCNC: 1.4 g/dL — AB (ref 0.7–1.3)
GAMMA GLOB SERPL ELPH-MCNC: 0.7 g/dL (ref 0.4–1.8)
GLOBULIN, TOTAL: 3.5 g/dL (ref 2.2–3.9)
IgA/Immunoglobulin A, Serum: 218 mg/dL (ref 61–437)
IgG (Immunoglobin G), Serum: 711 mg/dL (ref 700–1600)
IgM (Immunoglobulin M), Srm: 121 mg/dL (ref 20–172)
Total Protein: 7.3 g/dL (ref 6.0–8.5)

## 2017-05-08 LAB — HEAVY METALS, BLOOD
Arsenic: 6 ug/L (ref 2–23)
Lead, Blood: NOT DETECTED ug/dL (ref 0–4)
Mercury: NOT DETECTED ug/L (ref 0.0–14.9)

## 2017-05-08 LAB — LYME AB/WESTERN BLOT REFLEX
LYME DISEASE AB, QUANT, IGM: <0.8 index (ref 0.00–0.79)
Lyme IgG/IgM Ab: <0.91 {ISR} (ref 0.00–0.90)

## 2017-05-08 LAB — PAN-ANCA
ANCA Proteinase 3: <3.5 U/mL (ref 0.0–3.5)
C-ANCA: <1:20 {titer}

## 2017-05-08 LAB — VITAMIN B6: VITAMIN B6: 9.4 ug/L (ref 5.3–46.7)

## 2017-05-08 LAB — TISSUE TRANSGLUTAMINASE, IGA: Transglutaminase IgA: <2 U/mL (ref 0–3)

## 2017-05-08 LAB — VITAMIN B1: THIAMINE: 217.7 nmol/L — AB (ref 66.5–200.0)

## 2017-05-14 DIAGNOSIS — E782 Mixed hyperlipidemia: Secondary | ICD-10-CM | POA: Diagnosis not present

## 2017-05-14 DIAGNOSIS — E291 Testicular hypofunction: Secondary | ICD-10-CM | POA: Diagnosis not present

## 2017-05-31 ENCOUNTER — Ambulatory Visit (INDEPENDENT_AMBULATORY_CARE_PROVIDER_SITE_OTHER): Payer: Medicare Other | Admitting: Neurology

## 2017-05-31 ENCOUNTER — Encounter: Payer: Self-pay | Admitting: Neurology

## 2017-05-31 VITALS — BP 157/97 | HR 104 | Ht 72.0 in | Wt 264.0 lb

## 2017-05-31 DIAGNOSIS — G909 Disorder of the autonomic nervous system, unspecified: Secondary | ICD-10-CM | POA: Insufficient documentation

## 2017-05-31 DIAGNOSIS — I951 Orthostatic hypotension: Secondary | ICD-10-CM | POA: Insufficient documentation

## 2017-05-31 DIAGNOSIS — R0683 Snoring: Secondary | ICD-10-CM

## 2017-05-31 DIAGNOSIS — R61 Generalized hyperhidrosis: Secondary | ICD-10-CM | POA: Diagnosis not present

## 2017-05-31 DIAGNOSIS — I2510 Atherosclerotic heart disease of native coronary artery without angina pectoris: Secondary | ICD-10-CM | POA: Diagnosis not present

## 2017-05-31 DIAGNOSIS — G4719 Other hypersomnia: Secondary | ICD-10-CM | POA: Diagnosis not present

## 2017-05-31 DIAGNOSIS — R002 Palpitations: Secondary | ICD-10-CM | POA: Diagnosis not present

## 2017-05-31 DIAGNOSIS — G903 Multi-system degeneration of the autonomic nervous system: Secondary | ICD-10-CM

## 2017-05-31 NOTE — Patient Instructions (Signed)

## 2017-05-31 NOTE — Progress Notes (Signed)
SLEEP MEDICINE CLINIC   Provider:  Larey Combs, M D  Primary Care Physician:  Devin Dus, MD   Referring Provider: Maury Dus, MD   Chief Complaint  Patient presents with  . New Patient (Initial Visit)    pt with wife, rm 10. pt snores and gasps for air. pt was diagnosed with sleep apnea 20 years ago and used a cpap but he stopped 15 years ago.     HPI:  Devin Combs is a 66 y.o. male , seen here as in a referral  from Devin Combs for a sleep consultation.   Devin Combs is a 66 year old married Caucasian right-handed male, presenting today upon request of my colleague Devin Combs. His body mass index is currently 58, he was referred for a neuropathy workup and he has carried a long list of diagnoses including type 2 diabetes, degenerative lower back pain, anxiety, hypogonadism, myalgia, hypertension, diabetic peripheral neuropathy, autonomic neuropathy, GERD, major depression orthostatic hypotension. He also has suffered from obstructive sleep apnea in the past but is currently not a CPAP user. He lost weight intermittently which has reduced his snoring but by now his wife is observing snoring and apneas again. Devin. Cathren Combs concern is directed towards terminating an underlying condition that could contribute to atrial fibrillation, strokes, and poorly controlled hypertension.  Sleep habits are as follows: The patient usually watches TV prior to going to bed at about midnight, but on occasion it will be as late as 2 AM. The bedroom is cool, not quiet and dark. He insists on his TV running in the background, much to his wife's dismay. At this time he is able to from to go to sleep, he sleeps usually on his side with one pillow for head support, he also uses a wedge. He will remain asleep for about 2 hours at a time but wakes up frequently in between and terms. He feels that back discomfort is the cause of this. He has nocturia up to 3 times at night. He reports a lot of  dreams., is sleep talking.  He does haveurinary retention -it is difficult for him to initiate a steady stream. He wakes up spontaneously about 9:30 AM when he rises. He estimates over all his nocturnal sleep time to be 8 hours or more. He does not feel refreshed or restored his back hurts him in the morning as well as when he goes to bed.  Sleep medical history and family sleep history: no other family history of OSA.   Social history: married, no tobacco use ever, ETOH- very little, ever since college - caffeine use; iced sweet tea, for breakfast, lunch but not later/  Retired, remote history of shift work. Married to a Museum/gallery exhibitions officer.   Primary Care Physician:  Devin Dus, MD  Devin Combs notes: CC:  Diabetic autonomic neuropathy and orthostatic hypotension  HPI:  Devin Combs is a 66 y.o. male here as a referral from Devin. Alyson Combs for neuropathy. Has a past medical history of type 2 diabetes, neuropathy, low back pain, anxiety, obstructive sleep apnea, hypogonadism, myalgia, hypertension, diabetic peripheral neuropathy, hyperlipidemia, major depression, irritable bowel syndrome, dizziness, erectile dysfunction, orthostatic hypotension, morbid obesity. Wife provides much information. In January 2017 he had a colonoscopy, he had diarrhea afterwards and also started to have other symptoms such as dizziness, having episodes of pre-syncope. He has had diabetes for over 30 years. He has orthostatic hypotension, dizziness, lightheadedness, fainting, fatigue, blurry vision, dry eyes, dry mouth, weakness, trouble concentrating/brain  fog, dry mouth, weakness, head and neck pain, urinary retention, constipation, excessive seating, dysphonia, leg weak muscles. He is having memory loss and short-term memory. He is having orthostatic hypotension. He has seen cardiology. He has brain fog. Difficulty with passwords. Short-term memory for a year and worsening over the last few onths. Symptoms wax and wane. He lost  track of one of his diabetic drugs, altered mentation. He has significant imbalance.  Reviewed notes, labs and imaging from outside physicians, which showed: Reviewed notes for peripheral neuropathy, also associated muscle atrophy in the patient's thighs and arms, patient does not exercise regularly in fact rarely, rosuvastatin made him feel loopy, LDL cholesterol was greater than 190 off of the medication, he has irritable bowel syndrome, patient reports per cc in his feet and right knee, currently maintained on Cymbalta 60 mg one daily and is also using a fentanyl patch for treatment of this concern, 90% effective at controlling his numbness and tingling, also with depression,, diabetic peripheral neuropathy, please evaluate for peripheral neuropathy and associated muscle atrophy in the patient's thighs and arms.  CBC with differential was collected July 2018 and was unremarkable, CMP showed elevated glucose, BUN 18, creatinine 1.02 otherwise  unremarkable, LDL 178, hemoglobin A1c 6.8 in July 2018. vitamin D 40, TSH normal, rheumatoid arthritis normal sedimentation rate normal, ANA negative. B12 437.     Review of Systems: Out of a complete 14 system review, the patient complains of only the following symptoms, and all other reviewed systems are negative. snorring sleep talking, fragmented sleep, witnessed apnea.   Epworth score  10, Fatigue severity score 40  , depression score n/a    Social History   Social History  . Marital status: Married    Spouse name: N/A  . Number of children: N/A  . Years of education: N/A   Occupational History  . Not on file.   Social History Main Topics  . Smoking status: Never Smoker  . Smokeless tobacco: Never Used  . Alcohol use Yes     Comment: rare  . Drug use: No  . Sexual activity: Not on file   Other Topics Concern  . Not on file   Social History Narrative   Lives at home with wife, who is Therapist, sports.  Pt is retired.  Caffeine 1-2 cups daily.       Family History  Problem Relation Age of Onset  . Neuropathy Neg Hx     Past Medical History:  Diagnosis Date  . Anxiety   . Arthritis   . Cancer (HCC)    basal and squamous cell carcinoma  . Chronic back pain   . DDD (degenerative disc disease), thoracolumbar   . Degenerative cervical spinal stenosis   . Depression   . Diabetic autonomic neuropathy ([place])   . Dyspnea    with exertion   . GERD (gastroesophageal reflux disease)   . Headache   . History of bronchitis   . History of kidney stones   . Hyperlipidemia   . Hypertension   . Hypogonadism in male   . Myocardial infarction ([place])   . Neuropathy   . Orthostatic hypotension   . OSA (obstructive sleep apnea)    per pt study done 2006 (approx)  used cpap few yrs then stopped using, stated didn't feel he needed it anymore  . Psoriasis   . Right knee meniscal tear   . Type 2 diabetes mellitus ([place])   . Urinary hesitancy   . Vitamin D deficiency  Past Surgical History:  Procedure Laterality Date  . CATARACT EXTRACTION W/ INTRAOCULAR LENS  IMPLANT, BILATERAL  right 2015//  left 2012  . CORRECTION REPAIR MULTIPLE  TOES , LEFT FOOT  YRS AGO  . EXTRACORPOREAL SHOCK WAVE LITHOTRIPSY  2000 approx  . KNEE ARTHROSCOPY Left 2007  . KNEE ARTHROSCOPY Right 02/10/2015   Procedure: ARTHROSCOPY RIGHT KNEE WITH DEBRIDEMENT AND CHRONDROPLASTY;  Surgeon: Gaynelle Arabian, MD;  Location: [facility];  Service: Orthopedics;  Laterality: Right;  . PARTIAL KNEE ARTHROPLASTY Right 07/19/2016   Procedure: RIGHT KNEE MEDICAL UNICOMPARTMENTAL ARTHROPLASTY;  Surgeon: Gaynelle Arabian, MD;  Location: [facility];  Service: Orthopedics;  Laterality: Right;  . RADICAL ORCHIECTOMY Left 1996   benign  . REMOVAL SPURS RIGHT GREAT TOE  YRS AGO  . TOTAL KNEE ARTHROPLASTY Left 09-16-2007    Current Outpatient Prescriptions  Medication Sig Dispense Refill  . aluminum chloride (DRYSOL) 20 % external solution Apply 1 application topically  every 7 (seven) days.     Marland Kitchen Apoaequorin (PREVAGEN) 10 MG CAPS Take 1 capsule by mouth daily.    . diazepam (VALIUM) 5 MG tablet Take 1 tablet (5 mg total) by mouth every 6 (six) hours as needed. AS NEEDED FOR MUSCLE SPASMS 80 tablet 1  . DULoxetine (CYMBALTA) 60 MG capsule Take 60 mg by mouth every evening.    . fentaNYL (DURAGESIC - DOSED MCG/HR) 75 MCG/HR Place 75 mcg onto the skin every 3 (three) days.     . hydrochlorothiazide (MICROZIDE) 12.5 MG capsule TAKE 1 CAPSULE (12.5 MG TOTAL) EVERY MORNING AS NEEDED FOR A BLOOD PRESSURE GREATER THAN 180. 30 capsule 6  . metFORMIN (GLUCOPHAGE) 1000 MG tablet Take 1,000 mg by mouth 2 (two) times daily with a meal.    . methocarbamol (ROBAXIN) 500 MG tablet Take 1 tablet (500 mg total) by mouth 4 (four) times daily. (Patient taking differently: Take 500 mg by mouth every 6 (six) hours as needed for muscle spasms. ) 30 tablet 1  . omeprazole (PRILOSEC) 20 MG capsule Take 20 mg by mouth every morning.  5  . oxyCODONE (OXY IR/ROXICODONE) 5 MG immediate release tablet Take 1-4 tablets (5-20 mg total) by mouth every 3 (three) hours as needed for moderate pain or severe pain. 90 tablet 0  . oxymetazoline (AFRIN) 0.05 % nasal spray Place 1 spray into both nostrils 2 (two) times daily as needed for congestion.    . rosuvastatin (CRESTOR) 20 MG tablet Take 20 mg by mouth. Takes once weekly.    . valACYclovir (VALTREX) 1000 MG tablet Take 1,000 mg by mouth daily as needed (outbreaks).     Marland Kitchen lisinopril (PRINIVIL,ZESTRIL) 40 MG tablet Take 1 tablet (40 mg total) by mouth every evening. 90 tablet 3   No current facility-administered medications for this visit.     Allergies as of 05/31/2017  . (No Known Allergies)    Vitals: BP (!) 157/97   Pulse (!) 104   Ht 6' (1.829 m)   Wt 264 lb (119.7 kg)   BMI 35.80 kg/m  Last Weight:  Wt Readings from Last 1 Encounters:  05/31/17 264 lb (119.7 kg)   JIR:CVEL mass index is 35.8 kg/m.     Last Height:   Ht  Readings from Last 1 Encounters:  05/31/17 6' (1.829 m)    Physical exam:  General: The patient is awake, alert and appears not in acute distress. The patient is diaphoretic-  Head: Normocephalic, atraumatic. Neck is supple. Mallampati   neck  circumference:20.25 . Nasal airflow patent , TMJ click is not found. evident  Cardiovascular:  Regular rate and rhythm , without  murmurs or carotid bruit, and without distended neck veins. Respiratory: Lungs are clear to auscultation. Skin:  Without evidence of edema, or rash Trunk: BMI is 36 The patient's posture is erect.   Neurologic exam : The patient is awake and alert, oriented to place and time.   Memory subjective described as intact.  Attention span & concentration ability appears normal.  Speech is fluent,  without  dysarthria, dysphonia or aphasia.  Mood and affect are appropriate.  Cranial nerves: Pupils are equal and briskly reactive to light. Funduscopic exam without evidence of pallor or edema.  Extraocular movements  in vertical and horizontal planes intact and without nystagmus. Visual fields by finger perimetry are intact. Hearing to finger rub intact. Facial sensation intact to fine touch.Facial motor strength is symmetric and tongue and uvula move midline. Shoulder shrug was symmetrical.   Motor exam:   Normal tone, muscle bulk in all extremities. He reports subjective hip flexor weakness, his quadriceps appears smaller. Sensory:  Fine touch, pinprick and vibration were tested in all extremities. Proprioception tested in the upper extremities was normal. Coordination: Rapid alternating movements in the fingers/hands were slowed- his handwriting has changed.  Finger-to-nose maneuver without evidence of ataxia, dysmetria or tremor. Gait and station: Patient walks without assistive device.   Assessment:  After physical and neurologic examination, review of laboratory studies,  Personal review of imaging studies, reports of other  /same  Imaging studies, results of polysomnography and / or neurophysiology testing and pre-existing records as far as provided in visit., my assessment is   1)  this gentleman carries multiple neurologic symptoms including a severe distal large and small fiber neuropathy which was attributed to diabetes in the past but now seems to half possible other causes, he has also complained of excessive sweating, near fainting, orthostatic hypotension, urinary retention, blurry vision dry eyes. Since he had a history of previously diagnosed OSA and now again presents with apneic events that his wife has witnessed as well as snoring, nodding off during the day, headache, morbid obesity after recent weight gain it is time for a full sleep evaluation with an attended sleep study. I need to monitor heart rate and Co2 and SpO2 - peak and total duration.    The patient was advised of the nature of the diagnosed disorder , the treatment options and the  risks for general health and wellness arising from not treating the condition.   I spent more than 50 minutes of face to face time with the patient.  Greater than 50% of time was spent in counseling and coordination of care. We have discussed the diagnosis and differential and I answered the patient's questions.    Plan:  Treatment plan and additional workup : Attended sleep study with dysautonomia montage- including CO2 and O2 , heart rate and sleep architecture. Patient sleep talks, but has not enacted dreams.    Devin Seat, MD 20/25/4270, 6:23 PM  Certified in Neurology by ABPN Certified in [place] by [facility] [address] [place], [place] [address]

## 2017-06-05 DIAGNOSIS — M48062 Spinal stenosis, lumbar region with neurogenic claudication: Secondary | ICD-10-CM | POA: Diagnosis not present

## 2017-06-05 DIAGNOSIS — M4316 Spondylolisthesis, lumbar region: Secondary | ICD-10-CM | POA: Diagnosis not present

## 2017-06-08 ENCOUNTER — Ambulatory Visit (INDEPENDENT_AMBULATORY_CARE_PROVIDER_SITE_OTHER): Payer: Medicare Other | Admitting: Neurology

## 2017-06-08 DIAGNOSIS — R61 Generalized hyperhidrosis: Secondary | ICD-10-CM

## 2017-06-08 DIAGNOSIS — G4719 Other hypersomnia: Secondary | ICD-10-CM

## 2017-06-08 DIAGNOSIS — G4739 Other sleep apnea: Secondary | ICD-10-CM

## 2017-06-08 DIAGNOSIS — I2129 ST elevation (STEMI) myocardial infarction involving other sites: Secondary | ICD-10-CM

## 2017-06-08 DIAGNOSIS — R0683 Snoring: Secondary | ICD-10-CM

## 2017-06-08 DIAGNOSIS — G903 Multi-system degeneration of the autonomic nervous system: Secondary | ICD-10-CM

## 2017-06-08 DIAGNOSIS — I2510 Atherosclerotic heart disease of native coronary artery without angina pectoris: Secondary | ICD-10-CM

## 2017-06-08 DIAGNOSIS — G4731 Primary central sleep apnea: Secondary | ICD-10-CM

## 2017-06-08 DIAGNOSIS — R002 Palpitations: Secondary | ICD-10-CM

## 2017-06-08 DIAGNOSIS — I951 Orthostatic hypotension: Secondary | ICD-10-CM

## 2017-06-13 NOTE — Procedures (Signed)
PATIENT'S NAME:  Devin Combs, Poole DOB:      1951-01-06      MR#:    [id number]     DATE OF RECORDING: 06/08/2017 REFERRING M.D.:  Maury Dus MD Study Performed:  Split-Night Titration Study HISTORY:  non restorative sleep in an obese male patient with reported night sweats, snoring, CAD, EDS  The patient endorsed the Epworth Sleepiness Scale at 10/24 points   The patient's weight 265 pounds with a height of 72 (inches), resulting in a BMI of 35.8 kg/m2. The patient's neck circumference measured 20.5 inches.  CURRENT MEDICATIONS: Drisdol, Prevagen, Valium, Cymbalta, Fentanyl, Microzide, Metformin, Robaxin, Prilosec, Oxycodone, Afrin, Crestor, Valtrex, Lisinopril   PROCEDURE:  This is a multichannel digital polysomnogram utilizing the Somnostar 11.2 system.  Electrodes and sensors were applied and monitored per AASM Specifications.   EEG, EOG, Chin and Limb EMG, were sampled at 200 Hz.  ECG, Snore and Nasal Pressure, Thermal Airflow, Respiratory Effort, CPAP Flow and Pressure, Oximetry was sampled at 50 Hz. Digital video and audio were recorded.      BASELINE STUDY WITHOUT CPAP RESULTS:  Lights Out was at 22:45 and Lights On at 04:59.  Total recording time (TRT) was 84, with a total sleep time (TST) of 73 minutes.   The patient's sleep latency was 5.0 minutes.  REM latency was 0.0 minutes.  The sleep efficiency was 86.9 %.    SLEEP ARCHITECTURE: WASO (Wake after sleep onset) was 3.5 minutes, Stage N1 was 6.5 minutes, Stage N2 was 16.5 minutes, Stage N3 was 50 minutes and Stage R (REM sleep) was 0 minutes.  The percentages were Stage N1 8.9%, Stage N2 22.6%, Stage N3 68.5% and Stage R (REM sleep) 0%.  RESPIRATORY ANALYSIS:  There were a total of 78 respiratory events:  35 obstructive apneas, 10 central apneas and 1 mixed apnea with a total of 46 apneas and an apnea index (AI) of 37.8. There were 32 hypopneas with a hypopnea index of 26.3. The patient also had 0 respiratory event related arousals  (RERAs).  Snoring was noted.    The total APNEA/HYPOPNEA INDEX (AHI) was 64.1 /hour and the total RESPIRATORY DISTURBANCE INDEX was 64.1 /hour.  0 events occurred in REM sleep and 75 events in NREM. The REM AHI was 0, /hour versus a non-REM AHI of 64.1 /hour. The patient spent 0 minutes sleep time in the supine position 243 minutes in non-supine. The supine AHI was 0.0 /hour versus a non-supine AHI of 64.1 /hour.  OXYGEN SATURATION & C02:  The wake baseline 02 saturation was 96%, with the lowest being 86%. Time spent below 89% saturation equaled 15 minutes.  PERIODIC LIMB MOVEMENTS:   The patient had a total of 0 Periodic Limb Movements. The arousals were noted as: 10 were spontaneous, 0 were associated with PLMs, and 18 were associated with respiratory events.   EKG was in keeping with normal sinus rhythm (NSR)   TITRATION STUDY WITH CPAP RESULTS: CPAP was initiated at 5 cmH20 with heated humidity per AASM split night standards and pressure was advanced to 6 cm CPAP, from there changed to BiPAP at 9/5 cm water pressure. Final pressure was 10/6 cmH20, with a reduction of the AHI to 3.0 /hour.   Total recording time (TRT) was 291 minutes, with a total sleep time (TST) of 170 minutes. The patient's sleep latency was 60 minutes. REM latency was 112.5 minutes.  The sleep efficiency was 58.4 %.    SLEEP ARCHITECTURE: Wake after sleep was 45  minutes, Stage N1 16.5 minutes, Stage N2 34.5 minutes, Stage N3 105.5 minutes and Stage R (REM sleep) 13.5 minutes. The percentages were: Stage N1 9.7%, Stage N2 20.3%, Stage N3 62.1% and Stage R (REM sleep) 7.9%. The sleep architecture was notable for some REM sleep rebound. The arousals were noted as: 29 were spontaneous, 0 were associated with PLMs, and 4 were associated with respiratory events.  RESPIRATORY ANALYSIS:  There were a total of 41 respiratory events: 0 obstructive apneas, 34 central apneas and 0 mixed apneas with a total of 34 apneas and an apnea  index (AI) of 12.  There were 7 hypopneas with a hypopnea index of 2.5 /hour. The patient also had 0 respiratory event related arousals (RERAs). The total APNEA/HYPOPNEA INDEX (AHI) was 14.5 /hour and the total RESPIRATORY DISTURBANCE INDEX was 14.5 /hour.  0 events occurred in REM sleep and 41 events in NREM. The REM AHI was 0 /hour versus a non-REM AHI of 15.7 /hour. The supine AHI was 0.0 /hour, versus a non-supine AHI of 14.5/hour.  OXYGEN SATURATION & C02:  The wake baseline 02 saturation was 94%, with the lowest being 88%. Time spent below 89% saturation equaled 1 minute.  PERIODIC LIMB MOVEMENTS:   The patient had a total of 0 Periodic Limb Movements. Post-study, the patient indicated that sleep was better than usual.  POLYSOMNOGRAPHY IMPRESSION :   1. Severe Obstructive Sleep Apnea (OSA) at AHI 64.1/hr. OSA responded somewhat to BiPAP. CPAP treatment created emergent central apnea.  BiPAP was not fully titrated, and the last attempted pressure was not well tolerated.  2. Snoring  RECOMMENDATIONS:  1. Advise to start BiPAP at 11/7 cmH2O with 10 ST and follow clinical symptomatology.   2. If the current BiPAP setting is not correcting central and obstructive apneas, follow up with a full-night, attended, BiPAP or ASV  titration study to optimize therapy.   3. An ESON small nasal interface was used with heated humidity during this study.  Advise to add heated humidity.  Adjust interface and heated humidity as needed.     4. Compliance to PAP therapy should be emphasized.  Compliance, AHI and air leak information to be downloaded for objective assessment at 30 days, 180 days and annually thereafter.   5. A follow up appointment will be scheduled in the Sleep Clinic at [facility].      I certify that I have reviewed the entire raw data recording prior to the issuance of this report in accordance with the Standards of Accreditation of the American Academy of Sleep Medicine  (AASM)    Larey Seat, M.D.   06-13-2017  Diplomat, American Board of Psychiatry and Neurology  Diplomat, [place] of Sleep Medicine Medical Director, [place] [facility]

## 2017-06-13 NOTE — Addendum Note (Signed)
Addended by: Larey Seat on: 06/13/2017 05:23 PM   Modules accepted: Orders

## 2017-06-14 ENCOUNTER — Encounter: Payer: Self-pay | Admitting: Neurology

## 2017-06-14 ENCOUNTER — Telehealth: Payer: Self-pay | Admitting: Neurology

## 2017-06-14 NOTE — Telephone Encounter (Signed)
-----   Message from Larey Seat, MD sent at 06/13/2017  5:23 PM EDT ----- Complex and severe sleep apnea was diagnosed, AHI of over 60/hr. Of sleep. Patient was first tried on CPAP which created central apneas, than on BiPAP which did not completely resolve apneas either. ST function was added( timed breathing) and may not have been tolerated well. I recommend either a 30 day trial on BIPAP 11/7 cm water 10 ST or return ASAP for full night bipap with possible ASV titration.  In order to get a machine in time to Devin Combs it may be best to schedule a full night return.

## 2017-06-14 NOTE — Telephone Encounter (Signed)
I called pt. I advised pt that Dr. Brett Fairy reviewed their sleep study results and found that pt severe sleep apnea. Dr. Brett Fairy recommends that pt starts BiPAP as the CPAP was tried and it created central apneas. Dr Dohmeier would like to start the pt on BiPAP and see if we find this beneficial with treating his apnea. I reviewed PAP compliance expectations with the pt. Pt is agreeable to starting a CPAP. I advised pt that an order will be sent to a DME, [facility], and [facility] will call the pt within about one week after they file with the pt's insurance. [facility] will show the pt how to use the machine, fit for masks, and troubleshoot the CPAP if needed. A follow up appt was made for insurance purposes with Cecille Rubin, NP on Aug 31 2016. Pt verbalized understanding to arrive 15 minutes early and bring their CPAP. A letter with all of this information in it will be mailed to the pt as a reminder. I verified with the pt that the address we have on file is correct. Pt verbalized understanding of results. Pt had no questions at this time but was encouraged to call back if questions arise.

## 2017-06-14 NOTE — Telephone Encounter (Signed)
Called the patient back to make them aware that after talking with Dr Dohmeier she would prefer to bring him in the the Bipap titration study first and then we will have better idea of what pressure to place the patient on. pts wife answered and was agreeable to this plan. At this time we will keep the patient's apt in the event that we are able to get him set up in time. If not then we will push the apt out further to make sure that we have the appropriate time frame of data. Pt will expect a call from our sleep lab to get them set up. Pt verbalized understanding. Pt had no questions at this time but was encouraged to call back if questions arise.

## 2017-06-20 ENCOUNTER — Ambulatory Visit
Admission: RE | Admit: 2017-06-20 | Discharge: 2017-06-20 | Disposition: A | Discharge status: Home / Self Care | Payer: Medicare Other | Source: Ambulatory Visit | Attending: Neurosurgery | Admitting: Neurosurgery

## 2017-06-20 ENCOUNTER — Other Ambulatory Visit: Payer: Self-pay

## 2017-06-20 DIAGNOSIS — M4310 Spondylolisthesis, site unspecified: Secondary | ICD-10-CM

## 2017-06-20 DIAGNOSIS — M5136 Other intervertebral disc degeneration, lumbar region: Secondary | ICD-10-CM | POA: Diagnosis not present

## 2017-06-21 DIAGNOSIS — L409 Psoriasis, unspecified: Secondary | ICD-10-CM | POA: Diagnosis not present

## 2017-06-21 DIAGNOSIS — G629 Polyneuropathy, unspecified: Secondary | ICD-10-CM | POA: Diagnosis not present

## 2017-06-21 DIAGNOSIS — M2550 Pain in unspecified joint: Secondary | ICD-10-CM | POA: Diagnosis not present

## 2017-06-21 DIAGNOSIS — M7989 Other specified soft tissue disorders: Secondary | ICD-10-CM | POA: Diagnosis not present

## 2017-06-21 DIAGNOSIS — E669 Obesity, unspecified: Secondary | ICD-10-CM | POA: Diagnosis not present

## 2017-06-21 DIAGNOSIS — M150 Primary generalized (osteo)arthritis: Secondary | ICD-10-CM | POA: Diagnosis not present

## 2017-06-21 DIAGNOSIS — Z6835 Body mass index (BMI) 35.0-35.9, adult: Secondary | ICD-10-CM | POA: Diagnosis not present

## 2017-06-21 DIAGNOSIS — E1142 Type 2 diabetes mellitus with diabetic polyneuropathy: Secondary | ICD-10-CM | POA: Diagnosis not present

## 2017-06-21 DIAGNOSIS — M5030 Other cervical disc degeneration, unspecified cervical region: Secondary | ICD-10-CM | POA: Diagnosis not present

## 2017-06-21 DIAGNOSIS — L4050 Arthropathic psoriasis, unspecified: Secondary | ICD-10-CM | POA: Diagnosis not present

## 2017-06-21 DIAGNOSIS — M5136 Other intervertebral disc degeneration, lumbar region: Secondary | ICD-10-CM | POA: Diagnosis not present

## 2017-06-25 DIAGNOSIS — E291 Testicular hypofunction: Secondary | ICD-10-CM | POA: Diagnosis not present

## 2017-07-04 ENCOUNTER — Ambulatory Visit (INDEPENDENT_AMBULATORY_CARE_PROVIDER_SITE_OTHER): Payer: Medicare Other | Admitting: Neurology

## 2017-07-04 DIAGNOSIS — R002 Palpitations: Secondary | ICD-10-CM

## 2017-07-04 DIAGNOSIS — G4731 Primary central sleep apnea: Secondary | ICD-10-CM

## 2017-07-04 DIAGNOSIS — G903 Multi-system degeneration of the autonomic nervous system: Secondary | ICD-10-CM

## 2017-07-04 DIAGNOSIS — I951 Orthostatic hypotension: Secondary | ICD-10-CM

## 2017-07-04 DIAGNOSIS — I2510 Atherosclerotic heart disease of native coronary artery without angina pectoris: Secondary | ICD-10-CM

## 2017-07-04 DIAGNOSIS — G4739 Other sleep apnea: Secondary | ICD-10-CM

## 2017-07-04 DIAGNOSIS — G4719 Other hypersomnia: Secondary | ICD-10-CM

## 2017-07-04 DIAGNOSIS — I2129 ST elevation (STEMI) myocardial infarction involving other sites: Secondary | ICD-10-CM

## 2017-07-10 ENCOUNTER — Other Ambulatory Visit: Payer: Self-pay | Admitting: Neurology

## 2017-07-10 DIAGNOSIS — M4716 Other spondylosis with myelopathy, lumbar region: Secondary | ICD-10-CM | POA: Diagnosis not present

## 2017-07-10 DIAGNOSIS — G903 Multi-system degeneration of the autonomic nervous system: Secondary | ICD-10-CM

## 2017-07-10 DIAGNOSIS — I2510 Atherosclerotic heart disease of native coronary artery without angina pectoris: Secondary | ICD-10-CM

## 2017-07-10 DIAGNOSIS — G4731 Primary central sleep apnea: Secondary | ICD-10-CM

## 2017-07-10 DIAGNOSIS — G4739 Other sleep apnea: Secondary | ICD-10-CM | POA: Insufficient documentation

## 2017-07-10 DIAGNOSIS — I951 Orthostatic hypotension: Secondary | ICD-10-CM

## 2017-07-10 DIAGNOSIS — I219 Acute myocardial infarction, unspecified: Secondary | ICD-10-CM | POA: Insufficient documentation

## 2017-07-10 DIAGNOSIS — I2102 ST elevation (STEMI) myocardial infarction involving left anterior descending coronary artery: Secondary | ICD-10-CM

## 2017-07-10 DIAGNOSIS — M4316 Spondylolisthesis, lumbar region: Secondary | ICD-10-CM | POA: Diagnosis not present

## 2017-07-10 DIAGNOSIS — R61 Generalized hyperhidrosis: Secondary | ICD-10-CM

## 2017-07-10 NOTE — Procedures (Signed)
PATIENT'S NAME:  Devin, Combs DOB:      1951-02-05      MR#:    [id number]     DATE OF RECORDING: 07/04/2017 REFERRING M.D.:  Maury Dus, M.D. Study Performed:   Titration to BiPAP and ASV. HISTORY:  Mr. Hashemi returns for PAP titration to address residual and treatment emergent central apnea found in previous split night study from 06/08/2017. The baseline study found an AHI of 64.1/hr., not responding to CPAP but partially reduced under BiPAP.  The patient endorsed the Epworth Sleepiness Scale at 10 points.   The patient's weight 265 pounds with a height of 72 (inches), resulting in a BMI of 35.8 kg/m2. The patient's neck circumference measured 20 inches.  CURRENT MEDICATIONS: Drysol, Prevagen, Valium, Cymbalta, Fentanyl, Microzide, Metformin, Robaxin, Prilosec, Oxycodone, Afrin, Crestor, Valtrex, Lisinopril    PROCEDURE:  This is a multichannel digital polysomnogram utilizing the SomnoStar 11.2 system.  Electrodes and sensors were applied and monitored per AASM Specifications.   EEG, EOG, Chin and Limb EMG, were sampled at 200 Hz.  ECG, Snore and Nasal Pressure, Thermal Airflow, Respiratory Effort, CPAP Flow and Pressure, Oximetry was sampled at 50 Hz. Digital video and audio were recorded.      BiPAP was initiated at 10/6 cm and still produced an AHI of 47.5! Technician switched to ASV at 15 cm maximum pressure support ( max PS) , 3 cm water minimum pressure support ( min PS)  and 6 cm water EPAP and immediately achieved control of apnea, AHI of 2.1/hr.   At a final ASV pressure of 15 max PS/ 4 cm min PS and 6 cm EPAP there was a complete resolution of the apnea to AHI 0.0, for a sleep time of 211 minutes.    Lights Out was at 22:37 and Lights On at 05:00. Total recording time (TRT) was 383 minutes, with a total sleep time (TST) of 264.5 minutes. The patient's sleep latency was 58.5 minutes with 0.5 minutes of wake time after sleep onset. REM latency was 221 minutes.  The sleep  efficiency was 69.1 %.    SLEEP ARCHITECTURE: WASO (Wake after sleep onset) was 67.5 minutes.  There were 27.5 minutes in Stage N1, 78.5 minutes Stage N2, 150 minutes Stage N3 and 8.5 minutes in Stage REM.  The percentage of Stage N1 was 10.4%, Stage N2 was 29.7%, Stage N3 was 56.7% and Stage R (REM sleep) was 3.2%.  RESPIRATORY ANALYSIS:  There was a total of 20 respiratory events: 0 obstructive apneas, 20 central apneas and  0 hypopneas with 0 respiratory event related arousals (RERAs).     The total APNEA/HYPOPNEA INDEX  (AHI) was 4.5 /hour and the total RESPIRATORY DISTURBANCE INDEX was 4.5/ hour  0 events occurred in REM sleep and 20 events in NREM. The REM AHI was 0 /hour versus a non-REM AHI of 4.7/hour.  The patient spent 19 minutes of total sleep time in the supine position and 246 minutes in non-supine. The supine AHI was 0.0, versus a non-supine AHI of 4.9.  OXYGEN SATURATION & C02:  The baseline 02 saturation was 96%, with the lowest being 89%. Time spent below 89% saturation equaled 0 minutes.  PERIODIC LIMB MOVEMENTS:   The patient had a total of 0 Periodic Limb Movements. The arousals were noted as: 25 were spontaneous, 0 were associated with PLMs, and 2 were associated with respiratory events (central apnea).Audio and video analysis did not show any abnormal or unusual movements, behaviors, phonations or vocalizations.  EKG was in keeping with normal sinus rhythm (NSR).  Post-study, the patient indicated that sleep was the same as usual, except for air leaking form the mask.  DIAGNOSIS 1. Complex and Central Sleep Apnea, responding to ASV after failing CPAP and BiPAP / ST titration.    PLANS/RECOMMENDATIONS: 1. ASV at 15 cm water max pressure support, 4 cm minimum pressure support and and 6 cm water pressure EPAP. The patient was fitted with a [employer] Eson nasal mask in medium size. A follow up appointment will be scheduled in the Sleep Clinic at [facility].   Please call [phone] with any questions.      I certify that I have reviewed the entire raw data recording prior to the issuance of this report in accordance with the Standards of Accreditation of the American Academy of Sleep Medicine (AASM)      Larey Seat, M.D.   07-10-2017  Diplomat, American Board of Psychiatry and Neurology  Diplomat, [place] of Sleep Medicine Medical Director, [place] [facility]

## 2017-07-11 ENCOUNTER — Telehealth: Payer: Self-pay | Admitting: Neurology

## 2017-07-11 NOTE — Telephone Encounter (Signed)
-----   Message from Larey Seat, MD sent at 07/10/2017  4:36 PM EST ----- This complex apnea was very tricky to treat, failed CPAP and BiPAP, and responded finally, exquisitely, to ASV.  PLANS/RECOMMENDATIONS: 1. ASV at 15 cm water max pressure support, 4 cm minimum pressure  support and and 6 cm water pressure EPAP. The patient was fitted  with a [employer] Eson nasal mask in medium size.

## 2017-07-11 NOTE — Telephone Encounter (Signed)
Called patient to discuss sleep study results. No answer at this time. LVM for the patient to call back.   

## 2017-07-12 ENCOUNTER — Other Ambulatory Visit: Payer: Self-pay | Admitting: Neurosurgery

## 2017-07-12 ENCOUNTER — Other Ambulatory Visit: Payer: Self-pay | Admitting: Neurology

## 2017-07-12 ENCOUNTER — Telehealth: Payer: Self-pay | Admitting: Neurology

## 2017-07-12 DIAGNOSIS — M4316 Spondylolisthesis, lumbar region: Secondary | ICD-10-CM

## 2017-07-12 NOTE — Telephone Encounter (Signed)
PT RETURNED call. I advised pt that Dr. Brett Fairy reviewed their sleep study results and found that pt has complex apnea. Dr. Brett Fairy recommends that pt . I reviewed PAP compliance expectations with the pt. Pt is agreeable to starting an ASV. I advised pt that an order will be sent to a DME, [facility], and [facility] will call the pt within about one week after they file with the pt's insurance. [facility] will show the pt how to use the machine, fit for masks, and troubleshoot the ASV if needed. A follow up appt was made for insurance purposes with Dr. Brett Fairy on Oct 09, 2017 at 8:30 am . Pt verbalized understanding to arrive 15 minutes early and bring their ASV. A letter with all of this information in it will be mailed to the pt as a reminder. I verified with the pt that the address we have on file is correct. Pt verbalized understanding of results. Pt had no questions at this time but was encouraged to call back if questions arise.

## 2017-07-12 NOTE — Telephone Encounter (Signed)
Pt returned your call please call him back at home number dg

## 2017-07-12 NOTE — Telephone Encounter (Signed)
2nd attempt to call the patient. lvm for the patient to call back

## 2017-07-20 ENCOUNTER — Ambulatory Visit
Admission: RE | Admit: 2017-07-20 | Discharge: 2017-07-20 | Disposition: A | Discharge status: Home / Self Care | Payer: Medicare Other | Source: Ambulatory Visit | Attending: Neurosurgery | Admitting: Neurosurgery

## 2017-07-20 DIAGNOSIS — M4316 Spondylolisthesis, lumbar region: Secondary | ICD-10-CM

## 2017-07-20 DIAGNOSIS — M48061 Spinal stenosis, lumbar region without neurogenic claudication: Secondary | ICD-10-CM | POA: Diagnosis not present

## 2017-08-31 ENCOUNTER — Ambulatory Visit: Payer: Self-pay | Admitting: Nurse Practitioner

## 2017-09-03 ENCOUNTER — Encounter: Payer: Self-pay | Admitting: Neurology

## 2017-09-03 ENCOUNTER — Ambulatory Visit (INDEPENDENT_AMBULATORY_CARE_PROVIDER_SITE_OTHER): Payer: Medicare Other | Admitting: Neurology

## 2017-09-03 ENCOUNTER — Telehealth: Payer: Self-pay | Admitting: Neurology

## 2017-09-03 VITALS — BP 113/76 | HR 100 | Ht 72.0 in | Wt 259.0 lb

## 2017-09-03 DIAGNOSIS — G4731 Primary central sleep apnea: Secondary | ICD-10-CM | POA: Diagnosis not present

## 2017-09-03 NOTE — Patient Instructions (Addendum)
[phone] - call to schedule MRI brain and cervical Spine   Follow up with Dr. Brett Fairy and then Ward Givens 3-6 months afterwards   Sleep Apnea Sleep apnea is a condition in which breathing pauses or becomes shallow during sleep. Episodes of sleep apnea usually last 10 seconds or longer, and they may occur as many as 20 times an hour. Sleep apnea disrupts your sleep and keeps your body from getting the rest that it needs. This condition can increase your risk of certain health problems, including:  Heart attack.  Stroke.  Obesity.  Diabetes.  Heart failure.  Irregular heartbeat.  There are three kinds of sleep apnea:  Obstructive sleep apnea. This kind is caused by a blocked or collapsed airway.  Central sleep apnea. This kind happens when the part of the brain that controls breathing does not send the correct signals to the muscles that control breathing.  Mixed sleep apnea. This is a combination of obstructive and central sleep apnea.  What are the causes? The most common cause of this condition is a collapsed or blocked airway. An airway can collapse or become blocked if:  Your throat muscles are abnormally relaxed.  Your tongue and tonsils are larger than normal.  You are overweight.  Your airway is smaller than normal.  What increases the risk? This condition is more likely to develop in people who:  Are overweight.  Smoke.  Have a smaller than normal airway.  Are elderly.  Are male.  Drink alcohol.  Take sedatives or tranquilizers.  Have a family history of sleep apnea.  What are the signs or symptoms? Symptoms of this condition include:  Trouble staying asleep.  Daytime sleepiness and tiredness.  Irritability.  Loud snoring.  Morning headaches.  Trouble concentrating.  Forgetfulness.  Decreased interest in sex.  Unexplained sleepiness.  Mood swings.  Personality changes.  Feelings of depression.  Waking up often  during the night to urinate.  Dry mouth.  Sore throat.  How is this diagnosed? This condition may be diagnosed with:  A medical history.  A physical exam.  A series of tests that are done while you are sleeping (sleep study). These tests are usually done in a sleep lab, but they may also be done at home.  How is this treated? Treatment for this condition aims to restore normal breathing and to ease symptoms during sleep. It may involve managing health issues that can affect breathing, such as high blood pressure or obesity. Treatment may include:  Sleeping on your side.  Using a decongestant if you have nasal congestion.  Avoiding the use of depressants, including alcohol, sedatives, and narcotics.  Losing weight if you are overweight.  Making changes to your diet.  Quitting smoking.  Using a device to open your airway while you sleep, such as: ? An oral appliance. This is a custom-made mouthpiece that shifts your lower jaw forward. ? A continuous positive airway pressure (CPAP) device. This device delivers oxygen to your airway through a mask. ? A nasal expiratory positive airway pressure (EPAP) device. This device has valves that you put into each nostril. ? A bi-level positive airway pressure (BPAP) device. This device delivers oxygen to your airway through a mask.  Surgery if other treatments do not work. During surgery, excess tissue is removed to create a wider airway.  It is important to get treatment for sleep apnea. Without treatment, this condition can lead to:  High blood pressure.  Coronary artery disease.  (  Men) An inability to achieve or maintain an erection (impotence).  Reduced thinking abilities.  Follow these instructions at home:  Make any lifestyle changes that your health care provider recommends.  Eat a healthy, well-balanced diet.  Take over-the-counter and prescription medicines only as told by your health care provider.  Avoid using  depressants, including alcohol, sedatives, and narcotics.  Take steps to lose weight if you are overweight.  If you were given a device to open your airway while you sleep, use it only as told by your health care provider.  Do not use any tobacco products, such as cigarettes, chewing tobacco, and e-cigarettes. If you need help quitting, ask your health care provider.  Keep all follow-up visits as told by your health care provider. This is important. Contact a health care provider if:  The device that you received to open your airway during sleep is uncomfortable or does not seem to be working.  Your symptoms do not improve.  Your symptoms get worse. Get help right away if:  You develop chest pain.  You develop shortness of breath.  You develop discomfort in your back, arms, or stomach.  You have trouble speaking.  You have weakness on one side of your body.  You have drooping in your face. These symptoms may represent a serious problem that is an emergency. Do not wait to see if the symptoms will go away. Get medical help right away. Call your local emergency services (911 in the [place] Do not drive yourself to the hospital. This information is not intended to replace advice given to you by your health care provider. Make sure you discuss any questions you have with your health care provider. Document Released: 07/21/2002 Document Revised: 03/26/2016 Document Reviewed: 05/10/2015 Elsevier Interactive Patient Education  [employer].

## 2017-09-03 NOTE — Progress Notes (Signed)
[facility]    Provider:  Dr Jaynee Eagles Referring Provider: Maury Dus, MD Primary Care Physician:  Maury Dus, MD  CC:  Diabetic autonomic neuropathy and orthostatic hypotension  Interval history 09/04/2017: Since last appointment patient underwent sleep test and was diagnosed with severe complex sleep apnea.  He has not started using his machine yet, explained this could definitely be a cause of his memory loss and fatigue.  Can also cause dizziness and other symptoms including increased risk of stroke and cardiovascular, pulmonary disorders and others.  He needs to use this machine for at least 6 months or a year.  His brain fog will improve it.  His short-term memory will improve.  Discussed with the sleep lab, they are checking up on the delivery of his apnea equipment.  HPI:  Devin Combs is a 67 y.o. male here as a referral from Dr. Alyson Ingles for neuropathy. Has a past medical history of type 2 diabetes, neuropathy, low back pain, anxiety, obstructive sleep apnea, hypogonadism, myalgia, hypertension, diabetic peripheral neuropathy, hyperlipidemia, major depression, irritable bowel syndrome, dizziness, erectile dysfunction, orthostatic hypotension, morbid obesity. Wife provides much information. In January 2017 he had a colonoscopy, he had diarrhea afterwards and also started to have other symptoms such as dizziness, having episodes of pre-syncope. He has had diabetes for over 30 years. He has orthostatic hypotension, dizziness, lightheadedness, fainting, fatigue, blurry vision, dry eyes, dry mouth, weakness, trouble concentrating/brain fog, dry mouth, weakness, head and neck pain, urinary retention, constipation, excessive seating, dysphonia, leg weak muscles. He is having memory loss and short-term memory. He is having orthostatic hypotension. He has seen cardiology. He has brain fog. Difficulty with passwords. Short-term memory for a year and worsening over the last few  onths. Symptoms wax and wane. He lost track of one of his diabetic drugs, altered mentation. He has significant imbalance.  Reviewed notes, labs and imaging from outside physicians, which showed:  Reviewed notes for peripheral neuropathy, also associated muscle atrophy in the patient's thighs and arms, patient does not exercise regularly in fact rarely, rosuvastatin made him feel loopy, LDL cholesterol was greater than 190 off of the medication, he has irritable bowel syndrome, patient reports per cc in his feet and right knee, currently maintained on Cymbalta 60 mg one daily and is also using a fentanyl patch for treatment of this concern, 90% effective at controlling his numbness and tingling, also with depression,, diabetic peripheral neuropathy, please evaluate for peripheral neuropathy and associated muscle atrophy in the patient's thighs and arms.  CBC with differential was collected July 2018 and was unremarkable, CMP showed elevated glucose, BUN 18, creatinine 1.02 otherwise  unremarkable, LDL 178, hemoglobin A1c 6.8 in July 2018. vitamin D 40, TSH normal, rheumatoid arthritis normal sedimentation rate normal, ANA negative. B12 437.   Review of Systems: Patient complains of symptoms per HPI as well as the following symptoms: Weight loss, fatigue, blurred vision, snoring, feeling hot, flushing, joint pain, confusion, weakness, dizziness, tremor, depression, anxiety, sleepiness, snoring, decreased energy, change in appetite, urination problems. Pertinent negatives and positives per HPI. All others negative.     Social History   Socioeconomic History  . Marital status: Married    Spouse name: Not on file  . Number of children: 1  . Years of education: Not on file  . Highest education level: Some college, no degree  Social Needs  . Financial resource strain: Not on file  . Food insecurity - worry: Not on file  . Food insecurity -  inability: Not on file  . Transportation needs -  medical: Not on file  . Transportation needs - non-medical: Not on file  Occupational History  . Not on file  Tobacco Use  . Smoking status: Never Smoker  . Smokeless tobacco: Never Used  Substance and Sexual Activity  . Alcohol use: Yes    Comment: rare  . Drug use: No  . Sexual activity: Not on file  Other Topics Concern  . Not on file  Social History Narrative   Lives at home with wife, who is Therapist, sports.  Pt is retired.  Caffeine 2-3 cups per week. Right handed.     Family History  Problem Relation Age of Onset  . Neuropathy Neg Hx     Past Medical History:  Diagnosis Date  . Anxiety   . Arthritis   . Cancer (HCC)    basal and squamous cell carcinoma  . Chronic back pain   . DDD (degenerative disc disease), thoracolumbar   . Degenerative cervical spinal stenosis   . Depression   . Diabetic autonomic neuropathy ([place])   . Dyspnea    with exertion   . GERD (gastroesophageal reflux disease)   . Headache   . History of bronchitis   . History of kidney stones   . Hyperlipidemia   . Hypertension   . Hypogonadism in male   . Myocardial infarction ([place])   . Neuropathy   . Orthostatic hypotension   . OSA (obstructive sleep apnea)    per pt study done 2006 (approx)  used cpap few yrs then stopped using, stated didn't feel he needed it anymore  . Psoriasis   . Retinal tear   . Right knee meniscal tear   . Type 2 diabetes mellitus ([place])   . Urinary hesitancy   . Vitamin D deficiency     Past Surgical History:  Procedure Laterality Date  . CATARACT EXTRACTION W/ INTRAOCULAR LENS  IMPLANT, BILATERAL  right 2015//  left 2012  . CORRECTION REPAIR MULTIPLE  TOES , LEFT FOOT  YRS AGO  . EXTRACORPOREAL SHOCK WAVE LITHOTRIPSY  2000 approx  . KNEE ARTHROSCOPY Left 2007  . KNEE ARTHROSCOPY Right 02/10/2015   Procedure: ARTHROSCOPY RIGHT KNEE WITH DEBRIDEMENT AND CHRONDROPLASTY;  Surgeon: Gaynelle Arabian, MD;  Location: [facility];  Service: Orthopedics;  Laterality:  Right;  . PARTIAL KNEE ARTHROPLASTY Right 07/19/2016   Procedure: RIGHT KNEE MEDICAL UNICOMPARTMENTAL ARTHROPLASTY;  Surgeon: Gaynelle Arabian, MD;  Location: [facility];  Service: Orthopedics;  Laterality: Right;  . RADICAL ORCHIECTOMY Left 1996   benign  . REMOVAL SPURS RIGHT GREAT TOE  YRS AGO  . TOTAL KNEE ARTHROPLASTY Left 09-16-2007    Current Outpatient Medications  Medication Sig Dispense Refill  . Alpha-D-Galactosidase (BEANO PO) Take 1 tablet by mouth as needed.    Marland Kitchen aluminum chloride (DRYSOL) 20 % external solution Apply 1 application topically every 7 (seven) days.     Marland Kitchen Apoaequorin (PREVAGEN) 10 MG CAPS Take 1 capsule by mouth daily.    . diazepam (VALIUM) 5 MG tablet Take 1 tablet (5 mg total) by mouth every 6 (six) hours as needed. AS NEEDED FOR MUSCLE SPASMS 80 tablet 1  . DULoxetine (CYMBALTA) 60 MG capsule Take 60 mg by mouth every evening.    . fentaNYL (DURAGESIC - DOSED MCG/HR) 75 MCG/HR Place 75 mcg onto the skin every 3 (three) days.     . hydrochlorothiazide (MICROZIDE) 12.5 MG capsule TAKE 1 CAPSULE (12.5 MG TOTAL) EVERY MORNING  AS NEEDED FOR A BLOOD PRESSURE GREATER THAN 180. 30 capsule 6  . metFORMIN (GLUCOPHAGE) 1000 MG tablet Take 1,000 mg by mouth 2 (two) times daily with a meal.    . methocarbamol (ROBAXIN) 500 MG tablet Take 1 tablet (500 mg total) by mouth 4 (four) times daily. (Patient taking differently: Take 500 mg by mouth every 6 (six) hours as needed for muscle spasms. ) 30 tablet 1  . omeprazole (PRILOSEC) 20 MG capsule Take 20 mg by mouth every morning.  5  . oxyCODONE (OXY IR/ROXICODONE) 5 MG immediate release tablet Take 1-4 tablets (5-20 mg total) by mouth every 3 (three) hours as needed for moderate pain or severe pain. 90 tablet 0  . oxymetazoline (AFRIN) 0.05 % nasal spray Place 1 spray into both nostrils 2 (two) times daily as needed for congestion.    . rosuvastatin (CRESTOR) 20 MG tablet Take 20 mg by mouth. Takes once weekly.    . valACYclovir  (VALTREX) 1000 MG tablet Take 1,000 mg by mouth daily as needed (outbreaks).     Marland Kitchen lisinopril (PRINIVIL,ZESTRIL) 40 MG tablet Take 1 tablet (40 mg total) by mouth every evening. 90 tablet 3   No current facility-administered medications for this visit.     Allergies as of 09/03/2017  . (No Known Allergies)    Vitals: BP 113/76 (BP Location: Right Arm, Patient Position: Sitting)   Pulse 100   Ht 6' (1.829 m)   Wt 259 lb (117.5 kg)   BMI 35.13 kg/m  Last Weight:  Wt Readings from Last 1 Encounters:  09/03/17 259 lb (117.5 kg)   Last Height:   Ht Readings from Last 1 Encounters:  09/03/17 6' (1.829 m)    Assessment/Plan:  67 year old male with multiple neurologic symptoms including severe distal large and small fiber neuropathy, likely diabetic but need to evaluate for other causes. Also reports fatigue, and other symptoms suspicious for autonomic neuropathy including dizziness, erectile dysfunction, orthostatic hypotension, irritable bowel syndrome, dizziness, lightheadedness, blurry vision, dry eyes and other symptoms. The extensive workup for other causes of his symptoms including obstructive sleep apnea, other causes of neuropathy. past medical history of obstructive sleep apnea and treated at this time needs repeat sleep evaluation.  - Sleep evaluation revealed severe complex untreated sleep apnea which could definitely be contributing to many of his symptoms including fatigue and brain fog.  He is in the process of receiving his apnea equipment.  Discussed results in with a mean.  - MRI brain due to new onset headaches after the age of 16, positional headaches to evaluate for masses, lesions or other etiology.  They have not scheduled these yet, given the phone number to call [facility] imaging back.  - MRI of the cervical spine due to imbalance, ataxia, neck pain and radicular symptoms to evaluate for cervical myelopathy.  If not scheduled this yet, gave them the phone number  to call [facility] imaging back to schedule  - Peripheral neuropathy: Likely diabetic, other lab tests unremarkable for causes.  Diabetic autonomic neuropathy can also cause a lot of his other symptoms as well.  - Erythema and discoloration distal in the left lower extremity with breakdown of the skin at the lateral malleolus, worse when dependent improved with raising leg, becomes cold, likely peripheral vascular disease f/u with pcp  - Imbalance, fall risk, ataxia: Physical therapy, fall precautions  - Autonomic neuropathy: Will check labs for other causes, some symptoms may be due to medications or untreated sleep apnea as  well as diabetes which can affect the autonomic system, May consider checking a paraneoplastic panel as well as autoantibodies to ganglionic nicotinic acetylcholine receptors (gAChR) in future  - Given vascular risk factors and presyncope, may consider imaging of the blood vessels of the head and neck      Sarina Ill, MD  [facility] [address] [place],  [address]  Phone [phone] Fax [phone]  A total of 25  minutes was spent face-to-face with this patient. Over half this time was spent on counseling patient on the sleep apnea diagnosis and different diagnostic and therapeutic options available.

## 2017-09-03 NOTE — Telephone Encounter (Signed)
Pts wife is wanting [place] to fax over pts sleep results to Dr. Maury Dus at [place] [facility] Phone # [phone] fax # [phone]

## 2017-09-04 DIAGNOSIS — M109 Gout, unspecified: Secondary | ICD-10-CM | POA: Diagnosis not present

## 2017-09-04 DIAGNOSIS — Z7984 Long term (current) use of oral hypoglycemic drugs: Secondary | ICD-10-CM | POA: Diagnosis not present

## 2017-09-04 DIAGNOSIS — Z23 Encounter for immunization: Secondary | ICD-10-CM | POA: Diagnosis not present

## 2017-09-04 DIAGNOSIS — I951 Orthostatic hypotension: Secondary | ICD-10-CM | POA: Diagnosis not present

## 2017-09-04 DIAGNOSIS — I10 Essential (primary) hypertension: Secondary | ICD-10-CM | POA: Diagnosis not present

## 2017-09-04 DIAGNOSIS — F324 Major depressive disorder, single episode, in partial remission: Secondary | ICD-10-CM | POA: Diagnosis not present

## 2017-09-04 DIAGNOSIS — K580 Irritable bowel syndrome with diarrhea: Secondary | ICD-10-CM | POA: Diagnosis not present

## 2017-09-04 DIAGNOSIS — E1141 Type 2 diabetes mellitus with diabetic mononeuropathy: Secondary | ICD-10-CM | POA: Diagnosis not present

## 2017-09-04 DIAGNOSIS — K219 Gastro-esophageal reflux disease without esophagitis: Secondary | ICD-10-CM | POA: Diagnosis not present

## 2017-09-04 DIAGNOSIS — E291 Testicular hypofunction: Secondary | ICD-10-CM | POA: Diagnosis not present

## 2017-09-04 DIAGNOSIS — E559 Vitamin D deficiency, unspecified: Secondary | ICD-10-CM | POA: Diagnosis not present

## 2017-09-04 DIAGNOSIS — Z0000 Encounter for general adult medical examination without abnormal findings: Secondary | ICD-10-CM | POA: Diagnosis not present

## 2017-09-04 DIAGNOSIS — E7800 Pure hypercholesterolemia, unspecified: Secondary | ICD-10-CM | POA: Diagnosis not present

## 2017-09-04 NOTE — Telephone Encounter (Signed)
Sleep study faxed on 09/04/17

## 2017-09-12 ENCOUNTER — Other Ambulatory Visit: Payer: Self-pay | Admitting: *Deleted

## 2017-09-12 DIAGNOSIS — M4716 Other spondylosis with myelopathy, lumbar region: Secondary | ICD-10-CM | POA: Diagnosis not present

## 2017-09-12 DIAGNOSIS — M48062 Spinal stenosis, lumbar region with neurogenic claudication: Secondary | ICD-10-CM | POA: Diagnosis not present

## 2017-09-12 DIAGNOSIS — M4316 Spondylolisthesis, lumbar region: Secondary | ICD-10-CM | POA: Diagnosis not present

## 2017-09-12 MED ORDER — LISINOPRIL 40 MG PO TABS: 40.0000 mg | ORAL_TABLET | Freq: Every evening | ORAL | 0 refills | Status: DC

## 2017-09-18 ENCOUNTER — Other Ambulatory Visit: Payer: Self-pay | Admitting: Neurosurgery

## 2017-09-18 DIAGNOSIS — R52 Pain, unspecified: Secondary | ICD-10-CM

## 2017-09-19 ENCOUNTER — Ambulatory Visit
Admission: RE | Admit: 2017-09-19 | Discharge: 2017-09-19 | Disposition: A | Discharge status: Home / Self Care | Payer: Medicare Other | Source: Ambulatory Visit | Attending: Neurosurgery | Admitting: Neurosurgery

## 2017-09-19 ENCOUNTER — Ambulatory Visit
Admission: RE | Admit: 2017-09-19 | Discharge: 2017-09-19 | Disposition: A | Discharge status: Home / Self Care | Payer: Medicare Other | Source: Ambulatory Visit | Attending: Neurology | Admitting: Neurology

## 2017-09-19 DIAGNOSIS — G909 Disorder of the autonomic nervous system, unspecified: Secondary | ICD-10-CM

## 2017-09-19 DIAGNOSIS — G4719 Other hypersomnia: Secondary | ICD-10-CM

## 2017-09-19 DIAGNOSIS — R519 Headache, unspecified: Secondary | ICD-10-CM

## 2017-09-19 DIAGNOSIS — M4712 Other spondylosis with myelopathy, cervical region: Secondary | ICD-10-CM

## 2017-09-19 DIAGNOSIS — R2689 Other abnormalities of gait and mobility: Secondary | ICD-10-CM

## 2017-09-19 DIAGNOSIS — R42 Dizziness and giddiness: Secondary | ICD-10-CM

## 2017-09-19 DIAGNOSIS — R52 Pain, unspecified: Secondary | ICD-10-CM

## 2017-09-19 DIAGNOSIS — M4722 Other spondylosis with radiculopathy, cervical region: Secondary | ICD-10-CM

## 2017-09-19 DIAGNOSIS — R410 Disorientation, unspecified: Secondary | ICD-10-CM

## 2017-09-19 DIAGNOSIS — R269 Unspecified abnormalities of gait and mobility: Secondary | ICD-10-CM

## 2017-09-19 DIAGNOSIS — R0681 Apnea, not elsewhere classified: Secondary | ICD-10-CM

## 2017-09-19 DIAGNOSIS — R55 Syncope and collapse: Secondary | ICD-10-CM

## 2017-09-19 DIAGNOSIS — Z9181 History of falling: Secondary | ICD-10-CM

## 2017-09-19 DIAGNOSIS — R51 Headache: Secondary | ICD-10-CM

## 2017-09-19 DIAGNOSIS — G629 Polyneuropathy, unspecified: Secondary | ICD-10-CM

## 2017-09-19 DIAGNOSIS — R0683 Snoring: Secondary | ICD-10-CM

## 2017-09-19 DIAGNOSIS — R531 Weakness: Secondary | ICD-10-CM

## 2017-09-19 DIAGNOSIS — M542 Cervicalgia: Secondary | ICD-10-CM | POA: Diagnosis not present

## 2017-09-19 DIAGNOSIS — H539 Unspecified visual disturbance: Secondary | ICD-10-CM

## 2017-09-19 DIAGNOSIS — M47814 Spondylosis without myelopathy or radiculopathy, thoracic region: Secondary | ICD-10-CM | POA: Diagnosis not present

## 2017-09-19 DIAGNOSIS — R29818 Other symptoms and signs involving the nervous system: Secondary | ICD-10-CM

## 2017-09-27 ENCOUNTER — Telehealth: Payer: Self-pay | Admitting: *Deleted

## 2017-09-27 DIAGNOSIS — E860 Dehydration: Secondary | ICD-10-CM | POA: Diagnosis not present

## 2017-09-27 NOTE — Telephone Encounter (Signed)
-----   Message from Melvenia Beam, MD sent at 09/26/2017  5:49 PM EST ----- MRI of the cervical spine shows no significant worsening since he last had an mri in 2006. He has degenerative arthritis. But his spinal cord looks normal no reason for his ataxia/imbalance seen in the cervical spinal cord. thanks

## 2017-09-27 NOTE — Telephone Encounter (Signed)
LMOM (identified vm) with below MRI report.  He does not need to return this call unless he has questions/fim

## 2017-09-27 NOTE — Telephone Encounter (Signed)
-----   Message from Melvenia Beam, MD sent at 09/26/2017  5:47 PM EST ----- MRI of the brain showed no etiology for his headaches, no masses and no strokes. He has some atrophy which can be seen in normal aging. thanks

## 2017-10-08 ENCOUNTER — Other Ambulatory Visit: Payer: Self-pay | Admitting: Internal Medicine

## 2017-10-09 ENCOUNTER — Ambulatory Visit: Payer: Self-pay | Admitting: Neurology

## 2017-10-12 DIAGNOSIS — M48062 Spinal stenosis, lumbar region with neurogenic claudication: Secondary | ICD-10-CM | POA: Diagnosis not present

## 2017-10-12 DIAGNOSIS — M4716 Other spondylosis with myelopathy, lumbar region: Secondary | ICD-10-CM | POA: Diagnosis not present

## 2017-10-12 DIAGNOSIS — M4316 Spondylolisthesis, lumbar region: Secondary | ICD-10-CM | POA: Diagnosis not present

## 2017-11-13 DIAGNOSIS — Z6837 Body mass index (BMI) 37.0-37.9, adult: Secondary | ICD-10-CM | POA: Diagnosis not present

## 2017-11-13 DIAGNOSIS — M5030 Other cervical disc degeneration, unspecified cervical region: Secondary | ICD-10-CM | POA: Diagnosis not present

## 2017-11-13 DIAGNOSIS — E1142 Type 2 diabetes mellitus with diabetic polyneuropathy: Secondary | ICD-10-CM | POA: Diagnosis not present

## 2017-11-13 DIAGNOSIS — M5136 Other intervertebral disc degeneration, lumbar region: Secondary | ICD-10-CM | POA: Diagnosis not present

## 2017-11-13 DIAGNOSIS — G629 Polyneuropathy, unspecified: Secondary | ICD-10-CM | POA: Diagnosis not present

## 2017-11-13 DIAGNOSIS — E669 Obesity, unspecified: Secondary | ICD-10-CM | POA: Diagnosis not present

## 2017-11-13 DIAGNOSIS — R5383 Other fatigue: Secondary | ICD-10-CM | POA: Diagnosis not present

## 2017-11-13 DIAGNOSIS — E790 Hyperuricemia without signs of inflammatory arthritis and tophaceous disease: Secondary | ICD-10-CM | POA: Diagnosis not present

## 2017-11-13 DIAGNOSIS — L409 Psoriasis, unspecified: Secondary | ICD-10-CM | POA: Diagnosis not present

## 2017-11-13 DIAGNOSIS — L4050 Arthropathic psoriasis, unspecified: Secondary | ICD-10-CM | POA: Diagnosis not present

## 2017-11-13 DIAGNOSIS — M150 Primary generalized (osteo)arthritis: Secondary | ICD-10-CM | POA: Diagnosis not present

## 2017-11-13 DIAGNOSIS — M2550 Pain in unspecified joint: Secondary | ICD-10-CM | POA: Diagnosis not present

## 2017-12-02 ENCOUNTER — Encounter: Payer: Self-pay | Admitting: Nurse Practitioner

## 2017-12-03 NOTE — Progress Notes (Signed)
[facility]  PATIENT: Devin Combs DOB: 06-Apr-1951   REASON FOR VISIT: Follow-up of sleep disorder, initial ASV compliance HISTORY FROM: Patient and wife    HISTORY OF PRESENT ILLNESS:UPDATE 4/23/2019[name], 67 year old male returns for follow-up with complex sleep disorder here for initial ASV compliance.  He currently has a mask leaking but overall he feels better after being on ASV, less fatigue.  Compliance data dated 11/03/2017- 12/02/2017 shows compliance greater than 4 hours and 21 days or 70%.  Usage days 2830 or 93%.  Average usage 5 hours 42 minutes.  4-15.  AHI 1.  Leaks 95th percentile at 32.8 ESS score 6.  He returns for reevaluation  05/31/2017 [name] is a 67 year old married Caucasian right-handed male, presenting today upon request of my colleague Dr. Sarina Ill. His body mass index is currently 37, he was referred for a neuropathy workup and he has carried a long list of diagnoses including type 2 diabetes, degenerative lower back pain, anxiety, hypogonadism, myalgia, hypertension, diabetic peripheral neuropathy, autonomic neuropathy, GERD, major depression orthostatic hypotension. He also has suffered from obstructive sleep apnea in the past but is currently not a CPAP user. He lost weight intermittently which has reduced his snoring but by now his wife is observing snoring and apneas again. Dr. Cathren Laine concern is directed towards terminating an underlying condition that could contribute to atrial fibrillation, strokes, and poorly controlled hypertension.  Sleep habits are as follows: The patient usually watches TV prior to going to bed at about midnight, but on occasion it will be as late as 2 AM. The bedroom is cool, not quiet and dark. He insists on his TV running in the background, much to his wife's dismay. At this time he is able to from to go to sleep, he sleeps usually on his side with one pillow for head support, he also uses  a wedge. He will remain asleep for about 2 hours at a time but wakes up frequently in between and terms. He feels that back discomfort is the cause of this. He has nocturia up to 3 times at night. He reports a lot of dreams., is sleep talking.  He does haveurinary retention -it is difficult for him to initiate a steady stream. He wakes up spontaneously about 9:30 AM when he rises. He estimates over all his nocturnal sleep time to be 8 hours or more. He does not feel refreshed or restored his back hurts him in the morning as well as when he goes to bed.   REVIEW OF SYSTEMS: Full 14 system review of systems performed and notable only for those listed, all others are neg:  Constitutional: Fatigue Cardiovascular: neg Ear/Nose/Throat: neg  Skin: neg Eyes: Blurred vision Respiratory: neg Gastroitestinal: neg  Hematology/Lymphatic: neg  Endocrine: neg Musculoskeletal: Joint pain, neck pain back pain Allergy/Immunology: neg Neurological: neg Psychiatric: neg Sleep : Complex sleep disorder with ASV   ALLERGIES: No Known Allergies  HOME MEDICATIONS: Outpatient Medications Prior to Visit  Medication Sig Dispense Refill  . Alpha-D-Galactosidase (BEANO PO) Take 1 tablet by mouth as needed.    Marland Kitchen aluminum chloride (DRYSOL) 20 % external solution Apply 1 application topically every 7 (seven) days.     Marland Kitchen Apoaequorin (PREVAGEN) 10 MG CAPS Take 1 capsule by mouth daily.    . diazepam (VALIUM) 5 MG tablet Take 1 tablet (5 mg total) by mouth every 6 (six) hours as needed. AS NEEDED FOR MUSCLE SPASMS 80 tablet 1  . DULoxetine (CYMBALTA) 60  MG capsule Take 60 mg by mouth every evening.    . fentaNYL (DURAGESIC - DOSED MCG/HR) 75 MCG/HR Place 75 mcg onto the skin every 3 (three) days.     . hydrochlorothiazide (MICROZIDE) 12.5 MG capsule TAKE 1 CAPSULE (12.5 MG TOTAL) EVERY MORNING AS NEEDED FOR A BLOOD PRESSURE GREATER THAN 180. 30 capsule 6  . lisinopril (PRINIVIL,ZESTRIL) 40 MG tablet TAKE 1 TAB BY  MOUTH EVERY EVENING. PATIENT NEEDS TO CALL AND SCHEDULE AN APPT FOR FURTHER REFILLS 15 tablet 0  . metFORMIN (GLUCOPHAGE) 1000 MG tablet Take 1,000 mg by mouth 2 (two) times daily with a meal.    . methocarbamol (ROBAXIN) 500 MG tablet Take 1 tablet (500 mg total) by mouth 4 (four) times daily. (Patient taking differently: Take 500 mg by mouth every 6 (six) hours as needed for muscle spasms. ) 30 tablet 1  . omeprazole (PRILOSEC) 20 MG capsule Take 20 mg by mouth every morning.  5  . oxyCODONE (OXY IR/ROXICODONE) 5 MG immediate release tablet Take 1-4 tablets (5-20 mg total) by mouth every 3 (three) hours as needed for moderate pain or severe pain. 90 tablet 0  . oxymetazoline (AFRIN) 0.05 % nasal spray Place 1 spray into both nostrils 2 (two) times daily as needed for congestion.    . rosuvastatin (CRESTOR) 20 MG tablet Take 20 mg by mouth. Takes once weekly.    . valACYclovir (VALTREX) 1000 MG tablet Take 1,000 mg by mouth daily as needed (outbreaks).      No facility-administered medications prior to visit.     PAST MEDICAL HISTORY: Past Medical History:  Diagnosis Date  . Anxiety   . Arthritis   . Cancer (HCC)    basal and squamous cell carcinoma  . Chronic back pain   . DDD (degenerative disc disease), thoracolumbar   . Degenerative cervical spinal stenosis   . Depression   . Diabetic autonomic neuropathy ([place])   . Dyspnea    with exertion   . GERD (gastroesophageal reflux disease)   . Headache   . History of bronchitis   . History of kidney stones   . Hyperlipidemia   . Hypertension   . Hypogonadism in male   . Myocardial infarction ([place])   . Neuropathy   . Orthostatic hypotension   . OSA (obstructive sleep apnea)    per pt study done 2006 (approx)  used cpap few yrs then stopped using, stated didn't feel he needed it anymore  . Psoriasis   . Retinal tear   . Right knee meniscal tear   . Type 2 diabetes mellitus ([place])   . Urinary hesitancy   . Vitamin D deficiency      PAST SURGICAL HISTORY: Past Surgical History:  Procedure Laterality Date  . CATARACT EXTRACTION W/ INTRAOCULAR LENS  IMPLANT, BILATERAL  right 2015//  left 2012  . CORRECTION REPAIR MULTIPLE  TOES , LEFT FOOT  YRS AGO  . EXTRACORPOREAL SHOCK WAVE LITHOTRIPSY  2000 approx  . KNEE ARTHROSCOPY Left 2007  . KNEE ARTHROSCOPY Right 02/10/2015   Procedure: ARTHROSCOPY RIGHT KNEE WITH DEBRIDEMENT AND CHRONDROPLASTY;  Surgeon: Gaynelle Arabian, MD;  Location: [facility];  Service: Orthopedics;  Laterality: Right;  . PARTIAL KNEE ARTHROPLASTY Right 07/19/2016   Procedure: RIGHT KNEE MEDICAL UNICOMPARTMENTAL ARTHROPLASTY;  Surgeon: Gaynelle Arabian, MD;  Location: [facility];  Service: Orthopedics;  Laterality: Right;  . RADICAL ORCHIECTOMY Left 1996   benign  . REMOVAL SPURS RIGHT GREAT TOE  YRS AGO  .  TOTAL KNEE ARTHROPLASTY Left 09-16-2007    FAMILY HISTORY: Family History  Problem Relation Age of Onset  . Neuropathy Neg Hx     SOCIAL HISTORY: Social History   Socioeconomic History  . Marital status: Married    Spouse name: Not on file  . Number of children: 1  . Years of education: Not on file  . Highest education level: Some college, no degree  Occupational History  . Not on file  Social Needs  . Financial resource strain: Not on file  . Food insecurity:    Worry: Not on file    Inability: Not on file  . Transportation needs:    Medical: Not on file    Non-medical: Not on file  Tobacco Use  . Smoking status: Never Smoker  . Smokeless tobacco: Never Used  Substance and Sexual Activity  . Alcohol use: Yes    Comment: rare  . Drug use: No  . Sexual activity: Not on file  Lifestyle  . Physical activity:    Days per week: Not on file    Minutes per session: Not on file  . Stress: Not on file  Relationships  . Social connections:    Talks on phone: Not on file    Gets together: Not on file    Attends religious service: Not on file    Active member of club or  organization: Not on file    Attends meetings of clubs or organizations: Not on file    Relationship status: Not on file  . Intimate partner violence:    Fear of current or ex partner: Not on file    Emotionally abused: Not on file    Physically abused: Not on file    Forced sexual activity: Not on file  Other Topics Concern  . Not on file  Social History Narrative   Lives at home with wife, who is Therapist, sports.  Pt is retired.  Caffeine 2-3 cups per week. Right handed.      PHYSICAL EXAM  Vitals:   12/04/17 1310  BP: (!) 146/82  Pulse: 95  Weight: 262 lb 12.8 oz (119.2 kg)  Height: 6' (1.829 m)   Body mass index is 35.64 kg/m.  Generalized: Well developed, obese male in no acute distress  Head: normocephalic and atraumatic,. Oropharynx benign  Neck: Supple, circumference 20 Musculoskeletal: No deformity   Neurological examination   Mentation: Alert oriented to time, place, history taking. Attention span and concentration appropriate. Recent and remote memory intact.  Follows all commands speech and language fluent.   Cranial nerve II-XII: Pupils were equal round reactive to light extraocular movements were full, visual field were full on confrontational test. Facial sensation and strength were normal. hearing was intact to finger rubbing bilaterally. Uvula tongue midline. head turning and shoulder shrug were normal and symmetric.Tongue protrusion into cheek strength was normal. Motor: normal bulk and tone, full strength in the BUE, BLE, fine finger movements normal, no pronator drift. No focal weakness Sensory: normal and symmetric to light touch,   Coordination: finger-nose-finger, heel-to-shin bilaterally, no dysmetria Gait and Station: Rising up from seated position without assistance, normal stance,  moderate stride, good arm swing, smooth turning, able to perform tiptoe, and heel walking without difficulty. Tandem gait is steady  DIAGNOSTIC DATA (LABS, IMAGING, TESTING) - I  reviewed patient records, labs, notes, testing and imaging myself where available.  Lab Results  Component Value Date   WBC 13.9 (H) 07/20/2016   HGB 13.4 07/20/2016  HCT 41.3 07/20/2016   MCV 88.8 07/20/2016   PLT 280 07/20/2016      Component Value Date/Time   NA 143 05/02/2017 1144   K 4.6 05/02/2017 1144   CL 99 05/02/2017 1144   CO2 24 05/02/2017 1144   GLUCOSE 181 (H) 05/02/2017 1144   GLUCOSE 227 (H) 07/20/2016 0405   BUN 17 05/02/2017 1144   CREATININE 1.22 05/02/2017 1144   CALCIUM 10.6 (H) 05/02/2017 1144   PROT 7.3 05/02/2017 1144   ALBUMIN 4.0 07/13/2016 1434   AST 25 07/13/2016 1434   ALT 35 07/13/2016 1434   ALKPHOS 94 07/13/2016 1434   BILITOT 0.7 07/13/2016 1434   GFRNONAA 62 05/02/2017 1144   GFRAA 71 05/02/2017 1144    Lab Results  Component Value Date   HGBA1C 6.5 (H) 07/13/2016    ASSESSMENT AND PLAN  67 y.o. year old male  has a past medical history of Anxiety, Arthritis, Cancer ([place]), Chronic back pain, DDD (degenerative disc disease), thoracolumbar, Degenerative cervical spinal stenosis, Depression, Diabetic autonomic neuropathy ([place]), Dyspnea, GERD (gastroesophageal reflux disease), Headache, History of bronchitis, History of kidney stones, Hyperlipidemia, Hypertension, Hypogonadism in male, Myocardial infarction ([place]), Neuropathy, Orthostatic hypotension, OSA (obstructive sleep apnea), Psoriasis, Retinal tear, Right knee meniscal tear, Type 2 diabetes mellitus ([place]), Urinary hesitancy, and Vitamin D deficiency. here  To follow up for complex sleep disorder and ASV compliance. Data dated 11/03/2017- 12/02/2017 shows compliance greater than 4 hours and 21 days or 70%.  Usage days 2830 or 93%.  Average usage 5 hours 42 minutes.  4-15.  AHI 1.  Leaks 95th percentile at 32.8 ESS score 6.  ASV compliance 70% reviewed data with patient Work with equipment company to get correct mask without leaks F/U in 4 months Dennie Bible, [facility], [facility],  [facility] [address] [place], [place] [address] [phone]

## 2017-12-04 ENCOUNTER — Encounter: Payer: Self-pay | Admitting: Nurse Practitioner

## 2017-12-04 ENCOUNTER — Ambulatory Visit (INDEPENDENT_AMBULATORY_CARE_PROVIDER_SITE_OTHER): Payer: Medicare Other | Admitting: Nurse Practitioner

## 2017-12-04 VITALS — BP 146/82 | HR 95 | Ht 72.0 in | Wt 262.8 lb

## 2017-12-04 DIAGNOSIS — G4731 Primary central sleep apnea: Secondary | ICD-10-CM | POA: Diagnosis not present

## 2017-12-04 NOTE — Patient Instructions (Addendum)
ASV compliance 70%  Work with equipment company to get correct mask without leaks F/U in 4 months

## 2018-01-11 DIAGNOSIS — G909 Disorder of the autonomic nervous system, unspecified: Secondary | ICD-10-CM | POA: Diagnosis not present

## 2018-01-11 DIAGNOSIS — M545 Low back pain, unspecified: Secondary | ICD-10-CM | POA: Insufficient documentation

## 2018-01-22 DIAGNOSIS — M48062 Spinal stenosis, lumbar region with neurogenic claudication: Secondary | ICD-10-CM | POA: Diagnosis not present

## 2018-01-22 DIAGNOSIS — M4716 Other spondylosis with myelopathy, lumbar region: Secondary | ICD-10-CM | POA: Diagnosis not present

## 2018-01-24 DIAGNOSIS — M5136 Other intervertebral disc degeneration, lumbar region: Secondary | ICD-10-CM | POA: Insufficient documentation

## 2018-03-19 DIAGNOSIS — G4733 Obstructive sleep apnea (adult) (pediatric): Secondary | ICD-10-CM | POA: Diagnosis not present

## 2018-03-19 DIAGNOSIS — E1142 Type 2 diabetes mellitus with diabetic polyneuropathy: Secondary | ICD-10-CM | POA: Diagnosis not present

## 2018-03-19 DIAGNOSIS — E7800 Pure hypercholesterolemia, unspecified: Secondary | ICD-10-CM | POA: Diagnosis not present

## 2018-03-19 DIAGNOSIS — E291 Testicular hypofunction: Secondary | ICD-10-CM | POA: Diagnosis not present

## 2018-03-19 DIAGNOSIS — F324 Major depressive disorder, single episode, in partial remission: Secondary | ICD-10-CM | POA: Diagnosis not present

## 2018-03-19 DIAGNOSIS — I951 Orthostatic hypotension: Secondary | ICD-10-CM | POA: Diagnosis not present

## 2018-03-19 DIAGNOSIS — K219 Gastro-esophageal reflux disease without esophagitis: Secondary | ICD-10-CM | POA: Diagnosis not present

## 2018-03-19 DIAGNOSIS — M109 Gout, unspecified: Secondary | ICD-10-CM | POA: Diagnosis not present

## 2018-03-19 DIAGNOSIS — K580 Irritable bowel syndrome with diarrhea: Secondary | ICD-10-CM | POA: Diagnosis not present

## 2018-03-19 DIAGNOSIS — I10 Essential (primary) hypertension: Secondary | ICD-10-CM | POA: Diagnosis not present

## 2018-03-19 DIAGNOSIS — E1141 Type 2 diabetes mellitus with diabetic mononeuropathy: Secondary | ICD-10-CM | POA: Diagnosis not present

## 2018-03-19 DIAGNOSIS — Z1159 Encounter for screening for other viral diseases: Secondary | ICD-10-CM | POA: Diagnosis not present

## 2018-03-19 DIAGNOSIS — Z125 Encounter for screening for malignant neoplasm of prostate: Secondary | ICD-10-CM | POA: Diagnosis not present

## 2018-03-21 DIAGNOSIS — M5136 Other intervertebral disc degeneration, lumbar region: Secondary | ICD-10-CM | POA: Diagnosis not present

## 2018-03-22 DIAGNOSIS — M545 Low back pain: Secondary | ICD-10-CM | POA: Diagnosis not present

## 2018-03-22 DIAGNOSIS — M5136 Other intervertebral disc degeneration, lumbar region: Secondary | ICD-10-CM | POA: Diagnosis not present

## 2018-04-10 DIAGNOSIS — I10 Essential (primary) hypertension: Secondary | ICD-10-CM | POA: Diagnosis not present

## 2018-04-11 DIAGNOSIS — M5136 Other intervertebral disc degeneration, lumbar region: Secondary | ICD-10-CM | POA: Diagnosis not present

## 2018-04-12 NOTE — Progress Notes (Signed)
[facility]  PATIENT: Devin Combs DOB: 1951-04-23   REASON FOR VISIT: Follow-up of sleep disorder,  ASV compliance HISTORY FROM: Patient and wife    HISTORY OF PRESENT ILLNESS:UPDATE 9/3/2019[name], 67 year old male returns for follow-up with a history of obstructive sleep apnea here for ASV compliance.  He continues to have a significant mask leak and wife says he is a mouth breather.  Compliance data dated 03/17/2018-10/2017 shows compliance greater than 4 hours at 47% for 14 days.  Average usage 3 hours 43 minutes.  Set pressure 4 to 15 cm of water.  Leak 95th percentile 36.9.  AHI 3.5.    UPDATE 4/23/2019[name], 67 year old male returns for follow-up with complex sleep disorder here for initial ASV compliance.  He currently has a mask leaking but overall he feels better after being on ASV, less fatigue.  Compliance data dated 11/03/2017- 12/02/2017 shows compliance greater than 4 hours and 21 days or 70%.  Usage days 2830 or 93%.  Average usage 5 hours 42 minutes.  4-15.  AHI 1.  Leaks 95th percentile at 32.8 ESS score 6.  He returns for reevaluation.  He also has a diagnosis of dysautonomia and wants to be referred to [facility] to the POTS and dysautonomia clinic.    05/31/2017 [name] is a 67 year old married Caucasian right-handed male, presenting today upon request of my colleague Dr. Sarina Combs. His body mass index is currently 55, he was referred for a neuropathy workup and he has carried a long list of diagnoses including type 2 diabetes, degenerative lower back pain, anxiety, hypogonadism, myalgia, hypertension, diabetic peripheral neuropathy, autonomic neuropathy, GERD, major depression orthostatic hypotension. He also has suffered from obstructive sleep apnea in the past but is currently not a CPAP user. He lost weight intermittently which has reduced his snoring but by now his wife is observing snoring and apneas again. Dr. Cathren Combs  concern is directed towards terminating an underlying condition that could contribute to atrial fibrillation, strokes, and poorly controlled hypertension.  Sleep habits are as follows: The patient usually watches TV prior to going to bed at about midnight, but on occasion it will be as late as 2 AM. The bedroom is cool, not quiet and dark. He insists on his TV running in the background, much to his wife's dismay. At this time he is able to from to go to sleep, he sleeps usually on his side with one pillow for head support, he also uses a wedge. He will remain asleep for about 2 hours at a time but wakes up frequently in between and terms. He feels that back discomfort is the cause of this. He has nocturia up to 3 times at night. He reports a lot of dreams., is sleep talking.  He does haveurinary retention -it is difficult for him to initiate a steady stream. He wakes up spontaneously about 9:30 AM when he rises. He estimates over all his nocturnal sleep time to be 8 hours or more. He does not feel refreshed or restored his back hurts him in the morning as well as when he goes to bed.   REVIEW OF SYSTEMS: Full 14 system review of systems performed and notable only for those listed, all others are neg:  Constitutional: Fatigue Cardiovascular: neg Ear/Nose/Throat: neg  Skin: neg Eyes: Blurred vision Respiratory: neg Gastroitestinal: neg  Hematology/Lymphatic: neg  Endocrine: neg Musculoskeletal: Joint pain, neck pain back pain Allergy/Immunology: neg Neurological: neg Psychiatric: neg Sleep : Complex sleep disorder with  ASV   ALLERGIES: No Known Allergies  HOME MEDICATIONS: Outpatient Medications Prior to Visit  Medication Sig Dispense Refill  . Alpha-D-Galactosidase (BEANO PO) Take 1 tablet by mouth as needed.    Marland Kitchen aluminum chloride (DRYSOL) 20 % external solution Apply 1 application topically every 7 (seven) days.     Marland Kitchen Apoaequorin (PREVAGEN) 10 MG CAPS Take 1 capsule by mouth daily.     . diazepam (VALIUM) 5 MG tablet Take 1 tablet (5 mg total) by mouth every 6 (six) hours as needed. AS NEEDED FOR MUSCLE SPASMS 80 tablet 1  . DULoxetine (CYMBALTA) 60 MG capsule Take 60 mg by mouth every evening.    . fentaNYL (DURAGESIC - DOSED MCG/HR) 75 MCG/HR Place 75 mcg onto the skin every 3 (three) days.     . hydrochlorothiazide (MICROZIDE) 12.5 MG capsule TAKE 1 CAPSULE (12.5 MG TOTAL) EVERY MORNING AS NEEDED FOR A BLOOD PRESSURE GREATER THAN 180. 30 capsule 6  . lisinopril (PRINIVIL,ZESTRIL) 40 MG tablet TAKE 1 TAB BY MOUTH EVERY EVENING. PATIENT NEEDS TO CALL AND SCHEDULE AN APPT FOR FURTHER REFILLS 15 tablet 0  . metFORMIN (GLUCOPHAGE) 1000 MG tablet Take 1,000 mg by mouth 2 (two) times daily with a meal.    . methocarbamol (ROBAXIN) 500 MG tablet Take 1 tablet (500 mg total) by mouth 4 (four) times daily. (Patient taking differently: Take 500 mg by mouth every 6 (six) hours as needed for muscle spasms. ) 30 tablet 1  . omeprazole (PRILOSEC) 20 MG capsule Take 20 mg by mouth every morning.  5  . oxymetazoline (AFRIN) 0.05 % nasal spray Place 1 spray into both nostrils 2 (two) times daily as needed for congestion.    . rosuvastatin (CRESTOR) 20 MG tablet Take 20 mg by mouth. Takes once weekly.    . valACYclovir (VALTREX) 1000 MG tablet Take 1,000 mg by mouth daily as needed (outbreaks).     Marland Kitchen oxyCODONE (OXY IR/ROXICODONE) 5 MG immediate release tablet Take 1-4 tablets (5-20 mg total) by mouth every 3 (three) hours as needed for moderate pain or severe pain. 90 tablet 0   No facility-administered medications prior to visit.     PAST MEDICAL HISTORY: Past Medical History:  Diagnosis Date  . Anxiety   . Arthritis   . Cancer (HCC)    basal and squamous cell carcinoma  . Chronic back pain   . DDD (degenerative disc disease), thoracolumbar   . Degenerative cervical spinal stenosis   . Depression   . Diabetic autonomic neuropathy ([place])   . Dyspnea    with exertion   . GERD  (gastroesophageal reflux disease)   . Headache   . History of bronchitis   . History of kidney stones   . Hyperlipidemia   . Hypertension   . Hypogonadism in male   . Myocardial infarction ([place])   . Neuropathy   . Orthostatic hypotension   . OSA (obstructive sleep apnea)    per pt study done 2006 (approx)  used cpap few yrs then stopped using, stated didn't feel he needed it anymore  . Psoriasis   . Retinal tear   . Right knee meniscal tear   . Type 2 diabetes mellitus ([place])   . Urinary hesitancy   . Vitamin D deficiency     PAST SURGICAL HISTORY: Past Surgical History:  Procedure Laterality Date  . CATARACT EXTRACTION W/ INTRAOCULAR LENS  IMPLANT, BILATERAL  right 2015//  left 2012  . CORRECTION REPAIR MULTIPLE  TOES ,  LEFT FOOT  YRS AGO  . EXTRACORPOREAL SHOCK WAVE LITHOTRIPSY  2000 approx  . KNEE ARTHROSCOPY Left 2007  . KNEE ARTHROSCOPY Right 02/10/2015   Procedure: ARTHROSCOPY RIGHT KNEE WITH DEBRIDEMENT AND CHRONDROPLASTY;  Surgeon: Gaynelle Arabian, MD;  Location: [facility];  Service: Orthopedics;  Laterality: Right;  . PARTIAL KNEE ARTHROPLASTY Right 07/19/2016   Procedure: RIGHT KNEE MEDICAL UNICOMPARTMENTAL ARTHROPLASTY;  Surgeon: Gaynelle Arabian, MD;  Location: [facility];  Service: Orthopedics;  Laterality: Right;  . RADICAL ORCHIECTOMY Left 1996   benign  . REMOVAL SPURS RIGHT GREAT TOE  YRS AGO  . TOTAL KNEE ARTHROPLASTY Left 09-16-2007    FAMILY HISTORY: Family History  Problem Relation Age of Onset  . Neuropathy Neg Hx     SOCIAL HISTORY: Social History   Socioeconomic History  . Marital status: Married    Spouse name: Not on file  . Number of children: 1  . Years of education: Not on file  . Highest education level: Some college, no degree  Occupational History  . Not on file  Social Needs  . Financial resource strain: Not on file  . Food insecurity:    Worry: Not on file    Inability: Not on file  . Transportation needs:    Medical:  Not on file    Non-medical: Not on file  Tobacco Use  . Smoking status: Never Smoker  . Smokeless tobacco: Never Used  Substance and Sexual Activity  . Alcohol use: Yes    Comment: rare  . Drug use: No  . Sexual activity: Not on file  Lifestyle  . Physical activity:    Days per week: Not on file    Minutes per session: Not on file  . Stress: Not on file  Relationships  . Social connections:    Talks on phone: Not on file    Gets together: Not on file    Attends religious service: Not on file    Active member of club or organization: Not on file    Attends meetings of clubs or organizations: Not on file    Relationship status: Not on file  . Intimate partner violence:    Fear of current or ex partner: Not on file    Emotionally abused: Not on file    Physically abused: Not on file    Forced sexual activity: Not on file  Other Topics Concern  . Not on file  Social History Narrative   Lives at home with wife, who is Therapist, sports.  Pt is retired.  Caffeine 2-3 cups per week. Right handed.      PHYSICAL EXAM  Vitals:   04/16/18 1451  BP: (!) 158/70  Pulse: (!) 111  SpO2: 97%  Weight: 264 lb 9.6 oz (120 kg)  Height: 6' (1.829 m)   Body mass index is 35.89 kg/m.  Generalized: Well developed, obese male in no acute distress  Head: normocephalic and atraumatic,. Oropharynx benign mallopatti4 Neck: Supple, circumference 20 Musculoskeletal: No deformity   Neurological examination   Mentation: Alert oriented to time, place, history taking. Attention span and concentration appropriate. Recent and remote memory intact.  Follows all commands speech and language fluent.   Cranial nerve II-XII: Pupils were equal round reactive to light extraocular movements were full, visual field were full on confrontational test. Facial sensation and strength were normal. hearing was intact to finger rubbing bilaterally. Uvula tongue midline. head turning and shoulder shrug were normal and  symmetric.Tongue protrusion into cheek strength was  normal. Motor: normal bulk and tone, full strength in the BUE, BLE, fine finger movements normal, no pronator drift. No focal weakness Sensory: normal and symmetric to light touch,   Coordination: finger-nose-finger, heel-to-shin bilaterally, no dysmetria Gait and Station: Rising up from seated position without assistance, normal stance,  moderate stride, good arm swing, smooth turning, able to perform tiptoe, and heel walking without difficulty. Tandem gait is steady  DIAGNOSTIC DATA (LABS, IMAGING, TESTING) - I reviewed patient records, labs, notes, testing and imaging myself where available.  Lab Results  Component Value Date   WBC 13.9 (H) 07/20/2016   HGB 13.4 07/20/2016   HCT 41.3 07/20/2016   MCV 88.8 07/20/2016   PLT 280 07/20/2016      Component Value Date/Time   NA 143 05/02/2017 1144   K 4.6 05/02/2017 1144   CL 99 05/02/2017 1144   CO2 24 05/02/2017 1144   GLUCOSE 181 (H) 05/02/2017 1144   GLUCOSE 227 (H) 07/20/2016 0405   BUN 17 05/02/2017 1144   CREATININE 1.22 05/02/2017 1144   CALCIUM 10.6 (H) 05/02/2017 1144   PROT 7.3 05/02/2017 1144   ALBUMIN 4.0 07/13/2016 1434   AST 25 07/13/2016 1434   ALT 35 07/13/2016 1434   ALKPHOS 94 07/13/2016 1434   BILITOT 0.7 07/13/2016 1434   GFRNONAA 62 05/02/2017 1144   GFRAA 71 05/02/2017 1144    Lab Results  Component Value Date   HGBA1C 6.5 (H) 07/13/2016    ASSESSMENT AND PLAN  67 y.o. year old male  has a past medical history of Anxiety, Arthritis, Cancer ([place]), Chronic back pain, DDD (degenerative disc disease), thoracolumbar, Degenerative cervical spinal stenosis, Depression, Diabetic autonomic neuropathy ([place]), Dyspnea, GERD (gastroesophageal reflux disease), Headache, History of bronchitis, History of kidney stones, Hyperlipidemia, Hypertension, Hypogonadism in male, Myocardial infarction ([place]), Neuropathy, Orthostatic hypotension, OSA (obstructive sleep apnea),  Psoriasis, Retinal tear, Right knee meniscal tear, Type 2 diabetes mellitus ([place]), Urinary hesitancy, and Vitamin D deficiency. here  To follow up for complex sleep disorder and ASV compliance. Data dated 03/17/2018-10/2017 shows compliance greater than 4 hours at 47% for 14 days.  Average usage 3 hours 43 minutes.  Set pressure 4 to 15 cm of water.  Leak 95th percentile 36.9.  AHI 3.5.ESS 5  ASV compliance47% Patient continues to have significant leak , work with equipment company to get correct mask without leaks Continue same settings F/U in 4 months Will send referral to [place] Dr. Jerelene Redden per pt request Devin Combs, [facility], [facility], [facility] [address] [place], [place] [address] [phone]

## 2018-04-15 ENCOUNTER — Encounter: Payer: Self-pay | Admitting: Nurse Practitioner

## 2018-04-16 ENCOUNTER — Ambulatory Visit (INDEPENDENT_AMBULATORY_CARE_PROVIDER_SITE_OTHER): Payer: Medicare Other | Admitting: Nurse Practitioner

## 2018-04-16 ENCOUNTER — Telehealth: Payer: Self-pay | Admitting: *Deleted

## 2018-04-16 VITALS — BP 158/70 | HR 111 | Ht 72.0 in | Wt 264.6 lb

## 2018-04-16 DIAGNOSIS — I951 Orthostatic hypotension: Secondary | ICD-10-CM

## 2018-04-16 DIAGNOSIS — G903 Multi-system degeneration of the autonomic nervous system: Secondary | ICD-10-CM | POA: Diagnosis not present

## 2018-04-16 DIAGNOSIS — G4731 Primary central sleep apnea: Secondary | ICD-10-CM | POA: Diagnosis not present

## 2018-04-16 NOTE — Patient Instructions (Signed)
ASV compliance47% Patient continues to have significant leak , work with equipment company to get correct mask without leaks Continue same settings F/U in 4 months Will send referral to [place] Dr. Jerelene Redden

## 2018-04-16 NOTE — Telephone Encounter (Signed)
Faxed order for CPAP supplies to [facility] at [phone]. Received fax confirmation.

## 2018-04-26 DIAGNOSIS — M5136 Other intervertebral disc degeneration, lumbar region: Secondary | ICD-10-CM | POA: Diagnosis not present

## 2018-04-26 DIAGNOSIS — G8929 Other chronic pain: Secondary | ICD-10-CM | POA: Insufficient documentation

## 2018-04-26 DIAGNOSIS — E119 Type 2 diabetes mellitus without complications: Secondary | ICD-10-CM | POA: Insufficient documentation

## 2018-04-26 DIAGNOSIS — M545 Low back pain: Secondary | ICD-10-CM | POA: Diagnosis not present

## 2018-04-30 DIAGNOSIS — M546 Pain in thoracic spine: Secondary | ICD-10-CM | POA: Diagnosis not present

## 2018-04-30 DIAGNOSIS — M6281 Muscle weakness (generalized): Secondary | ICD-10-CM | POA: Diagnosis not present

## 2018-04-30 DIAGNOSIS — M545 Low back pain: Secondary | ICD-10-CM | POA: Diagnosis not present

## 2018-05-01 DIAGNOSIS — M545 Low back pain: Secondary | ICD-10-CM | POA: Diagnosis not present

## 2018-05-01 DIAGNOSIS — M546 Pain in thoracic spine: Secondary | ICD-10-CM | POA: Diagnosis not present

## 2018-05-01 DIAGNOSIS — M6281 Muscle weakness (generalized): Secondary | ICD-10-CM | POA: Diagnosis not present

## 2018-05-06 DIAGNOSIS — Z79891 Long term (current) use of opiate analgesic: Secondary | ICD-10-CM | POA: Insufficient documentation

## 2018-05-07 DIAGNOSIS — M546 Pain in thoracic spine: Secondary | ICD-10-CM | POA: Diagnosis not present

## 2018-05-07 DIAGNOSIS — M545 Low back pain: Secondary | ICD-10-CM | POA: Diagnosis not present

## 2018-05-07 DIAGNOSIS — M6281 Muscle weakness (generalized): Secondary | ICD-10-CM | POA: Diagnosis not present

## 2018-05-09 DIAGNOSIS — M6281 Muscle weakness (generalized): Secondary | ICD-10-CM | POA: Diagnosis not present

## 2018-05-09 DIAGNOSIS — M545 Low back pain: Secondary | ICD-10-CM | POA: Diagnosis not present

## 2018-05-09 DIAGNOSIS — M546 Pain in thoracic spine: Secondary | ICD-10-CM | POA: Diagnosis not present

## 2018-05-14 DIAGNOSIS — M546 Pain in thoracic spine: Secondary | ICD-10-CM | POA: Diagnosis not present

## 2018-05-14 DIAGNOSIS — M545 Low back pain: Secondary | ICD-10-CM | POA: Diagnosis not present

## 2018-05-14 DIAGNOSIS — M6281 Muscle weakness (generalized): Secondary | ICD-10-CM | POA: Diagnosis not present

## 2018-05-16 DIAGNOSIS — M6281 Muscle weakness (generalized): Secondary | ICD-10-CM | POA: Diagnosis not present

## 2018-05-16 DIAGNOSIS — M545 Low back pain: Secondary | ICD-10-CM | POA: Diagnosis not present

## 2018-05-16 DIAGNOSIS — M546 Pain in thoracic spine: Secondary | ICD-10-CM | POA: Diagnosis not present

## 2018-05-20 DIAGNOSIS — M6281 Muscle weakness (generalized): Secondary | ICD-10-CM | POA: Diagnosis not present

## 2018-05-20 DIAGNOSIS — M545 Low back pain: Secondary | ICD-10-CM | POA: Diagnosis not present

## 2018-05-20 DIAGNOSIS — M546 Pain in thoracic spine: Secondary | ICD-10-CM | POA: Diagnosis not present

## 2018-05-23 ENCOUNTER — Ambulatory Visit (INDEPENDENT_AMBULATORY_CARE_PROVIDER_SITE_OTHER): Payer: Medicare Other | Admitting: Internal Medicine

## 2018-05-23 ENCOUNTER — Encounter: Payer: Self-pay | Admitting: Internal Medicine

## 2018-05-23 VITALS — BP 149/82 | HR 81 | Ht 72.0 in | Wt 267.6 lb

## 2018-05-23 DIAGNOSIS — G901 Familial dysautonomia [Riley-Day]: Secondary | ICD-10-CM

## 2018-05-23 DIAGNOSIS — I10 Essential (primary) hypertension: Secondary | ICD-10-CM

## 2018-05-23 NOTE — Patient Instructions (Signed)
Medication Instructions:  Your physician recommends that you continue on your current medications as directed. Please refer to the Current Medication list given to you today.  If you need a refill on your cardiac medications before your next appointment, please call your pharmacy.   Lab work: None ordered.  If you have labs (blood work) drawn today and your tests are completely normal, you will receive your results only by: Marland Kitchen MyChart Message (if you have MyChart) OR . A paper copy in the mail If you have any lab test that is abnormal or we need to change your treatment, we will call you to review the results.  Testing/Procedures: None ordered.  Follow-Up: At [facility], you and your health needs are our priority.  As part of our continuing mission to provide you with exceptional heart care, we have created designated Provider Care Teams.  These Care Teams include your primary Cardiologist (physician) and Advanced Practice Providers (APPs -  Physician Assistants and Nurse Practitioners) who all work together to provide you with the care you need, when you need it. You will need a follow up appointment in 12 months.  Please call our office 2 months in advance to schedule this appointment.  You may see Dr Caryl Comes or one of the following Advanced Practice Providers on your designated Care Team:   Chanetta Marshall, NP . Tommye Standard, PA-C    Any Other Special Instructions Will Be Listed Below (If Applicable).

## 2018-05-23 NOTE — Progress Notes (Signed)
Patient Care Team: Maury Dus, MD as PCP - General (Family Medicine)   HPI  Devin Combs is a 67 y.o. male Seen in follow-up for symptomatic orthostatic hypotension occurring in the context of diabetes with autonomic neuropathy.  When seen 6/17 we reviewed the physiology of autonomic dysfunction and his symptoms have largely abated.   Still with orthostatic dizziness without syncope, noted particularly when getting out of the car.  Denies exertional chest pain  But major limitation to exercise is back pain.  No edema.  His wife is also quite concerned about his memory.  This is been considerably worse over the last 6-12 months although the last couple of months have been somewhat better.  This latter change certainly related to more effective therapy for significant central and obstructive sleep apnea.  Abnormally ECG prompted Myoview  7/17  >> no ischemia and normal LV function   7/17  >> Echo  LA enlargement; LV function normal     Past Medical History:  Diagnosis Date  . Anxiety   . Arthritis   . Cancer (HCC)    basal and squamous cell carcinoma  . Chronic back pain   . DDD (degenerative disc disease), thoracolumbar   . Degenerative cervical spinal stenosis   . Depression   . Diabetic autonomic neuropathy ([place])   . Dyspnea    with exertion   . GERD (gastroesophageal reflux disease)   . Headache   . History of bronchitis   . History of kidney stones   . Hyperlipidemia   . Hypertension   . Hypogonadism in male   . Myocardial infarction ([place])   . Neuropathy   . Orthostatic hypotension   . OSA (obstructive sleep apnea)    per pt study done 2006 (approx)  used cpap few yrs then stopped using, stated didn't feel he needed it anymore  . Psoriasis   . Retinal tear   . Right knee meniscal tear   . Type 2 diabetes mellitus ([place])   . Urinary hesitancy   . Vitamin D deficiency     Past Surgical History:  Procedure Laterality Date  . CATARACT  EXTRACTION W/ INTRAOCULAR LENS  IMPLANT, BILATERAL  right 2015//  left 2012  . CORRECTION REPAIR MULTIPLE  TOES , LEFT FOOT  YRS AGO  . EXTRACORPOREAL SHOCK WAVE LITHOTRIPSY  2000 approx  . KNEE ARTHROSCOPY Left 2007  . KNEE ARTHROSCOPY Right 02/10/2015   Procedure: ARTHROSCOPY RIGHT KNEE WITH DEBRIDEMENT AND CHRONDROPLASTY;  Surgeon: Gaynelle Arabian, MD;  Location: [facility];  Service: Orthopedics;  Laterality: Right;  . PARTIAL KNEE ARTHROPLASTY Right 07/19/2016   Procedure: RIGHT KNEE MEDICAL UNICOMPARTMENTAL ARTHROPLASTY;  Surgeon: Gaynelle Arabian, MD;  Location[facility];  Service: Orthopedics;  Laterality: Right;  . RADICAL ORCHIECTOMY Left 1996   benign  . REMOVAL SPURS RIGHT GREAT TOE  YRS AGO  . TOTAL KNEE ARTHROPLASTY Left 09-16-2007    Current Outpatient Medications  Medication Sig Dispense Refill  . Alpha-D-Galactosidase (BEANO PO) Take 1 tablet by mouth as needed.    Marland Kitchen aluminum chloride (DRYSOL) 20 % external solution Apply 1 application topically every 7 (seven) days.     Marland Kitchen Apoaequorin (PREVAGEN) 10 MG CAPS Take 1 capsule by mouth daily.    . diazepam (VALIUM) 5 MG tablet Take 1 tablet (5 mg total) by mouth every 6 (six) hours as needed. AS NEEDED FOR MUSCLE SPASMS 80 tablet 1  . DULoxetine (CYMBALTA) 60 MG capsule Take 60 mg  by mouth every evening.    . fentaNYL (DURAGESIC - DOSED MCG/HR) 75 MCG/HR Place 75 mcg onto the skin every 3 (three) days.     . hydrochlorothiazide (MICROZIDE) 12.5 MG capsule TAKE 1 CAPSULE (12.5 MG TOTAL) EVERY MORNING AS NEEDED FOR A BLOOD PRESSURE GREATER THAN 180. 30 capsule 6  . lisinopril (PRINIVIL,ZESTRIL) 40 MG tablet TAKE 1 TAB BY MOUTH EVERY EVENING. PATIENT NEEDS TO CALL AND SCHEDULE AN APPT FOR FURTHER REFILLS 15 tablet 0  . metFORMIN (GLUCOPHAGE) 1000 MG tablet Take 1,000 mg by mouth 2 (two) times daily with a meal.    . methocarbamol (ROBAXIN) 500 MG tablet Take 1 tablet (500 mg total) by mouth 4 (four) times daily. 30 tablet 1    . omeprazole (PRILOSEC) 20 MG capsule Take 20 mg by mouth every morning.  5  . oxymetazoline (AFRIN) 0.05 % nasal spray Place 1 spray into both nostrils 2 (two) times daily as needed for congestion.    . rosuvastatin (CRESTOR) 20 MG tablet Take 20 mg by mouth. Takes once weekly.    . valACYclovir (VALTREX) 1000 MG tablet Take 1,000 mg by mouth daily as needed (outbreaks).      No current facility-administered medications for this visit.     No Known Allergies    Review of Systems negative except from HPI and PMH  Physical Exam BP (!) 149/82   Pulse 81   Ht 6' (1.829 m)   Wt 267 lb 9.6 oz (121.4 kg)   SpO2 93%   BMI 36.29 kg/m  Well developed and nourished in no acute distress HENT normal Neck supple with JVP-flat Clear Regular rate and rhythm, no murmurs or gallops Abd-soft with active BS No Clubbing cyanosis edema Skin-warm and dry A & Oriented  Grossly normal sensory and motor function    ECG sinus @ 81 19/10/36 PVCs RBBB Inferior axis   Assessment and  Plan Diabetes with autonomic neuropathy  Orthostatic hypotension  Abnormal ECG >> no ischemia/infarction   Chronic pain-narcotic dependent  Sleep disordered breathing and daytime somnolence  Depression  Morbidly obese   Memory issues  Have discussed isometric contraction prior to standing and abdominal binder to try to address symptomatic orthostasis without more meds  Also advised they talk to [employer] regarding memory evaluation  We spent more than 50% of our >25 min visit in face to face counseling regarding the above   Current medicines are reviewed at length with the patient today .  The patient does not  have concerns regarding medicines but changes were made As above .

## 2018-05-24 DIAGNOSIS — M6281 Muscle weakness (generalized): Secondary | ICD-10-CM | POA: Diagnosis not present

## 2018-05-24 DIAGNOSIS — M546 Pain in thoracic spine: Secondary | ICD-10-CM | POA: Diagnosis not present

## 2018-05-24 DIAGNOSIS — M545 Low back pain: Secondary | ICD-10-CM | POA: Diagnosis not present

## 2018-05-27 DIAGNOSIS — Z23 Encounter for immunization: Secondary | ICD-10-CM | POA: Diagnosis not present

## 2018-05-27 DIAGNOSIS — Z79899 Other long term (current) drug therapy: Secondary | ICD-10-CM | POA: Diagnosis not present

## 2018-05-27 DIAGNOSIS — M129 Arthropathy, unspecified: Secondary | ICD-10-CM | POA: Diagnosis not present

## 2018-05-27 DIAGNOSIS — M5136 Other intervertebral disc degeneration, lumbar region: Secondary | ICD-10-CM | POA: Diagnosis not present

## 2018-05-27 DIAGNOSIS — E119 Type 2 diabetes mellitus without complications: Secondary | ICD-10-CM | POA: Diagnosis not present

## 2018-05-28 DIAGNOSIS — M545 Low back pain: Secondary | ICD-10-CM | POA: Diagnosis not present

## 2018-05-28 DIAGNOSIS — Z79899 Other long term (current) drug therapy: Secondary | ICD-10-CM | POA: Diagnosis not present

## 2018-05-28 DIAGNOSIS — M5136 Other intervertebral disc degeneration, lumbar region: Secondary | ICD-10-CM | POA: Diagnosis not present

## 2018-05-28 DIAGNOSIS — M859 Disorder of bone density and structure, unspecified: Secondary | ICD-10-CM | POA: Diagnosis not present

## 2018-05-30 DIAGNOSIS — M546 Pain in thoracic spine: Secondary | ICD-10-CM | POA: Diagnosis not present

## 2018-05-30 DIAGNOSIS — M6281 Muscle weakness (generalized): Secondary | ICD-10-CM | POA: Diagnosis not present

## 2018-05-30 DIAGNOSIS — M545 Low back pain: Secondary | ICD-10-CM | POA: Diagnosis not present

## 2018-05-31 IMAGING — MR MR LUMBAR SPINE W/O CM
4 of 6 series · 16 of 48 positions shown · non-contrast
Comparison: Radiography 06/20/2017.  MRI 09/13/2011.

CLINICAL DATA: Severe low back pain for many years.
Spondylolisthesis.

EXAM:
MRI LUMBAR SPINE WITHOUT CONTRAST
TECHNIQUE: Multiplanar, multisequence MR imaging of the lumbar spine was
performed. No intravenous contrast was administered.

[Series 6: T2 · sagittal · 4.0mm · 0.78mm/px · 3 of 17 slices shown (1 of 3)]
[im 1/17]
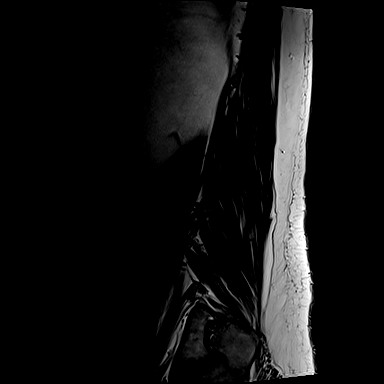
[im 9/17]
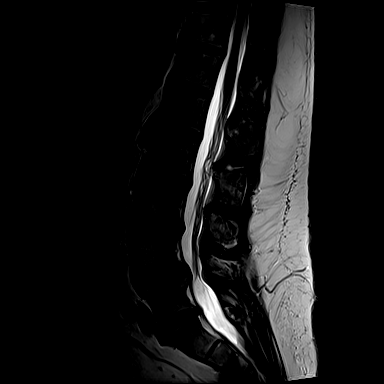
[im 17/17]
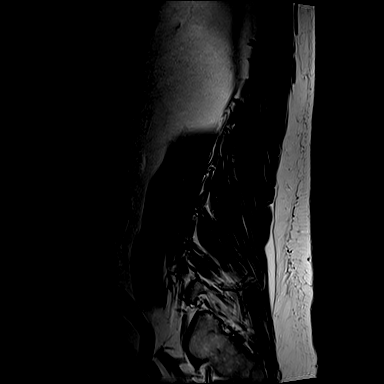

[Series 7: T1 · sagittal · 4.0mm · 0.78mm/px · 3 of 17 slices shown]
[im 1/17]
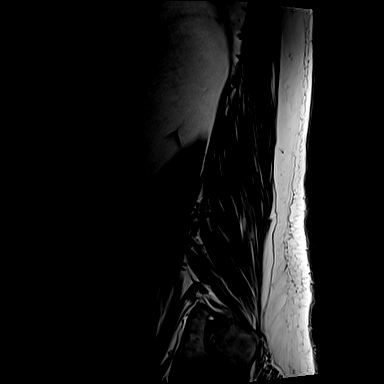
[im 11/17]
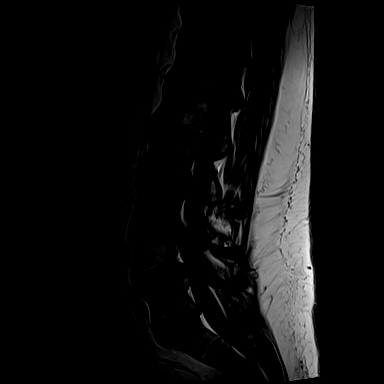
[im 17/17]
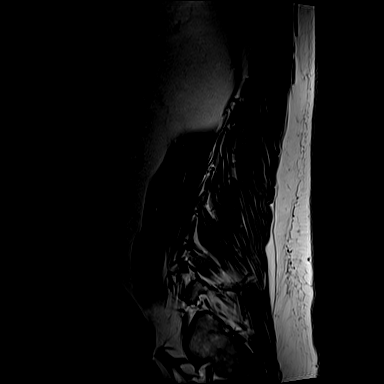

[Series 12: T2 · axial · 4.0mm · 0.28mm/px · z∈[+77,+211]mm · 7 of 33 slices shown (2 of 3)]
[im 1/33]
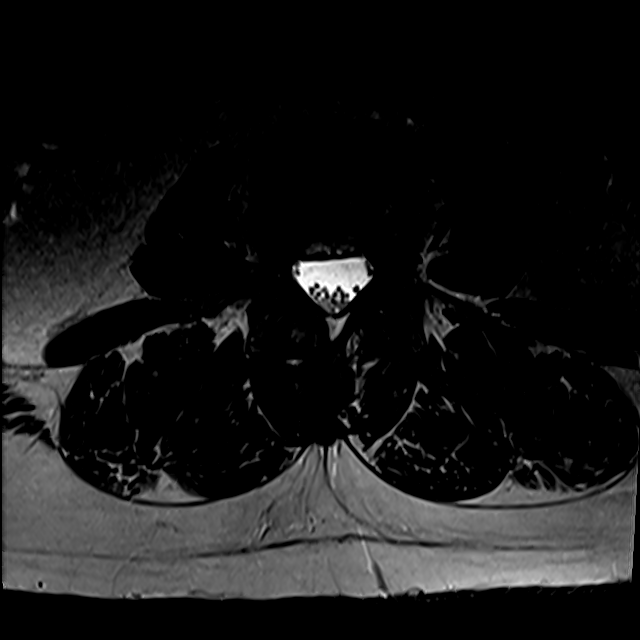
[im 5/33]
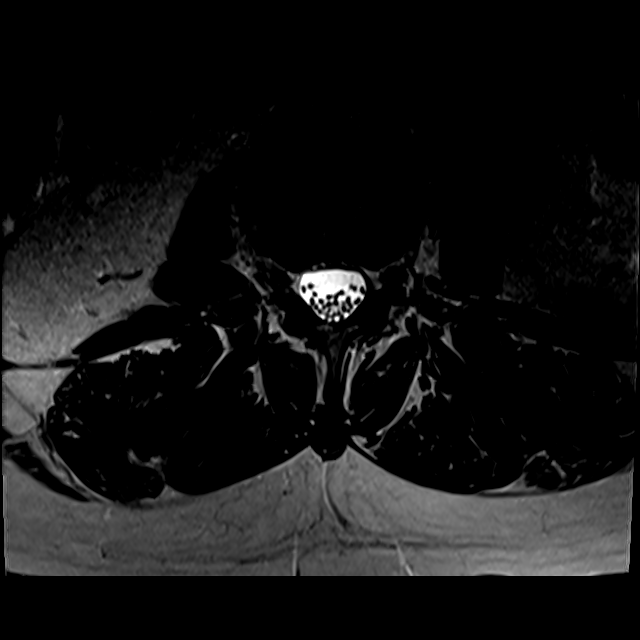
[im 10/33]
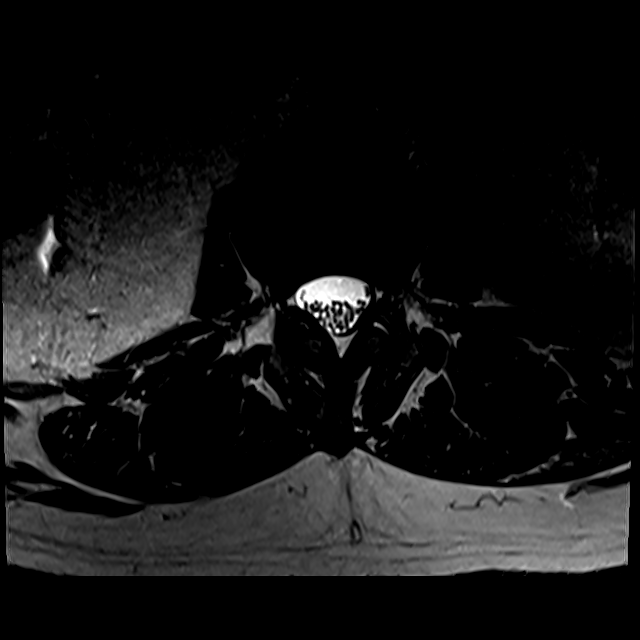
[im 14/33]
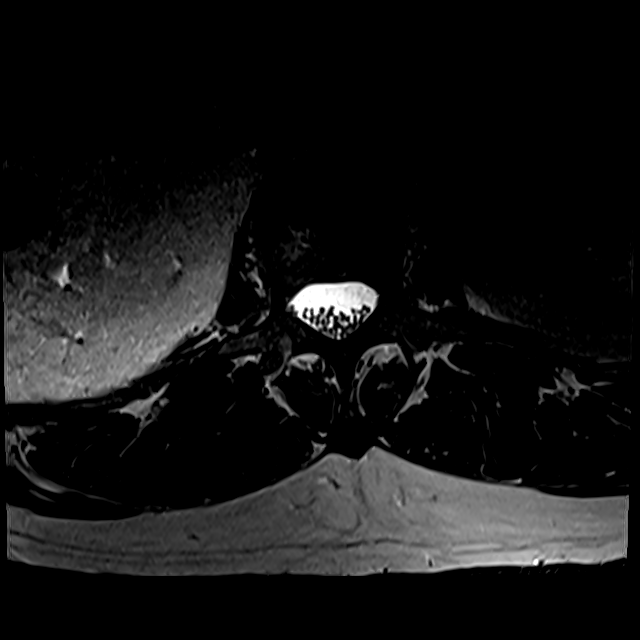
[im 19/33]
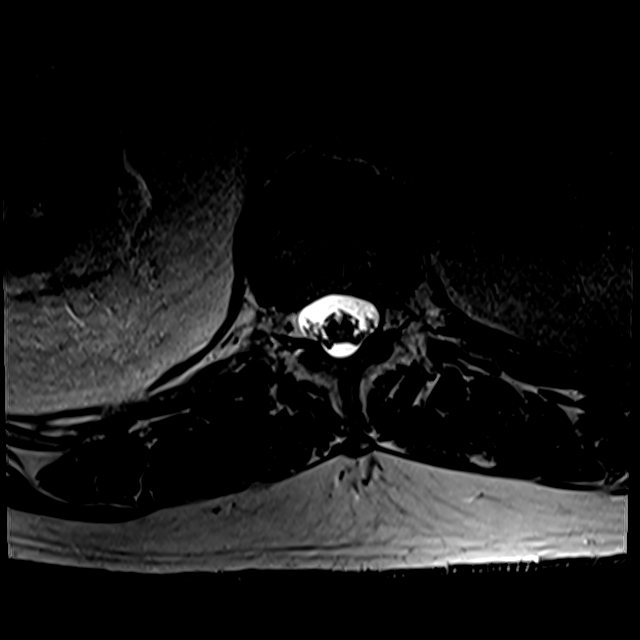
[im 23/33]
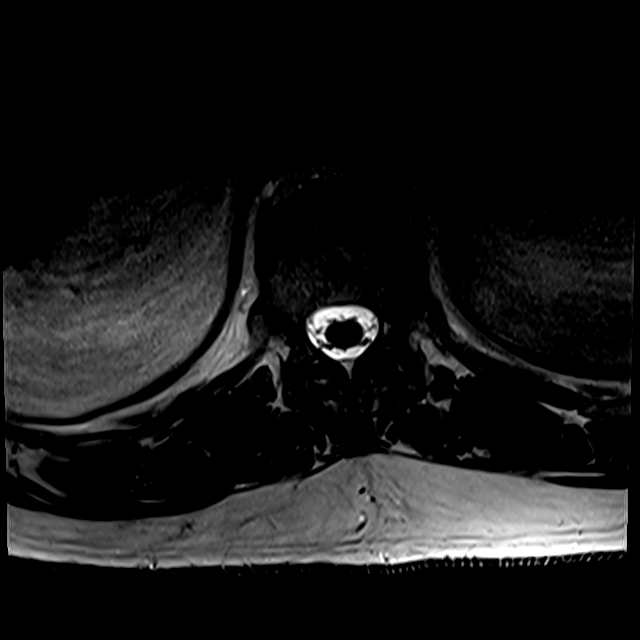
[im 28/33]
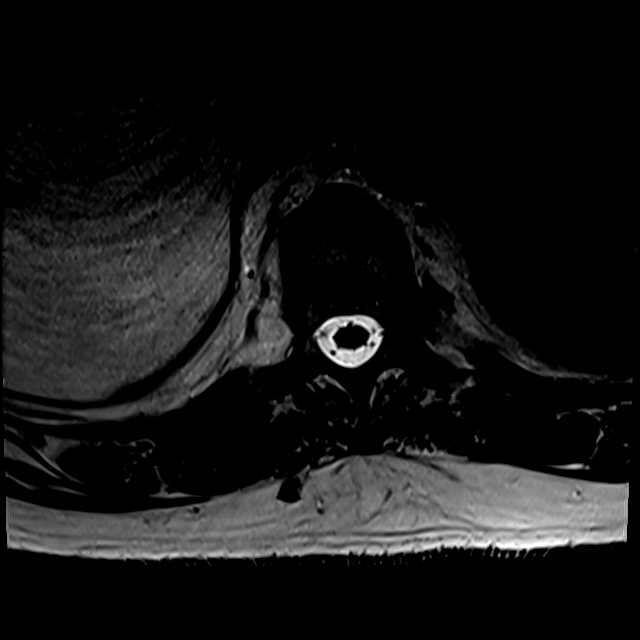

[Series 101: T2 · axial · 4.0mm · 0.28mm/px · z∈[+97,+211]mm · 3 of 66 slices shown (3 of 3)]
[im 10/66]
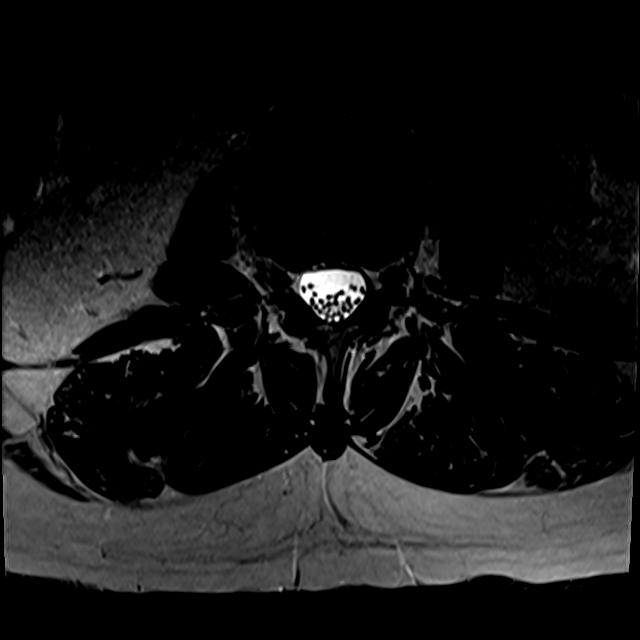
[im 33/66]
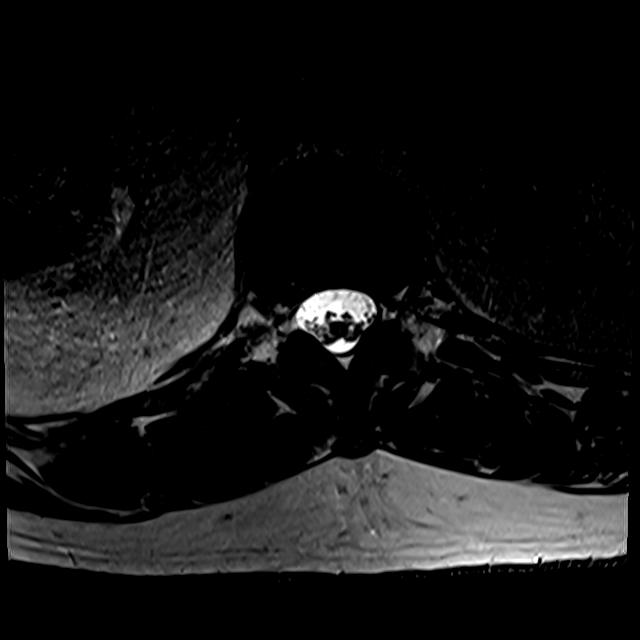
[im 56/66]
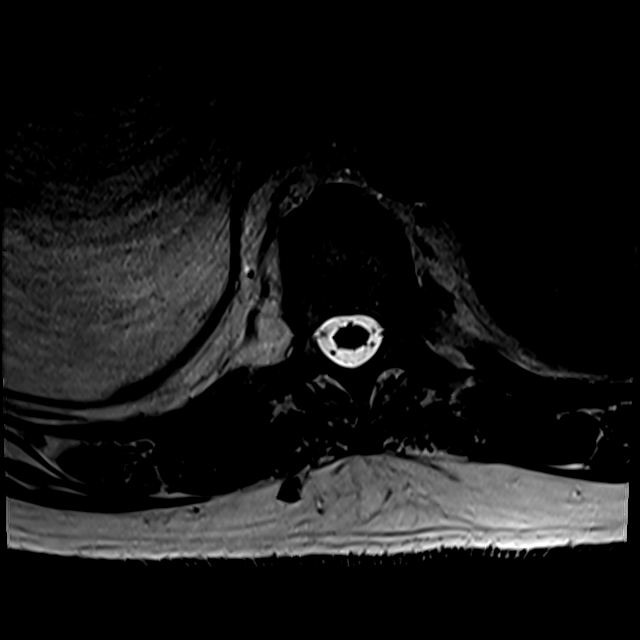

[16 of 48 positions shown; findings below may reference images not displayed]

FINDINGS: Segmentation:  5 lumbar type vertebral bodies.

Alignment:  7 mm anterolisthesis L4-5, increased from 4 mm in 8742.

Vertebrae: Chronic benign hemangioma is within L1, L3 and L4. The L4
hemangioma is large, anterior and central.

Conus medullaris and cauda equina: Conus extends to the L1 level.
Conus and cauda equina appear normal.

Paraspinal and other soft tissues: Negative

Disc levels:

Normal at L1-2 and above.

L2-3: Mild desiccation and bulging of the disc. No stenosis or
neural compression.

L3-4: Mild desiccation and bulging of the disc. No stenosis or
neural compression.

L4-5: Bilateral facet arthropathy with 7 mm anterolisthesis. Disc
degeneration with annular fissures and circumferential protrusion of
the disc. Stenosis of both lateral recesses and neural foramina that
could cause neural compression on either or both sides.

L5-S1: Disc degeneration with shallow disc protrusion but no
compressive effect upon the thecal sac or S1 nerves.
IMPRESSION: Bilateral facet arthropathy at L4-5 allowing anterolisthesis of 7
mm, increased from 4 mm in 8742. Circumferential protrusion of the
disc. Worsened stenosis at this level that could cause neural
compression on either or both sides.

Grossly non-compressive degenerative changes at L2-3, L3-4 and
L5-S1, slightly worsened since the previous exam.

## 2018-06-06 DIAGNOSIS — E1141 Type 2 diabetes mellitus with diabetic mononeuropathy: Secondary | ICD-10-CM | POA: Diagnosis not present

## 2018-06-06 DIAGNOSIS — Z6836 Body mass index (BMI) 36.0-36.9, adult: Secondary | ICD-10-CM | POA: Diagnosis not present

## 2018-06-06 DIAGNOSIS — I951 Orthostatic hypotension: Secondary | ICD-10-CM | POA: Diagnosis not present

## 2018-06-25 DIAGNOSIS — Z79899 Other long term (current) drug therapy: Secondary | ICD-10-CM | POA: Diagnosis not present

## 2018-06-25 DIAGNOSIS — M5136 Other intervertebral disc degeneration, lumbar region: Secondary | ICD-10-CM | POA: Diagnosis not present

## 2018-06-25 DIAGNOSIS — M545 Low back pain: Secondary | ICD-10-CM | POA: Diagnosis not present

## 2018-07-08 DIAGNOSIS — M546 Pain in thoracic spine: Secondary | ICD-10-CM | POA: Diagnosis not present

## 2018-07-08 DIAGNOSIS — M545 Low back pain: Secondary | ICD-10-CM | POA: Diagnosis not present

## 2018-07-08 DIAGNOSIS — M6281 Muscle weakness (generalized): Secondary | ICD-10-CM | POA: Diagnosis not present

## 2018-07-16 DIAGNOSIS — M546 Pain in thoracic spine: Secondary | ICD-10-CM | POA: Diagnosis not present

## 2018-07-16 DIAGNOSIS — M545 Low back pain: Secondary | ICD-10-CM | POA: Diagnosis not present

## 2018-07-16 DIAGNOSIS — M6281 Muscle weakness (generalized): Secondary | ICD-10-CM | POA: Diagnosis not present

## 2018-07-17 DIAGNOSIS — H5211 Myopia, right eye: Secondary | ICD-10-CM | POA: Diagnosis not present

## 2018-07-17 DIAGNOSIS — H1850 Unspecified hereditary corneal dystrophies: Secondary | ICD-10-CM | POA: Diagnosis not present

## 2018-07-17 DIAGNOSIS — Z7984 Long term (current) use of oral hypoglycemic drugs: Secondary | ICD-10-CM | POA: Diagnosis not present

## 2018-07-17 DIAGNOSIS — H1789 Other corneal scars and opacities: Secondary | ICD-10-CM | POA: Diagnosis not present

## 2018-07-17 DIAGNOSIS — H52223 Regular astigmatism, bilateral: Secondary | ICD-10-CM | POA: Diagnosis not present

## 2018-07-17 DIAGNOSIS — H524 Presbyopia: Secondary | ICD-10-CM | POA: Diagnosis not present

## 2018-07-17 DIAGNOSIS — H5202 Hypermetropia, left eye: Secondary | ICD-10-CM | POA: Diagnosis not present

## 2018-07-17 DIAGNOSIS — E119 Type 2 diabetes mellitus without complications: Secondary | ICD-10-CM | POA: Diagnosis not present

## 2018-07-18 DIAGNOSIS — M545 Low back pain: Secondary | ICD-10-CM | POA: Diagnosis not present

## 2018-07-18 DIAGNOSIS — M6281 Muscle weakness (generalized): Secondary | ICD-10-CM | POA: Diagnosis not present

## 2018-07-18 DIAGNOSIS — M546 Pain in thoracic spine: Secondary | ICD-10-CM | POA: Diagnosis not present

## 2018-07-23 DIAGNOSIS — Z79899 Other long term (current) drug therapy: Secondary | ICD-10-CM | POA: Diagnosis not present

## 2018-07-23 DIAGNOSIS — M545 Low back pain: Secondary | ICD-10-CM | POA: Diagnosis not present

## 2018-07-23 DIAGNOSIS — M5136 Other intervertebral disc degeneration, lumbar region: Secondary | ICD-10-CM | POA: Diagnosis not present

## 2018-08-12 NOTE — Progress Notes (Addendum)
[facility]  PATIENT: Devin Combs DOB: February 29, 1951   REASON FOR VISIT: Follow-up of sleep disorder,  ASV compliance HISTORY FROM: Patient and wife    HISTORY OF PRESENT ILLNESS:UPDATE 1/2/2020[name], 67 year old male returns for follow-up with history of obstructive sleep apnea here for ASV compliance.  He no longer has a leak.  He has a good mask fit.  Compliance data dated 07/16/2018-08/14/2018 shows compliance greater than 4 hours at 63%.  Less than 4 hours at 30% for total usage days of 93%.  Average usage 5 hours 4 minutes.  Set pressure 4 to 15 cm.  Leak 95th percentile 9.6 AHI 0.1.  Patient claims his hip pain is usually what wakes him up at night.  He returns for reevaluation    UPDATE 9/3/2019[name], 67 year old male returns for follow-up with a history of obstructive sleep apnea here for ASV compliance.  He continues to have a significant mask leak and wife says he is a mouth breather.  Compliance data dated 03/17/2018-10/2017 shows compliance greater than 4 hours at 47% for 14 days.  Average usage 3 hours 43 minutes.  Set pressure 4 to 15 cm of water.  Leak 95th percentile 36.9.  AHI 3.5.    UPDATE 4/23/2019[name], 67 year old male returns for follow-up with complex sleep disorder here for initial ASV compliance.  He currently has a mask leaking but overall he feels better after being on ASV, less fatigue.  Compliance data dated 11/03/2017- 12/02/2017 shows compliance greater than 4 hours and 21 days or 70%.  Usage days 2830 or 93%.  Average usage 5 hours 42 minutes.  4-15.  AHI 1.  Leaks 95th percentile at 32.8 ESS score 6.  He returns for reevaluation.  He also has a diagnosis of dysautonomia and wants to be referred to [facility] to the POTS and dysautonomia clinic.    05/31/2017 [name] is a 67 year old married Caucasian right-handed male, presenting today upon request of my colleague Dr. Sarina Ill. His body mass index is  currently 73, he was referred for a neuropathy workup and he has carried a long list of diagnoses including type 2 diabetes, degenerative lower back pain, anxiety, hypogonadism, myalgia, hypertension, diabetic peripheral neuropathy, autonomic neuropathy, GERD, major depression orthostatic hypotension. He also has suffered from obstructive sleep apnea in the past but is currently not a CPAP user. He lost weight intermittently which has reduced his snoring but by now his wife is observing snoring and apneas again. Dr. Cathren Laine concern is directed towards terminating an underlying condition that could contribute to atrial fibrillation, strokes, and poorly controlled hypertension.  Sleep habits are as follows: The patient usually watches TV prior to going to bed at about midnight, but on occasion it will be as late as 2 AM. The bedroom is cool, not quiet and dark. He insists on his TV running in the background, much to his wife's dismay. At this time he is able to from to go to sleep, he sleeps usually on his side with one pillow for head support, he also uses a wedge. He will remain asleep for about 2 hours at a time but wakes up frequently in between and terms. He feels that back discomfort is the cause of this. He has nocturia up to 3 times at night. He reports a lot of dreams., is sleep talking.  He does haveurinary retention -it is difficult for him to initiate a steady stream. He wakes up spontaneously about 9:30 AM when  he rises. He estimates over all his nocturnal sleep time to be 8 hours or more. He does not feel refreshed or restored his back hurts him in the morning as well as when he goes to bed.   REVIEW OF SYSTEMS: Full 14 system review of systems performed and notable only for those listed, all others are neg:  Constitutional: Fatigue Cardiovascular: neg Ear/Nose/Throat: neg  Skin: neg Eyes: Blurred vision Respiratory: neg Gastroitestinal: neg  Hematology/Lymphatic: neg  Endocrine:  neg Musculoskeletal: Joint pain, neck pain back pain, hip pain Allergy/Immunology: neg Neurological: neg Psychiatric: neg Sleep : Complex sleep disorder with ASV   ALLERGIES: No Known Allergies  HOME MEDICATIONS: Outpatient Medications Prior to Visit  Medication Sig Dispense Refill  . Alpha-D-Galactosidase (BEANO PO) Take 1 tablet by mouth as needed.    Marland Kitchen Apoaequorin (PREVAGEN) 10 MG CAPS Take 1 capsule by mouth daily.    . diclofenac (VOLTAREN) 50 MG EC tablet Take 50 mg by mouth 2 (two) times daily.    . DULoxetine (CYMBALTA) 60 MG capsule Take 60 mg by mouth every evening.    . fentaNYL (DURAGESIC - DOSED MCG/HR) 75 MCG/HR Place 75 mcg onto the skin every 3 (three) days.     . hydrochlorothiazide (MICROZIDE) 12.5 MG capsule TAKE 1 CAPSULE (12.5 MG TOTAL) EVERY MORNING AS NEEDED FOR A BLOOD PRESSURE GREATER THAN 180. 30 capsule 6  . HYDROcodone-acetaminophen (NORCO) 10-325 MG tablet Take 1 tablet by mouth 3 (three) times daily.    Marland Kitchen lisinopril (PRINIVIL,ZESTRIL) 40 MG tablet TAKE 1 TAB BY MOUTH EVERY EVENING. PATIENT NEEDS TO CALL AND SCHEDULE AN APPT FOR FURTHER REFILLS 15 tablet 0  . metFORMIN (GLUCOPHAGE) 1000 MG tablet Take 1,000 mg by mouth 2 (two) times daily with a meal.    . methocarbamol (ROBAXIN) 500 MG tablet Take 1 tablet (500 mg total) by mouth 4 (four) times daily. (Patient taking differently: Take 500 mg by mouth daily as needed. ) 30 tablet 1  . omeprazole (PRILOSEC) 20 MG capsule Take 20 mg by mouth every morning.  5  . oxymetazoline (AFRIN) 0.05 % nasal spray Place 1 spray into both nostrils 2 (two) times daily as needed for congestion.    . rosuvastatin (CRESTOR) 20 MG tablet Take 20 mg by mouth. Takes once weekly.    . valACYclovir (VALTREX) 1000 MG tablet Take 1,000 mg by mouth daily as needed (outbreaks).     Marland Kitchen aluminum chloride (DRYSOL) 20 % external solution Apply 1 application topically every 7 (seven) days.     . diazepam (VALIUM) 5 MG tablet Take 1 tablet (5  mg total) by mouth every 6 (six) hours as needed. AS NEEDED FOR MUSCLE SPASMS 80 tablet 1   No facility-administered medications prior to visit.     PAST MEDICAL HISTORY: Past Medical History:  Diagnosis Date  . Anxiety   . Arthritis   . Cancer (HCC)    basal and squamous cell carcinoma  . Chronic back pain   . DDD (degenerative disc disease), thoracolumbar   . Degenerative cervical spinal stenosis   . Depression   . Diabetic autonomic neuropathy ([place])   . Dyspnea    with exertion   . GERD (gastroesophageal reflux disease)   . Headache   . History of bronchitis   . History of kidney stones   . Hyperlipidemia   . Hypertension   . Hypogonadism in male   . Myocardial infarction ([place])   . Neuropathy   . Orthostatic hypotension   .  OSA (obstructive sleep apnea)    per pt study done 2006 (approx)  used cpap few yrs then stopped using, stated didn't feel he needed it anymore  . Psoriasis   . Retinal tear   . Right knee meniscal tear   . Type 2 diabetes mellitus ([place])   . Urinary hesitancy   . Vitamin D deficiency     PAST SURGICAL HISTORY: Past Surgical History:  Procedure Laterality Date  . CATARACT EXTRACTION W/ INTRAOCULAR LENS  IMPLANT, BILATERAL  right 2015//  left 2012  . CORRECTION REPAIR MULTIPLE  TOES , LEFT FOOT  YRS AGO  . EXTRACORPOREAL SHOCK WAVE LITHOTRIPSY  2000 approx  . KNEE ARTHROSCOPY Left 2007  . KNEE ARTHROSCOPY Right 02/10/2015   Procedure: ARTHROSCOPY RIGHT KNEE WITH DEBRIDEMENT AND CHRONDROPLASTY;  Surgeon: Gaynelle Arabian, MD;  Location: [facility];  Service: Orthopedics;  Laterality: Right;  . PARTIAL KNEE ARTHROPLASTY Right 07/19/2016   Procedure: RIGHT KNEE MEDICAL UNICOMPARTMENTAL ARTHROPLASTY;  Surgeon: Gaynelle Arabian, MD;  Location: [facility];  Service: Orthopedics;  Laterality: Right;  . RADICAL ORCHIECTOMY Left 1996   benign  . REMOVAL SPURS RIGHT GREAT TOE  YRS AGO  . TOTAL KNEE ARTHROPLASTY Left 09-16-2007    FAMILY  HISTORY: Family History  Problem Relation Age of Onset  . Neuropathy Neg Hx     SOCIAL HISTORY: Social History   Socioeconomic History  . Marital status: Married    Spouse name: Not on file  . Number of children: 1  . Years of education: Not on file  . Highest education level: Some college, no degree  Occupational History  . Not on file  Social Needs  . Financial resource strain: Not on file  . Food insecurity:    Worry: Not on file    Inability: Not on file  . Transportation needs:    Medical: Not on file    Non-medical: Not on file  Tobacco Use  . Smoking status: Never Smoker  . Smokeless tobacco: Never Used  Substance and Sexual Activity  . Alcohol use: Yes    Comment: rare  . Drug use: No  . Sexual activity: Not on file  Lifestyle  . Physical activity:    Days per week: Not on file    Minutes per session: Not on file  . Stress: Not on file  Relationships  . Social connections:    Talks on phone: Not on file    Gets together: Not on file    Attends religious service: Not on file    Active member of club or organization: Not on file    Attends meetings of clubs or organizations: Not on file    Relationship status: Not on file  . Intimate partner violence:    Fear of current or ex partner: Not on file    Emotionally abused: Not on file    Physically abused: Not on file    Forced sexual activity: Not on file  Other Topics Concern  . Not on file  Social History Narrative   Lives at home with wife, who is Therapist, sports.  Pt is retired.  Caffeine 2-3 cups per week. Right handed.      PHYSICAL EXAM  Vitals:   08/15/18 1410  BP: 130/80  Pulse: 95  Weight: 263 lb 12.8 oz (119.7 kg)  Height: 6' (1.829 m)   Body mass index is 35.78 kg/m.  Generalized: Well developed, obese male in no acute distress  Head: normocephalic and atraumatic,. Oropharynx  benign mallopatti 4 Neck: Supple, circumference 20 Musculoskeletal: No deformity   Neurological examination    Mentation: Alert oriented to time, place, history taking. Attention span and concentration appropriate. Recent and remote memory intact.  Follows all commands speech and language fluent.   Cranial nerve II-XII: Pupils were equal round reactive to light extraocular movements were full, visual field were full on confrontational test. Facial sensation and strength were normal. hearing was intact to finger rubbing bilaterally. Uvula tongue midline. head turning and shoulder shrug were normal and symmetric.Tongue protrusion into cheek strength was normal. Motor: normal bulk and tone, full strength in the BUE, BLE, fine finger movements normal, no pronator drift. No focal weakness Sensory: normal and symmetric to light touch,   Coordination: finger-nose-finger, heel-to-shin bilaterally, no dysmetria Gait and Station: Rising up from seated position without assistance, normal stance,  moderate stride, good arm swing, smooth turning, able to perform tiptoe, and heel walking without difficulty. Tandem gait is steady  DIAGNOSTIC DATA (LABS, IMAGING, TESTING) - I reviewed patient records, labs, notes, testing and imaging myself where available.  Lab Results  Component Value Date   WBC 13.9 (H) 07/20/2016   HGB 13.4 07/20/2016   HCT 41.3 07/20/2016   MCV 88.8 07/20/2016   PLT 280 07/20/2016      Component Value Date/Time   NA 143 05/02/2017 1144   K 4.6 05/02/2017 1144   CL 99 05/02/2017 1144   CO2 24 05/02/2017 1144   GLUCOSE 181 (H) 05/02/2017 1144   GLUCOSE 227 (H) 07/20/2016 0405   BUN 17 05/02/2017 1144   CREATININE 1.22 05/02/2017 1144   CALCIUM 10.6 (H) 05/02/2017 1144   PROT 7.3 05/02/2017 1144   ALBUMIN 4.0 07/13/2016 1434   AST 25 07/13/2016 1434   ALT 35 07/13/2016 1434   ALKPHOS 94 07/13/2016 1434   BILITOT 0.7 07/13/2016 1434   GFRNONAA 62 05/02/2017 1144   GFRAA 71 05/02/2017 1144    Lab Results  Component Value Date   HGBA1C 6.5 (H) 07/13/2016    ASSESSMENT AND  PLAN  67 y.o. year old male  has a past medical history of Anxiety, Arthritis, Cancer ([place]), Chronic back pain, DDD (degenerative disc disease), thoracolumbar, Degenerative cervical spinal stenosis, Depression, Diabetic autonomic neuropathy ([place]), Dyspnea, GERD (gastroesophageal reflux disease), Headache, History of bronchitis, History of kidney stones, Hyperlipidemia, Hypertension, Hypogonadism in male, Myocardial infarction ([place]), Neuropathy, Orthostatic hypotension, OSA (obstructive sleep apnea), Psoriasis, Retinal tear, Right knee meniscal tear, Type 2 diabetes mellitus ([place]), Urinary hesitancy, and Vitamin D deficiency. here  To follow up for complex sleep disorder and ASV compliance. Compliance data dated 07/16/2018-08/14/2018 shows compliance greater than 4 hours at 63%.  Less than 4 hours at 30% for total usage days of 93%.  Average usage 5 hours 4 minutes.  Set pressure 4 to 15 cm.  Leak 95th percentile 9.6 AHI 0.1. The patient is a current patient of Dr. Brett Fairy who is out of the office today . This note is sent to the work in doctor.     ASV compliance 63% Tried to increase usage time every night Continue same settings F/U in 6 months Dennie Bible, [facility], [facility], APRN  [facility] [address] [place], [place] [address] [phone]  I reviewed the above note and documentation by the Nurse Practitioner and agree with the history, physical exam, assessment and plan as outlined above. I was immediately available for face-to-face consultation. Star Age, MD, PhD [facility])

## 2018-08-14 ENCOUNTER — Encounter: Payer: Self-pay | Admitting: Nurse Practitioner

## 2018-08-15 ENCOUNTER — Ambulatory Visit (INDEPENDENT_AMBULATORY_CARE_PROVIDER_SITE_OTHER): Payer: Medicare Other | Admitting: Nurse Practitioner

## 2018-08-15 ENCOUNTER — Encounter: Payer: Self-pay | Admitting: Nurse Practitioner

## 2018-08-15 VITALS — BP 130/80 | HR 95 | Ht 72.0 in | Wt 263.8 lb

## 2018-08-15 DIAGNOSIS — G4731 Primary central sleep apnea: Secondary | ICD-10-CM | POA: Diagnosis not present

## 2018-08-15 NOTE — Patient Instructions (Signed)
ASV compliance 63% Continue same settings F/U in 6 months

## 2018-08-22 DIAGNOSIS — M5136 Other intervertebral disc degeneration, lumbar region: Secondary | ICD-10-CM | POA: Diagnosis not present

## 2018-08-22 DIAGNOSIS — Z79899 Other long term (current) drug therapy: Secondary | ICD-10-CM | POA: Diagnosis not present

## 2018-08-22 DIAGNOSIS — G8929 Other chronic pain: Secondary | ICD-10-CM | POA: Diagnosis not present

## 2018-08-22 DIAGNOSIS — M545 Low back pain: Secondary | ICD-10-CM | POA: Diagnosis not present

## 2018-09-06 DIAGNOSIS — G4733 Obstructive sleep apnea (adult) (pediatric): Secondary | ICD-10-CM | POA: Diagnosis not present

## 2018-09-06 DIAGNOSIS — I10 Essential (primary) hypertension: Secondary | ICD-10-CM | POA: Diagnosis not present

## 2018-09-06 DIAGNOSIS — E7800 Pure hypercholesterolemia, unspecified: Secondary | ICD-10-CM | POA: Diagnosis not present

## 2018-09-06 DIAGNOSIS — Z125 Encounter for screening for malignant neoplasm of prostate: Secondary | ICD-10-CM | POA: Diagnosis not present

## 2018-09-06 DIAGNOSIS — K219 Gastro-esophageal reflux disease without esophagitis: Secondary | ICD-10-CM | POA: Diagnosis not present

## 2018-09-06 DIAGNOSIS — E1165 Type 2 diabetes mellitus with hyperglycemia: Secondary | ICD-10-CM | POA: Diagnosis not present

## 2018-09-06 DIAGNOSIS — I951 Orthostatic hypotension: Secondary | ICD-10-CM | POA: Diagnosis not present

## 2018-09-06 DIAGNOSIS — E1141 Type 2 diabetes mellitus with diabetic mononeuropathy: Secondary | ICD-10-CM | POA: Diagnosis not present

## 2018-09-06 DIAGNOSIS — E291 Testicular hypofunction: Secondary | ICD-10-CM | POA: Diagnosis not present

## 2018-09-06 DIAGNOSIS — Z0000 Encounter for general adult medical examination without abnormal findings: Secondary | ICD-10-CM | POA: Diagnosis not present

## 2018-09-06 DIAGNOSIS — F324 Major depressive disorder, single episode, in partial remission: Secondary | ICD-10-CM | POA: Diagnosis not present

## 2018-09-20 DIAGNOSIS — M545 Low back pain: Secondary | ICD-10-CM | POA: Diagnosis not present

## 2018-09-20 DIAGNOSIS — M5136 Other intervertebral disc degeneration, lumbar region: Secondary | ICD-10-CM | POA: Diagnosis not present

## 2018-09-20 DIAGNOSIS — G8929 Other chronic pain: Secondary | ICD-10-CM | POA: Diagnosis not present

## 2018-09-20 DIAGNOSIS — E119 Type 2 diabetes mellitus without complications: Secondary | ICD-10-CM | POA: Diagnosis not present

## 2018-09-20 DIAGNOSIS — Z79899 Other long term (current) drug therapy: Secondary | ICD-10-CM | POA: Diagnosis not present

## 2018-10-18 DIAGNOSIS — Z79899 Other long term (current) drug therapy: Secondary | ICD-10-CM | POA: Diagnosis not present

## 2018-10-18 DIAGNOSIS — M5136 Other intervertebral disc degeneration, lumbar region: Secondary | ICD-10-CM | POA: Diagnosis not present

## 2018-10-18 DIAGNOSIS — M545 Low back pain: Secondary | ICD-10-CM | POA: Diagnosis not present

## 2018-10-18 DIAGNOSIS — B351 Tinea unguium: Secondary | ICD-10-CM | POA: Diagnosis not present

## 2018-10-18 DIAGNOSIS — G8929 Other chronic pain: Secondary | ICD-10-CM | POA: Diagnosis not present

## 2018-10-29 ENCOUNTER — Inpatient Hospital Stay (HOSPITAL_COMMUNITY)
Admission: EM | Admit: 2018-10-29 | Discharge: 2018-11-01 | DRG: 246 | Disposition: A | Discharge status: Home / Self Care | Payer: Medicare Other | Attending: Cardiovascular Disease | Admitting: Cardiovascular Disease

## 2018-10-29 ENCOUNTER — Other Ambulatory Visit: Payer: Self-pay

## 2018-10-29 ENCOUNTER — Encounter (HOSPITAL_COMMUNITY): Payer: Self-pay | Admitting: Emergency Medicine

## 2018-10-29 ENCOUNTER — Emergency Department (HOSPITAL_COMMUNITY): Payer: Medicare Other

## 2018-10-29 DIAGNOSIS — Z7984 Long term (current) use of oral hypoglycemic drugs: Secondary | ICD-10-CM

## 2018-10-29 DIAGNOSIS — F324 Major depressive disorder, single episode, in partial remission: Secondary | ICD-10-CM | POA: Diagnosis not present

## 2018-10-29 DIAGNOSIS — K219 Gastro-esophageal reflux disease without esophagitis: Secondary | ICD-10-CM | POA: Diagnosis present

## 2018-10-29 DIAGNOSIS — Z85828 Personal history of other malignant neoplasm of skin: Secondary | ICD-10-CM

## 2018-10-29 DIAGNOSIS — R451 Restlessness and agitation: Secondary | ICD-10-CM | POA: Diagnosis present

## 2018-10-29 DIAGNOSIS — I110 Hypertensive heart disease with heart failure: Secondary | ICD-10-CM | POA: Diagnosis present

## 2018-10-29 DIAGNOSIS — G894 Chronic pain syndrome: Secondary | ICD-10-CM | POA: Diagnosis present

## 2018-10-29 DIAGNOSIS — F05 Delirium due to known physiological condition: Secondary | ICD-10-CM | POA: Diagnosis not present

## 2018-10-29 DIAGNOSIS — E1141 Type 2 diabetes mellitus with diabetic mononeuropathy: Secondary | ICD-10-CM | POA: Diagnosis not present

## 2018-10-29 DIAGNOSIS — I5021 Acute systolic (congestive) heart failure: Secondary | ICD-10-CM | POA: Diagnosis present

## 2018-10-29 DIAGNOSIS — E785 Hyperlipidemia, unspecified: Secondary | ICD-10-CM

## 2018-10-29 DIAGNOSIS — I10 Essential (primary) hypertension: Secondary | ICD-10-CM

## 2018-10-29 DIAGNOSIS — R079 Chest pain, unspecified: Secondary | ICD-10-CM | POA: Diagnosis not present

## 2018-10-29 DIAGNOSIS — E876 Hypokalemia: Secondary | ICD-10-CM | POA: Diagnosis present

## 2018-10-29 DIAGNOSIS — Z8249 Family history of ischemic heart disease and other diseases of the circulatory system: Secondary | ICD-10-CM | POA: Diagnosis not present

## 2018-10-29 DIAGNOSIS — I252 Old myocardial infarction: Secondary | ICD-10-CM

## 2018-10-29 DIAGNOSIS — G4733 Obstructive sleep apnea (adult) (pediatric): Secondary | ICD-10-CM | POA: Diagnosis present

## 2018-10-29 DIAGNOSIS — I214 Non-ST elevation (NSTEMI) myocardial infarction: Secondary | ICD-10-CM | POA: Diagnosis not present

## 2018-10-29 DIAGNOSIS — Z96653 Presence of artificial knee joint, bilateral: Secondary | ICD-10-CM | POA: Diagnosis present

## 2018-10-29 DIAGNOSIS — E1143 Type 2 diabetes mellitus with diabetic autonomic (poly)neuropathy: Secondary | ICD-10-CM | POA: Diagnosis present

## 2018-10-29 DIAGNOSIS — I4510 Unspecified right bundle-branch block: Secondary | ICD-10-CM | POA: Diagnosis present

## 2018-10-29 DIAGNOSIS — I2109 ST elevation (STEMI) myocardial infarction involving other coronary artery of anterior wall: Secondary | ICD-10-CM | POA: Diagnosis not present

## 2018-10-29 DIAGNOSIS — I2510 Atherosclerotic heart disease of native coronary artery without angina pectoris: Secondary | ICD-10-CM | POA: Diagnosis not present

## 2018-10-29 DIAGNOSIS — E1165 Type 2 diabetes mellitus with hyperglycemia: Secondary | ICD-10-CM | POA: Diagnosis not present

## 2018-10-29 DIAGNOSIS — I639 Cerebral infarction, unspecified: Secondary | ICD-10-CM | POA: Diagnosis not present

## 2018-10-29 DIAGNOSIS — G4731 Primary central sleep apnea: Secondary | ICD-10-CM | POA: Diagnosis not present

## 2018-10-29 DIAGNOSIS — E1142 Type 2 diabetes mellitus with diabetic polyneuropathy: Secondary | ICD-10-CM | POA: Diagnosis not present

## 2018-10-29 DIAGNOSIS — F419 Anxiety disorder, unspecified: Secondary | ICD-10-CM | POA: Diagnosis not present

## 2018-10-29 DIAGNOSIS — E119 Type 2 diabetes mellitus without complications: Secondary | ICD-10-CM | POA: Diagnosis not present

## 2018-10-29 DIAGNOSIS — Z79899 Other long term (current) drug therapy: Secondary | ICD-10-CM | POA: Diagnosis not present

## 2018-10-29 DIAGNOSIS — Z955 Presence of coronary angioplasty implant and graft: Secondary | ICD-10-CM

## 2018-10-29 DIAGNOSIS — Z9079 Acquired absence of other genital organ(s): Secondary | ICD-10-CM

## 2018-10-29 DIAGNOSIS — G9340 Encephalopathy, unspecified: Secondary | ICD-10-CM | POA: Diagnosis not present

## 2018-10-29 DIAGNOSIS — E1140 Type 2 diabetes mellitus with diabetic neuropathy, unspecified: Secondary | ICD-10-CM | POA: Diagnosis not present

## 2018-10-29 DIAGNOSIS — E782 Mixed hyperlipidemia: Secondary | ICD-10-CM | POA: Diagnosis present

## 2018-10-29 HISTORY — DX: Acute systolic (congestive) heart failure: I50.21

## 2018-10-29 HISTORY — DX: Non-ST elevation (NSTEMI) myocardial infarction: I21.4

## 2018-10-29 LAB — BASIC METABOLIC PANEL
Anion gap: 12 (ref 5–15)
BUN: 23 mg/dL (ref 8–23)
CHLORIDE: 101 mmol/L (ref 98–111)
CO2: 26 mmol/L (ref 22–32)
Calcium: 9.9 mg/dL (ref 8.9–10.3)
Creatinine, Ser: 1.16 mg/dL (ref 0.61–1.24)
GFR calc Af Amer: >60 mL/min (ref >60)
GLUCOSE: 188 mg/dL — AB (ref 70–99)
POTASSIUM: 4.3 mmol/L (ref 3.5–5.1)
Sodium: 139 mmol/L (ref 135–145)

## 2018-10-29 LAB — CBC
HCT: 45.2 % (ref 39.0–52.0)
HEMOGLOBIN: 15 g/dL (ref 13.0–17.0)
MCH: 30.4 pg (ref 26.0–34.0)
MCHC: 33.2 g/dL (ref 30.0–36.0)
MCV: 91.5 fL (ref 80.0–100.0)
Platelets: 283 10*3/uL (ref 150–400)
RBC: 4.94 MIL/uL (ref 4.22–5.81)
RDW: 13.1 % (ref 11.5–15.5)
WBC: 9.6 10*3/uL (ref 4.0–10.5)
nRBC: 0 % (ref 0.0–0.2)

## 2018-10-29 LAB — POCT I-STAT TROPONIN I: TROPONIN I, POC: 0.54 ng/mL — AB (ref 0.00–0.08)

## 2018-10-29 LAB — TROPONIN I: Troponin I: 1.04 ng/mL (ref <0.03)

## 2018-10-29 MED ORDER — INSULIN ASPART 100 UNIT/ML ~~LOC~~ SOLN
0.0000 [IU] | Freq: Three times a day (TID) | SUBCUTANEOUS | Status: DC
  Administered 2018-10-30 (×2): 3 [IU] via SUBCUTANEOUS
  Administered 2018-10-30 – 2018-10-31 (×2): 2 [IU] via SUBCUTANEOUS
  Administered 2018-10-31: 3 [IU] via SUBCUTANEOUS
  Administered 2018-11-01: 5 [IU] via SUBCUTANEOUS
  Administered 2018-11-01: 3 [IU] via SUBCUTANEOUS

## 2018-10-29 MED ORDER — SODIUM CHLORIDE 0.9 % IV SOLN: 250.0000 mL | INTRAVENOUS | Status: DC | PRN

## 2018-10-29 MED ORDER — DULOXETINE HCL 60 MG PO CPEP
60.0000 mg | ORAL_CAPSULE | Freq: Every evening | ORAL | Status: DC
  Administered 2018-10-29 – 2018-10-31 (×3): 60 mg via ORAL
  Filled 2018-10-29 (×3): qty 1

## 2018-10-29 MED ORDER — ALUM & MAG HYDROXIDE-SIMETH 200-200-20 MG/5ML PO SUSP
30.0000 mL | ORAL | Status: DC | PRN
  Administered 2018-10-29: 30 mL via ORAL
  Filled 2018-10-29: qty 30

## 2018-10-29 MED ORDER — FENTANYL 50 MCG/HR TD PT72
1.0000 | MEDICATED_PATCH | TRANSDERMAL | Status: DC
  Administered 2018-10-30 – 2018-10-31 (×2): 1 via TRANSDERMAL
  Filled 2018-10-29 (×2): qty 1

## 2018-10-29 MED ORDER — SODIUM CHLORIDE 0.9% FLUSH: 3.0000 mL | Freq: Two times a day (BID) | INTRAVENOUS | Status: DC

## 2018-10-29 MED ORDER — NITROGLYCERIN 0.4 MG SL SUBL: 0.4000 mg | SUBLINGUAL_TABLET | SUBLINGUAL | Status: DC | PRN

## 2018-10-29 MED ORDER — SODIUM CHLORIDE 0.9 % WEIGHT BASED INFUSION
3.0000 mL/kg/h | INTRAVENOUS | Status: DC
  Administered 2018-10-30: 3 mL/kg/h via INTRAVENOUS

## 2018-10-29 MED ORDER — HYDROCODONE-ACETAMINOPHEN 10-325 MG PO TABS
1.0000 | ORAL_TABLET | Freq: Four times a day (QID) | ORAL | Status: DC
  Administered 2018-10-29 – 2018-11-01 (×10): 1 via ORAL
  Filled 2018-10-29 (×10): qty 1

## 2018-10-29 MED ORDER — SODIUM CHLORIDE 0.9 % WEIGHT BASED INFUSION: 1.0000 mL/kg/h | INTRAVENOUS | Status: DC

## 2018-10-29 MED ORDER — NITROGLYCERIN IN D5W 200-5 MCG/ML-% IV SOLN
0.0000 ug/min | INTRAVENOUS | Status: DC
  Administered 2018-10-29: 5 ug/min via INTRAVENOUS
  Filled 2018-10-29: qty 250

## 2018-10-29 MED ORDER — METOPROLOL TARTRATE 12.5 MG HALF TABLET
12.5000 mg | ORAL_TABLET | Freq: Two times a day (BID) | ORAL | Status: DC
  Administered 2018-10-29 – 2018-10-30 (×3): 12.5 mg via ORAL
  Filled 2018-10-29 (×3): qty 1

## 2018-10-29 MED ORDER — PANTOPRAZOLE SODIUM 40 MG PO TBEC: 40.0000 mg | DELAYED_RELEASE_TABLET | Freq: Every day | ORAL | Status: DC

## 2018-10-29 MED ORDER — SODIUM CHLORIDE 0.9% FLUSH: 3.0000 mL | INTRAVENOUS | Status: DC | PRN

## 2018-10-29 MED ORDER — ROSUVASTATIN CALCIUM 20 MG PO TABS
20.0000 mg | ORAL_TABLET | ORAL | Status: DC
  Administered 2018-10-30: 20 mg via ORAL
  Filled 2018-10-29: qty 1

## 2018-10-29 MED ORDER — DICLOFENAC SODIUM 50 MG PO TBEC: 50.0000 mg | DELAYED_RELEASE_TABLET | Freq: Two times a day (BID) | ORAL | Status: DC

## 2018-10-29 MED ORDER — ASPIRIN 81 MG PO CHEW
81.0000 mg | CHEWABLE_TABLET | ORAL | Status: AC
  Administered 2018-10-30: 81 mg via ORAL
  Filled 2018-10-29: qty 1

## 2018-10-29 MED ORDER — BUPROPION HCL ER (XL) 300 MG PO TB24
300.0000 mg | ORAL_TABLET | Freq: Every day | ORAL | Status: DC
  Administered 2018-10-30 – 2018-11-01 (×3): 300 mg via ORAL
  Filled 2018-10-29: qty 2
  Filled 2018-10-29: qty 1
  Filled 2018-10-29: qty 2

## 2018-10-29 MED ORDER — LISINOPRIL 20 MG PO TABS
40.0000 mg | ORAL_TABLET | Freq: Every evening | ORAL | Status: DC
  Administered 2018-10-29 – 2018-10-30 (×2): 40 mg via ORAL
  Filled 2018-10-29: qty 2
  Filled 2018-10-29: qty 1

## 2018-10-29 MED ORDER — HEPARIN BOLUS VIA INFUSION
4000.0000 [IU] | Freq: Once | INTRAVENOUS | Status: AC
  Administered 2018-10-29: 4000 [IU] via INTRAVENOUS
  Filled 2018-10-29: qty 4000

## 2018-10-29 MED ORDER — HEPARIN (PORCINE) 25000 UT/250ML-% IV SOLN
1750.0000 [IU]/h | INTRAVENOUS | Status: DC
Start: 2018-10-29 — End: 2018-10-31
  Administered 2018-10-29: 1400 [IU]/h via INTRAVENOUS
  Administered 2018-10-30: 1750 [IU]/h via INTRAVENOUS
  Filled 2018-10-29 (×2): qty 250

## 2018-10-29 MED ORDER — ASPIRIN EC 81 MG PO TBEC
81.0000 mg | DELAYED_RELEASE_TABLET | Freq: Every day | ORAL | Status: DC
  Administered 2018-10-30 – 2018-11-01 (×3): 81 mg via ORAL
  Filled 2018-10-29 (×3): qty 1

## 2018-10-29 NOTE — ED Notes (Signed)
[facility] at bedside 

## 2018-10-29 NOTE — ED Notes (Signed)
Cardiology at bedside.

## 2018-10-29 NOTE — Progress Notes (Signed)
Anna for heparin Indication: chest pain/ACS  No Known Allergies  Patient Measurements: Height: 6\' 1"  (185.4 cm) Weight: 260 lb (117.9 kg) IBW/kg (Calculated) : 79.9 Heparin Dosing Weight: 105 kg  Vital Signs: Temp: 98.2 F (36.8 C) (03/17 1621) Temp Source: Oral (03/17 1621) BP: 168/104 (03/17 1621) Pulse Rate: 100 (03/17 1621)  Labs: Recent Labs    10/29/18 1605  HGB 15.0  HCT 45.2  PLT 283  CREATININE 1.16    Estimated Creatinine Clearance: 83.1 mL/min (by C-G formula based on SCr of 1.16 mg/dL).   Assessment: Patient is a 68 y.o M presented to the ED on 3/17 with c/o CP and with elevated troponin.  To start heparin drip for suspected ACS.  Goal of Therapy:  Heparin level 0.3-0.7 units/ml Monitor platelets by anticoagulation protocol: Yes   Plan:  - heparin 4000 units IV x1 bolus, then drip at 1400 units/hr - check 6 hr heparin level - monitor for s/s bleeding  Airica Schwartzkopf P 10/29/2018,5:07 PM

## 2018-10-29 NOTE — ED Notes (Signed)
Attempted to call report to 6E-unable to take at this time, informed will call when available.

## 2018-10-29 NOTE — ED Notes (Signed)
[facility] contacted. Paperwork printed.  

## 2018-10-29 NOTE — ED Notes (Signed)
RN AND MD NOTIFIED OF PATIENTS TROPONIN LEVEL OF 0.54

## 2018-10-29 NOTE — H&P (Addendum)
Cardiology Admission History and Physical:   Patient ID: Devin Combs MRN: [id number]; DOB: 11-03-1950   Admission date: 10/29/2018  Primary Care Provider: Maury Dus, MD Primary Cardiologist: No primary care provider on file.  Primary Electrophysiologist:  None   Chief Complaint:  Chest Pain  Patient Profile:   Devin Combs is a 68 y.o. male with no history of coronary artery disease who now presents with chest pain and elevated troponin  History of Present Illness:   Mr. Devin Combs has no past history of chest pain.  He was in his normal state of health until about 10:00 this morning when he developed substernal chest discomfort.  This was nonradiating and it was associated with diaphoresis.  There was no nausea, vomiting, lightheadedness, or shortness of breath.  The patient's pain intensified and he rated it as a 10/10 at its worst.  He chewed 2 aspirin and his pain improved slightly.  States that his pain has continued to improve since being started on IV heparin and IV nitroglycerin here in the hospital.  He has no past history of similar symptoms.  He has never had a cardiac catheterization and has no history of myocardial infarction.  He had stress testing in 2017 which showed no significant abnormality.  He has had normal LV function by both nuclear testing and echo studies in 2017.  The patient is a non-smoker.  He has a family history of coronary artery disease in his mother at age 21.  No premature CAD in the family.  At the time of my evaluation his chest discomfort is down to a 3/10 and he is much more comfortable.   Past Medical History:  Diagnosis Date  . Anxiety   . Arthritis   . Cancer (HCC)    basal and squamous cell carcinoma  . Chronic back pain   . DDD (degenerative disc disease), thoracolumbar   . Degenerative cervical spinal stenosis   . Depression   . Diabetic autonomic neuropathy ([place])   . Dyspnea    with exertion   . GERD (gastroesophageal  reflux disease)   . Headache   . History of bronchitis   . History of kidney stones   . Hyperlipidemia   . Hypertension   . Hypogonadism in male   . Myocardial infarction ([place])   . Neuropathy   . Orthostatic hypotension   . OSA (obstructive sleep apnea)    per pt study done 2006 (approx)  used cpap few yrs then stopped using, stated didn't feel he needed it anymore  . Psoriasis   . Retinal tear   . Right knee meniscal tear   . Type 2 diabetes mellitus ([place])   . Urinary hesitancy   . Vitamin D deficiency     Past Surgical History:  Procedure Laterality Date  . CATARACT EXTRACTION W/ INTRAOCULAR LENS  IMPLANT, BILATERAL  right 2015//  left 2012  . CORRECTION REPAIR MULTIPLE  TOES , LEFT FOOT  YRS AGO  . EXTRACORPOREAL SHOCK WAVE LITHOTRIPSY  2000 approx  . KNEE ARTHROSCOPY Left 2007  . KNEE ARTHROSCOPY Right 02/10/2015   Procedure: ARTHROSCOPY RIGHT KNEE WITH DEBRIDEMENT AND CHRONDROPLASTY;  Surgeon: Gaynelle Arabian, MD;  Location: [facility];  Service: Orthopedics;  Laterality: Right;  . PARTIAL KNEE ARTHROPLASTY Right 07/19/2016   Procedure: RIGHT KNEE MEDICAL UNICOMPARTMENTAL ARTHROPLASTY;  Surgeon: Gaynelle Arabian, MD;  Location: [facility];  Service: Orthopedics;  Laterality: Right;  . RADICAL ORCHIECTOMY Left 1996   benign  . REMOVAL SPURS RIGHT  GREAT TOE  YRS AGO  . TOTAL KNEE ARTHROPLASTY Left 09-16-2007     Medications Prior to Admission: Prior to Admission medications   Medication Sig Start Date End Date Taking? Authorizing Provider  Alpha-D-Galactosidase (BEANO PO) Take 1 tablet by mouth as needed.    [provider]  Apoaequorin (PREVAGEN) 10 MG CAPS Take 1 capsule by mouth daily.    [provider]  diclofenac (VOLTAREN) 50 MG EC tablet Take 50 mg by mouth 2 (two) times daily.    [provider]  DULoxetine (CYMBALTA) 60 MG capsule Take 60 mg by mouth every evening.    [provider]  fentaNYL (DURAGESIC - DOSED MCG/HR)  75 MCG/HR Place 75 mcg onto the skin every 3 (three) days.     [provider]  hydrochlorothiazide (MICROZIDE) 12.5 MG capsule TAKE 1 CAPSULE (12.5 MG TOTAL) EVERY MORNING AS NEEDED FOR A BLOOD PRESSURE GREATER THAN 180. 03/13/17   Deboraha Sprang, MD  HYDROcodone-acetaminophen [facility]) 10-325 MG tablet Take 1 tablet by mouth 3 (three) times daily.    [provider]  lisinopril (PRINIVIL,ZESTRIL) 40 MG tablet TAKE 1 TAB BY MOUTH EVERY EVENING. PATIENT NEEDS TO CALL AND SCHEDULE AN APPT FOR FURTHER REFILLS 10/08/17   Deboraha Sprang, MD  metFORMIN (GLUCOPHAGE) 1000 MG tablet Take 1,000 mg by mouth 2 (two) times daily with a meal.    [provider]  methocarbamol (ROBAXIN) 500 MG tablet Take 1 tablet (500 mg total) by mouth 4 (four) times daily. Patient taking differently: Take 500 mg by mouth daily as needed.  02/10/15   Gaynelle Arabian, MD  omeprazole (PRILOSEC) 20 MG capsule Take 20 mg by mouth every morning. 01/10/16   [provider]  oxymetazoline (AFRIN) 0.05 % nasal spray Place 1 spray into both nostrils 2 (two) times daily as needed for congestion.    [provider]  rosuvastatin (CRESTOR) 20 MG tablet Take 20 mg by mouth. Takes once weekly.    [provider]  valACYclovir (VALTREX) 1000 MG tablet Take 1,000 mg by mouth daily as needed (outbreaks).     [provider]     Allergies:   No Known Allergies  Social History:   Social History   Socioeconomic History  . Marital status: Married    Spouse name: Not on file  . Number of children: 1  . Years of education: Not on file  . Highest education level: Some college, no degree  Occupational History  . Not on file  Social Needs  . Financial resource strain: Not on file  . Food insecurity:    Worry: Not on file    Inability: Not on file  . Transportation needs:    Medical: Not on file    Non-medical: Not on file  Tobacco Use  . Smoking status: Never Smoker  .  Smokeless tobacco: Never Used  Substance and Sexual Activity  . Alcohol use: Yes    Comment: rare  . Drug use: No  . Sexual activity: Not on file  Lifestyle  . Physical activity:    Days per week: Not on file    Minutes per session: Not on file  . Stress: Not on file  Relationships  . Social connections:    Talks on phone: Not on file    Gets together: Not on file    Attends religious service: Not on file    Active member of club or organization: Not on file    Attends  meetings of clubs or organizations: Not on file    Relationship status: Not on file  . Intimate partner violence:    Fear of current or ex partner: Not on file    Emotionally abused: Not on file    Physically abused: Not on file    Forced sexual activity: Not on file  Other Topics Concern  . Not on file  Social History Narrative   Lives at home with wife, who is Therapist, sports.  Pt is retired.  Caffeine 2-3 cups per week. Right handed.     Family History:   The patient's family history is negative for Neuropathy.  Positive for CAD in his mother.  Temperature  ROS:  Please see the history of present illness.  Positive for chronic low back pain and joint pains all other ROS reviewed and negative.     Physical Exam/Data:   Vitals:   10/29/18 1621 10/29/18 1713 10/29/18 1725 10/29/18 1730  BP: (!) 168/104 (!) 154/94 (!) 168/96 (!) 164/106  Pulse: 100 85 90 93  Resp: 17 12 13  (!) 22  Temp: 98.2 F (36.8 C)     TempSrc: Oral     SpO2: 94% 95% 98% 96%  Weight: 117.9 kg     Height: 6\' 1"  (1.854 m)      No intake or output data in the 24 hours ending 10/29/18 1738 Last 3 Weights 10/29/2018 08/15/2018 05/23/2018  Weight (lbs) 260 lb 263 lb 12.8 oz 267 lb 9.6 oz  Weight (kg) 117.935 kg 119.659 kg 121.383 kg     Body mass index is 34.3 kg/m.  General:  Well nourished, well developed, obese male in no acute distress HEENT: normal Lymph: no adenopathy Neck: no JVD Endocrine:  No thryomegaly Vascular: No carotid bruits;  FA pulses 2+ bilaterally without bruits  Cardiac:  normal S1, S2; RRR; no murmur  Lungs:  clear to auscultation bilaterally, no wheezing, rhonchi or rales  Abd: soft, nontender, no hepatomegaly  Ext: no edema Musculoskeletal:  No deformities, BUE and BLE strength normal and equal Skin: warm and dry  Neuro:  CNs 2-12 intact, no focal abnormalities noted Psych:  Normal affect    EKG:  The ECG that was done was personally reviewed and demonstrates normal sinus rhythm with right bundle branch block. LAFB  Relevant CV Studies: Echo 02/16/2016: Study Conclusions  - Left ventricle: The cavity size was normal. Systolic function was   normal. The estimated ejection fraction was in the range of 55%   to 60%. Wall motion was normal; there were no regional wall   motion abnormalities. Features are consistent with a pseudonormal   left ventricular filling pattern, with concomitant abnormal   relaxation and increased filling pressure (grade 2 diastolic   dysfunction). - Left atrium: The atrium was moderately dilated. - Pulmonary arteries: Systolic pressure was mildly increased. PA   peak pressure: 37 mm Hg (S).  Nuclear Stress Test 02/17/2016: Study Highlights    Nuclear stress EF: 61%.  There was no ST segment deviation noted during stress.  The study is normal.  This is a low risk study.  The left ventricular ejection fraction is normal (55-65%).   Normal resting and stress perfusion. No ischemia or infarction EF 61%   Laboratory Data: Chemistry Recent Labs  Lab 10/29/18 1605  NA 139  K 4.3  CL 101  CO2 26  GLUCOSE 188*  BUN 23  CREATININE 1.16  CALCIUM 9.9  GFRNONAA >60  GFRAA >60  ANIONGAP 12  No results for input(s): PROT, ALBUMIN, AST, ALT, ALKPHOS, BILITOT in the last 168 hours. Hematology Recent Labs  Lab 10/29/18 1605  WBC 9.6  RBC 4.94  HGB 15.0  HCT 45.2  MCV 91.5  MCH 30.4  MCHC 33.2  RDW 13.1  PLT 283   Cardiac EnzymesNo results for input(s):  TROPONINI in the last 168 hours.  Recent Labs  Lab 10/29/18 1620  TROPIPOC 0.54*    BNPNo results for input(s): BNP, PROBNP in the last 168 hours.  DDimer No results for input(s): DDIMER in the last 168 hours.  Radiology/Studies:  Dg Chest 2 View  Result Date: 10/29/2018 CLINICAL DATA:  Central chest pain beginning this morning. EXAM: CHEST - 2 VIEW COMPARISON:  PA and lateral chest 10/25/2006. FINDINGS: The lungs are clear. Eventration of the right hemidiaphragm is unchanged. No pneumothorax or pleural effusion. No acute or focal bony abnormality. IMPRESSION: No acute disease.  Stable compared to prior exam. Electronically Signed   By: Inge Rise M.D.   On: 10/29/2018 16:35    Assessment and Plan:   1. Non-STEMI 2.  Type 2 diabetes 3.  Mixed hyperlipidemia treated with rosuvastatin 20 mg daily 4.  Chronic pain syndrome 5.  Hypertension  We will continue aspirin, a high intensity statin drug, IV nitroglycerin, and IV heparin.  Will add metoprolol for beta-blockade.  Cardiac catheterization and possible PCI are indicated. I have reviewed the risks, indications, and alternatives to cardiac catheterization, possible angioplasty, and stenting with the patient. Risks include but are not limited to bleeding, infection, vascular injury, stroke, myocardial infection, arrhythmia, kidney injury, radiation-related injury in the case of prolonged fluoroscopy use, emergency cardiac surgery, and death. The patient understands the risks of serious complication is 1-2 in 2482 with diagnostic cardiac cath and 1-2% or less with angioplasty/stenting.  We will cycle cardiac enzymes.  We will plan to proceed with cardiac catheterization tomorrow unless his symptoms worsen or he develops an indication for acute catheterization and PCI.  We reviewed the importance of lifestyle modification with weight loss, diet, and exercise.  We will check his lipids in the morning.  Will cover his glycemic control with  sliding scale insulin while he is hospitalized and will hold metformin.  For questions or updates, please contact [facility] Please consult [email or web address] for contact info under   Signed, Sherren Mocha, MD  10/29/2018 5:38 PM

## 2018-10-29 NOTE — ED Notes (Signed)
Family at bedside. 

## 2018-10-29 NOTE — ED Notes (Signed)
Attempted to give report. Rn unable at this time but will call back

## 2018-10-29 NOTE — ED Triage Notes (Signed)
Pt c/o central chest pains that started earlier this morning. Took 2 ASA earlier. Pt reports that his BP was high at home today as well.

## 2018-10-29 NOTE — ED Provider Notes (Signed)
[facility] DEPT Provider Note   CSN: [id number] Arrival date & time: 10/29/18  1602    History   Chief Complaint Chief Complaint  Patient presents with  . Chest Pain    HPI Azari Hasler is a 68 y.o. male.     68 year old male presents with acute onset of substernal chest pressure which began this morning when he woke up.  Pain was 4 out of 10 when he first woke up and then became 10 out of 10 throughout the day.  Pain did not radiate to his back or up to his neck.  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  No fever cough or congestion.  Does have a history of MI in the past but this does not feel similar.  Told his wife and he was given aspirin and sent here.     Past Medical History:  Diagnosis Date  . Anxiety   . Arthritis   . Cancer (HCC)    basal and squamous cell carcinoma  . Chronic back pain   . DDD (degenerative disc disease), thoracolumbar   . Degenerative cervical spinal stenosis   . Depression   . Diabetic autonomic neuropathy ([place])   . Dyspnea    with exertion   . GERD (gastroesophageal reflux disease)   . Headache   . History of bronchitis   . History of kidney stones   . Hyperlipidemia   . Hypertension   . Hypogonadism in male   . Myocardial infarction ([place])   . Neuropathy   . Orthostatic hypotension   . OSA (obstructive sleep apnea)    per pt study done 2006 (approx)  used cpap few yrs then stopped using, stated didn't feel he needed it anymore  . Psoriasis   . Retinal tear   . Right knee meniscal tear   . Type 2 diabetes mellitus ([place])   . Urinary hesitancy   . Vitamin D deficiency     Patient Active Problem List   Diagnosis Date Noted  . Complex sleep apnea syndrome 07/10/2017  . Treatment-emergent central sleep apnea 07/10/2017  . Myocardial infarction ([place]) 07/10/2017  . Excessive daytime sleepiness 05/31/2017  . Snoring 05/31/2017  . Coronary artery disease involving  native coronary artery of native heart without angina pectoris 05/31/2017  . Intermittent palpitations 05/31/2017  . Perspiration-excessive 05/31/2017  . 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 OA (osteoarthritis) of knee 07/19/2016  . Acute medial meniscal tear 02/10/2015    Past Surgical History:  Procedure Laterality Date  . CATARACT EXTRACTION W/ INTRAOCULAR LENS  IMPLANT, BILATERAL  right 2015//  left 2012  . CORRECTION REPAIR MULTIPLE  TOES , LEFT FOOT  YRS AGO  . EXTRACORPOREAL SHOCK WAVE LITHOTRIPSY  2000 approx  . KNEE ARTHROSCOPY Left 2007  . KNEE ARTHROSCOPY Right 02/10/2015   Procedure: ARTHROSCOPY RIGHT KNEE WITH DEBRIDEMENT AND CHRONDROPLASTY;  Surgeon: Gaynelle Arabian, MD;  Location: [facility];  Service: Orthopedics;  Laterality: Right;  . PARTIAL KNEE ARTHROPLASTY Right 07/19/2016   Procedure: RIGHT KNEE MEDICAL UNICOMPARTMENTAL ARTHROPLASTY;  Surgeon: Gaynelle Arabian, MD;  Location: [facility];  Service: Orthopedics;  Laterality: Right;  . RADICAL ORCHIECTOMY Left 1996   benign  . REMOVAL SPURS RIGHT GREAT TOE  YRS AGO  . TOTAL KNEE ARTHROPLASTY Left 09-16-2007        Home Medications    Prior to Admission medications   Medication Sig Start Date End Date Taking? Authorizing Provider  Alpha-D-Galactosidase (  BEANO PO) Take 1 tablet by mouth as needed.    [provider]  Apoaequorin (PREVAGEN) 10 MG CAPS Take 1 capsule by mouth daily.    [provider]  diclofenac (VOLTAREN) 50 MG EC tablet Take 50 mg by mouth 2 (two) times daily.    [provider]  DULoxetine (CYMBALTA) 60 MG capsule Take 60 mg by mouth every evening.    [provider]  fentaNYL (DURAGESIC - DOSED MCG/HR) 75 MCG/HR Place 75 mcg onto the skin every 3 (three) days.     [provider]  hydrochlorothiazide (MICROZIDE) 12.5 MG capsule TAKE 1 CAPSULE (12.5 MG TOTAL) EVERY MORNING AS NEEDED FOR A BLOOD PRESSURE GREATER THAN  180. 03/13/17   Deboraha Sprang, MD  HYDROcodone-acetaminophen [facility]) 10-325 MG tablet Take 1 tablet by mouth 3 (three) times daily.    [provider]  lisinopril (PRINIVIL,ZESTRIL) 40 MG tablet TAKE 1 TAB BY MOUTH EVERY EVENING. PATIENT NEEDS TO CALL AND SCHEDULE AN APPT FOR FURTHER REFILLS 10/08/17   Deboraha Sprang, MD  metFORMIN (GLUCOPHAGE) 1000 MG tablet Take 1,000 mg by mouth 2 (two) times daily with a meal.    [provider]  methocarbamol (ROBAXIN) 500 MG tablet Take 1 tablet (500 mg total) by mouth 4 (four) times daily. Patient taking differently: Take 500 mg by mouth daily as needed.  02/10/15   Gaynelle Arabian, MD  omeprazole (PRILOSEC) 20 MG capsule Take 20 mg by mouth every morning. 01/10/16   [provider]  oxymetazoline (AFRIN) 0.05 % nasal spray Place 1 spray into both nostrils 2 (two) times daily as needed for congestion.    [provider]  rosuvastatin (CRESTOR) 20 MG tablet Take 20 mg by mouth. Takes once weekly.    [provider]  valACYclovir (VALTREX) 1000 MG tablet Take 1,000 mg by mouth daily as needed (outbreaks).     [provider]    Family History Family History  Problem Relation Age of Onset  . Neuropathy Neg Hx     Social History Social History   Tobacco Use  . Smoking status: Never Smoker  . Smokeless tobacco: Never Used  Substance Use Topics  . Alcohol use: Yes    Comment: rare  . Drug use: No     Allergies   Patient has no known allergies.   Review of Systems Review of Systems  All other systems reviewed and are negative.    Physical Exam Updated Vital Signs BP (!) 168/104 (BP Location: Right Arm)   Pulse 100   Temp 98.2 F (36.8 C) (Oral)   Resp 17   Ht 1.854 m (6\' 1" )   Wt 117.9 kg   SpO2 94%   BMI 34.30 kg/m   Physical Exam Vitals signs and nursing note reviewed.  Constitutional:      General: He is not in acute distress.    Appearance: Normal appearance. He is  well-developed. He is not toxic-appearing.  HENT:     Head: Normocephalic and atraumatic.  Eyes:     General: Lids are normal.     Conjunctiva/sclera: Conjunctivae normal.     Pupils: Pupils are equal, round, and reactive to light.  Neck:     Musculoskeletal: Normal range of motion and neck supple.     Thyroid: No thyroid mass.     Trachea: No tracheal deviation.  Cardiovascular:     Rate and Rhythm: Normal rate and regular rhythm.     Heart sounds: Normal  heart sounds. No murmur. No gallop.   Pulmonary:     Effort: Pulmonary effort is normal. No respiratory distress.     Breath sounds: Normal breath sounds. No stridor. No decreased breath sounds, wheezing, rhonchi or rales.  Abdominal:     General: Bowel sounds are normal. There is no distension.     Palpations: Abdomen is soft.     Tenderness: There is no abdominal tenderness. There is no rebound.  Musculoskeletal: Normal range of motion.        General: No tenderness.  Skin:    General: Skin is warm and dry.     Findings: No abrasion or rash.  Neurological:     Mental Status: He is alert and oriented to person, place, and time.     GCS: GCS eye subscore is 4. GCS verbal subscore is 5. GCS motor subscore is 6.     Cranial Nerves: No cranial nerve deficit.     Sensory: No sensory deficit.  Psychiatric:        Speech: Speech normal.        Behavior: Behavior normal.      ED Treatments / Results  Labs (all labs ordered are listed, but only abnormal results are displayed) Labs Reviewed  BASIC METABOLIC PANEL - Abnormal; Notable for the following components:      Result Value   Glucose, Bld 188 (*)    All other components within normal limits  POCT I-STAT TROPONIN I - Abnormal; Notable for the following components:   Troponin i, poc 0.54 (*)    All other components within normal limits  CBC  I-STAT TROPONIN, ED    EKG EKG Interpretation  Date/Time:  Tuesday October 29 2018 16:13:50 EDT Ventricular Rate:  89 PR  Interval:    QRS Duration: 138 QT Interval:  376 QTC Calculation: 458 R Axis:   -67 Text Interpretation:  Sinus rhythm Short PR interval Right bundle branch block Anterolateral infarct, age indeterminate Baseline wander in lead(s) V1 V6 Confirmed by Lacretia Leigh ([id number]) on 10/29/2018 4:39:55 PM   Radiology Dg Chest 2 View  Result Date: 10/29/2018 CLINICAL DATA:  Central chest pain beginning this morning. EXAM: CHEST - 2 VIEW COMPARISON:  PA and lateral chest 10/25/2006. FINDINGS: The lungs are clear. Eventration of the right hemidiaphragm is unchanged. No pneumothorax or pleural effusion. No acute or focal bony abnormality. IMPRESSION: No acute disease.  Stable compared to prior exam. Electronically Signed   By: Inge Rise M.D.   On: 10/29/2018 16:35    Procedures Procedures (including critical care time)  Medications Ordered in ED Medications  nitroGLYCERIN 50 mg in dextrose 5 % 250 mL (0.2 mg/mL) infusion (has no administration in time range)     Initial Impression / Assessment and Plan / ED Course  I have reviewed the triage vital signs and the nursing notes.  Pertinent labs & imaging results that were available during my care of the patient were reviewed by me and considered in my medical decision making (see chart for details).        Patient has new right bundle branch block on EKG.  Has elevated troponin at 0.54.  Continues to endorse chest discomfort.  Patient replaced on nitroglycerin drip as well as heparin per pharmacy.  Will consult cardiology for admission  Final Clinical Impressions(s) / ED Diagnoses   Final diagnoses:  None    ED Discharge Orders    None       Lacretia Leigh, MD 10/29/18  1703  

## 2018-10-29 NOTE — ED Notes (Signed)
EDP at bedside  

## 2018-10-30 ENCOUNTER — Inpatient Hospital Stay (HOSPITAL_COMMUNITY): Payer: Medicare Other

## 2018-10-30 ENCOUNTER — Encounter (HOSPITAL_COMMUNITY)
Admission: EM | Disposition: A | Discharge status: Home / Self Care | Payer: Self-pay | Source: Home / Self Care | Attending: Cardiovascular Disease

## 2018-10-30 ENCOUNTER — Other Ambulatory Visit: Payer: Self-pay

## 2018-10-30 DIAGNOSIS — E1140 Type 2 diabetes mellitus with diabetic neuropathy, unspecified: Secondary | ICD-10-CM | POA: Diagnosis not present

## 2018-10-30 DIAGNOSIS — E785 Hyperlipidemia, unspecified: Secondary | ICD-10-CM

## 2018-10-30 DIAGNOSIS — E119 Type 2 diabetes mellitus without complications: Secondary | ICD-10-CM

## 2018-10-30 DIAGNOSIS — E1142 Type 2 diabetes mellitus with diabetic polyneuropathy: Secondary | ICD-10-CM | POA: Diagnosis not present

## 2018-10-30 DIAGNOSIS — I2510 Atherosclerotic heart disease of native coronary artery without angina pectoris: Secondary | ICD-10-CM

## 2018-10-30 DIAGNOSIS — E1165 Type 2 diabetes mellitus with hyperglycemia: Secondary | ICD-10-CM | POA: Diagnosis not present

## 2018-10-30 DIAGNOSIS — F324 Major depressive disorder, single episode, in partial remission: Secondary | ICD-10-CM | POA: Diagnosis not present

## 2018-10-30 DIAGNOSIS — G9340 Encephalopathy, unspecified: Secondary | ICD-10-CM

## 2018-10-30 DIAGNOSIS — E1141 Type 2 diabetes mellitus with diabetic mononeuropathy: Secondary | ICD-10-CM | POA: Diagnosis not present

## 2018-10-30 DIAGNOSIS — I10 Essential (primary) hypertension: Secondary | ICD-10-CM | POA: Diagnosis not present

## 2018-10-30 DIAGNOSIS — I214 Non-ST elevation (NSTEMI) myocardial infarction: Secondary | ICD-10-CM

## 2018-10-30 HISTORY — PX: LEFT HEART CATH AND CORONARY ANGIOGRAPHY: CATH118249

## 2018-10-30 HISTORY — PX: CORONARY/GRAFT ACUTE MI REVASCULARIZATION: CATH118305

## 2018-10-30 LAB — BASIC METABOLIC PANEL
Anion gap: 8 (ref 5–15)
BUN: 20 mg/dL (ref 8–23)
CHLORIDE: 101 mmol/L (ref 98–111)
CO2: 27 mmol/L (ref 22–32)
Calcium: 9.2 mg/dL (ref 8.9–10.3)
Creatinine, Ser: 1.19 mg/dL (ref 0.61–1.24)
GFR calc Af Amer: >60 mL/min (ref >60)
GFR calc non Af Amer: >60 mL/min (ref >60)
Glucose, Bld: 159 mg/dL — ABNORMAL HIGH (ref 70–99)
Potassium: 4 mmol/L (ref 3.5–5.1)
Sodium: 136 mmol/L (ref 135–145)

## 2018-10-30 LAB — CBC
HCT: 38 % — ABNORMAL LOW (ref 39.0–52.0)
HEMATOCRIT: 37.1 % — AB (ref 39.0–52.0)
HEMOGLOBIN: 12.5 g/dL — AB (ref 13.0–17.0)
Hemoglobin: 12.6 g/dL — ABNORMAL LOW (ref 13.0–17.0)
MCH: 30.1 pg (ref 26.0–34.0)
MCH: 29.7 pg (ref 26.0–34.0)
MCHC: 33.7 g/dL (ref 30.0–36.0)
MCHC: 33.2 g/dL (ref 30.0–36.0)
MCV: 89.4 fL (ref 80.0–100.0)
MCV: 89.6 fL (ref 80.0–100.0)
PLATELETS: 236 10*3/uL (ref 150–400)
Platelets: 235 10*3/uL (ref 150–400)
RBC: 4.15 MIL/uL — ABNORMAL LOW (ref 4.22–5.81)
RBC: 4.24 MIL/uL (ref 4.22–5.81)
RDW: 13.1 % (ref 11.5–15.5)
RDW: 13.2 % (ref 11.5–15.5)
WBC: 11.2 10*3/uL — ABNORMAL HIGH (ref 4.0–10.5)
WBC: 11.8 10*3/uL — AB (ref 4.0–10.5)
nRBC: 0 % (ref 0.0–0.2)
nRBC: 0 % (ref 0.0–0.2)

## 2018-10-30 LAB — LIPID PANEL
Cholesterol: 216 mg/dL — ABNORMAL HIGH (ref 0–200)
HDL: 36 mg/dL — ABNORMAL LOW (ref >40)
LDL Cholesterol: 153 mg/dL — ABNORMAL HIGH (ref 0–99)
Total CHOL/HDL Ratio: 6 RATIO
Triglycerides: 133 mg/dL (ref <150)
VLDL: 27 mg/dL (ref 0–40)

## 2018-10-30 LAB — ECHOCARDIOGRAM COMPLETE
Height: 73 in
Weight: 4313.6 oz

## 2018-10-30 LAB — TROPONIN I
TROPONIN I: 4.14 ng/mL — AB (ref <0.03)
Troponin I: 57.34 ng/mL (ref <0.03)

## 2018-10-30 LAB — HEPARIN LEVEL (UNFRACTIONATED): Heparin Unfractionated: 0.19 IU/mL — ABNORMAL LOW (ref 0.30–0.70)

## 2018-10-30 LAB — GLUCOSE, CAPILLARY
Glucose-Capillary: 157 mg/dL — ABNORMAL HIGH (ref 70–99)
Glucose-Capillary: 139 mg/dL — ABNORMAL HIGH (ref 70–99)
Glucose-Capillary: 180 mg/dL — ABNORMAL HIGH (ref 70–99)
Glucose-Capillary: 140 mg/dL — ABNORMAL HIGH (ref 70–99)

## 2018-10-30 LAB — POCT ACTIVATED CLOTTING TIME: Activated Clotting Time: 411 seconds

## 2018-10-30 LAB — HIV ANTIBODY (ROUTINE TESTING W REFLEX): HIV SCREEN 4TH GENERATION: NONREACTIVE

## 2018-10-30 LAB — MRSA PCR SCREENING: MRSA by PCR: NEGATIVE

## 2018-10-30 SURGERY — LEFT HEART CATH AND CORONARY ANGIOGRAPHY
Anesthesia: LOCAL

## 2018-10-30 MED ORDER — LABETALOL HCL 5 MG/ML IV SOLN: 10.0000 mg | INTRAVENOUS | Status: AC | PRN

## 2018-10-30 MED ORDER — FENTANYL CITRATE (PF) 100 MCG/2ML IJ SOLN
INTRAMUSCULAR | Status: DC | PRN
  Administered 2018-10-30 (×2): 25 ug via INTRAVENOUS

## 2018-10-30 MED ORDER — MIDAZOLAM HCL 2 MG/2ML IJ SOLN
INTRAMUSCULAR | Status: AC
  Filled 2018-10-30: qty 2

## 2018-10-30 MED ORDER — TIROFIBAN HCL IN NACL 5-0.9 MG/100ML-% IV SOLN
0.1500 ug/kg/min | INTRAVENOUS | Status: DC
  Administered 2018-10-30 – 2018-10-31 (×3): 0.15 ug/kg/min via INTRAVENOUS
  Filled 2018-10-30 (×3): qty 100

## 2018-10-30 MED ORDER — HEPARIN (PORCINE) IN NACL 1000-0.9 UT/500ML-% IV SOLN
INTRAVENOUS | Status: AC
  Filled 2018-10-30: qty 500

## 2018-10-30 MED ORDER — VERAPAMIL HCL 2.5 MG/ML IV SOLN
INTRAVENOUS | Status: DC | PRN
  Administered 2018-10-30: 10 mL via INTRA_ARTERIAL

## 2018-10-30 MED ORDER — HEPARIN SODIUM (PORCINE) 1000 UNIT/ML IJ SOLN
INTRAMUSCULAR | Status: DC | PRN
  Administered 2018-10-30: 6000 [IU] via INTRAVENOUS

## 2018-10-30 MED ORDER — TICAGRELOR 90 MG PO TABS
ORAL_TABLET | ORAL | Status: AC
  Filled 2018-10-30: qty 2

## 2018-10-30 MED ORDER — ONDANSETRON HCL 4 MG/2ML IJ SOLN: 4.0000 mg | Freq: Four times a day (QID) | INTRAMUSCULAR | Status: DC | PRN

## 2018-10-30 MED ORDER — MORPHINE SULFATE (PF) 4 MG/ML IV SOLN
4.0000 mg | Freq: Once | INTRAVENOUS | Status: AC
  Administered 2018-10-30: 4 mg via INTRAVENOUS
  Filled 2018-10-30: qty 1

## 2018-10-30 MED ORDER — ATORVASTATIN CALCIUM 80 MG PO TABS
80.0000 mg | ORAL_TABLET | Freq: Every day | ORAL | Status: DC
  Administered 2018-10-30 – 2018-10-31 (×2): 80 mg via ORAL
  Filled 2018-10-30 (×2): qty 1

## 2018-10-30 MED ORDER — TICAGRELOR 90 MG PO TABS
90.0000 mg | ORAL_TABLET | Freq: Two times a day (BID) | ORAL | Status: DC
  Administered 2018-10-30 – 2018-11-01 (×4): 90 mg via ORAL
  Filled 2018-10-30 (×4): qty 1

## 2018-10-30 MED ORDER — MIDAZOLAM HCL 2 MG/2ML IJ SOLN
INTRAMUSCULAR | Status: DC | PRN
  Administered 2018-10-30: 2 mg via INTRAVENOUS
  Administered 2018-10-30: 1 mg via INTRAVENOUS

## 2018-10-30 MED ORDER — SODIUM CHLORIDE 0.9 % IV SOLN
INTRAVENOUS | Status: DC
  Administered 2018-10-30 – 2018-10-31 (×3): via INTRAVENOUS

## 2018-10-30 MED ORDER — SODIUM CHLORIDE 0.9 % IV SOLN
INTRAVENOUS | Status: AC | PRN
  Administered 2018-10-30: 1.75 mg/kg/h via INTRAVENOUS
  Administered 2018-10-30: 1.75 mg/kg/h

## 2018-10-30 MED ORDER — TIROFIBAN HCL IN NACL 5-0.9 MG/100ML-% IV SOLN
INTRAVENOUS | Status: AC
  Filled 2018-10-30: qty 100

## 2018-10-30 MED ORDER — LIDOCAINE HCL (PF) 1 % IJ SOLN
INTRAMUSCULAR | Status: DC | PRN
  Administered 2018-10-30: 2 mL

## 2018-10-30 MED ORDER — HEPARIN BOLUS VIA INFUSION
3000.0000 [IU] | Freq: Once | INTRAVENOUS | Status: AC
  Administered 2018-10-30: 3000 [IU] via INTRAVENOUS
  Filled 2018-10-30: qty 3000

## 2018-10-30 MED ORDER — SODIUM CHLORIDE 0.9% FLUSH: 3.0000 mL | INTRAVENOUS | Status: DC | PRN

## 2018-10-30 MED ORDER — HYDRALAZINE HCL 20 MG/ML IJ SOLN: 5.0000 mg | INTRAMUSCULAR | Status: AC | PRN

## 2018-10-30 MED ORDER — FENTANYL CITRATE (PF) 100 MCG/2ML IJ SOLN
INTRAMUSCULAR | Status: AC
  Filled 2018-10-30: qty 2

## 2018-10-30 MED ORDER — NITROGLYCERIN IN D5W 200-5 MCG/ML-% IV SOLN: 0.0000 ug/min | INTRAVENOUS | Status: DC

## 2018-10-30 MED ORDER — ACETAMINOPHEN 325 MG PO TABS: 650.0000 mg | ORAL_TABLET | ORAL | Status: DC | PRN

## 2018-10-30 MED ORDER — BIVALIRUDIN TRIFLUOROACETATE 250 MG IV SOLR
INTRAVENOUS | Status: AC
  Filled 2018-10-30: qty 250

## 2018-10-30 MED ORDER — VERAPAMIL HCL 2.5 MG/ML IV SOLN
INTRAVENOUS | Status: AC
  Filled 2018-10-30: qty 2

## 2018-10-30 MED ORDER — IOHEXOL 350 MG/ML SOLN
INTRAVENOUS | Status: DC | PRN
  Administered 2018-10-30: 385 mL via INTRA_ARTERIAL

## 2018-10-30 MED ORDER — LIDOCAINE HCL (PF) 1 % IJ SOLN
INTRAMUSCULAR | Status: AC
  Filled 2018-10-30: qty 30

## 2018-10-30 MED ORDER — NITROGLYCERIN 1 MG/10 ML FOR IR/CATH LAB
INTRA_ARTERIAL | Status: DC | PRN
  Administered 2018-10-30 (×5): 100 ug via INTRACORONARY

## 2018-10-30 MED ORDER — SODIUM CHLORIDE 0.9 % IV SOLN: 250.0000 mL | INTRAVENOUS | Status: DC | PRN

## 2018-10-30 MED ORDER — BIVALIRUDIN BOLUS VIA INFUSION - CUPID
INTRAVENOUS | Status: DC | PRN
  Administered 2018-10-30: 91.725 mg via INTRAVENOUS

## 2018-10-30 MED ORDER — ACETAMINOPHEN 500 MG PO TABS
1000.0000 mg | ORAL_TABLET | Freq: Four times a day (QID) | ORAL | Status: DC | PRN
  Administered 2018-10-30: 1000 mg via ORAL
  Filled 2018-10-30: qty 2

## 2018-10-30 MED ORDER — ASPIRIN 81 MG PO CHEW: 81.0000 mg | CHEWABLE_TABLET | Freq: Every day | ORAL | Status: DC

## 2018-10-30 MED ORDER — TICAGRELOR 90 MG PO TABS
ORAL_TABLET | ORAL | Status: DC | PRN
  Administered 2018-10-30: 180 mg via ORAL

## 2018-10-30 MED ORDER — TIROFIBAN (AGGRASTAT) BOLUS VIA INFUSION
INTRAVENOUS | Status: DC | PRN
  Administered 2018-10-30: 3057.5 ug via INTRAVENOUS

## 2018-10-30 MED ORDER — PERFLUTREN LIPID MICROSPHERE
INTRAVENOUS | Status: AC
  Administered 2018-10-30: 2 mL via INTRAVENOUS
  Filled 2018-10-30: qty 10

## 2018-10-30 MED ORDER — PERFLUTREN LIPID MICROSPHERE
1.0000 mL | INTRAVENOUS | Status: AC | PRN
  Administered 2018-10-30: 2 mL via INTRAVENOUS
  Administered 2018-10-30: 3 mL via INTRAVENOUS
  Filled 2018-10-30: qty 10

## 2018-10-30 MED ORDER — SODIUM CHLORIDE 0.9% FLUSH
3.0000 mL | Freq: Two times a day (BID) | INTRAVENOUS | Status: DC
  Administered 2018-10-30 – 2018-11-01 (×3): 3 mL via INTRAVENOUS

## 2018-10-30 MED ORDER — HEPARIN (PORCINE) IN NACL 1000-0.9 UT/500ML-% IV SOLN
INTRAVENOUS | Status: DC | PRN
  Administered 2018-10-30 (×2): 500 mL

## 2018-10-30 SURGICAL SUPPLY — 25 items
BALLN SAPPHIRE 2.0X12 (BALLOONS) ×2
BALLN SAPPHIRE 2.5X15 (BALLOONS) ×2
BALLN SAPPHIRE ~~LOC~~ 2.5X15 (BALLOONS) ×2 IMPLANT
BALLN ~~LOC~~ EMERGE MR 3.25X20 (BALLOONS) ×2
BALLOON SAPPHIRE 2.0X12 (BALLOONS) ×1 IMPLANT
BALLOON SAPPHIRE 2.5X15 (BALLOONS) ×1 IMPLANT
BALLOON ~~LOC~~ EMERGE MR 3.25X20 (BALLOONS) ×1 IMPLANT
CATH INFINITI 5FR ANG PIGTAIL (CATHETERS) ×2 IMPLANT
CATH INFINITI 5FR JL4 (CATHETERS) IMPLANT
CATH INFINITI JR4 5F (CATHETERS) ×2 IMPLANT
CATH OPTITORQUE TIG 4.0 5F (CATHETERS) ×2 IMPLANT
CATH VISTA GUIDE 6FR XBLAD3.5 (CATHETERS) ×2 IMPLANT
DEVICE RAD COMP TR BAND LRG (VASCULAR PRODUCTS) ×2 IMPLANT
GLIDESHEATH SLEND SS 6F .021 (SHEATH) ×2 IMPLANT
GUIDEWIRE INQWIRE 1.5J.035X260 (WIRE) ×1 IMPLANT
INQWIRE 1.5J .035X260CM (WIRE) ×2
KIT ENCORE 26 ADVANTAGE (KITS) ×2 IMPLANT
KIT HEART LEFT (KITS) ×2 IMPLANT
PACK CARDIAC CATHETERIZATION (CUSTOM PROCEDURE TRAY) ×2 IMPLANT
STENT RESOLUTE ONYX 2.5X22 (Permanent Stent) ×2 IMPLANT
STENT RESOLUTE ONYX3.0X38 (Permanent Stent) ×2 IMPLANT
SYR MEDRAD MARK 7 150ML (SYRINGE) ×2 IMPLANT
TRANSDUCER W/STOPCOCK (MISCELLANEOUS) ×2 IMPLANT
TUBING CIL FLEX 10 FLL-RA (TUBING) ×2 IMPLANT
WIRE PT2 MS 185 (WIRE) ×2 IMPLANT

## 2018-10-30 NOTE — Progress Notes (Signed)
Troponin 4.14- Cardiology MD notified

## 2018-10-30 NOTE — Consult Note (Addendum)
Neurology Consultation Reason for Consult: Confusion Referring Physician: Ezzie Dural (code stroke)  CC: Confusion  History is obtained from: Wife, patient  HPI: Devin Combs is a 68 y.o. male admitted for NSTEMI who underwent cardiac catheterization with drug-eluting stents placed today.  Following the procedure, he has been acting out of character, for instance when the nurse was leaning over him he poked at the ID badge that was dangling saying "boop Boop boop." he also was told not to move his arm after the procedure, and in response he flipped the bird which his wife states is uncharacteristic of him.  Then around 6 PM he was given a Norco for pain and shortly before 7 it was noted that he was sitting up on the side of the bed trying to pull off his leads.  Because of worsening symptoms, a code stroke was activated.  Discussing with his wife, there has been some concern for his short-term memory and she states that it has been more noticeable over the past 6 months.  He also had an MRI while evaluating headaches which demonstrated moderate atrophy.  His wife notes that they have not slept even a week since last night.   LKW: Unclear given mild possible symptoms prior to his worsening, likely last night tPA given: No, anticoagulation    ROS: Unable to obtain due to altered mental status.   Past Medical History:  Diagnosis Date  . Anxiety   . Arthritis   . Cancer (HCC)    basal and squamous cell carcinoma  . Chronic back pain   . DDD (degenerative disc disease), thoracolumbar   . Degenerative cervical spinal stenosis   . Depression   . Diabetic autonomic neuropathy ([place])   . Dyspnea    with exertion   . GERD (gastroesophageal reflux disease)   . Headache   . History of bronchitis   . History of kidney stones   . Hyperlipidemia   . Hypertension   . Hypogonadism in male   . Myocardial infarction ([place])   . Neuropathy   . Orthostatic hypotension   . OSA (obstructive  sleep apnea)    per pt study done 2006 (approx)  used cpap few yrs then stopped using, stated didn't feel he needed it anymore  . Psoriasis   . Retinal tear   . Right knee meniscal tear   . Type 2 diabetes mellitus ([place])   . Urinary hesitancy   . Vitamin D deficiency      Family History  Problem Relation Age of Onset  . Neuropathy Neg Hx      Social History:  reports that he has never smoked. He has never used smokeless tobacco. He reports current alcohol use. He reports that he does not use drugs.   Exam: Current vital signs: BP 116/70   Pulse 76   Temp 97.8 F (36.6 C) (Oral)   Resp (!) 21   Ht 6\' 1"  (1.854 m)   Wt 122.3 kg   SpO2 95%   BMI 35.57 kg/m  Vital signs in last 24 hours: Temp:  [97.6 F (36.4 C)-98.6 F (37 C)] 97.8 F (36.6 C) (03/18 1636) Pulse Rate:  [70-93] 76 (03/18 1500) Resp:  [12-22] 21 (03/18 1500) BP: (104-128)/(61-84) 116/70 (03/18 1500) SpO2:  [0 %-99 %] 95 % (03/18 1500) Weight:  [121.4 kg-122.3 kg] 122.3 kg (03/18 0555)   Physical Exam  Constitutional: Appears well-developed and well-nourished.  Psych: Affect appropriate to situation Eyes: No scleral injection HENT: No  OP obstrucion Head: Normocephalic.  Cardiovascular: Normal rate and regular rhythm.  Respiratory: Effort normal, non-labored breathing GI: Soft.  No distension. There is no tenderness.  Skin: WDI  Neuro: Mental Status: Patient is awake, alert, oriented to person, place, month, year, and situation. Patient is able to give a clear and coherent history. No signs of aphasia or neglect Cranial Nerves: II: Visual Fields are full. Pupils are equal, round, and reactive to light.   III,IV, VI: EOMI without ptosis or diploplia.  V: Facial sensation is symmetric to temperature VII: Facial movement is symmetric.  VIII: hearing is intact to voice X: Uvula elevates symmetrically XI: Shoulder shrug is symmetric. XII: tongue is midline without atrophy or fasciculations.   Motor: Tone is normal. Bulk is normal. 5/5 strength was present in all four extremities.  Sensory: Sensation is symmetric to light touch and temperature in the arms and legs. Cerebellar: FNF and HKS are intact bilaterally   I have reviewed labs in epic and the results pertinent to this consultation are: BMP-unremarkable  I have reviewed the images obtained: CT head-unremarkable  Impression: 68 year old male with history of possible mild memory problems with acute encephalopathy consistent with a mild delirium in the setting of lack of sleep and  Recommendations: 1) initially recommended CT head to rule out hemorrhage, this is negative 2) TSH, B12 3) if he improves, I do not feel any further imaging is needed. 4) hold sedating medications, could use low-dose neuroleptic if needed.  Roland Rack, MD [facility] [phone]  If 7pm- 7am, please page neurology on call as listed in [place].    Roland Rack, MD [facility] [phone]  If 7pm- 7am, please page neurology on call as listed in [place].

## 2018-10-30 NOTE — Code Documentation (Signed)
Code Stroke called at 1844 by bedside RN because patient disorient and not answering questions appropriately. Per RN patient able to answer orientation question correctly at 1630.  Then about 1830 he was only oriented x1.  Per his wife he has not been his normal self since the procedure.  She states he was his "normal self" at 0815 this am.   BP 134/68  HR 88  RR 20  O2 sat 95% on RA. Dr Leonel Ramsay at bedside to assess patient.  Placed pt flat transported to radiology for head CT.  NIHSS 0 .  Patient is warm and clammy.  He denies CP, SOB or nausea at this time.  Returned to room.  Code Stroke canceled per Dr Leonel Ramsay.

## 2018-10-30 NOTE — Progress Notes (Signed)
  Echocardiogram 2D Echocardiogram has been performed.  Jennette Dubin 10/30/2018, 2:40 PM

## 2018-10-30 NOTE — Progress Notes (Signed)
[place] for tirofiban (Aggrastat) Indication: chest pain/ACS  No Known Allergies  Patient Measurements: Height: 6\' 1"  (185.4 cm) Weight: 269 lb 9.6 oz (122.3 kg) IBW/kg (Calculated) : 79.9 Heparin Dosing Weight: 105 kg  Vital Signs: Temp: 97.6 F (36.4 C) (03/18 0555) BP: 118/61 (03/18 1215) Pulse Rate: 70 (03/18 1215)  Labs: Recent Labs    10/29/18 1605 10/29/18 2057 10/29/18 2321 10/30/18 0257  HGB 15.0  --   --  12.5*  HCT 45.2  --   --  37.1*  PLT 283  --   --  235  HEPARINUNFRC  --   --  0.19*  --   CREATININE 1.16  --   --  1.19  TROPONINI  --  1.04*  --  4.14*    Estimated Creatinine Clearance: 82.6 mL/min (by C-G formula based on SCr of 1.19 mg/dL).   Assessment: Patient is a 68 y.o M presented to the ED on 3/17 with c/o CP and with elevated troponin.    Underwent cardiac cath today. Plan to continue tirofiban for 18 hours post-cath. Hgb 12.5, plt 235. Trop 0.54>1.04>4.14.   Goal of Therapy:  Heparin level 0.3-0.7 units/ml Monitor platelets by anticoagulation protocol: Yes   Plan:  Continue tirofiban at 0.15 mcg/kg/min for 18 hours total post-cath Daily CBC and monitor for s/sx of bleeding  Antonietta Jewel, PharmD, BCCCP Clinical Pharmacist  Pager: [phone] Phone: [phone] **Pharmacist phone directory can now be found on [email or web address] (PW [email or web address]).  Listed under [place]. 10/30/2018,12:51 PM

## 2018-10-30 NOTE — Progress Notes (Addendum)
Progress Note  Patient Name: Devin Combs Date of Encounter: 10/30/2018  Primary Cardiologist: No primary care provider on file.   Subjective   Dull chest pain overnight.   Inpatient Medications    Scheduled Meds: . [MAR Hold] aspirin EC  81 mg Oral Daily  . [MAR Hold] buPROPion  300 mg Oral Daily  . [MAR Hold] DULoxetine  60 mg Oral QPM  . [MAR Hold] fentaNYL  1 patch Transdermal Q72H  . [MAR Hold] HYDROcodone-acetaminophen  1 tablet Oral QID  . [MAR Hold] insulin aspart  0-15 Units Subcutaneous TID WC  . [MAR Hold] lisinopril  40 mg Oral QPM  . [MAR Hold] metoprolol tartrate  12.5 mg Oral BID  . [MAR Hold] rosuvastatin  20 mg Oral Q Wed  . sodium chloride flush  3 mL Intravenous Q12H   Continuous Infusions: . sodium chloride    . sodium chloride    . heparin 1,750 Units/hr (10/30/18 0553)  . nitroGLYCERIN 50 mcg/min (10/30/18 0116)   PRN Meds: sodium chloride, [MAR Hold] acetaminophen, [MAR Hold] alum & mag hydroxide-simeth, [MAR Hold] nitroGLYCERIN, sodium chloride flush   Vital Signs    Vitals:   10/29/18 2157 10/29/18 2200 10/29/18 2328 10/30/18 0555  BP: 128/84 128/84 121/69 127/77  Pulse:  87 87 84  Resp:  15    Temp:    97.6 F (36.4 C)  TempSrc:      SpO2:  95% 95% 94%  Weight:    122.3 kg  Height:        Intake/Output Summary (Last 24 hours) at 10/30/2018 8295 Last data filed at 10/30/2018 0116 Gross per 24 hour  Intake 233.78 ml  Output 280 ml  Net -46.22 ml   Last 3 Weights 10/30/2018 10/29/2018 10/29/2018  Weight (lbs) 269 lb 9.6 oz 267 lb 11.2 oz 260 lb  Weight (kg) 122.29 kg 121.428 kg 117.935 kg      Telemetry    SR - Personally Reviewed  ECG    SR with RBBB - Personally Reviewed  Physical Exam   GEN: No acute distress.   Neck: No JVD Cardiac: RRR, no murmurs, rubs, or gallops.  Respiratory: Clear to auscultation bilaterally. GI: Soft, nontender, non-distended  MS: No edema; No deformity. Neuro:  Nonfocal  Psych: Normal  affect   Labs    Chemistry Recent Labs  Lab 10/29/18 1605 10/30/18 0257  NA 139 136  K 4.3 4.0  CL 101 101  CO2 26 27  GLUCOSE 188* 159*  BUN 23 20  CREATININE 1.16 1.19  CALCIUM 9.9 9.2  GFRNONAA >60 >60  GFRAA >60 >60  ANIONGAP 12 8     Hematology Recent Labs  Lab 10/29/18 1605 10/30/18 0257  WBC 9.6 11.2*  RBC 4.94 4.15*  HGB 15.0 12.5*  HCT 45.2 37.1*  MCV 91.5 89.4  MCH 30.4 30.1  MCHC 33.2 33.7  RDW 13.1 13.1  PLT 283 235    Cardiac Enzymes Recent Labs  Lab 10/29/18 2057 10/30/18 0257  TROPONINI 1.04* 4.14*    Recent Labs  Lab 10/29/18 1620  TROPIPOC 0.54*     BNPNo results for input(s): BNP, PROBNP in the last 168 hours.   DDimer No results for input(s): DDIMER in the last 168 hours.   Radiology    Dg Chest 2 View  Result Date: 10/29/2018 CLINICAL DATA:  Central chest pain beginning this morning. EXAM: CHEST - 2 VIEW COMPARISON:  PA and lateral chest 10/25/2006. FINDINGS: The lungs are clear.  Eventration of the right hemidiaphragm is unchanged. No pneumothorax or pleural effusion. No acute or focal bony abnormality. IMPRESSION: No acute disease.  Stable compared to prior exam. Electronically Signed   By: Inge Rise M.D.   On: 10/29/2018 16:35    Cardiac Studies   N/a   Patient Profile     68 y.o. male with PMH of orthostatic hypotension, DM with autonomic neuropathy who presented with chest pain. Found to have an NSTEMI.   Assessment & Plan    1. NSTEMI: Troponin up to 4.14 this morning. Dull chest aching overnight with headache. Remains on IV heparin and nitro. Plan for cardiac cath this morning.  -- ASA, statin, BB  2. DM: On several diabetic agents prior to arrival. Will need stop Actos given heart diease.  -- SSI while in patient.  -- Hgb A1c in the am  3. HL: on crestor 10mg  at home, increased on admission. LDL 153. Consider increasing to 40mg  post cath.  4. HTN: continue ACEi, BB added on admission. Blood pressure  controlled.   For questions or updates, please contact [place] Please consult [email or web address] for contact info under   Signed, Reino Bellis, NP  10/30/2018, 8:22 AM    Patient seen, examined. Available data reviewed. Agree with findings, assessment, and plan as outlined by Reino Bellis, NP.  The patient is independently interviewed and examined.  He is restless after his heart catheterization.  He denies recurrence of chest pain.  He is complaining of his chronic back pain.  His right radial site is clear.  Heart is regular rate and rhythm with no murmur gallop, lungs are clear, abdomen is soft, obese, nontender, extremities have no edema.  I have personally reviewed the patient's cardiac catheterization images.  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  There was no other high-grade obstructive coronary disease.  His current medical therapy will be continued without changes.  We will follow-up with him tomorrow morning.  He will be continued on Aggrastat for 18 hours.  An echocardiogram is currently pending.  Sherren Mocha, M.D. 10/30/2018 3:23 PM

## 2018-10-30 NOTE — Interval H&P Note (Signed)
Cath Lab Visit (complete for each Cath Lab visit)  Clinical Evaluation Leading to the Procedure:   ACS: Yes.    Non-ACS:    Anginal Classification: CCS IV  Anti-ischemic medical therapy: Maximal Therapy (2 or more classes of medications)  Non-Invasive Test Results: No non-invasive testing performed  Prior CABG: No previous CABG      History and Physical Interval Note:  10/30/2018 8:29 AM  Devin Combs  has presented today for surgery, with the diagnosis of NSTEMI.  The various methods of treatment have been discussed with the patient and family. After consideration of risks, benefits and other options for treatment, the patient has consented to  Procedure(s): LEFT HEART CATH AND CORONARY ANGIOGRAPHY (N/A) as a surgical intervention.  The patient's history has been reviewed, patient examined, no change in status, stable for surgery.  I have reviewed the patient's chart and labs.  Questions were answered to the patient's satisfaction.     Shelva Majestic

## 2018-10-30 NOTE — Progress Notes (Signed)
Pt arrived on unit from [place]- report taken from Dale [place], RN

## 2018-10-30 NOTE — Progress Notes (Signed)
  Echocardiogram 2D Echocardiogram was attempted but the patient was in the Cath Lab.   Jennette Dubin 10/30/2018, 10:00 AM

## 2018-10-30 NOTE — Progress Notes (Signed)
[place] for heparin Indication: chest pain/ACS  No Known Allergies  Patient Measurements: Height: 6\' 1"  (185.4 cm) Weight: 267 lb 11.2 oz (121.4 kg) IBW/kg (Calculated) : 79.9 Heparin Dosing Weight: 105 kg  Vital Signs: Temp: 98.6 F (37 C) (03/17 2027) Temp Source: Oral (03/17 2027) BP: 121/69 (03/17 2328) Pulse Rate: 87 (03/17 2328)  Labs: Recent Labs    10/29/18 1605 10/29/18 2057 10/29/18 2321  HGB 15.0  --   --   HCT 45.2  --   --   PLT 283  --   --   HEPARINUNFRC  --   --  0.19*  CREATININE 1.16  --   --   TROPONINI  --  1.04*  --     Estimated Creatinine Clearance: 84.3 mL/min (by C-G formula based on SCr of 1.16 mg/dL).   Assessment: Patient is a 68 y.o M presented to the ED on 3/17 with c/o CP and with elevated troponin.  To start heparin drip for NSTEMI.  Heparin level subtherapeutic (0.19) on gtt at 1400 units/hr. No issues with line or bleeding reported per RN.  Goal of Therapy:  Heparin level 0.3-0.7 units/ml Monitor platelets by anticoagulation protocol: Yes   Plan:  Heparin bolus 3000 units and increase gtt to 1750 units/hr. Will f/u 6 hr heparin level Daily heparin level and CBC  Sherlon Handing, PharmD, BCPS Clinical pharmacist  **Pharmacist phone directory can now be found on [email or web address] ([email or web address]).  Listed under [place]. 10/30/2018,12:25 AM

## 2018-10-30 NOTE — Progress Notes (Addendum)
10/29/18 Pt stated at 2155 he had chest pressure/aching midsternal area 3/10 BP was 128/84. Nitro drip titrated up to 40 mcg   2300 pressure/aching increased to 5/10 titrated Nitro to 45 mcg BP 121/69  2326 pressure/aching/ with indigestion increased to 5/10- titrated to 50 mcg and administered Maalox  0100- pt stated he had severe headache 8/10 and pressure/pain was 4/10- Paged cardiology verbal order to give Morphine 4 mg IV   0130- Patient stated he felt better- pressure and headache has subsided.

## 2018-10-30 NOTE — Progress Notes (Signed)
Troponin 1.04- Cardiology MD notified

## 2018-10-31 ENCOUNTER — Encounter (HOSPITAL_COMMUNITY): Payer: Self-pay | Admitting: Cardiovascular Disease

## 2018-10-31 LAB — GLUCOSE, CAPILLARY
GLUCOSE-CAPILLARY: 152 mg/dL — AB (ref 70–99)
Glucose-Capillary: 186 mg/dL — ABNORMAL HIGH (ref 70–99)
Glucose-Capillary: 134 mg/dL — ABNORMAL HIGH (ref 70–99)
Glucose-Capillary: 117 mg/dL — ABNORMAL HIGH (ref 70–99)
Glucose-Capillary: 135 mg/dL — ABNORMAL HIGH (ref 70–99)

## 2018-10-31 LAB — CBC
HEMATOCRIT: 35.6 % — AB (ref 39.0–52.0)
Hemoglobin: 12.1 g/dL — ABNORMAL LOW (ref 13.0–17.0)
MCH: 30.4 pg (ref 26.0–34.0)
MCHC: 34 g/dL (ref 30.0–36.0)
MCV: 89.4 fL (ref 80.0–100.0)
Platelets: 238 10*3/uL (ref 150–400)
RBC: 3.98 MIL/uL — ABNORMAL LOW (ref 4.22–5.81)
RDW: 13.2 % (ref 11.5–15.5)
WBC: 11.2 10*3/uL — ABNORMAL HIGH (ref 4.0–10.5)
nRBC: 0 % (ref 0.0–0.2)

## 2018-10-31 LAB — HEPATIC FUNCTION PANEL
ALT: 39 U/L (ref 0–44)
AST: 65 U/L — AB (ref 15–41)
Albumin: 3.5 g/dL (ref 3.5–5.0)
Alkaline Phosphatase: 81 U/L (ref 38–126)
Bilirubin, Direct: 0.2 mg/dL (ref 0.0–0.2)
Indirect Bilirubin: 0.8 mg/dL (ref 0.3–0.9)
Total Bilirubin: 1 mg/dL (ref 0.3–1.2)
Total Protein: 6.3 g/dL — ABNORMAL LOW (ref 6.5–8.1)

## 2018-10-31 LAB — BASIC METABOLIC PANEL
Anion gap: 8 (ref 5–15)
BUN: 11 mg/dL (ref 8–23)
CO2: 24 mmol/L (ref 22–32)
Calcium: 9 mg/dL (ref 8.9–10.3)
Chloride: 104 mmol/L (ref 98–111)
Creatinine, Ser: 1.19 mg/dL (ref 0.61–1.24)
GFR calc Af Amer: >60 mL/min (ref >60)
GFR calc non Af Amer: >60 mL/min (ref >60)
Glucose, Bld: 225 mg/dL — ABNORMAL HIGH (ref 70–99)
Potassium: 3.6 mmol/L (ref 3.5–5.1)
Sodium: 136 mmol/L (ref 135–145)

## 2018-10-31 LAB — TSH: TSH: 1.022 u[IU]/mL (ref 0.350–4.500)

## 2018-10-31 LAB — VITAMIN B12: Vitamin B-12: 423 pg/mL (ref 180–914)

## 2018-10-31 LAB — HEMOGLOBIN A1C
Hgb A1c MFr Bld: 6.4 % — ABNORMAL HIGH (ref 4.8–5.6)
MEAN PLASMA GLUCOSE: 136.98 mg/dL

## 2018-10-31 LAB — HEPARIN LEVEL (UNFRACTIONATED): Heparin Unfractionated: <0.1 IU/mL — ABNORMAL LOW (ref 0.30–0.70)

## 2018-10-31 MED ORDER — CARVEDILOL 3.125 MG PO TABS
3.1250 mg | ORAL_TABLET | Freq: Two times a day (BID) | ORAL | Status: DC
  Administered 2018-10-31 – 2018-11-01 (×3): 3.125 mg via ORAL
  Filled 2018-10-31 (×3): qty 1

## 2018-10-31 MED ORDER — SPIRONOLACTONE 12.5 MG HALF TABLET
12.5000 mg | ORAL_TABLET | Freq: Every day | ORAL | Status: DC
  Administered 2018-10-31: 12.5 mg via ORAL
  Filled 2018-10-31 (×2): qty 1

## 2018-10-31 MED ORDER — LOSARTAN POTASSIUM 50 MG PO TABS
50.0000 mg | ORAL_TABLET | Freq: Every day | ORAL | Status: DC
  Administered 2018-10-31 – 2018-11-01 (×2): 50 mg via ORAL
  Filled 2018-10-31 (×2): qty 1

## 2018-10-31 NOTE — TOC Benefit Eligibility Note (Signed)
Transition of Care [facility]) Benefit Eligibility Note    Patient Details  Name: Devin Combs MRN: [id number] Date of Birth: [birth year]   Medication/Dose: Delene Loll 49-51  Covered?: Yes   Spoke with Person/Company/Phone Number:: [facility] [address]  Co-Pay: 30 day: $149.67/ 90 day: 394.87  Prior Approval: No    Delorse Lek Phone Number: 10/31/2018, 11:40 AM

## 2018-10-31 NOTE — Progress Notes (Signed)
Pt received from [place]. VSS. CHG complete. Telemetry applied. Pt and wife oriented to room and unit. Will continue to monitor.  Clyde Canterbury, RN

## 2018-10-31 NOTE — Care Management (Addendum)
Entresto 9/51 twice daily and Brilinta cost sent and pending.  Midge Minium RN, BSN, NCM-BC, ACM-RN [phone]

## 2018-10-31 NOTE — Progress Notes (Addendum)
Some restlessness overnight. He is awake, alert, interactive and appropriate this morning. Oriented to person, place, month, year, and situation.   Given wife reports of some short term memory difficulty and imaging with atrophy, I think that he likely has some predisposition to delirium and what we are seeing is a hospital acquired postprocedural delirium. I think that further imaging is low yield at this time.   Could use low dose seroquel 25mg  qhs starting tonight.   Only if he has persistent concern for behavioral changes would I get an MRI.   Also, will follow up TSH, B12.     If these are normal, then neurology will sign off.   Roland Rack, MD [facility] [phone]  If 7pm- 7am, please page neurology on call as listed in [place].

## 2018-10-31 NOTE — Progress Notes (Signed)
CARDIAC REHAB PHASE I   PRE:  Rate/Rhythm: 91 SR    BP: sitting 147/95    SaO2: 97 RA  MODE:  Ambulation: 250 ft   POST:  Rate/Rhythm: 100 ST with PVC    BP: sitting 182/89     SaO2: 97 RA  Pt able to get up independently and walk with RW. He struggles with back issues and orthostasis at home. He is supposed to use a RW when out of the house but he doesn't. He was fairly steady with RW however back started hurting after 200 ft and he returned to room. BP elevated. Began ed with pt and wife. Understand importance of Brilinta/ASA. Discussed MI, stents, restrictions, diet, and CRPII. Pt is interested in CRPII and I will send referral to [place]. Discussed low sodium diet however he has been on a higher sodium and fluid diet due to orthostasis.  Encouraged him to discuss with MD. [place], ACSM 10/31/2018 2:01 PM

## 2018-10-31 NOTE — TOC Benefit Eligibility Note (Signed)
Transition of Care [facility]) Benefit Eligibility Note    Patient Details  Name: Devin Combs MRN: [id number] Date of Birth: 08-13-1951   Medication/Dose: Kary Kos 90mg  BID  Covered?: Yes   Spoke with Person/Company/Phone Number:: [facility] [address]  Co-Pay: 30 day: $110.59  Prior Approval: No   [place] Phone Number: 10/31/2018, 11:55 AM

## 2018-10-31 NOTE — Plan of Care (Signed)
  Problem: Education: Goal: Knowledge of General Education information will improve Description Including pain rating scale, medication(s)/side effects and non-pharmacologic comfort measures Outcome: Progressing   Problem: Health Behavior/Discharge Planning: Goal: Ability to manage health-related needs will improve Outcome: Progressing   

## 2018-10-31 NOTE — Progress Notes (Signed)
Progress Note  Patient Name: Devin Combs Date of Encounter: 10/31/2018  Primary Cardiologist: No primary care provider on file.   Subjective   Episode of agitation/delirium noted last night.  Emergent neurology consultation obtained.  Appreciate management by Dr. Leonel Ramsay.  The patient feels better this morning.  He denies chest pain.  He does complain of shortness of breath with lying flat on his back.  Otherwise no complaints at present.  His wife is at the bedside.  Inpatient Medications    Scheduled Meds: . aspirin EC  81 mg Oral Daily  . atorvastatin  80 mg Oral q1800  . buPROPion  300 mg Oral Daily  . DULoxetine  60 mg Oral QPM  . fentaNYL  1 patch Transdermal Q72H  . HYDROcodone-acetaminophen  1 tablet Oral QID  . insulin aspart  0-15 Units Subcutaneous TID WC  . lisinopril  40 mg Oral QPM  . metoprolol tartrate  12.5 mg Oral BID  . sodium chloride flush  3 mL Intravenous Q12H  . ticagrelor  90 mg Oral BID   Continuous Infusions: . sodium chloride Stopped (10/31/18 0086)  . sodium chloride    . nitroGLYCERIN Stopped (10/30/18 1929)   PRN Meds: sodium chloride, acetaminophen, alum & mag hydroxide-simeth, nitroGLYCERIN, ondansetron (ZOFRAN) IV, sodium chloride flush   Vital Signs    Vitals:   10/31/18 0606 10/31/18 0700 10/31/18 0704 10/31/18 0748  BP: (!) 141/70 (!) 135/114 (!) 149/90   Pulse:   87   Resp: (!) 22 18 20    Temp:    98.7 F (37.1 C)  TempSrc:    Oral  SpO2:   95%   Weight:      Height:        Intake/Output Summary (Last 24 hours) at 10/31/2018 0830 Last data filed at 10/31/2018 0700 Gross per 24 hour  Intake 3211.83 ml  Output 2600 ml  Net 611.83 ml   Last 3 Weights 10/30/2018 10/29/2018 10/29/2018  Weight (lbs) 269 lb 9.6 oz 267 lb 11.2 oz 260 lb  Weight (kg) 122.29 kg 121.428 kg 117.935 kg      Telemetry    Normal sinus rhythm with occasional PVCs - Personally Reviewed  ECG    Sinus rhythm with left bundle branch block  and anterolateral ST elevation consistent with acute injury - Personally Reviewed  Physical Exam  Alert, oriented male in no distress GEN: No acute distress.   Neck:  JVP mildly elevated Cardiac: RRR, no murmurs, rubs, or gallops.  Respiratory: Clear to auscultation bilaterally. GI: Soft, nontender, non-distended, obese MS:  Trace bilateral pretibial edema; No deformity. Neuro:  Nonfocal  Psych: Normal affect   Labs    Chemistry Recent Labs  Lab 10/29/18 1605 10/30/18 0257  NA 139 136  K 4.3 4.0  CL 101 101  CO2 26 27  GLUCOSE 188* 159*  BUN 23 20  CREATININE 1.16 1.19  CALCIUM 9.9 9.2  GFRNONAA >60 >60  GFRAA >60 >60  ANIONGAP 12 8     Hematology Recent Labs  Lab 10/29/18 1605 10/30/18 0257 10/30/18 1652  WBC 9.6 11.2* 11.8*  RBC 4.94 4.15* 4.24  HGB 15.0 12.5* 12.6*  HCT 45.2 37.1* 38.0*  MCV 91.5 89.4 89.6  MCH 30.4 30.1 29.7  MCHC 33.2 33.7 33.2  RDW 13.1 13.1 13.2  PLT 283 235 236    Cardiac Enzymes Recent Labs  Lab 10/29/18 2057 10/30/18 0257 10/30/18 1249  TROPONINI 1.04* 4.14* 57.34*    Recent Labs  Lab 10/29/18 1620  TROPIPOC 0.54*     BNPNo results for input(s): BNP, PROBNP in the last 168 hours.   DDimer No results for input(s): DDIMER in the last 168 hours.   Radiology    Dg Chest 2 View  Result Date: 10/29/2018 CLINICAL DATA:  Central chest pain beginning this morning. EXAM: CHEST - 2 VIEW COMPARISON:  PA and lateral chest 10/25/2006. FINDINGS: The lungs are clear. Eventration of the right hemidiaphragm is unchanged. No pneumothorax or pleural effusion. No acute or focal bony abnormality. IMPRESSION: No acute disease.  Stable compared to prior exam. Electronically Signed   By: Inge Rise M.D.   On: 10/29/2018 16:35   Ct Head Code Stroke Wo Contrast  Result Date: 10/30/2018 CLINICAL DATA:  Code stroke. Code stroke. Specific symptoms not elucidated. EXAM: CT HEAD WITHOUT CONTRAST TECHNIQUE: Contiguous axial images were  obtained from the base of the skull through the vertex without intravenous contrast. COMPARISON:  None. FINDINGS: Brain: Generalized brain atrophy. No sign of acute infarction, mass lesion, hemorrhage, hydrocephalus or extra-axial collection. Vascular: 1 could question a hyperdense M2 segment on the left. This is not definite. Skull: Negative Sinuses/Orbits: Clear/normal Other: None ASPECTS ([place] Stroke Program Early CT Score) I was not given the side of symptoms. - Ganglionic level infarction (caudate, lentiform nuclei, internal capsule, insula, M1-M3 cortex): 7 - Supraganglionic infarction (M4-M6 cortex): 3 Total score (0-10 with 10 being normal): 10 IMPRESSION: 1. No acute parenchymal finding. 1 could question a hyperdense M2 vessel on the left. 2. ASPECTS is 10. 3. These results were communicated to Dr. Leonel Ramsay at [place] 3/18/2020by text page via the [facility] messaging system. Electronically Signed   By: Nelson Chimes M.D.   On: 10/30/2018 19:13    Cardiac Studies   Echocardiogram: IMPRESSIONS    1. Severe akinesis of the left ventricular, mid-apical anteroseptal wall.  2. The left ventricle has moderately reduced systolic function, with an ejection fraction of 35-40%. The cavity size was normal. Left ventricular diastolic Doppler parameters are consistent with pseudonormalization.  3. The right ventricle has normal systolic function. The cavity was normal. There is no increase in right ventricular wall thickness.  4. Left atrial size was mildly dilated.  5. The aortic valve is tricuspid Moderate thickening of the aortic valve Mild calcification of the aortic valve.  6. The interatrial septum was not assessed.  FINDINGS  Left Ventricle: The left ventricle has moderately reduced systolic function, with an ejection fraction of 35-40%. The cavity size was normal. There is no increase in left ventricular wall thickness. Left ventricular diastolic Doppler parameters are  consistent with  pseudonormalization. Severe akinesis of the left ventricular, mid-apical anteroseptal wall. Definity contrast agent was given IV to delineate the left ventricular endocardial borders. Right Ventricle: The right ventricle has normal systolic function. The cavity was normal. There is no increase in right ventricular wall thickness. Left Atrium: left atrial size was mildly dilated Right Atrium: right atrial size was normal in size. Right atrial pressure is estimated at 15 mmHg. Interatrial Septum: The interatrial septum was not assessed. Pericardium: There is no evidence of pericardial effusion. Mitral Valve: The mitral valve is normal in structure. Mitral valve regurgitation is not visualized by color flow Doppler. Tricuspid Valve: The tricuspid valve is normal in structure. Tricuspid valve regurgitation was not visualized by color flow Doppler. Aortic Valve: The aortic valve is tricuspid Moderate thickening of the aortic valve Mild calcification of the aortic valve. Aortic valve regurgitation was not visualized  by color flow Doppler. Pulmonic Valve: The pulmonic valve was grossly normal. Pulmonic valve regurgitation is not visualized by color flow Doppler.  Patient Profile     68 y.o. male presents with non-STEMI initially, then found to have total occlusion of the proximal LAD with significant anterior infarction, treated with PCI using 2 drug-eluting stents in the LAD  Assessment & Plan    1.  Acute anterior MI: The patient underwent PCI yesterday.  He has residual ST elevation.  LVEF 35 to 40% by echo assessment.  The patient appears hemodynamically stable.  Will initiate post MI medical therapy with DAPT using aspirin and ticagrelor, high intensity statin drug, and will start Spironolactone at low dose.  Will change his beta-blocker to carvedilol 3.125 mg twice daily.  Will discontinue lisinopril and start losartan with plans to ultimately convert him to [facility].  2.  Acute confusion: Improved  this morning.  He is alert and oriented at my evaluation.  Appreciate neurology evaluation last night.  Disposition: Transfer to cardiac telemetry bed today.  Anticipate discharge approximately 48 hours as he has had a sizable anterior infarction.  For questions or updates, please contact [place] Please consult [email or web address] for contact info under     Signed, Sherren Mocha, MD  10/31/2018, 8:30 AM

## 2018-10-31 NOTE — Progress Notes (Signed)
Nutrition Brief Note  Patient identified on the Malnutrition Screening Tool (MST) Report.  Spoke with pt and wife at bedside. Pt reviewing menu with plans to order lunch.  Pt reports that his appetite is improving and that he is feeling much better. Pt states that he ate almost all of his breakfast but didn't like the "muffin sandwich."  Pt reports having a great appetite at home, "probably too much appetite." Pt states that he eats 2 meals daily and snacks on fruit in between meals. Pt shares that he has been trying to lose weight by "cutting back" and has lost 40 lbs over the last year. Pt confirmed that weight loss was intentional in nature.  Wt Readings from Last 15 Encounters:  10/30/18 122.3 kg  08/15/18 119.7 kg  05/23/18 121.4 kg  04/16/18 120 kg  12/04/17 119.2 kg  09/03/17 117.5 kg  05/31/17 119.7 kg  05/02/17 120.1 kg  09/21/16 123 kg  07/20/16 128.4 kg  07/13/16 128.4 kg  02/16/16 131.1 kg  01/25/16 131.3 kg  02/10/15 132.2 kg    Body mass index is 35.57 kg/m. Patient meets criteria for obesity class II based on current BMI.   Current diet order is Carb Modified, patient is consuming approximately 100% of meals at this time. Labs and medications reviewed.   No nutrition interventions warranted at this time. If nutrition issues arise, please consult RD.    Gaynell Face, MS, RD, LDN Inpatient Clinical Dietitian Pager: [phone] Weekend/After Hours: [phone]

## 2018-11-01 ENCOUNTER — Encounter (HOSPITAL_COMMUNITY): Payer: Self-pay | Admitting: Cardiology

## 2018-11-01 DIAGNOSIS — I5021 Acute systolic (congestive) heart failure: Secondary | ICD-10-CM

## 2018-11-01 DIAGNOSIS — I10 Essential (primary) hypertension: Secondary | ICD-10-CM

## 2018-11-01 DIAGNOSIS — E785 Hyperlipidemia, unspecified: Secondary | ICD-10-CM

## 2018-11-01 LAB — BASIC METABOLIC PANEL
Anion gap: 12 (ref 5–15)
BUN: 11 mg/dL (ref 8–23)
CO2: 22 mmol/L (ref 22–32)
Calcium: 9.3 mg/dL (ref 8.9–10.3)
Chloride: 102 mmol/L (ref 98–111)
Creatinine, Ser: 1.11 mg/dL (ref 0.61–1.24)
GFR calc non Af Amer: >60 mL/min (ref >60)
Glucose, Bld: 172 mg/dL — ABNORMAL HIGH (ref 70–99)
Potassium: 3.4 mmol/L — ABNORMAL LOW (ref 3.5–5.1)
Sodium: 136 mmol/L (ref 135–145)

## 2018-11-01 LAB — GLUCOSE, CAPILLARY
Glucose-Capillary: 155 mg/dL — ABNORMAL HIGH (ref 70–99)
Glucose-Capillary: 216 mg/dL — ABNORMAL HIGH (ref 70–99)
Glucose-Capillary: 201 mg/dL — ABNORMAL HIGH (ref 70–99)

## 2018-11-01 MED ORDER — POTASSIUM CHLORIDE CRYS ER 20 MEQ PO TBCR
40.0000 meq | EXTENDED_RELEASE_TABLET | Freq: Once | ORAL | Status: AC
  Administered 2018-11-01: 40 meq via ORAL
  Filled 2018-11-01: qty 2

## 2018-11-01 MED ORDER — ASPIRIN 81 MG PO TBEC: 81.0000 mg | DELAYED_RELEASE_TABLET | Freq: Every day | ORAL | 1 refills | Status: AC

## 2018-11-01 MED ORDER — CARVEDILOL 6.25 MG PO TABS: 6.2500 mg | ORAL_TABLET | Freq: Two times a day (BID) | ORAL | 2 refills | Status: DC

## 2018-11-01 MED ORDER — NITROGLYCERIN 0.4 MG SL SUBL: 0.4000 mg | SUBLINGUAL_TABLET | SUBLINGUAL | 2 refills | Status: AC | PRN

## 2018-11-01 MED ORDER — SPIRONOLACTONE 25 MG PO TABS: 25.0000 mg | ORAL_TABLET | Freq: Every day | ORAL | Status: DC

## 2018-11-01 MED ORDER — CARVEDILOL 6.25 MG PO TABS: 6.2500 mg | ORAL_TABLET | Freq: Two times a day (BID) | ORAL | Status: DC

## 2018-11-01 MED ORDER — SPIRONOLACTONE 25 MG PO TABS: 25.0000 mg | ORAL_TABLET | Freq: Every day | ORAL | 2 refills | Status: DC

## 2018-11-01 MED ORDER — LOSARTAN POTASSIUM 50 MG PO TABS: 50.0000 mg | ORAL_TABLET | Freq: Every day | ORAL | 1 refills | Status: DC

## 2018-11-01 MED ORDER — ATORVASTATIN CALCIUM 80 MG PO TABS: 80.0000 mg | ORAL_TABLET | Freq: Every day | ORAL | 1 refills | Status: DC

## 2018-11-01 MED ORDER — TICAGRELOR 90 MG PO TABS: 90.0000 mg | ORAL_TABLET | Freq: Two times a day (BID) | ORAL | 2 refills | Status: DC

## 2018-11-01 MED FILL — SPIRONOLACTONE 25 MG TABLET: 25 | 30 days supply | Qty: 30 | Fill #0 | Status: TO

## 2018-11-01 MED FILL — NITROGLYCERIN 0.4 MG TAB SL: 0.4 | 8 days supply | Qty: 25 | Fill #0 | Status: TO

## 2018-11-01 MED FILL — CARVEDILOL 6.25 MG TABLET: 6.25 | 30 days supply | Qty: 60 | Fill #0 | Status: TO

## 2018-11-01 MED FILL — ASPIRIN LOW DOSE 81 MG TBEC: 81 | 30 days supply | Qty: 30 | Fill #0 | Status: TO

## 2018-11-01 MED FILL — LOSARTAN POTASSIUM 50 MG TA: 50 | 90 days supply | Qty: 90 | Fill #0 | Status: TO

## 2018-11-01 MED FILL — BRILINTA 90 MG TABLET: 90 | 30 days supply | Qty: 60 | Fill #0 | Status: TO

## 2018-11-01 MED FILL — ATORVASTATIN CALCIUM 80 MG: 80 | 90 days supply | Qty: 90 | Fill #0 | Status: TO

## 2018-11-01 NOTE — Care Management Important Message (Signed)
Important Message  Patient Details  Name: Devin Combs MRN: [id number] Date of Birth: 1951-01-06   Medicare Important Message Given:  Yes    Orbie Pyo 11/01/2018, 3:32 PM

## 2018-11-01 NOTE — Care Management Important Message (Signed)
Important Message  Patient Details  Name: Devin Combs MRN: [id number] Date of Birth: 1951/05/09   Medicare Important Message Given:  Yes CORRECTION: PLEASE DISREGARD NOTE INCORRECTLY PLACE   Shaterica Mcclatchy 11/01/2018, 3:38 PM

## 2018-11-01 NOTE — Progress Notes (Signed)
CARDIAC REHAB PHASE I   PRE:  Rate/Rhythm: 87 sinus rhythm  BP:  Supine:   Sitting: 137/84  Standing:    SaO2: 98% RA   MODE:  Ambulation: 400 ft   POST:  Rate/Rhythem: 100 sinus tach, freq PVC  BP:  Supine:   Sitting: 156/85  Standing:    SaO2: 95% RA   1328-1414 Pt ambulated in hallway x1 assist using rolling walker.  Pt c/o moderate dyspnea and chronic back pain. Pt took 1 standing rest break. Pt coached in pursed lip breathing technique. Education completed including low fat-low cholesterol diet, exercise, and NTG use.  Pt instructed in importance of small frequent exercise such as walking to improve deconditioning and help offset back pain.  Pt states he has rolling walker at home.  Pt advised would be beneficial to use.    Pt oriented to outpatient cardiac rehab.  At pt request, referral will be sent to [place].  Pt and significant other verbalized understanding.    [employer]

## 2018-11-01 NOTE — Progress Notes (Addendum)
Progress Note  Patient Name: Devin Combs Date of Encounter: 11/01/2018  Primary Cardiologist: No primary care provider on file.   Subjective   Laying in bed this morning. Had a better night.   Inpatient Medications    Scheduled Meds:  aspirin EC  81 mg Oral Daily   atorvastatin  80 mg Oral q1800   buPROPion  300 mg Oral Daily   carvedilol  3.125 mg Oral BID WC   DULoxetine  60 mg Oral QPM   fentaNYL  1 patch Transdermal Q72H   HYDROcodone-acetaminophen  1 tablet Oral QID   insulin aspart  0-15 Units Subcutaneous TID WC   losartan  50 mg Oral Daily   sodium chloride flush  3 mL Intravenous Q12H   spironolactone  12.5 mg Oral QHS   ticagrelor  90 mg Oral BID   Continuous Infusions:  sodium chloride Stopped (10/31/18 1237)   sodium chloride     nitroGLYCERIN Stopped (10/30/18 1929)   PRN Meds: sodium chloride, acetaminophen, alum & mag hydroxide-simeth, nitroGLYCERIN, ondansetron (ZOFRAN) IV, sodium chloride flush   Vital Signs    Vitals:   10/31/18 1754 10/31/18 1920 11/01/18 0532 11/01/18 0906  BP: (!) 155/85 (!) 152/83 (!) 162/96 (!) 159/63  Pulse: (!) 108  92   Resp:  (!) 37 19   Temp:  98.1 F (36.7 C) 98.4 F (36.9 C)   TempSrc:  Oral Oral   SpO2:  99% 99%   Weight:      Height:        Intake/Output Summary (Last 24 hours) at 11/01/2018 1033 Last data filed at 11/01/2018 0848 Gross per 24 hour  Intake 345.42 ml  Output --  Net 345.42 ml   Last 3 Weights 10/30/2018 10/29/2018 10/29/2018  Weight (lbs) 269 lb 9.6 oz 267 lb 11.2 oz 260 lb  Weight (kg) 122.29 kg 121.428 kg 117.935 kg      Telemetry    ST - Personally Reviewed  ECG    N/a - Personally Reviewed  Physical Exam   GEN: No acute distress.   Neck: No JVD Cardiac: RRR, no murmurs, rubs, or gallops.  Respiratory: Clear to auscultation bilaterally. GI: Soft, nontender, non-distended  MS: No edema; No deformity. Right radial site stable.  Neuro:  Nonfocal  Psych:  Normal affect   Labs    Chemistry Recent Labs  Lab 10/30/18 0257 10/31/18 0819 11/01/18 0336  NA 136 136 136  K 4.0 3.6 3.4*  CL 101 104 102  CO2 27 24 22   GLUCOSE 159* 225* 172*  BUN 20 11 11   CREATININE 1.19 1.19 1.11  CALCIUM 9.2 9.0 9.3  PROT  --  6.3*  --   ALBUMIN  --  3.5  --   AST  --  65*  --   ALT  --  39  --   ALKPHOS  --  81  --   BILITOT  --  1.0  --   GFRNONAA >60 >60 >60  GFRAA >60 >60 >60  ANIONGAP 8 8 12      Hematology Recent Labs  Lab 10/30/18 0257 10/30/18 1652 10/31/18 0819  WBC 11.2* 11.8* 11.2*  RBC 4.15* 4.24 3.98*  HGB 12.5* 12.6* 12.1*  HCT 37.1* 38.0* 35.6*  MCV 89.4 89.6 89.4  MCH 30.1 29.7 30.4  MCHC 33.7 33.2 34.0  RDW 13.1 13.2 13.2  PLT 235 236 238    Cardiac Enzymes Recent Labs  Lab 10/29/18 2057 10/30/18 0257 10/30/18 1249  TROPONINI  1.04* 4.14* 57.34*    Recent Labs  Lab 10/29/18 1620  TROPIPOC 0.54*     BNPNo results for input(s): BNP, PROBNP in the last 168 hours.   DDimer No results for input(s): DDIMER in the last 168 hours.   Radiology    Ct Head Code Stroke Wo Contrast  Result Date: 10/30/2018 CLINICAL DATA:  Code stroke. Code stroke. Specific symptoms not elucidated. EXAM: CT HEAD WITHOUT CONTRAST TECHNIQUE: Contiguous axial images were obtained from the base of the skull through the vertex without intravenous contrast. COMPARISON:  None. FINDINGS: Brain: Generalized brain atrophy. No sign of acute infarction, mass lesion, hemorrhage, hydrocephalus or extra-axial collection. Vascular: 1 could question a hyperdense M2 segment on the left. This is not definite. Skull: Negative Sinuses/Orbits: Clear/normal Other: None ASPECTS ([place] Stroke Program Early CT Score) I was not given the side of symptoms. - Ganglionic level infarction (caudate, lentiform nuclei, internal capsule, insula, M1-M3 cortex): 7 - Supraganglionic infarction (M4-M6 cortex): 3 Total score (0-10 with 10 being normal): 10 IMPRESSION: 1. No  acute parenchymal finding. 1 could question a hyperdense M2 vessel on the left. 2. ASPECTS is 10. 3. These results were communicated to Dr. Leonel Ramsay at [place] 3/18/2020by text page via the [facility] messaging system. Electronically Signed   By: Nelson Chimes M.D.   On: 10/30/2018 19:13    Cardiac Studies   Cath: 10/30/2018   1st Diag lesion is 70% stenosed.  Prox LAD lesion is 100% stenosed.  Prox Cx lesion is 10% stenosed.  Dist Cx lesion is 20% stenosed.  Prox RCA to Mid RCA lesion is 10% stenosed.  Post Atrio lesion is 50% stenosed.  Mid LAD lesion is 95% stenosed.  Post intervention, there is a 0% residual stenosis.  A drug-eluting stent was successfully placed.  Post intervention, there is a 0% residual stenosis.  LV end diastolic pressure is mildly elevated.  There is mild to moderate left ventricular systolic dysfunction.  A stent was successfully placed.   Acute coronary syndrome secondary to total occlusion of the very proximal LAD immediately after a takeoff of a very proximal, near ostial diagonal vessel with initial TIMI 0 flow.  Mild concomitant CAD with 10% mid and 20% distal circumflex stenoses and 10% mid RCA stenosis with 50% distal focal narrowing in the continuation branch prior to the PLA takeoff.  There were minimal collaterals to the LAD.  Mild to moderate acute LV dysfunction with an EF of 40 to 45% and hypocontractility involving the mid distal anterolateral wall and apex.  LVEDP 20 mmHg.  Difficult but successful percutaneous coronary intervention of the very proximal LAD occlusion with ultimate insertion of a 3.0 x 38 mm Resolute Onyx DES stent at the site of very proximal total occlusion and insertion of a 2.5 x 22 mm Resolute stent in the mid LAD with the 95% stenosis being reduced to 0%.  There was initially no flow phenomena.  The patient was treated with Aggrastat infusion in addition to bivalirudin and Brilinta and received numerous doses of  intracoronary nitroglycerin.  RECOMMENDATION: DAPT for minimum of 1 year.  We will cycle troponins.  Will admit the patient to to heart following his catheterization.  Optimal blood pressure control with ideal blood pressure less than 120/80.  Aggressive high potency lipid lowering therapy with target LDL less than 70.    Echo: 10/30/2018  IMPRESSIONS    1. Severe akinesis of the left ventricular, mid-apical anteroseptal wall.  2. The left ventricle has moderately  reduced systolic function, with an ejection fraction of 35-40%. The cavity size was normal. Left ventricular diastolic Doppler parameters are consistent with pseudonormalization.  3. The right ventricle has normal systolic function. The cavity was normal. There is no increase in right ventricular wall thickness.  4. Left atrial size was mildly dilated.  5. The aortic valve is tricuspid Moderate thickening of the aortic valve Mild calcification of the aortic valve.  6. The interatrial septum was not assessed.  Patient Profile     68 y.o. male with PMH of HTN, HL, DM, GERD who presented with with non-STEMI initially, then found to have total occlusion of the proximal LAD with significant anterior infarction, treated with PCI using 2 drug-eluting stents in the LAD.   Assessment & Plan    1. Acute anterior MI: Underwent cardiac cath with PCI/DESx1 to the pLAD. EKG with residual ST elevation. On DAPT with ASA. Troponin 57.34. On BB, Losartan, and Spiro. No chest pain overnight but with some heaviness per wife. Brilinta check notes $100 for 30 day supply will plan for 30 card at discharge. May need to continue with Losartan for now given high cost of Entresto.  -- further titrate BB to 6.25mg  BID today  2. Acute systolic HF: EF noted at 97%. No volume overload on exam. Continue with BB, ARB and spiro  3. HL: on high dose statin, LDL 153  4. HTN: blood pressures elevated today. Increase BB as above  5. Hypokalemia: supplement     6. DM: Hgb A1c 6.4, on SSI while inpatient   For questions or updates, please contact [place] Please consult [email or web address] for contact info under   Signed, Reino Bellis, NP  11/01/2018, 10:33 AM    Patient seen, examined. Available data reviewed. Agree with findings, assessment, and plan as outlined by Reino Bellis, NP.  The patient is independently interviewed and examined.  He is now sitting up at the bedside and feeling much better after sleeping for a few hours this morning.  Lungs are clear, JVP is normal, heart is regular rate and rhythm with no murmur gallop, abdomen is soft, obese, nontender, extremities have no edema.  Reviewed the patient's medical program.  Recommend continuation of aspirin and ticagrelor for 30 days.  I would anticipate changing him to clopidogrel at 30 days because of cost issues.  Recommend continuing losartan as Delene Loll is too expensive for him.  He should have a follow-up echocardiogram in about 12 weeks.  I think the patient is stable for discharge today.  I am going to ask cardiac rehab to come back and see him and make sure that he is doing well with activity.  We had extensive discussion about post MI care today.  Otherwise plans as outlined above.  Sherren Mocha, M.D. 11/01/2018 11:52 AM

## 2018-11-01 NOTE — Discharge Summary (Signed)
Discharge Summary    Patient ID: Devin Combs,  MRN: [id number], DOB/AGE: August 09, 1951 68 y.o.  Admit date: 10/29/2018 Discharge date: 11/01/2018  Primary Care Provider: Maury Dus Primary Cardiologist: Dr. Burt Knack   Discharge Diagnoses    Active Problems:   NSTEMI (non-ST elevated myocardial infarction) [facility])   Hypertension   Hyperlipidemia   Acute systolic heart failure ([place])   Allergies No Known Allergies  Diagnostic Studies/Procedures    Cath: 10/30/2018   1st Diag lesion is 70% stenosed.  Prox LAD lesion is 100% stenosed.  Prox Cx lesion is 10% stenosed.  Dist Cx lesion is 20% stenosed.  Prox RCA to Mid RCA lesion is 10% stenosed.  Post Atrio lesion is 50% stenosed.  Mid LAD lesion is 95% stenosed.  Post intervention, there is a 0% residual stenosis.  A drug-eluting stent was successfully placed.  Post intervention, there is a 0% residual stenosis.  LV end diastolic pressure is mildly elevated.  There is mild to moderate left ventricular systolic dysfunction.  A stent was successfully placed.   Acute coronary syndrome secondary to total occlusion of the very proximal LAD immediately after a takeoff of a very proximal, near ostial diagonal vessel with initial TIMI 0 flow.  Mild concomitant CAD with 10% mid and 20% distal circumflex stenoses and 10% mid RCA stenosis with 50% distal focal narrowing in the continuation branch prior to the PLA takeoff.  There were minimal collaterals to the LAD.  Mild to moderate acute LV dysfunction with an EF of 40 to 45% and hypocontractility involving the mid distal anterolateral wall and apex.  LVEDP 20 mmHg.  Difficult but successful percutaneous coronary intervention of the very proximal LAD occlusion with ultimate insertion of a 3.0 x 38 mm Resolute Onyx DES stent at the site of very proximal total occlusion and insertion of a 2.5 x 22 mm Resolute stent in the mid LAD with the 95% stenosis being reduced to  0%.  There was initially no flow phenomena.  The patient was treated with Aggrastat infusion in addition to bivalirudin and Brilinta and received numerous doses of intracoronary nitroglycerin.  RECOMMENDATION: DAPT for minimum of 1 year.  We will cycle troponins.  Will admit the patient to to heart following his catheterization.  Optimal blood pressure control with ideal blood pressure less than 120/80.  Aggressive high potency lipid lowering therapy with target LDL less than 70.   TTE: 10/30/2018  IMPRESSIONS    1. Severe akinesis of the left ventricular, mid-apical anteroseptal wall.  2. The left ventricle has moderately reduced systolic function, with an ejection fraction of 35-40%. The cavity size was normal. Left ventricular diastolic Doppler parameters are consistent with pseudonormalization.  3. The right ventricle has normal systolic function. The cavity was normal. There is no increase in right ventricular wall thickness.  4. Left atrial size was mildly dilated.  5. The aortic valve is tricuspid Moderate thickening of the aortic valve Mild calcification of the aortic valve.  6. The interatrial septum was not assessed. _____________   History of Present Illness     Mr. Reinig is a 68 yo male with PMH of HTN, HL, DM, OSA, DDD, and Neuropathy who presented with chest pain. He was in his normal state of health until about 10:00 the morning of admission when he developed substernal chest discomfort.  This was nonradiating and it was associated with diaphoresis.  There was no nausea, vomiting, lightheadedness, or shortness of breath.  The patient's pain intensified  and he rated it as a 10/10 at its worst.  He chewed 2 aspirin and his pain improved slightly.  Stated that his pain had continued to improve since being started on IV heparin and IV nitroglycerin here in the hospital.  He has no past history of similar symptoms.  He has never had a cardiac catheterization and has no history of  myocardial infarction.  He had stress testing in 2017 which showed no significant abnormality.  He has had normal LV function by both nuclear testing and echo studies in 2017.  The patient is a non-smoker.  He has a family history of coronary artery disease in his mother at age 72. No premature CAD in the family.  Given symptoms he was admitted with plans for cardiac cath the following morning.   Hospital Course     1. Acute anterior MI: Underwent cardiac cath with PCI/DESx1 to the pLAD. EKG with residual ST elevation. On DAPT with ASA. Troponin 57.34. On BB, Losartan, and Spiro. Brilinta check notes $100 for 30 day supply will plan for 30 card at discharge. Would anticipate switching to Plavix after the 30days. Will plan to continue with Losartan for now given high cost of Entresto. Could consider switching as an outpatient if cost lowers.  -- coreg, losartan, Arlyce Harman, ASA, Brilinta and statin  2. Acute systolic HF: EF noted at 38%. No volume overload on exam. Continue with BB, ARB and spiro. Plan to repeat echo in 12 weeks to reassess LV function.   3. HL: on high dose statin, LDL 153. Recheck LFTs in 6-8 weeks  4. HTN: continue BB, losartan and ARB  5. DM: Hgb A1c 6.4. Will continue metformin, and Trulicity. Stop Actos given new Dx of CAD.   Devin Combs was seen by Dr. Burt Knack and determined stable for discharge home. Follow up in the office has been arranged. Medications are listed below.   _____________  Discharge Vitals Blood pressure 134/80, pulse 80, temperature (!) 97.5 F (36.4 C), temperature source Oral, resp. rate (!) 22, height 6\' 1"  (1.854 m), weight 122.3 kg, SpO2 96 %.  Filed Weights   10/29/18 1621 10/29/18 2027 10/30/18 0555  Weight: 117.9 kg 121.4 kg 122.3 kg    Labs & Radiologic Studies    CBC Recent Labs    10/30/18 1652 10/31/18 0819  WBC 11.8* 11.2*  HGB 12.6* 12.1*  HCT 38.0* 35.6*  MCV 89.6 89.4  PLT 236 182   Basic Metabolic Panel Recent Labs     10/31/18 0819 11/01/18 0336  NA 136 136  K 3.6 3.4*  CL 104 102  CO2 24 22  GLUCOSE 225* 172*  BUN 11 11  CREATININE 1.19 1.11  CALCIUM 9.0 9.3   Liver Function Tests Recent Labs    10/31/18 0819  AST 65*  ALT 39  ALKPHOS 81  BILITOT 1.0  PROT 6.3*  ALBUMIN 3.5   No results for input(s): LIPASE, AMYLASE in the last 72 hours. Cardiac Enzymes Recent Labs    10/29/18 2057 10/30/18 0257 10/30/18 1249  TROPONINI 1.04* 4.14* 57.34*   BNP Invalid input(s): POCBNP D-Dimer No results for input(s): DDIMER in the last 72 hours. Hemoglobin A1C Recent Labs    10/31/18 0819  HGBA1C 6.4*   Fasting Lipid Panel Recent Labs    10/30/18 0257  CHOL 216*  HDL 36*  LDLCALC 153*  TRIG 133  CHOLHDL 6.0   Thyroid Function Tests Recent Labs    10/31/18 0819  TSH 1.022  _____________  Dg Chest 2 View  Result Date: 10/29/2018 CLINICAL DATA:  Central chest pain beginning this morning. EXAM: CHEST - 2 VIEW COMPARISON:  PA and lateral chest 10/25/2006. FINDINGS: The lungs are clear. Eventration of the right hemidiaphragm is unchanged. No pneumothorax or pleural effusion. No acute or focal bony abnormality. IMPRESSION: No acute disease.  Stable compared to prior exam. Electronically Signed   By: Inge Rise M.D.   On: 10/29/2018 16:35   Ct Head Code Stroke Wo Contrast  Result Date: 10/30/2018 CLINICAL DATA:  Code stroke. Code stroke. Specific symptoms not elucidated. EXAM: CT HEAD WITHOUT CONTRAST TECHNIQUE: Contiguous axial images were obtained from the base of the skull through the vertex without intravenous contrast. COMPARISON:  None. FINDINGS: Brain: Generalized brain atrophy. No sign of acute infarction, mass lesion, hemorrhage, hydrocephalus or extra-axial collection. Vascular: 1 could question a hyperdense M2 segment on the left. This is not definite. Skull: Negative Sinuses/Orbits: Clear/normal Other: None ASPECTS ([place] Stroke Program Early CT Score) I was not  given the side of symptoms. - Ganglionic level infarction (caudate, lentiform nuclei, internal capsule, insula, M1-M3 cortex): 7 - Supraganglionic infarction (M4-M6 cortex): 3 Total score (0-10 with 10 being normal): 10 IMPRESSION: 1. No acute parenchymal finding. 1 could question a hyperdense M2 vessel on the left. 2. ASPECTS is 10. 3. These results were communicated to Dr. Leonel Ramsay at [place] 3/18/2020by text page via the [facility] messaging system. Electronically Signed   By: Nelson Chimes M.D.   On: 10/30/2018 19:13   Disposition   Pt is being discharged home today in good condition.  Follow-up Plans & Appointments    Follow-up Information    [facility], Crista Luria, [place] Follow up on 11/14/2018.   Specialty:  Cardiology Why:  at 9am for your follow up appt.  Contact information: [place] [address] [phone]          Discharge Instructions    Amb Referral to Cardiac Rehabilitation   Complete by:  As directed    Diagnosis:   Coronary Stents NSTEMI     Call MD for:  redness, tenderness, or signs of infection (pain, swelling, redness, odor or green/yellow discharge around incision site)   Complete by:  As directed    Diet - low sodium heart healthy   Complete by:  As directed    Discharge instructions   Complete by:  As directed    Radial Site Care Refer to this sheet in the next few weeks. These instructions provide you with information on caring for yourself after your procedure. Your caregiver may also give you more specific instructions. Your treatment has been planned according to current medical practices, but problems sometimes occur. Call your caregiver if you have any problems or questions after your procedure. HOME CARE INSTRUCTIONS You may shower the day after the procedure.Remove the bandage (dressing) and gently wash the site with plain soap and water.Gently pat the site dry.  Do not apply powder or lotion to the site.  Do not submerge the  affected site in water for 3 to 5 days.  Inspect the site at least twice daily.  Do not flex or bend the affected arm for 24 hours.  No lifting over 5 pounds (2.3 kg) for 5 days after your procedure.  Do not drive home if you are discharged the same day of the procedure. Have someone else drive you.  You may drive 24 hours after the procedure unless otherwise instructed by  your caregiver.  What to expect: Any bruising will usually fade within 1 to 2 weeks.  Blood that collects in the tissue (hematoma) may be painful to the touch. It should usually decrease in size and tenderness within 1 to 2 weeks.  SEEK IMMEDIATE MEDICAL CARE IF: You have unusual pain at the radial site.  You have redness, warmth, swelling, or pain at the radial site.  You have drainage (other than a small amount of blood on the dressing).  You have chills.  You have a fever or persistent symptoms for more than 72 hours.  You have a fever and your symptoms suddenly get worse.  Your arm becomes pale, cool, tingly, or numb.  You have heavy bleeding from the site. Hold pressure on the site.   PLEASE DO NOT MISS ANY DOSES OF YOUR BRILINTA!!!!! Also keep a log of you blood pressures and bring back to your follow up appt. Please call the office with any questions.   Patients taking blood thinners should generally stay away from medicines like ibuprofen, Advil, Motrin, naproxen, and Aleve due to risk of stomach bleeding. You may take Tylenol as directed or talk to your primary doctor about alternatives.   Increase activity slowly   Complete by:  As directed       Discharge Medications     Medication List    STOP taking these medications   diclofenac 75 MG EC tablet Commonly known as:  VOLTAREN   hydrochlorothiazide 12.5 MG capsule Commonly known as:  MICROZIDE   lisinopril 40 MG tablet Commonly known as:  PRINIVIL,ZESTRIL   methocarbamol 500 MG tablet Commonly known as:  Robaxin   pioglitazone 30 MG  tablet Commonly known as:  ACTOS   rosuvastatin 10 MG tablet Commonly known as:  CRESTOR   Testosterone 20.25 MG/ACT (1.62%) Gel     TAKE these medications   aspirin 81 MG EC tablet Take 1 tablet (81 mg total) by mouth daily. Start taking on:  November 02, 2018   atorvastatin 80 MG tablet Commonly known as:  LIPITOR Take 1 tablet (80 mg total) by mouth daily at 6 PM.   buPROPion 300 MG 24 hr tablet Commonly known as:  WELLBUTRIN XL Take 300 mg by mouth daily.   carvedilol 6.25 MG tablet Commonly known as:  COREG Take 1 tablet (6.25 mg total) by mouth 2 (two) times daily with a meal.   DULoxetine 60 MG capsule Commonly known as:  CYMBALTA Take 60 mg by mouth every evening.   fentaNYL 50 MCG/HR Commonly known as:  [place] 1 patch onto the skin every 3 (three) days.   folic acid 1 MG tablet Commonly known as:  FOLVITE Take 1 mg by mouth daily.   HYDROcodone-acetaminophen 10-325 MG tablet Commonly known as:  NORCO Take 1 tablet by mouth 4 (four) times daily.   losartan 50 MG tablet Commonly known as:  COZAAR Take 1 tablet (50 mg total) by mouth daily. Start taking on:  November 02, 2018   metFORMIN 1000 MG tablet Commonly known as:  GLUCOPHAGE Take 1,000 mg by mouth 2 (two) times daily with a meal.   nitroGLYCERIN 0.4 MG SL tablet Commonly known as:  NITROSTAT Place 1 tablet (0.4 mg total) under the tongue every 5 (five) minutes x 3 doses as needed for chest pain.   oxymetazoline 0.05 % nasal spray Commonly known as:  AFRIN Place 1 spray into both nostrils 2 (two) times daily as needed for congestion.  Prevagen 10 MG Caps Generic drug:  Apoaequorin Take 10 mg by mouth daily.   ranitidine 150 MG capsule Commonly known as:  ZANTAC Take 150 mg by mouth daily as needed for heartburn.   spironolactone 25 MG tablet Commonly known as:  ALDACTONE Take 1 tablet (25 mg total) by mouth at bedtime.   terbinafine 250 MG tablet Commonly known as:   LAMISIL Take 250 mg by mouth daily.   ticagrelor 90 MG Tabs tablet Commonly known as:  BRILINTA Take 1 tablet (90 mg total) by mouth 2 (two) times daily.   Trulicity 1.5 TF/5.7DU Sopn Generic drug:  Dulaglutide Inject 0.5 mLs into the skin every Saturday.   valACYclovir 1000 MG tablet Commonly known as:  VALTREX Take 1,000 mg by mouth daily as needed (outbreaks).       Acute coronary syndrome (MI, NSTEMI, STEMI, etc) this admission?: Yes.     AHA/ACC Clinical Performance & Quality Measures: 1. Aspirin prescribed? - Yes 2. ADP Receptor Inhibitor (Plavix/Clopidogrel, Brilinta/Ticagrelor or Effient/Prasugrel) prescribed (includes medically managed patients)? - Yes 3. Beta Blocker prescribed? - Yes 4. High Intensity Statin (Lipitor 40-80mg  or Crestor 20-40mg ) prescribed? - Yes 5. EF assessed during THIS hospitalization? - Yes 6. For EF <40%, was ACEI/ARB prescribed? - Yes 7. For EF <40%, Aldosterone Antagonist (Spironolactone or Eplerenone) prescribed? - Yes 8. Cardiac Rehab Phase II ordered (Included Medically managed Patients)? - Yes    Outstanding Labs/Studies   FLP/LFTs in 6 weeks, Echo in 12 weeks  Duration of Discharge Encounter   Greater than 30 minutes including physician time.  Signed, Reino Bellis NP-C 11/01/2018, 1:35 PM

## 2018-11-06 ENCOUNTER — Telehealth (HOSPITAL_COMMUNITY): Payer: Self-pay

## 2018-11-06 NOTE — Telephone Encounter (Signed)
Pt insurance is active and benefits verified through Medicare a/b Co-pay 0, DED $198/$198 met, out of pocket 0/0 met, co-insurance 20%. no pre-authorization required. Passport, 11/06/2018 @ 10:03am, REF# [id number]  2ndary insurance is active and benefits verified through [place]. Co-pay 0, DED 0/0 met, out of pocket 0/0 met, co-insurance 0. No pre-authorization required.  Will contact patient to see if he is interested in the Cardiac Rehab Program. If interested, patient will need to complete follow up appt. Once completed, patient will be contacted for scheduling upon review by the RN Navigator. Tedra Sen[place]. Support Rep II

## 2018-11-06 NOTE — Telephone Encounter (Signed)
Attempted to call patient in regards to Cardiac Rehab - to let pt know we are closed at this time due to the COVID-19 and will contact once we have resume scheduling. LMTCB °Gloria W. Support Rep II ° °

## 2018-11-07 NOTE — Telephone Encounter (Signed)
Due to [place] outbreak I have tried to reach patient multiple times to schedule next week appointment as video or telephone but no one has picked up phone. Will try again Monday or day of visit.

## 2018-11-11 ENCOUNTER — Telehealth: Payer: Self-pay | Admitting: Physician Assistant

## 2018-11-11 NOTE — Telephone Encounter (Signed)
Unable to reach patient again. Left voice mail to call back to reschedule or covert to virtual visit.

## 2018-11-13 NOTE — Telephone Encounter (Signed)
Placed call to pt re: appt, 11/14/2018 with Vin Bhagat, PA-C.  Vin has already cancelled appt since can't reach pt to speak with him. Left another detailed message on cell phone this morning for pt to call back to discuss virtual visit.

## 2018-11-14 ENCOUNTER — Telehealth (INDEPENDENT_AMBULATORY_CARE_PROVIDER_SITE_OTHER): Payer: Medicare Other | Admitting: Physician Assistant

## 2018-11-14 ENCOUNTER — Encounter: Payer: Self-pay | Admitting: *Deleted

## 2018-11-14 ENCOUNTER — Telehealth: Payer: Self-pay | Admitting: *Deleted

## 2018-11-14 ENCOUNTER — Encounter: Payer: Self-pay | Admitting: Physician Assistant

## 2018-11-14 ENCOUNTER — Ambulatory Visit: Payer: Medicare Other | Admitting: Physician Assistant

## 2018-11-14 ENCOUNTER — Telehealth: Payer: Self-pay | Admitting: Physician Assistant

## 2018-11-14 ENCOUNTER — Other Ambulatory Visit: Payer: Self-pay

## 2018-11-14 VITALS — BP 116/78 | HR 88 | Ht 73.0 in | Wt 252.0 lb

## 2018-11-14 DIAGNOSIS — G4733 Obstructive sleep apnea (adult) (pediatric): Secondary | ICD-10-CM

## 2018-11-14 DIAGNOSIS — Z9989 Dependence on other enabling machines and devices: Secondary | ICD-10-CM

## 2018-11-14 DIAGNOSIS — I2510 Atherosclerotic heart disease of native coronary artery without angina pectoris: Secondary | ICD-10-CM

## 2018-11-14 DIAGNOSIS — I5022 Chronic systolic (congestive) heart failure: Secondary | ICD-10-CM

## 2018-11-14 DIAGNOSIS — E785 Hyperlipidemia, unspecified: Secondary | ICD-10-CM

## 2018-11-14 DIAGNOSIS — I110 Hypertensive heart disease with heart failure: Secondary | ICD-10-CM

## 2018-11-14 MED ORDER — FUROSEMIDE 20 MG PO TABS: 20.0000 mg | ORAL_TABLET | Freq: Two times a day (BID) | ORAL | 1 refills | Status: DC | PRN

## 2018-11-14 MED ORDER — CLOPIDOGREL BISULFATE 75 MG PO TABS: ORAL_TABLET | ORAL | 3 refills | Status: DC

## 2018-11-14 NOTE — Telephone Encounter (Signed)
Spoke with patient via webex.  Confirmed all demographics and e-mail.  Having trouble with My Chart set up.  Gave him the number to call for assistance with resetting his password.

## 2018-11-14 NOTE — Patient Instructions (Addendum)
Medication Instructions:  Your physician has recommended you make the following change in your medication:  1.  START Lasix 20 mg taking only as needed for Dyspnea 2.  START Plavix 75 mg on day 1 you will take 4 tablets at once then day 2 and thereafter, you will take 1 tablet daily,  (DO NOT START UNTIL YOU FINISH THE BRILINTA)  If you need a refill on your cardiac medications before your next appointment, please call your pharmacy.   Lab work: 3 MONTHS:  FASTING LIPID & LFT  If you have labs (blood work) drawn today and your tests are completely normal, you will receive your results only by: Marland Kitchen MyChart Message (if you have MyChart) OR . A paper copy in the mail If you have any lab test that is abnormal or we need to change your treatment, we will call you to review the results.  Testing/Procedures: None Ordered  Follow-Up: At [facility], you and your health needs are our priority.  As part of our continuing mission to provide you with exceptional heart care, we have created designated Provider Care Teams.  These Care Teams include your primary Cardiologist (physician) and Advanced Practice Providers (APPs -  Physician Assistants and Nurse Practitioners) who all work together to provide you with the care you need, when you need it. You will need a follow up appointment in:  3 months. You may see Dr. Burt Knack or one of the following Advanced Practice Providers on your designated Care Team: Richardson Dopp, PA-C [place], [place] . Daune Perch, NP  Please call the office, at your convenience, to get this follow-up appt and lab appt scheduled,  Have a Great Day!  Any Other Special Instructions Will Be Listed Below (If Applicable).

## 2018-11-14 NOTE — Telephone Encounter (Signed)
Pt and wife walked into the office to request an appt with Vin Bhagat PA-C for TOC follow-up. Noted in the pts chart that both Vin Bhagat PA-C and Jeanann Lewandowsky CMA have been calling this pt and wife several times, to set up his virtual office visit for today 4/2 at 0900, but they were unsuccessful in making contact with the pt.  Went out and spoke to the pt and wife myself and offered for them to see Dr Meda Coffee today in clinic, but he would have to wait about an hour, for she has other pts she is seeing at this time.  This directions was given to me by Dr Meda Coffee.  Per the pts wife, they prefer not waiting or even being seen in the office today at all, for the wife reports the pt has a weakened immune system and needs to go home.  Wife states she would like to set-up a VIDEO or TELEPHONE visit with Vin Bhagat PA-C for today, if possible.  Spoke with Vin's CMA and she gave ok for the pt to be scheduled for virtual telehealth visit with Vin today at 1 pm.  This was scheduled by our Sharon.  Advised the pts wife that she needs to make sure that they answer the phone 30 mins prior to his scheduled appt with Vin.  Wife states she "promises to answer."  Wife states she updated contact numbers in the pts chart for them to call her cell and not the pts. Wife states to call them back in 30-40 mins on her cell phone or home phone number, for they have to make a run to the drug store before they go home.  Endorsed the time for Vin's CMA to call the pt and wife.  Best numbers to contact on is wifes [phone] and home [phone].  Will route this message to both Vin and Anderson Malta CMA as an [place].

## 2018-11-14 NOTE — Progress Notes (Signed)
Virtual Visit via Video Note    Evaluation Performed:  Follow-up visit  This visit type was conducted due to national recommendations for restrictions regarding the COVID-19 Pandemic (e.g. social distancing).  This format is felt to be most appropriate for this patient at this time.  All issues noted in this document were discussed and addressed.  No physical exam was performed (except for noted visual exam findings with Video Visits).  Please refer to the patient's chart (MyChart message for video visits and phone note for telephone visits) for the patient's consent to telehealth for [facility].  Date:  11/14/2018   ID:  Devin Combs, DOB [birth year], MRN [id number]  Patient Location:  Home  Provider location:   Home  PCP:  Maury Dus, MD  Cardiologist: Dr. Burt Knack  Chief Complaint: Hospital follow up  History of Present Illness:    Devin Combs is a 69 y.o. male who presents via video conferencing for a telehealth visit today.   The patient does nothave symptoms concerning for COVID-19 infection (fever, chills, cough, or new shortness of breath).   Devin Combs is a 69 yo male with PMH of HTN, HL, DM, OSA on CPAP, DDD, and Neuropathy who recently admitted with acute anterior MI. Underwent cardiac cath with PCI/DESx1 to the pLAD. Troponin 57.34. On DAPT with ASA and brillinta. Plan to change to plavix after 30 days due to cost. EF noted at 35%. Continue with BB, ARB and spiro. Not on entresto due to cost.  Plan to repeat echo in 12 weeks to reassess LV function.  He is feeling well, energy is improving. He has intermittent dyspnea and orthopnea. Wife is nurse who hasn't noted any rales or le edema. No chest pain. Compliant with medications. Trying to cut back on salt. Uses CPAP.    Prior CV studies:   The following studies were reviewed today:  Cath: 10/30/2018   1st Diag lesion is 70% stenosed.  Prox LAD lesion is 100% stenosed.  Prox Cx lesion is 10%  stenosed.  Dist Cx lesion is 20% stenosed.  Prox RCA to Mid RCA lesion is 10% stenosed.  Post Atrio lesion is 50% stenosed.  Mid LAD lesion is 95% stenosed.  Post intervention, there is a 0% residual stenosis.  A drug-eluting stent was successfully placed.  Post intervention, there is a 0% residual stenosis.  LV end diastolic pressure is mildly elevated.  There is mild to moderate left ventricular systolic dysfunction.  A stent was successfully placed.  Acute coronary syndrome secondary to total occlusion of the very proximal LAD immediately after a takeoff of a very proximal, near ostial diagonal vessel with initial TIMI 0 flow.  Mild concomitant CAD with 10% mid and 20% distal circumflex stenoses and 10% mid RCA stenosis with 50% distal focal narrowing in the continuation branch prior to the PLA takeoff. There were minimal collaterals to the LAD.  Mild to moderate acute LV dysfunction with an EF of 40 to 45% and hypocontractility involving the mid distal anterolateral wall and apex. LVEDP 20 mmHg.  Difficult but successful percutaneous coronary intervention of the very proximal LAD occlusion with ultimate insertion of a 3.0 x 38 mm Resolute Onyx DES stent at the site of very proximal total occlusion and insertion of a 2.5 x 22 mm Resolute stent in the mid LAD with the 95% stenosis being reduced to 0%. There was initially no flow phenomena. The patient was treated with Aggrastat infusion in addition to bivalirudin and Brilinta and received numerous  doses of intracoronary nitroglycerin.  RECOMMENDATION: DAPT for minimum of 1 year. We will cycle troponins. Will admit the patient to to heart following his catheterization. Optimal blood pressure control with ideal blood pressure less than 120/80. Aggressive high potency lipid lowering therapy with target LDL less than 70.   TTE: 10/30/2018  IMPRESSIONS   1. Severe akinesis of the left ventricular, mid-apical  anteroseptal wall. 2. The left ventricle has moderately reduced systolic function, with an ejection fraction of 35-40%. The cavity size was normal. Left ventricular diastolic Doppler parameters are consistent with pseudonormalization. 3. The right ventricle has normal systolic function. The cavity was normal. There is no increase in right ventricular wall thickness. 4. Left atrial size was mildly dilated. 5. The aortic valve is tricuspid Moderate thickening of the aortic valve Mild calcification of the aortic valve. 6. The interatrial septum was not assessed. _____________   Past Medical History:  Diagnosis Date  . Acute systolic heart failure ([place])   . Anxiety   . Arthritis   . Cancer (HCC)    basal and squamous cell carcinoma  . Chronic back pain   . DDD (degenerative disc disease), thoracolumbar   . Degenerative cervical spinal stenosis   . Depression   . Diabetic autonomic neuropathy ([place])   . Dyspnea    with exertion   . GERD (gastroesophageal reflux disease)   . Headache   . History of bronchitis   . History of kidney stones   . Hyperlipidemia   . Hypertension   . Hypogonadism in male   . Myocardial infarction ([place])   . Neuropathy   . NSTEMI (non-ST elevated myocardial infarction) ([place])    10/29/2018- PCI/DESx1 to pLAD, EF 35%  . Orthostatic hypotension   . OSA (obstructive sleep apnea)    per pt study done 2006 (approx)  used cpap few yrs then stopped using, stated didn't feel he needed it anymore  . Psoriasis   . Retinal tear   . Right knee meniscal tear   . Type 2 diabetes mellitus ([place])   . Urinary hesitancy   . Vitamin D deficiency    Past Surgical History:  Procedure Laterality Date  . CATARACT EXTRACTION W/ INTRAOCULAR LENS  IMPLANT, BILATERAL  right 2015//  left 2012  . CORONARY/GRAFT ACUTE MI REVASCULARIZATION N/A 10/30/2018   Procedure: Coronary/Graft Acute MI Revascularization;  Surgeon: Troy Sine, MD;  Location: [place] [facility];  Service:  Cardiovascular;  Laterality: N/A;  . CORRECTION REPAIR MULTIPLE  TOES , LEFT FOOT  YRS AGO  . EXTRACORPOREAL SHOCK WAVE LITHOTRIPSY  2000 approx  . KNEE ARTHROSCOPY Left 2007  . KNEE ARTHROSCOPY Right 02/10/2015   Procedure: ARTHROSCOPY RIGHT KNEE WITH DEBRIDEMENT AND CHRONDROPLASTY;  Surgeon: Gaynelle Arabian, MD;  Location: [place];  Service: Orthopedics;  Laterality: Right;  . LEFT HEART CATH AND CORONARY ANGIOGRAPHY N/A 10/30/2018   Procedure: LEFT HEART CATH AND CORONARY ANGIOGRAPHY;  Surgeon: Troy Sine, MD;  Location: [place] [facility];  Service: Cardiovascular;  Laterality: N/A;  . PARTIAL KNEE ARTHROPLASTY Right 07/19/2016   Procedure: RIGHT KNEE MEDICAL UNICOMPARTMENTAL ARTHROPLASTY;  Surgeon: Gaynelle Arabian, MD;  Location: [facility];  Service: Orthopedics;  Laterality: Right;  . RADICAL ORCHIECTOMY Left 1996   benign  . REMOVAL SPURS RIGHT GREAT TOE  YRS AGO  . TOTAL KNEE ARTHROPLASTY Left 09-16-2007     Current Meds  Medication Sig  . Apoaequorin (PREVAGEN) 10 MG CAPS Take 10 mg by mouth daily.   Marland Kitchen  aspirin EC 81 MG EC tablet Take 1 tablet (81 mg total) by mouth daily.  Marland Kitchen atorvastatin (LIPITOR) 80 MG tablet Take 1 tablet (80 mg total) by mouth daily at 6 PM.  . buPROPion (WELLBUTRIN XL) 300 MG 24 hr tablet Take 300 mg by mouth daily.  . carvedilol (COREG) 6.25 MG tablet Take 1 tablet (6.25 mg total) by mouth 2 (two) times daily with a meal.  . DULoxetine (CYMBALTA) 60 MG capsule Take 60 mg by mouth every evening.  . fentaNYL (DURAGESIC) 50 MCG/HR Place 1 patch onto the skin every 3 (three) days.  . folic acid (FOLVITE) 1 MG tablet Take 1 mg by mouth daily.  Marland Kitchen HYDROcodone-acetaminophen (NORCO) 10-325 MG tablet Take 1 tablet by mouth 4 (four) times daily.   Marland Kitchen losartan (COZAAR) 50 MG tablet Take 1 tablet (50 mg total) by mouth daily.  . metFORMIN (GLUCOPHAGE) 1000 MG tablet Take 1,000 mg by mouth 2 (two) times daily with a meal.  . nitroGLYCERIN (NITROSTAT) 0.4 MG  SL tablet Place 1 tablet (0.4 mg total) under the tongue every 5 (five) minutes x 3 doses as needed for chest pain.  Marland Kitchen oxymetazoline (AFRIN) 0.05 % nasal spray Place 1 spray into both nostrils 2 (two) times daily as needed for congestion.  . ranitidine (ZANTAC) 150 MG capsule Take 150 mg by mouth daily as needed for heartburn.  . spironolactone (ALDACTONE) 25 MG tablet Take 1 tablet (25 mg total) by mouth at bedtime.  . terbinafine (LAMISIL) 250 MG tablet Take 250 mg by mouth daily.  . ticagrelor (BRILINTA) 90 MG TABS tablet Take 1 tablet (90 mg total) by mouth 2 (two) times daily.  . TRULICITY 1.5 OZ/3.6UY SOPN Inject 0.5 mLs into the skin every Saturday.   . valACYclovir (VALTREX) 1000 MG tablet Take 1,000 mg by mouth daily as needed (outbreaks).      Allergies:   Patient has no known allergies.   Social History   Tobacco Use  . Smoking status: Never Smoker  . Smokeless tobacco: Never Used  Substance Use Topics  . Alcohol use: Yes    Comment: rare  . Drug use: No     Family Hx: The patient's family history is negative for Neuropathy.  ROS:   Please see the history of present illness.    All other systems reviewed and are negative.   Labs/Other Tests and Data Reviewed:    Recent Labs: 10/31/2018: ALT 39; Hemoglobin 12.1; Platelets 238; TSH 1.022 11/01/2018: BUN 11; Creatinine, Ser 1.11; Potassium 3.4; Sodium 136   Recent Lipid Panel Lab Results  Component Value Date/Time   CHOL 216 (H) 10/30/2018 02:57 AM   TRIG 133 10/30/2018 02:57 AM   HDL 36 (L) 10/30/2018 02:57 AM   CHOLHDL 6.0 10/30/2018 02:57 AM   LDLCALC 153 (H) 10/30/2018 02:57 AM    Wt Readings from Last 3 Encounters:  11/14/18 252 lb (114.3 kg)  10/30/18 269 lb 9.6 oz (122.3 kg)  08/15/18 263 lb 12.8 oz (119.7 kg)     Objective:    Vital Signs:  BP 116/78   Pulse 88   Ht 6\' 1"  (1.854 m)   Wt 252 lb (114.3 kg)   BMI 33.25 kg/m    Well nourished, well developed male in no acute distress. Psych:  normal affect Patient denies any LE edema  ASSESSMENT & PLAN:    1. CAD: S/p PCI/DESx1 to the pLAD. No chest pain. Continue  DAPT with ASA and Brillinta. Change to plavix  once runs out. Continue statin and BB.  2. Chronic systolic HF:EF noted at 38-88%.  Continue with BB, ARB and spiro. Plan to repeat echo in 12 weeks to reassess LV function.   3. HL: 10/30/2018: Cholesterol 216; HDL 36; LDL Cholesterol 153; Triglycerides 133; VLDL 27 Recheck LFTs 8 weeks  4. HTN:BP stable on current medications  5. DM: Per PCP.  6. Dyspnea: hard to differentiae. Could be due to brillinta, sleep apnea or CHF. PRN lasix. Wife will keep eye. Will let us know if worsening of symptoms.    COVID-19 Education: The signs and symptoms of COVID-19 were discussed with the patient and how to seek care for testing (follow up with PCP or arrange E-visit).  The importance of social distancing was discussed today.  Patient Risk:   After full review of this patient's clinical status, I feel that they are at least moderate risk at this time.  Time:   Today, I have spent 25 minutes with the patient with telehealth technology, developing & discussing different plans, discussion of disease and its complications, life style changes.    Medication Adjustments/Labs and Tests Ordered: Current medicines are reviewed at length with the patient today.  Concerns regarding medicines are outlined above.  Tests Ordered: Orders Placed This Encounter  Procedures  . Lipid panel  . Hepatic function panel   Medication Changes: Meds ordered this encounter  Medications  . furosemide (LASIX) 20 MG tablet    Sig: Take 1 tablet (20 mg total) by mouth 3 times/day as needed-between meals & bedtime (dyspea).    Dispense:  30 tablet    Refill:  1  . clopidogrel (PLAVIX) 75 MG tablet    Sig: Take 4 tablets by mouth on day 1, take 1 tablet by mouth daily thereafter    Dispense:  94 tablet    Refill:  3    Disposition:   Follow up in 3 month(s)  Signed, Leanor Kail, PA  11/14/2018 1:39 PM    [facility]

## 2018-11-20 DIAGNOSIS — Z79899 Other long term (current) drug therapy: Secondary | ICD-10-CM | POA: Diagnosis not present

## 2018-11-20 DIAGNOSIS — M545 Low back pain: Secondary | ICD-10-CM | POA: Diagnosis not present

## 2018-11-20 DIAGNOSIS — G8929 Other chronic pain: Secondary | ICD-10-CM | POA: Diagnosis not present

## 2018-11-20 DIAGNOSIS — M5136 Other intervertebral disc degeneration, lumbar region: Secondary | ICD-10-CM | POA: Diagnosis not present

## 2018-12-05 DIAGNOSIS — F324 Major depressive disorder, single episode, in partial remission: Secondary | ICD-10-CM | POA: Diagnosis not present

## 2018-12-05 DIAGNOSIS — E1140 Type 2 diabetes mellitus with diabetic neuropathy, unspecified: Secondary | ICD-10-CM | POA: Diagnosis not present

## 2018-12-05 DIAGNOSIS — E1165 Type 2 diabetes mellitus with hyperglycemia: Secondary | ICD-10-CM | POA: Diagnosis not present

## 2018-12-05 DIAGNOSIS — E1141 Type 2 diabetes mellitus with diabetic mononeuropathy: Secondary | ICD-10-CM | POA: Diagnosis not present

## 2018-12-05 DIAGNOSIS — E7800 Pure hypercholesterolemia, unspecified: Secondary | ICD-10-CM | POA: Diagnosis not present

## 2018-12-05 DIAGNOSIS — E1142 Type 2 diabetes mellitus with diabetic polyneuropathy: Secondary | ICD-10-CM | POA: Diagnosis not present

## 2018-12-05 DIAGNOSIS — I10 Essential (primary) hypertension: Secondary | ICD-10-CM | POA: Diagnosis not present

## 2018-12-10 ENCOUNTER — Telehealth (HOSPITAL_COMMUNITY): Payer: Self-pay | Admitting: *Deleted

## 2018-12-16 ENCOUNTER — Other Ambulatory Visit: Payer: Self-pay | Admitting: Cardiology

## 2018-12-23 DIAGNOSIS — E669 Obesity, unspecified: Secondary | ICD-10-CM | POA: Diagnosis not present

## 2018-12-23 DIAGNOSIS — M5136 Other intervertebral disc degeneration, lumbar region: Secondary | ICD-10-CM | POA: Diagnosis not present

## 2018-12-23 DIAGNOSIS — M545 Low back pain: Secondary | ICD-10-CM | POA: Diagnosis not present

## 2018-12-23 DIAGNOSIS — G8929 Other chronic pain: Secondary | ICD-10-CM | POA: Diagnosis not present

## 2018-12-23 DIAGNOSIS — Z79899 Other long term (current) drug therapy: Secondary | ICD-10-CM | POA: Diagnosis not present

## 2018-12-24 ENCOUNTER — Other Ambulatory Visit: Payer: Self-pay | Admitting: Physician Assistant

## 2019-01-20 DIAGNOSIS — M48061 Spinal stenosis, lumbar region without neurogenic claudication: Secondary | ICD-10-CM | POA: Diagnosis not present

## 2019-01-20 DIAGNOSIS — E291 Testicular hypofunction: Secondary | ICD-10-CM | POA: Diagnosis not present

## 2019-01-20 DIAGNOSIS — Z79899 Other long term (current) drug therapy: Secondary | ICD-10-CM | POA: Diagnosis not present

## 2019-01-20 DIAGNOSIS — E7800 Pure hypercholesterolemia, unspecified: Secondary | ICD-10-CM | POA: Diagnosis not present

## 2019-01-20 DIAGNOSIS — E1165 Type 2 diabetes mellitus with hyperglycemia: Secondary | ICD-10-CM | POA: Diagnosis not present

## 2019-01-20 DIAGNOSIS — R300 Dysuria: Secondary | ICD-10-CM | POA: Diagnosis not present

## 2019-01-20 DIAGNOSIS — Z1159 Encounter for screening for other viral diseases: Secondary | ICD-10-CM | POA: Diagnosis not present

## 2019-01-20 DIAGNOSIS — E559 Vitamin D deficiency, unspecified: Secondary | ICD-10-CM | POA: Diagnosis not present

## 2019-01-20 DIAGNOSIS — R5383 Other fatigue: Secondary | ICD-10-CM | POA: Diagnosis not present

## 2019-01-20 DIAGNOSIS — D539 Nutritional anemia, unspecified: Secondary | ICD-10-CM | POA: Diagnosis not present

## 2019-01-20 DIAGNOSIS — M129 Arthropathy, unspecified: Secondary | ICD-10-CM | POA: Diagnosis not present

## 2019-01-20 DIAGNOSIS — M545 Low back pain: Secondary | ICD-10-CM | POA: Diagnosis not present

## 2019-02-07 ENCOUNTER — Telehealth (HOSPITAL_COMMUNITY): Payer: Self-pay

## 2019-02-07 NOTE — Telephone Encounter (Signed)
No response from pt, closed referral. °

## 2019-02-18 DIAGNOSIS — M48061 Spinal stenosis, lumbar region without neurogenic claudication: Secondary | ICD-10-CM | POA: Diagnosis not present

## 2019-02-18 DIAGNOSIS — M5440 Lumbago with sciatica, unspecified side: Secondary | ICD-10-CM | POA: Diagnosis not present

## 2019-02-18 DIAGNOSIS — M5136 Other intervertebral disc degeneration, lumbar region: Secondary | ICD-10-CM | POA: Diagnosis not present

## 2019-02-18 DIAGNOSIS — E1165 Type 2 diabetes mellitus with hyperglycemia: Secondary | ICD-10-CM | POA: Diagnosis not present

## 2019-02-18 DIAGNOSIS — Z79899 Other long term (current) drug therapy: Secondary | ICD-10-CM | POA: Diagnosis not present

## 2019-03-18 DIAGNOSIS — M4310 Spondylolisthesis, site unspecified: Secondary | ICD-10-CM | POA: Diagnosis not present

## 2019-03-18 DIAGNOSIS — M5136 Other intervertebral disc degeneration, lumbar region: Secondary | ICD-10-CM | POA: Diagnosis not present

## 2019-03-18 DIAGNOSIS — M48061 Spinal stenosis, lumbar region without neurogenic claudication: Secondary | ICD-10-CM | POA: Diagnosis not present

## 2019-03-18 DIAGNOSIS — M5440 Lumbago with sciatica, unspecified side: Secondary | ICD-10-CM | POA: Diagnosis not present

## 2019-03-18 DIAGNOSIS — Z79899 Other long term (current) drug therapy: Secondary | ICD-10-CM | POA: Diagnosis not present

## 2019-03-20 ENCOUNTER — Ambulatory Visit: Payer: Medicare Other | Admitting: Adult Health

## 2019-03-21 ENCOUNTER — Other Ambulatory Visit: Payer: Self-pay | Admitting: Cardiology

## 2019-03-22 ENCOUNTER — Other Ambulatory Visit: Payer: Self-pay | Admitting: Physician Assistant

## 2019-03-31 DIAGNOSIS — K219 Gastro-esophageal reflux disease without esophagitis: Secondary | ICD-10-CM | POA: Diagnosis not present

## 2019-03-31 DIAGNOSIS — I951 Orthostatic hypotension: Secondary | ICD-10-CM | POA: Diagnosis not present

## 2019-03-31 DIAGNOSIS — E559 Vitamin D deficiency, unspecified: Secondary | ICD-10-CM | POA: Diagnosis not present

## 2019-03-31 DIAGNOSIS — G4733 Obstructive sleep apnea (adult) (pediatric): Secondary | ICD-10-CM | POA: Diagnosis not present

## 2019-03-31 DIAGNOSIS — K580 Irritable bowel syndrome with diarrhea: Secondary | ICD-10-CM | POA: Diagnosis not present

## 2019-03-31 DIAGNOSIS — I10 Essential (primary) hypertension: Secondary | ICD-10-CM | POA: Diagnosis not present

## 2019-03-31 DIAGNOSIS — L409 Psoriasis, unspecified: Secondary | ICD-10-CM | POA: Diagnosis not present

## 2019-03-31 DIAGNOSIS — E7800 Pure hypercholesterolemia, unspecified: Secondary | ICD-10-CM | POA: Diagnosis not present

## 2019-03-31 DIAGNOSIS — E1140 Type 2 diabetes mellitus with diabetic neuropathy, unspecified: Secondary | ICD-10-CM | POA: Diagnosis not present

## 2019-03-31 DIAGNOSIS — M109 Gout, unspecified: Secondary | ICD-10-CM | POA: Diagnosis not present

## 2019-03-31 DIAGNOSIS — E1142 Type 2 diabetes mellitus with diabetic polyneuropathy: Secondary | ICD-10-CM | POA: Diagnosis not present

## 2019-03-31 DIAGNOSIS — E291 Testicular hypofunction: Secondary | ICD-10-CM | POA: Diagnosis not present

## 2019-04-15 DIAGNOSIS — E559 Vitamin D deficiency, unspecified: Secondary | ICD-10-CM | POA: Diagnosis not present

## 2019-04-15 DIAGNOSIS — D539 Nutritional anemia, unspecified: Secondary | ICD-10-CM | POA: Diagnosis not present

## 2019-04-15 DIAGNOSIS — R300 Dysuria: Secondary | ICD-10-CM | POA: Diagnosis not present

## 2019-04-15 DIAGNOSIS — R5383 Other fatigue: Secondary | ICD-10-CM | POA: Diagnosis not present

## 2019-04-15 DIAGNOSIS — M129 Arthropathy, unspecified: Secondary | ICD-10-CM | POA: Diagnosis not present

## 2019-04-15 DIAGNOSIS — Z1159 Encounter for screening for other viral diseases: Secondary | ICD-10-CM | POA: Diagnosis not present

## 2019-04-15 DIAGNOSIS — E7800 Pure hypercholesterolemia, unspecified: Secondary | ICD-10-CM | POA: Diagnosis not present

## 2019-04-15 DIAGNOSIS — E291 Testicular hypofunction: Secondary | ICD-10-CM | POA: Diagnosis not present

## 2019-04-15 DIAGNOSIS — M545 Low back pain: Secondary | ICD-10-CM | POA: Diagnosis not present

## 2019-04-15 DIAGNOSIS — Z79899 Other long term (current) drug therapy: Secondary | ICD-10-CM | POA: Diagnosis not present

## 2019-04-15 DIAGNOSIS — E1165 Type 2 diabetes mellitus with hyperglycemia: Secondary | ICD-10-CM | POA: Diagnosis not present

## 2019-04-15 DIAGNOSIS — M5136 Other intervertebral disc degeneration, lumbar region: Secondary | ICD-10-CM | POA: Diagnosis not present

## 2019-04-24 DIAGNOSIS — E1140 Type 2 diabetes mellitus with diabetic neuropathy, unspecified: Secondary | ICD-10-CM | POA: Diagnosis not present

## 2019-04-24 DIAGNOSIS — E291 Testicular hypofunction: Secondary | ICD-10-CM | POA: Diagnosis not present

## 2019-04-24 DIAGNOSIS — I10 Essential (primary) hypertension: Secondary | ICD-10-CM | POA: Diagnosis not present

## 2019-04-24 DIAGNOSIS — E1121 Type 2 diabetes mellitus with diabetic nephropathy: Secondary | ICD-10-CM | POA: Diagnosis not present

## 2019-04-29 DIAGNOSIS — F331 Major depressive disorder, recurrent, moderate: Secondary | ICD-10-CM | POA: Diagnosis not present

## 2019-05-13 DIAGNOSIS — L988 Other specified disorders of the skin and subcutaneous tissue: Secondary | ICD-10-CM | POA: Diagnosis not present

## 2019-05-13 DIAGNOSIS — L858 Other specified epidermal thickening: Secondary | ICD-10-CM | POA: Diagnosis not present

## 2019-05-13 DIAGNOSIS — L821 Other seborrheic keratosis: Secondary | ICD-10-CM | POA: Diagnosis not present

## 2019-05-15 DIAGNOSIS — Z79899 Other long term (current) drug therapy: Secondary | ICD-10-CM | POA: Diagnosis not present

## 2019-05-15 DIAGNOSIS — F331 Major depressive disorder, recurrent, moderate: Secondary | ICD-10-CM | POA: Diagnosis not present

## 2019-05-15 DIAGNOSIS — M545 Low back pain: Secondary | ICD-10-CM | POA: Diagnosis not present

## 2019-05-15 DIAGNOSIS — M48061 Spinal stenosis, lumbar region without neurogenic claudication: Secondary | ICD-10-CM | POA: Diagnosis not present

## 2019-05-15 DIAGNOSIS — Z23 Encounter for immunization: Secondary | ICD-10-CM | POA: Diagnosis not present

## 2019-05-15 DIAGNOSIS — M5136 Other intervertebral disc degeneration, lumbar region: Secondary | ICD-10-CM | POA: Diagnosis not present

## 2019-05-16 ENCOUNTER — Other Ambulatory Visit: Payer: Self-pay | Admitting: Cardiology

## 2019-05-20 DIAGNOSIS — L570 Actinic keratosis: Secondary | ICD-10-CM | POA: Diagnosis not present

## 2019-05-20 DIAGNOSIS — E860 Dehydration: Secondary | ICD-10-CM | POA: Diagnosis not present

## 2019-05-20 DIAGNOSIS — E291 Testicular hypofunction: Secondary | ICD-10-CM | POA: Diagnosis not present

## 2019-06-12 DIAGNOSIS — F331 Major depressive disorder, recurrent, moderate: Secondary | ICD-10-CM | POA: Diagnosis not present

## 2019-06-16 DIAGNOSIS — Z79899 Other long term (current) drug therapy: Secondary | ICD-10-CM | POA: Diagnosis not present

## 2019-06-16 DIAGNOSIS — G8929 Other chronic pain: Secondary | ICD-10-CM | POA: Diagnosis not present

## 2019-06-16 DIAGNOSIS — M545 Low back pain: Secondary | ICD-10-CM | POA: Diagnosis not present

## 2019-06-16 DIAGNOSIS — M5136 Other intervertebral disc degeneration, lumbar region: Secondary | ICD-10-CM | POA: Diagnosis not present

## 2019-06-18 ENCOUNTER — Other Ambulatory Visit: Payer: Self-pay | Admitting: Physician Assistant

## 2019-06-19 NOTE — Telephone Encounter (Signed)
Pt overdue for 3 month f/u.  Please contact pt for future appointment. 

## 2019-06-24 ENCOUNTER — Telehealth: Payer: Self-pay

## 2019-06-24 NOTE — Telephone Encounter (Signed)
Unable to get in contact with the patient. LVM asking him to call our office back to r/s his appt since his currently non-compliant with his cpap machine. Office number was provided.

## 2019-06-25 ENCOUNTER — Encounter: Payer: Self-pay | Admitting: Adult Health

## 2019-06-25 ENCOUNTER — Ambulatory Visit: Payer: Medicare Other | Admitting: Adult Health

## 2019-07-22 DIAGNOSIS — Z79899 Other long term (current) drug therapy: Secondary | ICD-10-CM | POA: Diagnosis not present

## 2019-07-22 DIAGNOSIS — M5136 Other intervertebral disc degeneration, lumbar region: Secondary | ICD-10-CM | POA: Diagnosis not present

## 2019-07-22 DIAGNOSIS — Z1159 Encounter for screening for other viral diseases: Secondary | ICD-10-CM | POA: Diagnosis not present

## 2019-07-22 DIAGNOSIS — E559 Vitamin D deficiency, unspecified: Secondary | ICD-10-CM | POA: Diagnosis not present

## 2019-07-22 DIAGNOSIS — E291 Testicular hypofunction: Secondary | ICD-10-CM | POA: Diagnosis not present

## 2019-07-22 DIAGNOSIS — E7800 Pure hypercholesterolemia, unspecified: Secondary | ICD-10-CM | POA: Diagnosis not present

## 2019-07-22 DIAGNOSIS — D539 Nutritional anemia, unspecified: Secondary | ICD-10-CM | POA: Diagnosis not present

## 2019-07-22 DIAGNOSIS — R300 Dysuria: Secondary | ICD-10-CM | POA: Diagnosis not present

## 2019-07-22 DIAGNOSIS — E1165 Type 2 diabetes mellitus with hyperglycemia: Secondary | ICD-10-CM | POA: Diagnosis not present

## 2019-07-22 DIAGNOSIS — M129 Arthropathy, unspecified: Secondary | ICD-10-CM | POA: Diagnosis not present

## 2019-08-04 ENCOUNTER — Other Ambulatory Visit: Payer: Self-pay | Admitting: Cardiology

## 2019-08-10 ENCOUNTER — Other Ambulatory Visit: Payer: Self-pay | Admitting: Cardiology

## 2019-08-28 DIAGNOSIS — Z79899 Other long term (current) drug therapy: Secondary | ICD-10-CM | POA: Diagnosis not present

## 2019-08-28 DIAGNOSIS — M5136 Other intervertebral disc degeneration, lumbar region: Secondary | ICD-10-CM | POA: Diagnosis not present

## 2019-08-28 DIAGNOSIS — M545 Low back pain: Secondary | ICD-10-CM | POA: Diagnosis not present

## 2019-08-28 DIAGNOSIS — G8929 Other chronic pain: Secondary | ICD-10-CM | POA: Diagnosis not present

## 2019-08-28 DIAGNOSIS — E1165 Type 2 diabetes mellitus with hyperglycemia: Secondary | ICD-10-CM | POA: Diagnosis not present

## 2019-09-11 DIAGNOSIS — E291 Testicular hypofunction: Secondary | ICD-10-CM | POA: Diagnosis not present

## 2019-09-11 DIAGNOSIS — K580 Irritable bowel syndrome with diarrhea: Secondary | ICD-10-CM | POA: Diagnosis not present

## 2019-09-11 DIAGNOSIS — Z0000 Encounter for general adult medical examination without abnormal findings: Secondary | ICD-10-CM | POA: Diagnosis not present

## 2019-09-11 DIAGNOSIS — G4733 Obstructive sleep apnea (adult) (pediatric): Secondary | ICD-10-CM | POA: Diagnosis not present

## 2019-09-11 DIAGNOSIS — I10 Essential (primary) hypertension: Secondary | ICD-10-CM | POA: Diagnosis not present

## 2019-09-11 DIAGNOSIS — E559 Vitamin D deficiency, unspecified: Secondary | ICD-10-CM | POA: Diagnosis not present

## 2019-09-11 DIAGNOSIS — E7800 Pure hypercholesterolemia, unspecified: Secondary | ICD-10-CM | POA: Diagnosis not present

## 2019-09-11 DIAGNOSIS — I951 Orthostatic hypotension: Secondary | ICD-10-CM | POA: Diagnosis not present

## 2019-09-11 DIAGNOSIS — K219 Gastro-esophageal reflux disease without esophagitis: Secondary | ICD-10-CM | POA: Diagnosis not present

## 2019-09-11 DIAGNOSIS — N529 Male erectile dysfunction, unspecified: Secondary | ICD-10-CM | POA: Diagnosis not present

## 2019-09-11 DIAGNOSIS — E1142 Type 2 diabetes mellitus with diabetic polyneuropathy: Secondary | ICD-10-CM | POA: Diagnosis not present

## 2019-09-19 DIAGNOSIS — E291 Testicular hypofunction: Secondary | ICD-10-CM | POA: Diagnosis not present

## 2019-09-19 DIAGNOSIS — E1140 Type 2 diabetes mellitus with diabetic neuropathy, unspecified: Secondary | ICD-10-CM | POA: Diagnosis not present

## 2019-09-19 DIAGNOSIS — E7800 Pure hypercholesterolemia, unspecified: Secondary | ICD-10-CM | POA: Diagnosis not present

## 2019-09-19 DIAGNOSIS — I10 Essential (primary) hypertension: Secondary | ICD-10-CM | POA: Diagnosis not present

## 2019-09-19 DIAGNOSIS — Z125 Encounter for screening for malignant neoplasm of prostate: Secondary | ICD-10-CM | POA: Diagnosis not present

## 2019-09-19 DIAGNOSIS — E559 Vitamin D deficiency, unspecified: Secondary | ICD-10-CM | POA: Diagnosis not present

## 2019-09-21 ENCOUNTER — Other Ambulatory Visit: Payer: Self-pay | Admitting: Physician Assistant

## 2019-09-29 DIAGNOSIS — G8929 Other chronic pain: Secondary | ICD-10-CM | POA: Diagnosis not present

## 2019-09-29 DIAGNOSIS — M5440 Lumbago with sciatica, unspecified side: Secondary | ICD-10-CM | POA: Diagnosis not present

## 2019-09-29 DIAGNOSIS — M5136 Other intervertebral disc degeneration, lumbar region: Secondary | ICD-10-CM | POA: Diagnosis not present

## 2019-09-29 DIAGNOSIS — M545 Low back pain: Secondary | ICD-10-CM | POA: Diagnosis not present

## 2019-09-29 DIAGNOSIS — Z79899 Other long term (current) drug therapy: Secondary | ICD-10-CM | POA: Diagnosis not present

## 2019-10-13 ENCOUNTER — Ambulatory Visit: Payer: Medicare Other | Attending: Internal Medicine

## 2019-10-13 DIAGNOSIS — Z23 Encounter for immunization: Secondary | ICD-10-CM

## 2019-10-13 NOTE — Progress Notes (Signed)
   Covid-19 Vaccination Clinic  Name:  Dominie Hallum    MRN: [id number] DOB: 19-May-1951  10/13/2019  Mr. Timbers was observed post Covid-19 immunization for 15 minutes without incidence. He was provided with Vaccine Information Sheet and instruction to access the V-Safe system.   Mr. Font was instructed to call 911 with any severe reactions post vaccine: Marland Kitchen Difficulty breathing  . Swelling of your face and throat  . A fast heartbeat  . A bad rash all over your body  . Dizziness and weakness    Immunizations Administered    Name Date Dose VIS Date Route   Pfizer COVID-19 Vaccine 10/13/2019 12:12 PM 0.3 mL 07/25/2019 Intramuscular   Manufacturer: [place]   Lot: [id number]   [place]: [id number]

## 2019-10-27 DIAGNOSIS — M4310 Spondylolisthesis, site unspecified: Secondary | ICD-10-CM | POA: Diagnosis not present

## 2019-10-27 DIAGNOSIS — Z79899 Other long term (current) drug therapy: Secondary | ICD-10-CM | POA: Diagnosis not present

## 2019-10-27 DIAGNOSIS — E1165 Type 2 diabetes mellitus with hyperglycemia: Secondary | ICD-10-CM | POA: Diagnosis not present

## 2019-10-27 DIAGNOSIS — E291 Testicular hypofunction: Secondary | ICD-10-CM | POA: Diagnosis not present

## 2019-10-27 DIAGNOSIS — M129 Arthropathy, unspecified: Secondary | ICD-10-CM | POA: Diagnosis not present

## 2019-10-27 DIAGNOSIS — Z1159 Encounter for screening for other viral diseases: Secondary | ICD-10-CM | POA: Diagnosis not present

## 2019-10-27 DIAGNOSIS — D539 Nutritional anemia, unspecified: Secondary | ICD-10-CM | POA: Diagnosis not present

## 2019-10-27 DIAGNOSIS — R5383 Other fatigue: Secondary | ICD-10-CM | POA: Diagnosis not present

## 2019-10-27 DIAGNOSIS — M48061 Spinal stenosis, lumbar region without neurogenic claudication: Secondary | ICD-10-CM | POA: Diagnosis not present

## 2019-10-27 DIAGNOSIS — R300 Dysuria: Secondary | ICD-10-CM | POA: Diagnosis not present

## 2019-10-27 DIAGNOSIS — E7800 Pure hypercholesterolemia, unspecified: Secondary | ICD-10-CM | POA: Diagnosis not present

## 2019-10-27 DIAGNOSIS — M5440 Lumbago with sciatica, unspecified side: Secondary | ICD-10-CM | POA: Diagnosis not present

## 2019-10-27 DIAGNOSIS — M5136 Other intervertebral disc degeneration, lumbar region: Secondary | ICD-10-CM | POA: Diagnosis not present

## 2019-10-27 DIAGNOSIS — E559 Vitamin D deficiency, unspecified: Secondary | ICD-10-CM | POA: Diagnosis not present

## 2019-11-05 ENCOUNTER — Other Ambulatory Visit: Payer: Self-pay | Admitting: Physician Assistant

## 2019-11-08 ENCOUNTER — Other Ambulatory Visit: Payer: Self-pay | Admitting: Cardiovascular Disease

## 2019-11-11 ENCOUNTER — Ambulatory Visit: Payer: Medicare Other

## 2019-11-11 ENCOUNTER — Ambulatory Visit: Payer: Medicare Other | Attending: Internal Medicine

## 2019-11-11 DIAGNOSIS — Z23 Encounter for immunization: Secondary | ICD-10-CM

## 2019-11-11 NOTE — Progress Notes (Signed)
   Covid-19 Vaccination Clinic  Name:  Devin Combs    MRN: [id number] DOB: 1950-12-10  11/11/2019  Devin Combs was observed post Covid-19 immunization for 15 minutes without incident. He was provided with Vaccine Information Sheet and instruction to access the V-Safe system.   Devin Combs was instructed to call 911 with any severe reactions post vaccine: Marland Kitchen Difficulty breathing  . Swelling of face and throat  . A fast heartbeat  . A bad rash all over body  . Dizziness and weakness   Immunizations Administered    Name Date Dose VIS Date Route   Pfizer COVID-19 Vaccine 11/11/2019  2:04 PM 0.3 mL 07/25/2019 Intramuscular   Manufacturer: [place]   Lot: [id number]   [place]: [id number]

## 2019-11-27 ENCOUNTER — Other Ambulatory Visit: Payer: Self-pay | Admitting: Cardiology

## 2019-11-27 DIAGNOSIS — M48061 Spinal stenosis, lumbar region without neurogenic claudication: Secondary | ICD-10-CM | POA: Diagnosis not present

## 2019-11-27 DIAGNOSIS — I10 Essential (primary) hypertension: Secondary | ICD-10-CM | POA: Diagnosis not present

## 2019-11-27 DIAGNOSIS — R5383 Other fatigue: Secondary | ICD-10-CM | POA: Diagnosis not present

## 2019-11-27 DIAGNOSIS — M5440 Lumbago with sciatica, unspecified side: Secondary | ICD-10-CM | POA: Diagnosis not present

## 2019-11-27 DIAGNOSIS — Z79899 Other long term (current) drug therapy: Secondary | ICD-10-CM | POA: Diagnosis not present

## 2019-12-02 ENCOUNTER — Other Ambulatory Visit: Payer: Self-pay | Admitting: Cardiovascular Disease

## 2019-12-08 DIAGNOSIS — Z7984 Long term (current) use of oral hypoglycemic drugs: Secondary | ICD-10-CM | POA: Diagnosis not present

## 2019-12-08 DIAGNOSIS — E7800 Pure hypercholesterolemia, unspecified: Secondary | ICD-10-CM | POA: Diagnosis not present

## 2019-12-08 DIAGNOSIS — E1142 Type 2 diabetes mellitus with diabetic polyneuropathy: Secondary | ICD-10-CM | POA: Diagnosis not present

## 2019-12-08 DIAGNOSIS — F331 Major depressive disorder, recurrent, moderate: Secondary | ICD-10-CM | POA: Diagnosis not present

## 2019-12-08 DIAGNOSIS — E1141 Type 2 diabetes mellitus with diabetic mononeuropathy: Secondary | ICD-10-CM | POA: Diagnosis not present

## 2019-12-08 DIAGNOSIS — I10 Essential (primary) hypertension: Secondary | ICD-10-CM | POA: Diagnosis not present

## 2019-12-21 ENCOUNTER — Other Ambulatory Visit: Payer: Self-pay | Admitting: Physician Assistant

## 2019-12-23 ENCOUNTER — Other Ambulatory Visit: Payer: Self-pay

## 2019-12-24 ENCOUNTER — Other Ambulatory Visit: Payer: Self-pay | Admitting: Cardiovascular Disease

## 2019-12-26 DIAGNOSIS — M48061 Spinal stenosis, lumbar region without neurogenic claudication: Secondary | ICD-10-CM | POA: Diagnosis not present

## 2019-12-26 DIAGNOSIS — L570 Actinic keratosis: Secondary | ICD-10-CM | POA: Diagnosis not present

## 2019-12-26 DIAGNOSIS — M5440 Lumbago with sciatica, unspecified side: Secondary | ICD-10-CM | POA: Diagnosis not present

## 2019-12-26 DIAGNOSIS — Z79899 Other long term (current) drug therapy: Secondary | ICD-10-CM | POA: Diagnosis not present

## 2019-12-26 DIAGNOSIS — H6123 Impacted cerumen, bilateral: Secondary | ICD-10-CM | POA: Diagnosis not present

## 2019-12-28 ENCOUNTER — Other Ambulatory Visit: Payer: Self-pay | Admitting: Cardiology

## 2020-01-08 DIAGNOSIS — D0461 Carcinoma in situ of skin of right upper limb, including shoulder: Secondary | ICD-10-CM | POA: Diagnosis not present

## 2020-01-08 DIAGNOSIS — D485 Neoplasm of uncertain behavior of skin: Secondary | ICD-10-CM | POA: Diagnosis not present

## 2020-01-08 DIAGNOSIS — B351 Tinea unguium: Secondary | ICD-10-CM | POA: Diagnosis not present

## 2020-01-13 DIAGNOSIS — D099 Carcinoma in situ, unspecified: Secondary | ICD-10-CM | POA: Diagnosis not present

## 2020-01-15 ENCOUNTER — Other Ambulatory Visit: Payer: Self-pay | Admitting: Physician Assistant

## 2020-01-17 ENCOUNTER — Other Ambulatory Visit: Payer: Self-pay | Admitting: Cardiovascular Disease

## 2020-01-26 DIAGNOSIS — I10 Essential (primary) hypertension: Secondary | ICD-10-CM | POA: Diagnosis not present

## 2020-01-26 DIAGNOSIS — M5440 Lumbago with sciatica, unspecified side: Secondary | ICD-10-CM | POA: Diagnosis not present

## 2020-01-26 DIAGNOSIS — M48061 Spinal stenosis, lumbar region without neurogenic claudication: Secondary | ICD-10-CM | POA: Diagnosis not present

## 2020-01-26 DIAGNOSIS — Z79899 Other long term (current) drug therapy: Secondary | ICD-10-CM | POA: Diagnosis not present

## 2020-01-30 ENCOUNTER — Telehealth: Payer: Self-pay | Admitting: Internal Medicine

## 2020-01-30 DIAGNOSIS — I951 Orthostatic hypotension: Secondary | ICD-10-CM | POA: Insufficient documentation

## 2020-01-30 NOTE — Telephone Encounter (Signed)
Returned call to patient's wife and advised that she may attend appointment on Monday with Dr. Caryl Comes. She states both she and the patient have had both Covid vaccines. She thanked me for the call.

## 2020-01-30 NOTE — Telephone Encounter (Signed)
° ° °  Pt's wife calling, she said if she can come int with pt to his appt on 06/21 she said pt gets severe brain fog and she is a nurse to help and assist him to get all important details from appt

## 2020-02-01 ENCOUNTER — Other Ambulatory Visit: Payer: Self-pay | Admitting: Cardiovascular Disease

## 2020-02-02 ENCOUNTER — Encounter: Payer: Self-pay | Admitting: Internal Medicine

## 2020-02-02 ENCOUNTER — Other Ambulatory Visit: Payer: Self-pay

## 2020-02-02 ENCOUNTER — Ambulatory Visit (INDEPENDENT_AMBULATORY_CARE_PROVIDER_SITE_OTHER): Payer: Medicare Other | Admitting: Internal Medicine

## 2020-02-02 VITALS — BP 120/72 | HR 78 | Ht 73.0 in | Wt 254.0 lb

## 2020-02-02 DIAGNOSIS — I252 Old myocardial infarction: Secondary | ICD-10-CM

## 2020-02-02 DIAGNOSIS — I951 Orthostatic hypotension: Secondary | ICD-10-CM | POA: Diagnosis not present

## 2020-02-02 NOTE — Patient Instructions (Addendum)
Medication Instructions:  Your physician recommends that you continue on your current medications as directed. Please refer to the Current Medication list given to you today.  *If you need a refill on your cardiac medications before your next appointment, please call your pharmacy*   Lab Work: None ordered.  If you have labs (blood work) drawn today and your tests are completely normal, you will receive your results only by: Marland Kitchen MyChart Message (if you have MyChart) OR . A paper copy in the mail If you have any lab test that is abnormal or we need to change your treatment, we will call you to review the results.   Testing/Procedures: Your physician has requested that you have an echocardiogram. Echocardiography is a painless test that uses sound waves to create images of your heart. It provides your doctor with information about the size and shape of your heart and how well your heart's chambers and valves are working. This procedure takes approximately one hour. There are no restrictions for this procedure.    Follow-Up: At [facility], you and your health needs are our priority.  As part of our continuing mission to provide you with exceptional heart care, we have created designated Provider Care Teams.  These Care Teams include your primary Cardiologist (physician) and Advanced Practice Providers (APPs -  Physician Assistants and Nurse Practitioners) who all work together to provide you with the care you need, when you need it.  We recommend signing up for the patient portal called "MyChart".  Sign up information is provided on this After Visit Summary.  MyChart is used to connect with patients for Virtual Visits (Telemedicine).  Patients are able to view lab/test results, encounter notes, upcoming appointments, etc.  Non-urgent messages can be sent to your provider as well.   To learn more about what you can do with MyChart, go to [email or web address].    Your next appointment:     3 months with Dr Burt Knack  12 months with Dr Caryl Comes

## 2020-02-02 NOTE — Progress Notes (Signed)
Patient Care Team: Maury Dus, MD as PCP - General (Family Medicine)   HPI  Devin Combs is a 69 y.o. male Seen in follow-up for symptomatic orthostatic hypotension occurring in the context of diabetes with autonomic neuropathy.  When seen 6/17 we reviewed the physiology of autonomic dysfunction; last seen 2019--orthostasis has been largely attenuated in the last year.  Presented with Acute MI 3/20  LAD occluded>>stent; EF depressed   Minimal dyspnea.  Activity remains limited by back pain which remains severe.  There is some possibility of surgical intervention.  No chest pain.  Trace edema.   DATE TEST EF   7/17 Echo   55-65 %   7/17 Myoview   65 % No ischemia  3/20 Echo  35-40%   3/20 LHC 40-45% LADp-T>>stent; LADm-95%>>stent CX 10-20%; RCA-10-20%   Date Cr K LDL Hgb  3/20 1.11 3.4 153 12.1  2/21 1.1 4.5 70 14.8      Past Medical History:  Diagnosis Date  . Acute systolic heart failure ([place])   . Anxiety   . Arthritis   . Cancer (HCC)    basal and squamous cell carcinoma  . Chronic back pain   . DDD (degenerative disc disease), thoracolumbar   . Degenerative cervical spinal stenosis   . Depression   . Diabetic autonomic neuropathy ([place])   . Dyspnea    with exertion   . GERD (gastroesophageal reflux disease)   . Headache   . History of bronchitis   . History of kidney stones   . Hyperlipidemia   . Hypertension   . Hypogonadism in male   . Myocardial infarction ([place])   . Neuropathy   . NSTEMI (non-ST elevated myocardial infarction) ([place])    10/29/2018- PCI/DESx1 to pLAD, EF 35%  . Orthostatic hypotension   . OSA (obstructive sleep apnea)    per pt study done 2006 (approx)  used cpap few yrs then stopped using, stated didn't feel he needed it anymore  . Psoriasis   . Retinal tear   . Right knee meniscal tear   . Type 2 diabetes mellitus ([place])   . Urinary hesitancy   . Vitamin D deficiency     Past Surgical History:  Procedure Laterality  Date  . CATARACT EXTRACTION W/ INTRAOCULAR LENS  IMPLANT, BILATERAL  right 2015//  left 2012  . CORONARY/GRAFT ACUTE MI REVASCULARIZATION N/A 10/30/2018   Procedure: Coronary/Graft Acute MI Revascularization;  Surgeon: Troy Sine, MD;  Location: [place] [facility];  Service: Cardiovascular;  Laterality: N/A;  . CORRECTION REPAIR MULTIPLE  TOES , LEFT FOOT  YRS AGO  . EXTRACORPOREAL SHOCK WAVE LITHOTRIPSY  2000 approx  . KNEE ARTHROSCOPY Left 2007  . KNEE ARTHROSCOPY Right 02/10/2015   Procedure: ARTHROSCOPY RIGHT KNEE WITH DEBRIDEMENT AND CHRONDROPLASTY;  Surgeon: Gaynelle Arabian, MD;  Location: [place];  Service: Orthopedics;  Laterality: Right;  . LEFT HEART CATH AND CORONARY ANGIOGRAPHY N/A 10/30/2018   Procedure: LEFT HEART CATH AND CORONARY ANGIOGRAPHY;  Surgeon: Troy Sine, MD;  Location: [place] [facility];  Service: Cardiovascular;  Laterality: N/A;  . PARTIAL KNEE ARTHROPLASTY Right 07/19/2016   Procedure: RIGHT KNEE MEDICAL UNICOMPARTMENTAL ARTHROPLASTY;  Surgeon: Gaynelle Arabian, MD;  [facility];  Service: Orthopedics;  Laterality: Right;  . RADICAL ORCHIECTOMY Left 1996   benign  . REMOVAL SPURS RIGHT GREAT TOE  YRS AGO  . TOTAL KNEE ARTHROPLASTY Left 09-16-2007    Current Outpatient Medications  Medication Sig  Dispense Refill  . amphetamine-dextroamphetamine (ADDERALL XR) 30 MG 24 hr capsule Take 30 mg by mouth daily.    Marland Kitchen Apoaequorin (PREVAGEN) 10 MG CAPS Take 10 mg by mouth daily.     Marland Kitchen aspirin EC 81 MG EC tablet Take 1 tablet (81 mg total) by mouth daily. 30 tablet 1  . atorvastatin (LIPITOR) 80 MG tablet TAKE 1 TABLET BY MOUTH EVERY DAY AT 6PM 90 tablet 0  . buPROPion (WELLBUTRIN XL) 300 MG 24 hr tablet Take 300 mg by mouth daily.    . carvedilol (COREG) 6.25 MG tablet Take 1 tablet (6.25 mg total) by mouth 2 (two) times daily with a meal. 90 tablet 0  . clopidogrel (PLAVIX) 75 MG tablet Take 1 tablet (75 mg total) by mouth daily. Please make  yearly appt with Dr. Burt Knack for April for future refills. 1st attempt 90 tablet 0  . DULoxetine (CYMBALTA) 60 MG capsule Take 60 mg by mouth every evening.    . fentaNYL (DURAGESIC) 75 MCG/HR Place 1 patch onto the skin every 3 (three) days.    . folic acid (FOLVITE) 1 MG tablet Take 1 mg by mouth daily.    . furosemide (LASIX) 20 MG tablet TAKE 1 TABLET BY MOUTH THREE TIMES A DAY AS NEEDED BETWEEN MEALS AND AT BEDTIME 270 tablet 0  . HYDROcodone-acetaminophen (NORCO) 10-325 MG tablet Take 1 tablet by mouth 4 (four) times daily.     Marland Kitchen Hyoscyamine Sulfate SL 0.125 MG SUBL Place 1 tablet under the tongue as needed for constipation.    Marland Kitchen losartan (COZAAR) 50 MG tablet Take 1 tablet (50 mg total) by mouth daily. Patient is overdue for an appointment and will need to call and schedule for further refills 2nd attempt 14 tablet 0  . metFORMIN (GLUCOPHAGE) 1000 MG tablet Take 1,000 mg by mouth 2 (two) times daily with a meal.    . nitroGLYCERIN (NITROSTAT) 0.4 MG SL tablet Place 1 tablet (0.4 mg total) under the tongue every 5 (five) minutes x 3 doses as needed for chest pain. 25 tablet 2  . oxymetazoline (AFRIN) 0.05 % nasal spray Place 1 spray into both nostrils 2 (two) times daily as needed for congestion.    . pioglitazone (ACTOS) 30 MG tablet Take 30 mg by mouth every morning.    . ranitidine (ZANTAC) 150 MG capsule Take 150 mg by mouth daily as needed for heartburn.    . spironolactone (ALDACTONE) 25 MG tablet TAKE 1 TABLET (25 MG TOTAL) BY MOUTH AT BEDTIME. PLEASE MAKE OVERDUE APPT WITH DR. Burt Knack BEFORE ANYMORE REFILLS. 1ST ATTEMPT 30 tablet 0  . terbinafine (LAMISIL) 250 MG tablet Take 250 mg by mouth daily.    . ticagrelor (BRILINTA) 90 MG TABS tablet Take 1 tablet (90 mg total) by mouth 2 (two) times daily. 180 tablet 2  . TRULICITY 1.5 JK/9.3OI SOPN Inject 0.5 mLs into the skin every Saturday.     . valACYclovir (VALTREX) 1000 MG tablet Take 1,000 mg by mouth daily as needed (outbreaks).       No current facility-administered medications for this visit.    No Known Allergies    Review of Systems negative except from HPI and PMH  Physical Exam BP 120/72   Pulse 78   Ht 6\' 1"  (1.854 m)   Wt 254 lb (115.2 kg)   SpO2 98%   BMI 33.51 kg/m  Well developed and nourished in no acute distress HENT normal Neck supple with JVP-  flat  Clear Regular rate and rhythm, no murmurs or gallops Abd-soft with active BS No Clubbing cyanosis edema Skin-warm and diaphoretic A & Oriented  Grossly normal sensory and motor function  ECG sinus at 78 Interval 17/14/40 Axis left -63 Right bundle branch block  Assessment and  Plan Diabetes with autonomic neuropathy  Orthostatic hypotension  Abnormal ECG  Right bundle branch block is new since 10/19.  Was evident 3/20  CAD-s/p AMI LAD stenting 3/20  Chronic pain-narcotic dependent  Hyperlipidemia  Memory issues   Patient's orthostasis has largely resolved.  Not quite sure why.  But grateful.  No evidence of recurrent ischemia by history.  LDL was at target 2/21>> continue atorvastatin.  Need to reassess LV function following LAD intervention.  If it is reasonable, would encourage him to follow-up with Dr. Rolena Infante about possible surgical relief for his back pain.  Right bundle branch block is now chronic.  No symptoms to suggest intermittent aggravation of conduction disease

## 2020-02-12 ENCOUNTER — Other Ambulatory Visit: Payer: Self-pay | Admitting: Cardiovascular Disease

## 2020-02-16 ENCOUNTER — Other Ambulatory Visit: Payer: Self-pay | Admitting: Cardiovascular Disease

## 2020-02-23 ENCOUNTER — Other Ambulatory Visit: Payer: Self-pay

## 2020-02-23 ENCOUNTER — Ambulatory Visit (HOSPITAL_COMMUNITY): Payer: Medicare Other | Attending: Cardiology

## 2020-02-23 DIAGNOSIS — I252 Old myocardial infarction: Secondary | ICD-10-CM | POA: Diagnosis not present

## 2020-02-26 DIAGNOSIS — E119 Type 2 diabetes mellitus without complications: Secondary | ICD-10-CM | POA: Diagnosis not present

## 2020-02-26 DIAGNOSIS — Z79899 Other long term (current) drug therapy: Secondary | ICD-10-CM | POA: Diagnosis not present

## 2020-02-26 DIAGNOSIS — I10 Essential (primary) hypertension: Secondary | ICD-10-CM | POA: Diagnosis not present

## 2020-02-26 DIAGNOSIS — M48061 Spinal stenosis, lumbar region without neurogenic claudication: Secondary | ICD-10-CM | POA: Diagnosis not present

## 2020-02-26 DIAGNOSIS — M5440 Lumbago with sciatica, unspecified side: Secondary | ICD-10-CM | POA: Diagnosis not present

## 2020-02-27 ENCOUNTER — Other Ambulatory Visit: Payer: Self-pay | Admitting: Physician Assistant

## 2020-03-10 ENCOUNTER — Telehealth: Payer: Self-pay | Admitting: Internal Medicine

## 2020-03-10 DIAGNOSIS — N529 Male erectile dysfunction, unspecified: Secondary | ICD-10-CM | POA: Diagnosis not present

## 2020-03-10 DIAGNOSIS — F331 Major depressive disorder, recurrent, moderate: Secondary | ICD-10-CM | POA: Diagnosis not present

## 2020-03-10 DIAGNOSIS — E1142 Type 2 diabetes mellitus with diabetic polyneuropathy: Secondary | ICD-10-CM | POA: Diagnosis not present

## 2020-03-10 DIAGNOSIS — I951 Orthostatic hypotension: Secondary | ICD-10-CM | POA: Diagnosis not present

## 2020-03-10 DIAGNOSIS — E291 Testicular hypofunction: Secondary | ICD-10-CM | POA: Diagnosis not present

## 2020-03-10 DIAGNOSIS — D485 Neoplasm of uncertain behavior of skin: Secondary | ICD-10-CM | POA: Diagnosis not present

## 2020-03-10 DIAGNOSIS — K219 Gastro-esophageal reflux disease without esophagitis: Secondary | ICD-10-CM | POA: Diagnosis not present

## 2020-03-10 DIAGNOSIS — I10 Essential (primary) hypertension: Secondary | ICD-10-CM | POA: Diagnosis not present

## 2020-03-10 DIAGNOSIS — E7800 Pure hypercholesterolemia, unspecified: Secondary | ICD-10-CM | POA: Diagnosis not present

## 2020-03-10 DIAGNOSIS — E559 Vitamin D deficiency, unspecified: Secondary | ICD-10-CM | POA: Diagnosis not present

## 2020-03-10 DIAGNOSIS — G4733 Obstructive sleep apnea (adult) (pediatric): Secondary | ICD-10-CM | POA: Diagnosis not present

## 2020-03-10 DIAGNOSIS — E1140 Type 2 diabetes mellitus with diabetic neuropathy, unspecified: Secondary | ICD-10-CM | POA: Diagnosis not present

## 2020-03-10 NOTE — Telephone Encounter (Signed)
New Message:   Wife called, she would like the results of pt's Echo from 02-23-20 please.

## 2020-03-10 NOTE — Telephone Encounter (Signed)
Patient's wife (DPR) is aware of echo results.

## 2020-03-11 DIAGNOSIS — I10 Essential (primary) hypertension: Secondary | ICD-10-CM | POA: Diagnosis not present

## 2020-03-11 DIAGNOSIS — E1140 Type 2 diabetes mellitus with diabetic neuropathy, unspecified: Secondary | ICD-10-CM | POA: Diagnosis not present

## 2020-03-11 DIAGNOSIS — Z7984 Long term (current) use of oral hypoglycemic drugs: Secondary | ICD-10-CM | POA: Diagnosis not present

## 2020-03-15 DIAGNOSIS — M5440 Lumbago with sciatica, unspecified side: Secondary | ICD-10-CM | POA: Diagnosis not present

## 2020-03-15 DIAGNOSIS — M5136 Other intervertebral disc degeneration, lumbar region: Secondary | ICD-10-CM | POA: Diagnosis not present

## 2020-03-15 DIAGNOSIS — M48061 Spinal stenosis, lumbar region without neurogenic claudication: Secondary | ICD-10-CM | POA: Diagnosis not present

## 2020-03-15 DIAGNOSIS — Z79899 Other long term (current) drug therapy: Secondary | ICD-10-CM | POA: Diagnosis not present

## 2020-03-15 DIAGNOSIS — I2510 Atherosclerotic heart disease of native coronary artery without angina pectoris: Secondary | ICD-10-CM | POA: Diagnosis not present

## 2020-03-23 DIAGNOSIS — D0461 Carcinoma in situ of skin of right upper limb, including shoulder: Secondary | ICD-10-CM | POA: Diagnosis not present

## 2020-03-30 DIAGNOSIS — L570 Actinic keratosis: Secondary | ICD-10-CM | POA: Diagnosis not present

## 2020-04-22 DIAGNOSIS — M5136 Other intervertebral disc degeneration, lumbar region: Secondary | ICD-10-CM | POA: Diagnosis not present

## 2020-04-22 DIAGNOSIS — I2510 Atherosclerotic heart disease of native coronary artery without angina pectoris: Secondary | ICD-10-CM | POA: Diagnosis not present

## 2020-04-22 DIAGNOSIS — M48061 Spinal stenosis, lumbar region without neurogenic claudication: Secondary | ICD-10-CM | POA: Diagnosis not present

## 2020-04-22 DIAGNOSIS — M5440 Lumbago with sciatica, unspecified side: Secondary | ICD-10-CM | POA: Diagnosis not present

## 2020-04-22 DIAGNOSIS — Z79899 Other long term (current) drug therapy: Secondary | ICD-10-CM | POA: Diagnosis not present

## 2020-05-13 ENCOUNTER — Other Ambulatory Visit: Payer: Self-pay | Admitting: Cardiovascular Disease

## 2020-05-20 DIAGNOSIS — Z79899 Other long term (current) drug therapy: Secondary | ICD-10-CM | POA: Diagnosis not present

## 2020-05-20 DIAGNOSIS — M5440 Lumbago with sciatica, unspecified side: Secondary | ICD-10-CM | POA: Diagnosis not present

## 2020-05-20 DIAGNOSIS — M48061 Spinal stenosis, lumbar region without neurogenic claudication: Secondary | ICD-10-CM | POA: Diagnosis not present

## 2020-05-20 DIAGNOSIS — M545 Low back pain, unspecified: Secondary | ICD-10-CM | POA: Diagnosis not present

## 2020-05-20 DIAGNOSIS — Z23 Encounter for immunization: Secondary | ICD-10-CM | POA: Diagnosis not present

## 2020-05-20 DIAGNOSIS — I2510 Atherosclerotic heart disease of native coronary artery without angina pectoris: Secondary | ICD-10-CM | POA: Diagnosis not present

## 2020-05-26 ENCOUNTER — Other Ambulatory Visit: Payer: Self-pay

## 2020-05-26 ENCOUNTER — Ambulatory Visit (INDEPENDENT_AMBULATORY_CARE_PROVIDER_SITE_OTHER): Payer: Medicare Other | Admitting: Cardiovascular Disease

## 2020-05-26 ENCOUNTER — Encounter: Payer: Self-pay | Admitting: Cardiovascular Disease

## 2020-05-26 VITALS — BP 122/70 | HR 81 | Ht 73.0 in | Wt 248.0 lb

## 2020-05-26 DIAGNOSIS — E782 Mixed hyperlipidemia: Secondary | ICD-10-CM | POA: Diagnosis not present

## 2020-05-26 DIAGNOSIS — I2510 Atherosclerotic heart disease of native coronary artery without angina pectoris: Secondary | ICD-10-CM | POA: Diagnosis not present

## 2020-05-26 DIAGNOSIS — I10 Essential (primary) hypertension: Secondary | ICD-10-CM | POA: Diagnosis not present

## 2020-05-26 MED ORDER — LOSARTAN POTASSIUM 50 MG PO TABS: 50.0000 mg | ORAL_TABLET | Freq: Every day | ORAL | 3 refills | Status: DC

## 2020-05-26 NOTE — Patient Instructions (Signed)
Medication Instructions:  1) STOP SPIRONOLACTONE *If you need a refill on your cardiac medications before your next appointment, please call your pharmacy*  Follow-Up: At [facility], you and your health needs are our priority.  As part of our continuing mission to provide you with exceptional heart care, we have created designated Provider Care Teams.  These Care Teams include your primary Cardiologist (physician) and Advanced Practice Providers (APPs -  Physician Assistants and Nurse Practitioners) who all work together to provide you with the care you need, when you need it. Your next appointment:   12 month(s) The format for your next appointment:   In Person Provider:   You may see Sherren Mocha, MD or one of the following Advanced Practice Providers on your designated Care Team:    Richardson Dopp, PA-C  [place], [place]

## 2020-05-26 NOTE — Progress Notes (Signed)
Cardiology Office Note:    Date:  05/27/2020   ID:  Devin Combs, DOB 26-Feb-1951, MRN [id number]  PCP:  Maury Dus, MD  [facility] Cardiologist:  Sherren Mocha, MD  [place] Electrophysiologist:  None   Referring MD: Maury Dus, MD   Chief Complaint  Patient presents with  . Coronary Artery Disease    History of Present Illness:    Devin Combs is a 69 y.o. male with a hx of coronary artery disease, presenting for follow-up evaluation.  Chronic medical problems include hypertension, mixed hyperlipidemia, type 2 diabetes, obstructive sleep apnea on CPAP, and peripheral neuropathy.  He presented in 2020 with an anterior MI and was treated with PCI of the proximal LAD using a drug-eluting stent.  LVEF was initially reduced at about 35%, but follow-up echo demonstrated normalization of LV function.  Patient is also had some problems with orthostatic intolerance and he has been seen by Dr. Caryl Comes.  His symptoms have largely improved.  He presents today for follow-up evaluation and he is here with his wife.  The patient has been doing fine from a cardiac perspective.  He denies chest pain, chest pressure, or shortness of breath.  He denies leg swelling, orthopnea, or PND.  He has had no lightheadedness or syncope.  He continues to have significant limitation from his back pain.  He has been considering the idea of back surgery with Dr. Rolena Infante.  He has suffered with back pain for many years.  He has no cardiac-related complaints today.  Past Medical History:  Diagnosis Date  . Acute systolic heart failure ([place])   . Anxiety   . Arthritis   . Cancer (HCC)    basal and squamous cell carcinoma  . Chronic back pain   . DDD (degenerative disc disease), thoracolumbar   . Degenerative cervical spinal stenosis   . Depression   . Diabetic autonomic neuropathy ([place])   . Dyspnea    with exertion   . GERD (gastroesophageal reflux disease)   . Headache   . History of  bronchitis   . History of kidney stones   . Hyperlipidemia   . Hypertension   . Hypogonadism in male   . Myocardial infarction ([place])   . Neuropathy   . NSTEMI (non-ST elevated myocardial infarction) ([place])    10/29/2018- PCI/DESx1 to pLAD, EF 35%  . Orthostatic hypotension   . OSA (obstructive sleep apnea)    per pt study done 2006 (approx)  used cpap few yrs then stopped using, stated didn't feel he needed it anymore  . Psoriasis   . Retinal tear   . Right knee meniscal tear   . Type 2 diabetes mellitus ([place])   . Urinary hesitancy   . Vitamin D deficiency     Past Surgical History:  Procedure Laterality Date  . CATARACT EXTRACTION W/ INTRAOCULAR LENS  IMPLANT, BILATERAL  right 2015//  left 2012  . CORONARY/GRAFT ACUTE MI REVASCULARIZATION N/A 10/30/2018   Procedure: Coronary/Graft Acute MI Revascularization;  Surgeon: Troy Sine, MD;  Location: [facility];  Service: Cardiovascular;  Laterality: N/A;  . CORRECTION REPAIR MULTIPLE  TOES , LEFT FOOT  YRS AGO  . EXTRACORPOREAL SHOCK WAVE LITHOTRIPSY  2000 approx  . KNEE ARTHROSCOPY Left 2007  . KNEE ARTHROSCOPY Right 02/10/2015   Procedure: ARTHROSCOPY RIGHT KNEE WITH DEBRIDEMENT AND CHRONDROPLASTY;  Surgeon: Gaynelle Arabian, MD;  Location: [facility];  Service: Orthopedics;  Laterality: Right;  . LEFT HEART CATH AND CORONARY  ANGIOGRAPHY N/A 10/30/2018   Procedure: LEFT HEART CATH AND CORONARY ANGIOGRAPHY;  Surgeon: Troy Sine, MD;  Location: [facility];  Service: Cardiovascular;  Laterality: N/A;  . PARTIAL KNEE ARTHROPLASTY Right 07/19/2016   Procedure: RIGHT KNEE MEDICAL UNICOMPARTMENTAL ARTHROPLASTY;  Surgeon: Gaynelle Arabian, MD;  Location: [facility];  Service: Orthopedics;  Laterality: Right;  . RADICAL ORCHIECTOMY Left 1996   benign  . REMOVAL SPURS RIGHT GREAT TOE  YRS AGO  . TOTAL KNEE ARTHROPLASTY Left 09-16-2007    Current Medications: Current Meds  Medication Sig  .  amphetamine-dextroamphetamine (ADDERALL XR) 30 MG 24 hr capsule Take 30 mg by mouth daily.  Marland Kitchen Apoaequorin (PREVAGEN) 10 MG CAPS Take 10 mg by mouth daily.   Marland Kitchen aspirin EC 81 MG EC tablet Take 1 tablet (81 mg total) by mouth daily.  Marland Kitchen atorvastatin (LIPITOR) 80 MG tablet TAKE 1 TABLET BY MOUTH EVERY DAY AT 6PM  . buPROPion (WELLBUTRIN XL) 300 MG 24 hr tablet Take 300 mg by mouth daily.  . carvedilol (COREG) 6.25 MG tablet TAKE 1 TABLET (6.25 MG TOTAL) BY MOUTH 2 (TWO) TIMES DAILY WITH A MEAL.  Marland Kitchen clopidogrel (PLAVIX) 75 MG tablet TAKE 1 TABLET (75 MG TOTAL) BY MOUTH DAILY. PLEASE KEEP UPCOMING APPT IN OCTOBER WITH DR. Burt Knack BEFORE ANYMORE REFILLS. THANK YOU  . DULoxetine (CYMBALTA) 60 MG capsule Take 60 mg by mouth every evening.  . fentaNYL (DURAGESIC) 75 MCG/HR Place 1 patch onto the skin every 3 (three) days.  . folic acid (FOLVITE) 1 MG tablet Take 1 mg by mouth daily.  . furosemide (LASIX) 20 MG tablet TAKE 1 TABLET BY MOUTH THREE TIMES A DAY AS NEEDED BETWEEN MEALS AND AT BEDTIME  . HYDROcodone-acetaminophen (NORCO) 10-325 MG tablet Take 1 tablet by mouth 4 (four) times daily.   Marland Kitchen Hyoscyamine Sulfate SL 0.125 MG SUBL Place 1 tablet under the tongue as needed for constipation.  Marland Kitchen losartan (COZAAR) 50 MG tablet Take 1 tablet (50 mg total) by mouth daily.  . metFORMIN (GLUCOPHAGE) 1000 MG tablet Take 1,000 mg by mouth 2 (two) times daily with a meal.  . nitroGLYCERIN (NITROSTAT) 0.4 MG SL tablet Place 1 tablet (0.4 mg total) under the tongue every 5 (five) minutes x 3 doses as needed for chest pain.  Marland Kitchen oxymetazoline (AFRIN) 0.05 % nasal spray Place 1 spray into both nostrils 2 (two) times daily as needed for congestion.  . pioglitazone (ACTOS) 30 MG tablet Take 30 mg by mouth every morning.  . ranitidine (ZANTAC) 150 MG capsule Take 150 mg by mouth daily as needed for heartburn.  . Testosterone 20.25 MG/ACT (1.62%) GEL daily.  . TRULICITY 1.5 DT/2.6ZT SOPN Inject 0.5 mLs into the skin every  Saturday.   . valACYclovir (VALTREX) 1000 MG tablet Take 1,000 mg by mouth daily as needed (outbreaks).   . [DISCONTINUED] losartan (COZAAR) 50 MG tablet Take 1 tablet (50 mg total) by mouth daily. Patient is overdue for an appointment and will need to call and schedule for further refills 2nd attempt  . [DISCONTINUED] spironolactone (ALDACTONE) 25 MG tablet TAKE 1 TABLET (25 MG TOTAL) BY MOUTH AT BEDTIME. PLEASE MAKE OVERDUE APPT WITH DR. Burt Knack BEFORE ANYMORE REFILLS. 1ST ATTEMPT     Allergies:   Patient has no known allergies.   Social History   Socioeconomic History  . Marital status: Married    Spouse name: Not on file  . Number of children: 1  . Years of education: Not on file  .  Highest education level: Some college, no degree  Occupational History  . Not on file  Tobacco Use  . Smoking status: Never Smoker  . Smokeless tobacco: Never Used  Vaping Use  . Vaping Use: Never used  Substance and Sexual Activity  . Alcohol use: Yes    Comment: rare  . Drug use: No  . Sexual activity: Not on file  Other Topics Concern  . Not on file  Social History Narrative   Lives at home with wife, who is Therapist, sports.  Pt is retired.  Caffeine 2-3 cups per week. Right handed.    Social Determinants of Health   Financial Resource Strain:   . Difficulty of Paying Living Expenses: Not on file  Food Insecurity:   . Worried About Charity fundraiser in the Last Year: Not on file  . Ran Out of Food in the Last Year: Not on file  Transportation Needs:   . Lack of Transportation (Medical): Not on file  . Lack of Transportation (Non-Medical): Not on file  Physical Activity:   . Days of Exercise per Week: Not on file  . Minutes of Exercise per Session: Not on file  Stress:   . Feeling of Stress : Not on file  Social Connections:   . Frequency of Communication with Friends and Family: Not on file  . Frequency of Social Gatherings with Friends and Family: Not on file  . Attends Religious Services:  Not on file  . Active Member of Clubs or Organizations: Not on file  . Attends Archivist Meetings: Not on file  . Marital Status: Not on file     Family History: The patient's family history is negative for Neuropathy.  ROS:   Please see the history of present illness.    All other systems reviewed and are negative.  EKGs/Labs/Other Studies Reviewed:    The following studies were reviewed today: Echo 02-23-2020: IMPRESSIONS    1. Since the last study on 10/30/2018 LVEF has improved from 35-40% to  55-60%.  2. Left ventricular ejection fraction, by estimation, is 55 to 60%. The  left ventricle has normal function. The left ventricle has no regional  wall motion abnormalities. There is mild concentric left ventricular  hypertrophy. Left ventricular diastolic  parameters are consistent with Grade I diastolic dysfunction (impaired  relaxation). Elevated left atrial pressure.  3. Right ventricular systolic function is normal. The right ventricular  size is normal.  4. Left atrial size was moderately dilated.  5. The mitral valve is normal in structure. Mild mitral valve  regurgitation. No evidence of mitral stenosis.  6. The aortic valve is normal in structure. Aortic valve regurgitation is  not visualized. Mild to moderate aortic valve sclerosis/calcification is  present, without any evidence of aortic stenosis.  7. The inferior vena cava is normal in size with greater than 50%  respiratory variability, suggesting right atrial pressure of 3 mmHg.   Cardiac Cath 10-30-2018: Conclusion    1st Diag lesion is 70% stenosed.  Prox LAD lesion is 100% stenosed.  Prox Cx lesion is 10% stenosed.  Dist Cx lesion is 20% stenosed.  Prox RCA to Mid RCA lesion is 10% stenosed.  Post Atrio lesion is 50% stenosed.  Mid LAD lesion is 95% stenosed.  Post intervention, there is a 0% residual stenosis.  A drug-eluting stent was successfully placed.  Post  intervention, there is a 0% residual stenosis.  LV end diastolic pressure is mildly elevated.  There is mild  to moderate left ventricular systolic dysfunction.  A stent was successfully placed.   Acute coronary syndrome secondary to total occlusion of the very proximal LAD immediately after a takeoff of a very proximal, near ostial diagonal vessel with initial TIMI 0 flow.  Mild concomitant CAD with 10% mid and 20% distal circumflex stenoses and 10% mid RCA stenosis with 50% distal focal narrowing in the continuation branch prior to the PLA takeoff.  There were minimal collaterals to the LAD.  Mild to moderate acute LV dysfunction with an EF of 40 to 45% and hypocontractility involving the mid distal anterolateral wall and apex.  LVEDP 20 mmHg.  Difficult but successful percutaneous coronary intervention of the very proximal LAD occlusion with ultimate insertion of a 3.0 x 38 mm Resolute Onyx DES stent at the site of very proximal total occlusion and insertion of a 2.5 x 22 mm Resolute stent in the mid LAD with the 95% stenosis being reduced to 0%.  There was initially no flow phenomena.  The patient was treated with Aggrastat infusion in addition to bivalirudin and Brilinta and received numerous doses of intracoronary nitroglycerin.  RECOMMENDATION: DAPT for minimum of 1 year.  We will cycle troponins.  Will admit the patient to to heart following his catheterization.  Optimal blood pressure control with ideal blood pressure less than 120/80.  Aggressive high potency lipid lowering therapy with target LDL less than 70.    EKG:  EKG is not ordered today.   Recent Labs: No results found for requested labs within last 8760 hours.  Recent Lipid Panel    Component Value Date/Time   CHOL 216 (H) 10/30/2018 0257   TRIG 133 10/30/2018 0257   HDL 36 (L) 10/30/2018 0257   CHOLHDL 6.0 10/30/2018 0257   VLDL 27 10/30/2018 0257   LDLCALC 153 (H) 10/30/2018 0257     Risk  Assessment/Calculations:     Physical Exam:    VS:  BP 122/70   Pulse 81   Ht 6\' 1"  (1.854 m)   Wt 248 lb (112.5 kg)   SpO2 97%   BMI 32.72 kg/m     Wt Readings from Last 3 Encounters:  05/26/20 248 lb (112.5 kg)  02/02/20 254 lb (115.2 kg)  11/14/18 252 lb (114.3 kg)     GEN:  Well nourished, well developed in no acute distress HEENT: Normal NECK: No JVD; No carotid bruits LYMPHATICS: No lymphadenopathy CARDIAC: RRR, no murmurs, rubs, gallops RESPIRATORY:  Clear to auscultation without rales, wheezing or rhonchi  ABDOMEN: Soft, non-tender, non-distended MUSCULOSKELETAL:  No edema; No deformity. Bilateral TKA scars SKIN: Warm and dry NEUROLOGIC:  Alert and oriented x 3 PSYCHIATRIC:  Normal affect   ASSESSMENT:    1. Coronary artery disease involving native coronary artery of native heart without angina pectoris   2. Mixed hyperlipidemia   3. Essential hypertension    PLAN:    In order of problems listed above:  1. Stable without symptoms of angina.  Advised him to continue on aspirin and clopidogrel and an absence of any bleeding/bruising problems as his stent is located in the proximal LAD.  I will probably have him discontinue clopidogrel next year when he returns for follow-up.  If he ultimately requires back surgery which she is contemplating, he would be at acceptable risk to interrupt antiplatelet therapy for surgery now that he is greater than 12 months out from his PCI procedure.  He understands that it is a very good prognostic sign that his  LV function has normalized with revascularization. 2. Treated with atorvastatin 80 mg daily.  Most recent labs with a cholesterol 130, LDL 70 mg/dL.  ALT was normal at 19. 3. Blood pressure well controlled.  He should continue losartan and carvedilol.  Advised him to discontinue spironolactone since his LV function is normal and he has no symptoms of heart failure.   Medication Adjustments/Labs and Tests Ordered: Current  medicines are reviewed at length with the patient today.  Concerns regarding medicines are outlined above.  No orders of the defined types were placed in this encounter.  Meds ordered this encounter  Medications  . losartan (COZAAR) 50 MG tablet    Sig: Take 1 tablet (50 mg total) by mouth daily.    Dispense:  90 tablet    Refill:  3    Patient Instructions  Medication Instructions:  1) STOP SPIRONOLACTONE *If you need a refill on your cardiac medications before your next appointment, please call your pharmacy*  Follow-Up: At [facility], you and your health needs are our priority.  As part of our continuing mission to provide you with exceptional heart care, we have created designated Provider Care Teams.  These Care Teams include your primary Cardiologist (physician) and Advanced Practice Providers (APPs -  Physician Assistants and Nurse Practitioners) who all work together to provide you with the care you need, when you need it. Your next appointment:   12 month(s) The format for your next appointment:   In Person Provider:   You may see Sherren Mocha, MD or one of the following Advanced Practice Providers on your designated Care Team:    Richardson Dopp, PA-C  [place], [place]      Signed, Sherren Mocha, MD  05/27/2020 2:56 PM    [place]

## 2020-05-27 ENCOUNTER — Encounter: Payer: Self-pay | Admitting: Cardiovascular Disease

## 2020-06-02 DIAGNOSIS — E1142 Type 2 diabetes mellitus with diabetic polyneuropathy: Secondary | ICD-10-CM | POA: Diagnosis not present

## 2020-06-02 DIAGNOSIS — E7800 Pure hypercholesterolemia, unspecified: Secondary | ICD-10-CM | POA: Diagnosis not present

## 2020-06-02 DIAGNOSIS — I10 Essential (primary) hypertension: Secondary | ICD-10-CM | POA: Diagnosis not present

## 2020-06-02 DIAGNOSIS — E1140 Type 2 diabetes mellitus with diabetic neuropathy, unspecified: Secondary | ICD-10-CM | POA: Diagnosis not present

## 2020-06-02 DIAGNOSIS — E1141 Type 2 diabetes mellitus with diabetic mononeuropathy: Secondary | ICD-10-CM | POA: Diagnosis not present

## 2020-06-02 DIAGNOSIS — F331 Major depressive disorder, recurrent, moderate: Secondary | ICD-10-CM | POA: Diagnosis not present

## 2020-06-02 DIAGNOSIS — K219 Gastro-esophageal reflux disease without esophagitis: Secondary | ICD-10-CM | POA: Diagnosis not present

## 2020-06-10 DIAGNOSIS — S90221A Contusion of right lesser toe(s) with damage to nail, initial encounter: Secondary | ICD-10-CM | POA: Diagnosis not present

## 2020-06-10 DIAGNOSIS — D0422 Carcinoma in situ of skin of left ear and external auricular canal: Secondary | ICD-10-CM | POA: Diagnosis not present

## 2020-06-10 DIAGNOSIS — L905 Scar conditions and fibrosis of skin: Secondary | ICD-10-CM | POA: Diagnosis not present

## 2020-06-10 DIAGNOSIS — B351 Tinea unguium: Secondary | ICD-10-CM | POA: Diagnosis not present

## 2020-06-10 DIAGNOSIS — D485 Neoplasm of uncertain behavior of skin: Secondary | ICD-10-CM | POA: Diagnosis not present

## 2020-06-10 DIAGNOSIS — L570 Actinic keratosis: Secondary | ICD-10-CM | POA: Diagnosis not present

## 2020-06-18 DIAGNOSIS — Z79899 Other long term (current) drug therapy: Secondary | ICD-10-CM | POA: Diagnosis not present

## 2020-06-18 DIAGNOSIS — I2510 Atherosclerotic heart disease of native coronary artery without angina pectoris: Secondary | ICD-10-CM | POA: Diagnosis not present

## 2020-06-18 DIAGNOSIS — E669 Obesity, unspecified: Secondary | ICD-10-CM | POA: Diagnosis not present

## 2020-06-18 DIAGNOSIS — M5440 Lumbago with sciatica, unspecified side: Secondary | ICD-10-CM | POA: Diagnosis not present

## 2020-06-18 DIAGNOSIS — M5136 Other intervertebral disc degeneration, lumbar region: Secondary | ICD-10-CM | POA: Diagnosis not present

## 2020-06-21 DIAGNOSIS — Z23 Encounter for immunization: Secondary | ICD-10-CM | POA: Diagnosis not present

## 2020-06-29 DIAGNOSIS — E1140 Type 2 diabetes mellitus with diabetic neuropathy, unspecified: Secondary | ICD-10-CM | POA: Diagnosis not present

## 2020-06-29 DIAGNOSIS — I10 Essential (primary) hypertension: Secondary | ICD-10-CM | POA: Diagnosis not present

## 2020-06-29 DIAGNOSIS — F331 Major depressive disorder, recurrent, moderate: Secondary | ICD-10-CM | POA: Diagnosis not present

## 2020-06-29 DIAGNOSIS — K219 Gastro-esophageal reflux disease without esophagitis: Secondary | ICD-10-CM | POA: Diagnosis not present

## 2020-06-29 DIAGNOSIS — E1142 Type 2 diabetes mellitus with diabetic polyneuropathy: Secondary | ICD-10-CM | POA: Diagnosis not present

## 2020-06-29 DIAGNOSIS — E1141 Type 2 diabetes mellitus with diabetic mononeuropathy: Secondary | ICD-10-CM | POA: Diagnosis not present

## 2020-06-29 DIAGNOSIS — E7800 Pure hypercholesterolemia, unspecified: Secondary | ICD-10-CM | POA: Diagnosis not present

## 2020-07-01 DIAGNOSIS — J343 Hypertrophy of nasal turbinates: Secondary | ICD-10-CM | POA: Diagnosis not present

## 2020-07-01 DIAGNOSIS — J310 Chronic rhinitis: Secondary | ICD-10-CM | POA: Diagnosis not present

## 2020-07-01 DIAGNOSIS — H838X3 Other specified diseases of inner ear, bilateral: Secondary | ICD-10-CM | POA: Diagnosis not present

## 2020-07-01 DIAGNOSIS — H903 Sensorineural hearing loss, bilateral: Secondary | ICD-10-CM | POA: Diagnosis not present

## 2020-07-19 DIAGNOSIS — M5136 Other intervertebral disc degeneration, lumbar region: Secondary | ICD-10-CM | POA: Diagnosis not present

## 2020-07-19 DIAGNOSIS — Z79899 Other long term (current) drug therapy: Secondary | ICD-10-CM | POA: Diagnosis not present

## 2020-07-19 DIAGNOSIS — E1165 Type 2 diabetes mellitus with hyperglycemia: Secondary | ICD-10-CM | POA: Diagnosis not present

## 2020-07-19 DIAGNOSIS — M5440 Lumbago with sciatica, unspecified side: Secondary | ICD-10-CM | POA: Diagnosis not present

## 2020-07-19 DIAGNOSIS — I10 Essential (primary) hypertension: Secondary | ICD-10-CM | POA: Diagnosis not present

## 2020-07-21 ENCOUNTER — Other Ambulatory Visit: Payer: Self-pay | Admitting: Cardiovascular Disease

## 2020-10-31 ENCOUNTER — Other Ambulatory Visit: Payer: Self-pay | Admitting: Cardiovascular Disease

## 2021-02-25 ENCOUNTER — Other Ambulatory Visit: Payer: Self-pay

## 2021-02-25 ENCOUNTER — Ambulatory Visit (HOSPITAL_BASED_OUTPATIENT_CLINIC_OR_DEPARTMENT_OTHER): Payer: Medicare Other | Attending: Orthopedic Surgery | Admitting: Physical Therapy

## 2021-02-25 ENCOUNTER — Encounter (HOSPITAL_BASED_OUTPATIENT_CLINIC_OR_DEPARTMENT_OTHER): Payer: Self-pay | Admitting: Physical Therapy

## 2021-02-25 DIAGNOSIS — G8929 Other chronic pain: Secondary | ICD-10-CM

## 2021-02-25 DIAGNOSIS — M545 Low back pain, unspecified: Secondary | ICD-10-CM | POA: Diagnosis present

## 2021-02-25 DIAGNOSIS — M542 Cervicalgia: Secondary | ICD-10-CM | POA: Diagnosis present

## 2021-02-25 DIAGNOSIS — M6281 Muscle weakness (generalized): Secondary | ICD-10-CM | POA: Diagnosis present

## 2021-02-25 DIAGNOSIS — R2689 Other abnormalities of gait and mobility: Secondary | ICD-10-CM

## 2021-02-28 ENCOUNTER — Encounter (HOSPITAL_BASED_OUTPATIENT_CLINIC_OR_DEPARTMENT_OTHER): Payer: Self-pay | Admitting: Physical Therapy

## 2021-02-28 ENCOUNTER — Ambulatory Visit (HOSPITAL_BASED_OUTPATIENT_CLINIC_OR_DEPARTMENT_OTHER): Payer: Medicare Other | Admitting: Physical Therapy

## 2021-02-28 ENCOUNTER — Other Ambulatory Visit: Payer: Self-pay

## 2021-02-28 DIAGNOSIS — R2689 Other abnormalities of gait and mobility: Secondary | ICD-10-CM

## 2021-02-28 DIAGNOSIS — M545 Low back pain, unspecified: Secondary | ICD-10-CM

## 2021-02-28 DIAGNOSIS — G8929 Other chronic pain: Secondary | ICD-10-CM

## 2021-02-28 DIAGNOSIS — M6281 Muscle weakness (generalized): Secondary | ICD-10-CM

## 2021-02-28 DIAGNOSIS — M542 Cervicalgia: Secondary | ICD-10-CM

## 2021-02-28 NOTE — Therapy (Signed)
[facility] [address] [place], [place], [address] Phone: [phone]   Fax:  [phone]  Physical Therapy Evaluation  Patient Details  Name: Devin Combs MRN: [id number] Date of Birth: [birth year] Referring Provider (PT): Melina Schools, MD   Encounter Date: 02/25/2021   PT End of Session - 02/28/21 1507     Visit Number 1    Number of Visits 6    PT Start Time 2542    PT Stop Time 1627    PT Time Calculation (min) 44 min    Equipment Utilized During Treatment Gait belt    Activity Tolerance Patient tolerated treatment well    Behavior During Therapy WFL for tasks assessed/performed             Past Medical History:  Diagnosis Date   Acute systolic heart failure ([place])    Anxiety    Arthritis    Cancer ([place])    basal and squamous cell carcinoma   Chronic back pain    DDD (degenerative disc disease), thoracolumbar    Degenerative cervical spinal stenosis    Depression    Diabetic autonomic neuropathy ([place])    Dyspnea    with exertion    GERD (gastroesophageal reflux disease)    Headache    History of bronchitis    History of kidney stones    Hyperlipidemia    Hypertension    Hypogonadism in male    Myocardial infarction ([place])    Neuropathy    NSTEMI (non-ST elevated myocardial infarction) ([place])    10/29/2018- PCI/DESx1 to pLAD, EF 35%   Orthostatic hypotension    OSA (obstructive sleep apnea)    per pt study done 2006 (approx)  used cpap few yrs then stopped using, stated didn't feel he needed it anymore   Psoriasis    Retinal tear    Right knee meniscal tear    Type 2 diabetes mellitus ([place])    Urinary hesitancy    Vitamin D deficiency     Past Surgical History:  Procedure Laterality Date   CATARACT EXTRACTION W/ INTRAOCULAR LENS  IMPLANT, BILATERAL  right 2015//  left 2012   CORONARY/GRAFT ACUTE MI REVASCULARIZATION N/A 10/30/2018   Procedure: Coronary/Graft Acute MI Revascularization;   Surgeon: Troy Sine, MD;  Location: [facility];  Service: Cardiovascular;  Laterality: N/A;   CORRECTION REPAIR MULTIPLE  TOES , LEFT FOOT  YRS AGO   EXTRACORPOREAL SHOCK WAVE LITHOTRIPSY  2000 approx   KNEE ARTHROSCOPY Left 2007   KNEE ARTHROSCOPY Right 02/10/2015   Procedure: ARTHROSCOPY RIGHT KNEE WITH DEBRIDEMENT AND CHRONDROPLASTY;  Surgeon: Gaynelle Arabian, MD;  Location: [facility];  Service: Orthopedics;  Laterality: Right;   LEFT HEART CATH AND CORONARY ANGIOGRAPHY N/A 10/30/2018   Procedure: LEFT HEART CATH AND CORONARY ANGIOGRAPHY;  Surgeon: Troy Sine, MD;  Location: [facility];  Service: Cardiovascular;  Laterality: N/A;   PARTIAL KNEE ARTHROPLASTY Right 07/19/2016   Procedure: RIGHT KNEE MEDICAL UNICOMPARTMENTAL ARTHROPLASTY;  Surgeon: Gaynelle Arabian, MD;  Location: [facility];  Service: Orthopedics;  Laterality: Right;   RADICAL ORCHIECTOMY Left 1996   benign   REMOVAL SPURS RIGHT GREAT TOE  YRS AGO   TOTAL KNEE ARTHROPLASTY Left 09-16-2007    There were no vitals filed for this visit.    Subjective Assessment - 02/28/21 1507     Subjective Patient reports with low back pain that is constant with movement. Patient is unable to sit, stand, and  walk for long period of time (5-77mins max) due to pain. Imaging from 2018 shows L4-L5 bilateral facet athropathy with 77mm anterolisthesis (see below in hx). Patient is interested in aquatic therapy and wants to increase strenght in lower extremity. Patient also reports upper back, and neck pain but main concern is low back pain and weakness.    Patient is accompained by: Family member    Pertinent History trouble sleeping, heart attack 2019, diabetes type 2, L4-5: Bilateral facet arthropathy with 7 mm anterolisthesis. Disc  degeneration with annular fissures and circumferential protrusion of  the disc. Stenosis of both lateral recesses and neural foramina that  could cause neural compression on either or  both sides.    L5-S1: Disc degeneration with shallow disc protrusion but no  compressive effect upon the thecal sac or S1 nerves.    Limitations Sitting;Walking;Lifting;Standing    How long can you stand comfortably? limited 5-10,    How long can you walk comfortably? limited    Diagnostic tests fusion l4-l5 plan for patient.    Patient Stated Goals return to tennis, golf    Currently in Pain? Yes    Pain Score 7     Pain Location Back    Pain Orientation Right;Left    Pain Descriptors / Indicators Aching    Pain Type Chronic pain    Pain Onset More than a month ago    Pain Frequency Constant    Aggravating Factors  bending, standing, and walking    Pain Relieving Factors laying down on the aide    Effect of Pain on Daily Activities difficulty walking    Pain Onset More than a month ago                [facility] PT Assessment - 02/28/21 0001       Assessment   Medical Diagnosis Low back pain: denerative    Referring Provider (PT) Melina Schools, MD    Hand Dominance Right    Prior Therapy 2018 aquatic therapy,      Precautions   Precautions Fall      Restrictions   Weight Bearing Restrictions No      Balance Screen   Has the patient fallen in the past 6 months Yes    How many times? 4    Has the patient had a decrease in activity level because of a fear of falling?  Yes    Is the patient reluctant to leave their home because of a fear of falling?  Yes      [place] Private residence    Living Arrangements Spouse/significant other    Type of [place] Access Level entry    [place] - quad      Prior Function   Vocation Retired    Leisure golf, tennis, bike      Cognition   Overall Cognitive Status Within Functional Limits for tasks assessed    Attention Focused    Focused Attention Appears intact    Memory Appears intact    Awareness Appears intact    Problem Solving Appears intact      Sensation   Light  Touch Appears Intact    Additional Comments bilateral tingling in the feet      Coordination   Gross Motor Movements are Fluid and Coordinated Yes    Fine Motor Movements are Fluid and Coordinated Yes      Posture/Postural Control  Posture/Postural Control Postural limitations    Postural Limitations Rounded Shoulders;Forward head;Increased thoracic kyphosis      ROM / Strength   AROM / PROM / Strength AROM;PROM;Strength      AROM   Overall AROM Comments within normal limits in hip    AROM Assessment Site Cervical;Lumbar;Hip    Right/Left Hip Right    Right Hip Flexion 90    Cervical Flexion 40    Cervical Extension 20    Cervical - Right Rotation 40    Cervical - Left Rotation 35    Lumbar Flexion limited by pain    Lumbar - Right Side Bend limited by pain 70% of N    Lumbar - Left Side Bend limited by pain 70% of N    Lumbar - Right Rotation limited 85% of N    Lumbar - Left Rotation limited 85% of N      Strength   Strength Assessment Site Hip;Knee    Right/Left Hip Right;Left    Right Hip Flexion 3+/5    Right Hip ABduction 3+/5    Right Hip ADduction 4/5    Left Hip Flexion 3+/5    Left Hip ABduction 3+/5    Left Hip ADduction 4+/5    Right Knee Flexion 4-/5    Right Knee Extension 4-/5    Left Knee Flexion 4-/5    Left Knee Extension 4-/5      Palpation   Palpation comment palpation had some tightness near L4/L5, erector spinae, glutes. Overall not tender to palpation      Transfers   Transfers Sit to Stand;Stand to Sit    Sit to Stand 5: Supervision    Stand to Sit 5: Supervision    Comments use of arms for support for standing and sitting      Ambulation/Gait   Gait Comments decrease swing phase, small cadence, decreased hip flexion during ambulation, deficits overall in gait and mobility      Balance   Balance Assessed Yes   [place]: eye open (30sec contact guard), eyes closed (15 sec min assist), tandem: 15 sec - mod assist w eyes open                        Objective measurements completed on examination: See above findings.       [facility] Adult PT Treatment/Exercise - 02/28/21 0001       Exercises   Exercises Lumbar      Lumbar Exercises: Seated   Other Seated Lumbar Exercises 2x10 banded seated hip abdcution, 2x10 seated banded hip flexion, 2x10 seated banded knee extension; all exercises done in yellow band                    PT Education - 02/28/21 1416     Education Details Reviewed intial HEP and what physical therapy will entail.    Person(s) Educated Patient;Spouse    Methods Explanation;Demonstration;Tactile cues;Verbal cues    Comprehension Verbalized understanding;Returned demonstration;Verbal cues required;Tactile cues required              PT Short Term Goals - 02/28/21 1505       PT SHORT TERM GOAL #1   Title Patient will be indpendnet with basic pool program    Time 3    Period Weeks    Status New    Target Date 03/21/21      PT SHORT TERM GOAL #2   Title Patient will demonstrate 4+/5  gross bilateral LE strength    Time 3    Period Weeks    Status New    Target Date 03/21/21               PT Long Term Goals - 02/28/21 1506       PT LONG TERM GOAL #1   Title Patient will improve gait mechanics in order to amblate 700 feet with decreased fatigue in order to ambulate in the community.    Baseline limited walking    Time 4    Period Weeks    Status New    Target Date 03/28/21      PT LONG TERM GOAL #2   Title Patient will increase lower extremity strength in bilateral hips and knee in order to improve transfers from sitting to standing, and standing to sitting.    Baseline 3+/5 throughout bilateral hip (except adduction 4/5), 4-/5 throughout bilateral knee    Time 4    Period Weeks    Status New    Target Date 03/28/21      PT LONG TERM GOAL #3   Title Patient will improve lumbar and cervical range of motion by 10% in order to perform ADL's safely.     Baseline limited cervical rotation 35 Lt rotation; Rt rotation 40, lumbar rotation 85%, side bending 70%.    Time 4    Period Weeks    Status New    Target Date 03/28/21      PT LONG TERM GOAL #4   Title Patient will be independent with final HEP and demonstrate understanding of correct form with exercises and stretches.    Baseline Inital HEP    Time 4    Period Weeks    Status New                    Plan - 02/28/21 1508     Clinical Impression Statement Patient presents with bilateral low back pain with no sciatica radiating secondary to degenerative disk disease of lumbar/sacral spine. Imaging from 2019 shows patient has L4-L5 bilateral facet arthropathy, disc degeneration, and stenosis. Previous imaging from 2018 shows L5-S1 disc degeneration but no compressive effective on S1 nerves. Patient presents with lower extremity weakness, decreased range of motion in lumbar and cervical movements, and deficits in gait and balance. During therapy patient was educated on initial HEP (seated clamshells, seated hip flexion, seated knee extension) and improving strength. Patient tolerated treated with no increase in pain but felt weak. Therapy will focus on improving strength of LE, balance and gait, and improving range of motion in lumbar and cervical region.    Personal Factors and Comorbidities Comorbidity 1;Comorbidity 2    Comorbidities myocardial infarction HCC, diabetes type 2, dysautonomia, orthostatic hypotension (patient states it has not occured in over a year)    Examination-Activity Limitations Bathing;Bend;Carry;Stairs;Stand;Transfers    Examination-Participation Restrictions Cleaning;Community Activity;Yard Work;Other    Stability/Clinical Decision Making Evolving/Moderate complexity    Rehab Potential Good    PT Frequency 2x / week    PT Duration 4 weeks    PT Treatment/Interventions ADLs/Self Care Home Management;Aquatic Therapy;Contrast Bath;Gait training;Stair  training;Electrical Stimulation;Cryotherapy;Functional mobility training;Therapeutic activities;Therapeutic exercise;Balance training;Traction;Moist Heat;Manual techniques;Taping;Energy conservation;Dry needling;Neuromuscular re-education;Passive range of motion;Patient/family education;Joint Manipulations    PT Next Visit Plan Aquatic therapy intro, improve gait mechanics and improve balance by increasing core strenght and LE strength, progress HEP to improve lower extremity strength in hips and knees, manuel therapy to reduce upper trap tightness;  lower back tightness..    PT Home Exercise Plan 3x10 banded seated hip abdcution, 3x10 seated banded hip flexion, 3x10 seated banded knee extension; all exercises done in yellow band    Consulted and Agree with Plan of Care Patient;Family member/caregiver    Family Member Consulted wife             Patient will benefit from skilled therapeutic intervention in order to improve the following deficits and impairments:  Abnormal gait, Decreased activity tolerance, Decreased balance, Decreased coordination, Decreased range of motion, Decreased mobility, Decreased endurance, Decreased strength, Difficulty walking, Hypomobility, Impaired sensation, Pain, Impaired UE functional use, Postural dysfunction  Visit Diagnosis: Chronic bilateral low back pain without sciatica  Other abnormalities of gait and mobility  Muscle weakness (generalized)  Cervicalgia     Problem List Patient Active Problem List   Diagnosis Date Noted   Orthostatic hypotension 01/30/2020   Hypertension 11/01/2018   Hyperlipidemia 61/95/0932   Acute systolic heart failure ([place]) 11/01/2018   NSTEMI (non-ST elevated myocardial infarction) ([place]) 10/29/2018   Complex sleep apnea syndrome 07/10/2017   Treatment-emergent central sleep apnea 07/10/2017   Myocardial infarction ([place]) 07/10/2017   Excessive daytime sleepiness 05/31/2017   Snoring 05/31/2017   Coronary artery disease  involving native coronary artery of native heart without angina pectoris 05/31/2017   Intermittent palpitations 05/31/2017   Perspiration-excessive 05/31/2017   Dysautonomia orthostatic hypotension syndrome 05/31/2017   OA (osteoarthritis) of knee 07/19/2016   Acute medial meniscal tear 02/10/2015    Carney Living PT DPT  02/28/2021, 3:10 PM   Davis Gourd  02/28/2021   During this treatment session, the therapist was present, participating in and directing the treatment.  [facility] [address] [place], [place], [address] Phone: [phone]   Fax:  [phone]  Name: Devin Combs MRN: [id number] Date of Birth: Mar 01, 1951

## 2021-02-28 NOTE — Addendum Note (Signed)
Addended by: Carney Living on: 02/28/2021 03:16 PM   Modules accepted: Orders

## 2021-02-28 NOTE — Therapy (Deleted)
[facility] [address] [place], [place], [address] Phone: [phone]   Fax:  [phone]  Physical Therapy Treatment  Patient Details  Name: Devin Combs MRN: [id number] Date of Birth: Apr 06, 1951 Referring Provider (PT): Melina Schools, MD   Encounter Date: 02/25/2021   PT End of Session - 02/28/21 1507     Visit Number 1    Number of Visits 6    PT Start Time 1543    PT Stop Time 1627    PT Time Calculation (min) 44 min    Equipment Utilized During Treatment Gait belt    Activity Tolerance Patient tolerated treatment well    Behavior During Therapy WFL for tasks assessed/performed             Past Medical History:  Diagnosis Date   Acute systolic heart failure ([place])    Anxiety    Arthritis    Cancer ([place])    basal and squamous cell carcinoma   Chronic back pain    DDD (degenerative disc disease), thoracolumbar    Degenerative cervical spinal stenosis    Depression    Diabetic autonomic neuropathy ([place])    Dyspnea    with exertion    GERD (gastroesophageal reflux disease)    Headache    History of bronchitis    History of kidney stones    Hyperlipidemia    Hypertension    Hypogonadism in male    Myocardial infarction ([place])    Neuropathy    NSTEMI (non-ST elevated myocardial infarction) ([place])    10/29/2018- PCI/DESx1 to pLAD, EF 35%   Orthostatic hypotension    OSA (obstructive sleep apnea)    per pt study done 2006 (approx)  used cpap few yrs then stopped using, stated didn't feel he needed it anymore   Psoriasis    Retinal tear    Right knee meniscal tear    Type 2 diabetes mellitus ([place])    Urinary hesitancy    Vitamin D deficiency     Past Surgical History:  Procedure Laterality Date   CATARACT EXTRACTION W/ INTRAOCULAR LENS  IMPLANT, BILATERAL  right 2015//  left 2012   CORONARY/GRAFT ACUTE MI REVASCULARIZATION N/A 10/30/2018   Procedure: Coronary/Graft Acute MI Revascularization;   Surgeon: Troy Sine, MD;  Location: [facility];  Service: Cardiovascular;  Laterality: N/A;   CORRECTION REPAIR MULTIPLE  TOES , LEFT FOOT  YRS AGO   EXTRACORPOREAL SHOCK WAVE LITHOTRIPSY  2000 approx   KNEE ARTHROSCOPY Left 2007   KNEE ARTHROSCOPY Right 02/10/2015   Procedure: ARTHROSCOPY RIGHT KNEE WITH DEBRIDEMENT AND CHRONDROPLASTY;  Surgeon: Gaynelle Arabian, MD;  Location: [facility];  Service: Orthopedics;  Laterality: Right;   LEFT HEART CATH AND CORONARY ANGIOGRAPHY N/A 10/30/2018   Procedure: LEFT HEART CATH AND CORONARY ANGIOGRAPHY;  Surgeon: Troy Sine, MD;  Location: [facility];  Service: Cardiovascular;  Laterality: N/A;   PARTIAL KNEE ARTHROPLASTY Right 07/19/2016   Procedure: RIGHT KNEE MEDICAL UNICOMPARTMENTAL ARTHROPLASTY;  Surgeon: Gaynelle Arabian, MD;  Location: [facility];  Service: Orthopedics;  Laterality: Right;   RADICAL ORCHIECTOMY Left 1996   benign   REMOVAL SPURS RIGHT GREAT TOE  YRS AGO   TOTAL KNEE ARTHROPLASTY Left 09-16-2007    There were no vitals filed for this visit.   Subjective Assessment - 02/28/21 1507     Subjective Patient reports with low back pain that is constant with movement. Patient is unable to sit, stand, and walk  for long period of time (5-72mins max) due to pain. Imaging from 2018 shows L4-L5 bilateral facet athropathy with 58mm anterolisthesis (see below in hx). Patient is interested in aquatic therapy and wants to increase strenght in lower extremity. Patient also reports upper back, and neck pain but main concern is low back pain and weakness.    Patient is accompained by: Family member    Pertinent History trouble sleeping, heart attack 2019, diabetes type 2, L4-5: Bilateral facet arthropathy with 7 mm anterolisthesis. Disc  degeneration with annular fissures and circumferential protrusion of  the disc. Stenosis of both lateral recesses and neural foramina that  could cause neural compression on either or both  sides.    L5-S1: Disc degeneration with shallow disc protrusion but no  compressive effect upon the thecal sac or S1 nerves.    Limitations Sitting;Walking;Lifting;Standing    How long can you stand comfortably? limited 5-10,    How long can you walk comfortably? limited    Diagnostic tests fusion l4-l5 plan for patient.    Patient Stated Goals return to tennis, golf    Currently in Pain? Yes    Pain Score 7     Pain Location Back    Pain Orientation Right;Left    Pain Descriptors / Indicators Aching    Pain Type Chronic pain    Pain Onset More than a month ago    Pain Frequency Constant    Aggravating Factors  bending, standing, and walking    Pain Relieving Factors laying down on the aide    Effect of Pain on Daily Activities difficulty walking    Pain Onset More than a month ago                [facility] PT Assessment - 02/28/21 0001       Assessment   Medical Diagnosis Low back pain: denerative    Referring Provider (PT) Melina Schools, MD    Hand Dominance Right    Prior Therapy 2018 aquatic therapy,      Precautions   Precautions Fall      Restrictions   Weight Bearing Restrictions No      Balance Screen   Has the patient fallen in the past 6 months Yes    How many times? 4    Has the patient had a decrease in activity level because of a fear of falling?  Yes    Is the patient reluctant to leave their home because of a fear of falling?  Yes      [place] Private residence    Living Arrangements Spouse/significant other    Type of [place] Access Level entry    [place] - quad      Prior Function   Vocation Retired    Leisure golf, tennis, bike      Cognition   Overall Cognitive Status Within Functional Limits for tasks assessed    Attention Focused    Focused Attention Appears intact    Memory Appears intact    Awareness Appears intact    Problem Solving Appears intact      Sensation   Light Touch  Appears Intact    Additional Comments bilateral tingling in the feet      Coordination   Gross Motor Movements are Fluid and Coordinated Yes    Fine Motor Movements are Fluid and Coordinated Yes      Posture/Postural Control   Posture/Postural  Control Postural limitations    Postural Limitations Rounded Shoulders;Forward head;Increased thoracic kyphosis      ROM / Strength   AROM / PROM / Strength AROM;PROM;Strength      AROM   Overall AROM Comments within normal limits in hip    AROM Assessment Site Cervical;Lumbar;Hip    Right/Left Hip Right    Right Hip Flexion 90    Cervical Flexion 40    Cervical Extension 20    Cervical - Right Rotation 40    Cervical - Left Rotation 35    Lumbar Flexion limited by pain    Lumbar - Right Side Bend limited by pain 70% of N    Lumbar - Left Side Bend limited by pain 70% of N    Lumbar - Right Rotation limited 85% of N    Lumbar - Left Rotation limited 85% of N      Strength   Strength Assessment Site Hip;Knee    Right/Left Hip Right;Left    Right Hip Flexion 3+/5    Right Hip ABduction 3+/5    Right Hip ADduction 4/5    Left Hip Flexion 3+/5    Left Hip ABduction 3+/5    Left Hip ADduction 4+/5    Right Knee Flexion 4-/5    Right Knee Extension 4-/5    Left Knee Flexion 4-/5    Left Knee Extension 4-/5      Palpation   Palpation comment palpation had some tightness near L4/L5, erector spinae, glutes. Overall not tender to palpation      Transfers   Transfers Sit to Stand;Stand to Sit    Sit to Stand 5: Supervision    Stand to Sit 5: Supervision    Comments use of arms for support for standing and sitting      Ambulation/Gait   Gait Comments decrease swing phase, small cadence, decreased hip flexion during ambulation, deficits overall in gait and mobility      Balance   Balance Assessed Yes   [place]: eye open (30sec contact guard), eyes closed (15 sec min assist), tandem: 15 sec - mod assist w eyes open                           [facility] Adult PT Treatment/Exercise - 02/28/21 0001       Exercises   Exercises Lumbar      Lumbar Exercises: Seated   Other Seated Lumbar Exercises 2x10 banded seated hip abdcution, 2x10 seated banded hip flexion, 2x10 seated banded knee extension; all exercises done in yellow band                    PT Education - 02/28/21 1416     Education Details Reviewed intial HEP and what physical therapy will entail.    Person(s) Educated Patient;Spouse    Methods Explanation;Demonstration;Tactile cues;Verbal cues    Comprehension Verbalized understanding;Returned demonstration;Verbal cues required;Tactile cues required              PT Short Term Goals - 02/28/21 1505       PT SHORT TERM GOAL #1   Title Patient will be indpendnet with basic pool program    Time 3    Period Weeks    Status New    Target Date 03/21/21      PT SHORT TERM GOAL #2   Title Patient will demonstrate 4+/5 gross bilateral LE strength    Time 3  Period Weeks    Status New    Target Date 03/21/21               PT Long Term Goals - 02/28/21 1506       PT LONG TERM GOAL #1   Title Patient will improve gait mechanics in order to amblate 700 feet with decreased fatigue in order to ambulate in the community.    Baseline limited walking    Time 4    Period Weeks    Status New    Target Date 03/28/21      PT LONG TERM GOAL #2   Title Patient will increase lower extremity strength in bilateral hips and knee in order to improve transfers from sitting to standing, and standing to sitting.    Baseline 3+/5 throughout bilateral hip (except adduction 4/5), 4-/5 throughout bilateral knee    Time 4    Period Weeks    Status New    Target Date 03/28/21      PT LONG TERM GOAL #3   Title Patient will improve lumbar and cervical range of motion by 10% in order to perform ADL's safely.    Baseline limited cervical rotation 35 Lt rotation; Rt rotation 40, lumbar  rotation 85%, side bending 70%.    Time 4    Period Weeks    Status New    Target Date 03/28/21      PT LONG TERM GOAL #4   Title Patient will be independent with final HEP and demonstrate understanding of correct form with exercises and stretches.    Baseline Inital HEP    Time 4    Period Weeks    Status New                   Plan - 02/28/21 1508     Clinical Impression Statement Patient presents with bilateral low back pain with no sciatica radiating secondary to degenerative disk disease of lumbar/sacral spine. Imaging from 2019 shows patient has L4-L5 bilateral facet arthropathy, disc degeneration, and stenosis. Previous imaging from 2018 shows L5-S1 disc degeneration but no compressive effective on S1 nerves. Patient presents with lower extremity weakness, decreased range of motion in lumbar and cervical movements, and deficits in gait and balance. During therapy patient was educated on initial HEP (seated clamshells, seated hip flexion, seated knee extension) and improving strength. Patient tolerated treated with no increase in pain but felt weak. Therapy will focus on improving strength of LE, balance and gait, and improving range of motion in lumbar and cervical region.    Personal Factors and Comorbidities Comorbidity 1;Comorbidity 2    Comorbidities myocardial infarction HCC, diabetes type 2, dysautonomia, orthostatic hypotension (patient states it has not occured in over a year)    Examination-Activity Limitations Bathing;Bend;Carry;Stairs;Stand;Transfers    Examination-Participation Restrictions Cleaning;Community Activity;Yard Work;Other    Stability/Clinical Decision Making Evolving/Moderate complexity    Rehab Potential Good    PT Frequency 2x / week    PT Duration 4 weeks    PT Treatment/Interventions ADLs/Self Care Home Management;Aquatic Therapy;Contrast Bath;Gait training;Stair training;Electrical Stimulation;Cryotherapy;Functional mobility training;Therapeutic  activities;Therapeutic exercise;Balance training;Traction;Moist Heat;Manual techniques;Taping;Energy conservation;Dry needling;Neuromuscular re-education;Passive range of motion;Patient/family education;Joint Manipulations    PT Next Visit Plan Aquatic therapy intro, improve gait mechanics and improve balance by increasing core strenght and LE strength, progress HEP to improve lower extremity strength in hips and knees, manuel therapy to reduce upper trap tightness; lower back tightness..    PT Home Exercise Plan 3x10 banded seated  hip abdcution, 3x10 seated banded hip flexion, 3x10 seated banded knee extension; all exercises done in yellow band    Consulted and Agree with Plan of Care Patient;Family member/caregiver    Family Member Consulted wife             Patient will benefit from skilled therapeutic intervention in order to improve the following deficits and impairments:  Abnormal gait, Decreased activity tolerance, Decreased balance, Decreased coordination, Decreased range of motion, Decreased mobility, Decreased endurance, Decreased strength, Difficulty walking, Hypomobility, Impaired sensation, Pain, Impaired UE functional use, Postural dysfunction  Visit Diagnosis: Chronic bilateral low back pain without sciatica  Other abnormalities of gait and mobility  Muscle weakness (generalized)  Cervicalgia     Problem List Patient Active Problem List   Diagnosis Date Noted   Orthostatic hypotension 01/30/2020   Hypertension 11/01/2018   Hyperlipidemia 50/41/3643   Acute systolic heart failure ([place]) 11/01/2018   NSTEMI (non-ST elevated myocardial infarction) ([place]) 10/29/2018   Complex sleep apnea syndrome 07/10/2017   Treatment-emergent central sleep apnea 07/10/2017   Myocardial infarction ([place]) 07/10/2017   Excessive daytime sleepiness 05/31/2017   Snoring 05/31/2017   Coronary artery disease involving native coronary artery of native heart without angina pectoris 05/31/2017    Intermittent palpitations 05/31/2017   Perspiration-excessive 05/31/2017   Dysautonomia orthostatic hypotension syndrome 05/31/2017   OA (osteoarthritis) of knee 07/19/2016   Acute medial meniscal tear 02/10/2015    Carney Living 02/28/2021, 3:09 PM  [facility] [address] [place], [place], [address] Phone: [phone]   Fax:  [phone]  Name: Devin Combs MRN: [id number] Date of Birth: [birth year]

## 2021-03-01 ENCOUNTER — Encounter (HOSPITAL_BASED_OUTPATIENT_CLINIC_OR_DEPARTMENT_OTHER): Payer: Self-pay | Admitting: Physical Therapy

## 2021-03-01 ENCOUNTER — Other Ambulatory Visit: Payer: Self-pay | Admitting: Cardiovascular Disease

## 2021-03-01 DIAGNOSIS — M47816 Spondylosis without myelopathy or radiculopathy, lumbar region: Secondary | ICD-10-CM | POA: Insufficient documentation

## 2021-03-01 NOTE — Therapy (Signed)
[facility] [address] [place], [place], [address] Phone: [phone]   Fax:  [phone]  Physical Therapy Treatment  Patient Details  Name: Devin Combs MRN: [id number] Date of Birth: 1951/02/28 Referring Provider (PT): Melina Schools, MD   Encounter Date: 02/28/2021   PT End of Session - 03/01/21 0910     Visit Number 2    Number of Visits 6    Date for PT Re-Evaluation 04/11/21    PT Start Time 4010    PT Stop Time 2725    PT Time Calculation (min) 43 min    Equipment Utilized During Treatment Other (comment)   water board, noodles, water dumbbells   Activity Tolerance Patient tolerated treatment well    Behavior During Therapy WFL for tasks assessed/performed             Past Medical History:  Diagnosis Date   Acute systolic heart failure ([place])    Anxiety    Arthritis    Cancer ([place])    basal and squamous cell carcinoma   Chronic back pain    DDD (degenerative disc disease), thoracolumbar    Degenerative cervical spinal stenosis    Depression    Diabetic autonomic neuropathy ([place])    Dyspnea    with exertion    GERD (gastroesophageal reflux disease)    Headache    History of bronchitis    History of kidney stones    Hyperlipidemia    Hypertension    Hypogonadism in male    Myocardial infarction ([place])    Neuropathy    NSTEMI (non-ST elevated myocardial infarction) ([place])    10/29/2018- PCI/DESx1 to pLAD, EF 35%   Orthostatic hypotension    OSA (obstructive sleep apnea)    per pt study done 2006 (approx)  used cpap few yrs then stopped using, stated didn't feel he needed it anymore   Psoriasis    Retinal tear    Right knee meniscal tear    Type 2 diabetes mellitus ([place])    Urinary hesitancy    Vitamin D deficiency     Past Surgical History:  Procedure Laterality Date   CATARACT EXTRACTION W/ INTRAOCULAR LENS  IMPLANT, BILATERAL  right 2015//  left 2012   CORONARY/GRAFT ACUTE MI  REVASCULARIZATION N/A 10/30/2018   Procedure: Coronary/Graft Acute MI Revascularization;  Surgeon: Troy Sine, MD;  Location: [facility];  Service: Cardiovascular;  Laterality: N/A;   CORRECTION REPAIR MULTIPLE  TOES , LEFT FOOT  YRS AGO   EXTRACORPOREAL SHOCK WAVE LITHOTRIPSY  2000 approx   KNEE ARTHROSCOPY Left 2007   KNEE ARTHROSCOPY Right 02/10/2015   Procedure: ARTHROSCOPY RIGHT KNEE WITH DEBRIDEMENT AND CHRONDROPLASTY;  Surgeon: Gaynelle Arabian, MD;  Location: [facility];  Service: Orthopedics;  Laterality: Right;   LEFT HEART CATH AND CORONARY ANGIOGRAPHY N/A 10/30/2018   Procedure: LEFT HEART CATH AND CORONARY ANGIOGRAPHY;  Surgeon: Troy Sine, MD;  Location: [facility];  Service: Cardiovascular;  Laterality: N/A;   PARTIAL KNEE ARTHROPLASTY Right 07/19/2016   Procedure: RIGHT KNEE MEDICAL UNICOMPARTMENTAL ARTHROPLASTY;  Surgeon: Gaynelle Arabian, MD;  Location: [facility];  Service: Orthopedics;  Laterality: Right;   RADICAL ORCHIECTOMY Left 1996   benign   REMOVAL SPURS RIGHT GREAT TOE  YRS AGO   TOTAL KNEE ARTHROPLASTY Left 09-16-2007    There were no vitals filed for this visit.   Subjective Assessment - 03/01/21 0906     Subjective Patient reports that he is  feeling less sore than he was on the initial day of the visit but overall he feels the same pain level. He states that his exercises made him sore but none of them aggrevated his pain.    Patient is accompained by: Family member    Pertinent History trouble sleeping, heart attack 2019, diabetes type 2, L4-5: Bilateral facet arthropathy with 7 mm anterolisthesis. Disc  degeneration with annular fissures and circumferential protrusion of  the disc. Stenosis of both lateral recesses and neural foramina that  could cause neural compression on either or both sides.    L5-S1: Disc degeneration with shallow disc protrusion but no  compressive effect upon the thecal sac or S1 nerves.    Limitations  Sitting;Walking;Lifting;Standing    How long can you stand comfortably? limited 5-10,    How long can you walk comfortably? limited    Diagnostic tests fusion l4-l5 plan for patient.    Patient Stated Goals return to tennis, golf    Currently in Pain? Yes    Pain Score 7     Pain Location Back    Pain Orientation Right;Left    Pain Descriptors / Indicators Aching    Pain Type Chronic pain    Pain Onset More than a month ago    Pain Frequency Constant    Aggravating Factors  bending, standing, walking    Pain Relieving Factors laying down on the side, leaning for support    Effect of Pain on Daily Activities difficulty walking    Multiple Pain Sites Yes    Pain Score 3    Pain Location Neck    Pain Orientation Right;Left    Pain Descriptors / Indicators Aching    Pain Type Chronic pain    Pain Onset More than a month ago    Pain Frequency Intermittent    Aggravating Factors  turning head    Pain Relieving Factors rest               Pt seen for aquatic therapy today.  Treatment took place in water 3.25-4 ft in depth at the [facility] pool. Temp of water was 91.  Pt entered/exited the pool via stairs (step through pattern) independently with bilat rail.  Introduction to water. Had patient stand at different levels so she could feel the bouncy  Warm up: heel/toe walking x4 laps across pool chest deep side stepping x4 laps from shallow to deep;   Exercises; Slow march x20; hip extension x20; hip abduction x20; hip flexion x20, double leg calf raises x20; Seated lateral board rotation x20, seated board flexion x20, standing dumbbell press downs x30.   With neck noddle and foot float; push pull x20; side to side perturbations from the feet x20; press and push x20.   Pt requires buoyancy for support and to offload joints with strengthening exercises. Viscosity of the water is needed for resistance of strengthening; water current perturbations provides challenge to  standing balance unsupported, requiring increased core activation.       PT Education - 03/01/21 0909     Education Details Reviewed aquatic therapy exercises and what to expect.    Person(s) Educated Patient;Spouse    Methods Explanation;Demonstration;Tactile cues;Verbal cues    Comprehension Verbalized understanding;Returned demonstration;Tactile cues required;Verbal cues required              PT Short Term Goals - 02/28/21 1505       PT SHORT TERM GOAL #1   Title Patient will be indpendnet  with basic pool program    Time 3    Period Weeks    Status New    Target Date 03/21/21      PT SHORT TERM GOAL #2   Title Patient will demonstrate 4+/5 gross bilateral LE strength    Time 3    Period Weeks    Status New    Target Date 03/21/21               PT Long Term Goals - 02/28/21 1506       PT LONG TERM GOAL #1   Title Patient will improve gait mechanics in order to amblate 700 feet with decreased fatigue in order to ambulate in the community.    Baseline limited walking    Time 4    Period Weeks    Status New    Target Date 03/28/21      PT LONG TERM GOAL #2   Title Patient will increase lower extremity strength in bilateral hips and knee in order to improve transfers from sitting to standing, and standing to sitting.    Baseline 3+/5 throughout bilateral hip (except adduction 4/5), 4-/5 throughout bilateral knee    Time 4    Period Weeks    Status New    Target Date 03/28/21      PT LONG TERM GOAL #3   Title Patient will improve lumbar and cervical range of motion by 10% in order to perform ADL's safely.    Baseline limited cervical rotation 35 Lt rotation; Rt rotation 40, lumbar rotation 85%, side bending 70%.    Time 4    Period Weeks    Status New    Target Date 03/28/21      PT LONG TERM GOAL #4   Title Patient will be independent with final HEP and demonstrate understanding of correct form with exercises and stretches.    Baseline Inital HEP     Time 4    Period Weeks    Status New                   Plan - 03/01/21 0912     Clinical Impression Statement Patient tolerated aqautic therapy with no increase in pain. Patient stated that being in the deep water helped him not think about the pain. Patient was able to complete session with positive remarks. Therapy should continue to focus on building strength in lower extremity, improving gait and mobility, and improving balance,    Personal Factors and Comorbidities Comorbidity 1;Comorbidity 2    Comorbidities myocardial infarction HCC, diabetes type 2, dysautonomia, orthostatic hypotension (patient states it has not occured in over a year)    Examination-Activity Limitations Bathing;Bend;Carry;Stairs;Stand;Transfers    Examination-Participation Restrictions Cleaning;Community Activity;Yard Work;Other    Stability/Clinical Decision Making Evolving/Moderate complexity    Clinical Decision Making Moderate    Rehab Potential Good    PT Frequency 2x / week    PT Duration 4 weeks    PT Treatment/Interventions ADLs/Self Care Home Management;Aquatic Therapy;Contrast Bath;Gait training;Stair training;Electrical Stimulation;Cryotherapy;Functional mobility training;Therapeutic activities;Therapeutic exercise;Balance training;Traction;Moist Heat;Manual techniques;Taping;Energy conservation;Dry needling;Neuromuscular re-education;Passive range of motion;Patient/family education;Joint Manipulations    PT Next Visit Plan Aquatic therapy intro, improve gait mechanics and improve balance by increasing core strenght and LE strength, progress HEP to improve lower extremity strength in hips and knees, manuel therapy to reduce upper trap tightness; lower back tightness.    PT Home Exercise Plan 3x10 banded seated hip abdcution, 3x10 seated banded hip flexion, 3x10 seated  banded knee extension; all exercises done in yellow band    Consulted and Agree with Plan of Care Patient;Family member/caregiver     Family Member Consulted wife             Patient will benefit from skilled therapeutic intervention in order to improve the following deficits and impairments:  Abnormal gait, Decreased activity tolerance, Decreased balance, Decreased coordination, Decreased range of motion, Decreased mobility, Decreased endurance, Decreased strength, Difficulty walking, Hypomobility, Impaired sensation, Pain, Impaired UE functional use, Postural dysfunction  Visit Diagnosis: Chronic bilateral low back pain without sciatica  Other abnormalities of gait and mobility  Muscle weakness (generalized)  Cervicalgia     Problem List Patient Active Problem List   Diagnosis Date Noted   Orthostatic hypotension 01/30/2020   Hypertension 11/01/2018   Hyperlipidemia 76/81/1572   Acute systolic heart failure ([place]) 11/01/2018   NSTEMI (non-ST elevated myocardial infarction) ([place]) 10/29/2018   Complex sleep apnea syndrome 07/10/2017   Treatment-emergent central sleep apnea 07/10/2017   Myocardial infarction ([place]) 07/10/2017   Excessive daytime sleepiness 05/31/2017   Snoring 05/31/2017   Coronary artery disease involving native coronary artery of native heart without angina pectoris 05/31/2017   Intermittent palpitations 05/31/2017   Perspiration-excessive 05/31/2017   Dysautonomia orthostatic hypotension syndrome 05/31/2017   OA (osteoarthritis) of knee 07/19/2016   Acute medial meniscal tear 02/10/2015  Clovis Pu PT DPT  03/01/2021    Billey Co SPT 03/01/2021, 9:23 AM  During this treatment session, the therapist was present, participating in and directing the treatment.   [facility] [address] [place], [place], [address] Phone: [phone]   Fax:  [phone]  Name: Devin Combs MRN: [id number] Date of Birth: 08/19/1950

## 2021-03-02 ENCOUNTER — Other Ambulatory Visit: Payer: Self-pay

## 2021-03-02 ENCOUNTER — Ambulatory Visit (HOSPITAL_BASED_OUTPATIENT_CLINIC_OR_DEPARTMENT_OTHER): Payer: Medicare Other | Admitting: Physical Therapy

## 2021-03-02 ENCOUNTER — Encounter (HOSPITAL_BASED_OUTPATIENT_CLINIC_OR_DEPARTMENT_OTHER): Payer: Self-pay | Admitting: Physical Therapy

## 2021-03-02 DIAGNOSIS — M6281 Muscle weakness (generalized): Secondary | ICD-10-CM

## 2021-03-02 DIAGNOSIS — G8929 Other chronic pain: Secondary | ICD-10-CM

## 2021-03-02 DIAGNOSIS — M545 Low back pain, unspecified: Secondary | ICD-10-CM

## 2021-03-02 DIAGNOSIS — R2689 Other abnormalities of gait and mobility: Secondary | ICD-10-CM

## 2021-03-02 DIAGNOSIS — M542 Cervicalgia: Secondary | ICD-10-CM

## 2021-03-02 NOTE — Therapy (Signed)
[facility] [address] [place], [place], [address] Phone: [phone]   Fax:  [phone]  Physical Therapy Treatment  Patient Details  Name: Devin Combs MRN: [id number] Date of Birth: 1951/01/06 Referring Provider (PT): Melina Schools, MD   Encounter Date: 03/02/2021   PT End of Session - 03/02/21 1628     Visit Number 3    Number of Visits 8    Date for PT Re-Evaluation 04/11/21    PT Start Time 6734    PT Stop Time 1520    PT Time Calculation (min) 42 min    Activity Tolerance Patient tolerated treatment well    Behavior During Therapy [facility] for tasks assessed/performed             Past Medical History:  Diagnosis Date   Acute systolic heart failure ([place])    Anxiety    Arthritis    Cancer ([place])    basal and squamous cell carcinoma   Chronic back pain    DDD (degenerative disc disease), thoracolumbar    Degenerative cervical spinal stenosis    Depression    Diabetic autonomic neuropathy ([facility])    Dyspnea    with exertion    GERD (gastroesophageal reflux disease)    Headache    History of bronchitis    History of kidney stones    Hyperlipidemia    Hypertension    Hypogonadism in male    Myocardial infarction [facility])    Neuropathy    NSTEMI (non-ST elevated myocardial infarction) ([place])    10/29/2018- PCI/DESx1 to pLAD, EF 35%   Orthostatic hypotension    OSA (obstructive sleep apnea)    per pt study done 2006 (approx)  used cpap few yrs then stopped using, stated didn't feel he needed it anymore   Psoriasis    Retinal tear    Right knee meniscal tear    Type 2 diabetes mellitus ([place])    Urinary hesitancy    Vitamin D deficiency     Past Surgical History:  Procedure Laterality Date   CATARACT EXTRACTION W/ INTRAOCULAR LENS  IMPLANT, BILATERAL  right 2015//  left 2012   CORONARY/GRAFT ACUTE MI REVASCULARIZATION N/A 10/30/2018   Procedure: Coronary/Graft Acute MI Revascularization;  Surgeon:  Troy Sine, MD;  Location: [facility];  Service: Cardiovascular;  Laterality: N/A;   CORRECTION REPAIR MULTIPLE  TOES , LEFT FOOT  YRS AGO   EXTRACORPOREAL SHOCK WAVE LITHOTRIPSY  2000 approx   KNEE ARTHROSCOPY Left 2007   KNEE ARTHROSCOPY Right 02/10/2015   Procedure: ARTHROSCOPY RIGHT KNEE WITH DEBRIDEMENT AND CHRONDROPLASTY;  Surgeon: Gaynelle Arabian, MD;  Location: [facility];  Service: Orthopedics;  Laterality: Right;   LEFT HEART CATH AND CORONARY ANGIOGRAPHY N/A 10/30/2018   Procedure: LEFT HEART CATH AND CORONARY ANGIOGRAPHY;  Surgeon: Troy Sine, MD;  Location: [facility];  Service: Cardiovascular;  Laterality: N/A;   PARTIAL KNEE ARTHROPLASTY Right 07/19/2016   Procedure: RIGHT KNEE MEDICAL UNICOMPARTMENTAL ARTHROPLASTY;  Surgeon: Gaynelle Arabian, MD;  Location: [facility];  Service: Orthopedics;  Laterality: Right;   RADICAL ORCHIECTOMY Left 1996   benign   REMOVAL SPURS RIGHT GREAT TOE  YRS AGO   TOTAL KNEE ARTHROPLASTY Left 09-16-2007    There were no vitals filed for this visit.   Subjective Assessment - 03/02/21 1621     Subjective Patient reported feeling sore after first day of aquatic therapy. He stated that he also walked at supermarket that may  have also contributed to his soreness. He states that he felt better today and soreness has decreased. Patient states that he is eager to continue aquatic therapy in order to increase strenght.    Patient is accompained by: Family member    Pertinent History trouble sleeping, heart attack 2019, diabetes type 2, L4-5: Bilateral facet arthropathy with 7 mm anterolisthesis. Disc  degeneration with annular fissures and circumferential protrusion of  the disc. Stenosis of both lateral recesses and neural foramina that  could cause neural compression on either or both sides.    L5-S1: Disc degeneration with shallow disc protrusion but no  compressive effect upon the thecal sac or S1 nerves.    Limitations  Sitting;Walking;Lifting;Standing    How long can you stand comfortably? limited 5-10,    How long can you walk comfortably? limited    Diagnostic tests fusion l4-l5 plan for patient.    Patient Stated Goals return to tennis, golf    Currently in Pain? Yes    Pain Score 6     Pain Location Back    Pain Orientation Left;Right    Pain Descriptors / Indicators Aching    Pain Type Chronic pain    Pain Onset More than a month ago    Pain Frequency Constant    Aggravating Factors  bending, standing, walking    Pain Relieving Factors laying down on side, leaning for support    Effect of Pain on Daily Activities difficulty walking    Multiple Pain Sites Yes    Pain Score 3    Pain Location Neck    Pain Orientation Right;Left    Pain Descriptors / Indicators Aching    Pain Type Chronic pain    Pain Onset More than a month ago    Pain Frequency Intermittent    Aggravating Factors  turn head    Pain Relieving Factors rest             Pt seen for aquatic therapy today.  Treatment took place in water 4-4.5 ft in depth at the [facility]. Temp of water was 91.  Pt entered/exited the pool via stairs (step through pattern) independently with bilat rail.   Warm up: heel/toe walking x4 laps across pool & side stepping x4 laps.   Exercises; Slow march x20; hip extension x20; hip abduction x20. board trunk flexion x20 lateral board rotation x20 each way seated. Seated knee extension x20. Slow walking with blue noodle across pool x3. Step up x10 on each leg while holding on wall for support.   LAQ 3x10 3lbs   Pt requires buoyancy for support and to offload joints with strengthening exercises. Viscosity of the water is needed for resistance of strengthening; water current perturbations provides challenge to standing balance unsupported, requiring increased core activation.     PT Education - 03/02/21 1625     Education Details Reviewed aquatic therapy exercises and emphasis on  hydration following therapy.    Person(s) Educated Patient;Spouse    Methods Explanation;Demonstration;Tactile cues    Comprehension Returned demonstration;Verbalized understanding;Verbal cues required              PT Short Term Goals - 02/28/21 1505       PT SHORT TERM GOAL #1   Title Patient will be indpendnet with basic pool program    Time 3    Period Weeks    Status New    Target Date 03/21/21      PT SHORT TERM GOAL #2  Title Patient will demonstrate 4+/5 gross bilateral LE strength    Time 3    Period Weeks    Status New    Target Date 03/21/21               PT Long Term Goals - 02/28/21 1506       PT LONG TERM GOAL #1   Title Patient will improve gait mechanics in order to amblate 700 feet with decreased fatigue in order to ambulate in the community.    Baseline limited walking    Time 4    Period Weeks    Status New    Target Date 03/28/21      PT LONG TERM GOAL #2   Title Patient will increase lower extremity strength in bilateral hips and knee in order to improve transfers from sitting to standing, and standing to sitting.    Baseline 3+/5 throughout bilateral hip (except adduction 4/5), 4-/5 throughout bilateral knee    Time 4    Period Weeks    Status New    Target Date 03/28/21      PT LONG TERM GOAL #3   Title Patient will improve lumbar and cervical range of motion by 10% in order to perform ADL's safely.    Baseline limited cervical rotation 35 Lt rotation; Rt rotation 40, lumbar rotation 85%, side bending 70%.    Time 4    Period Weeks    Status New    Target Date 03/28/21      PT LONG TERM GOAL #4   Title Patient will be independent with final HEP and demonstrate understanding of correct form with exercises and stretches.    Baseline Inital HEP    Time 4    Period Weeks    Status New                   Plan - 03/02/21 1639     Clinical Impression Statement Patient tolerated aquatic therapy with no increase in pain.  Patient stated that he felt better at the end of the aquatic session. Patient should continue with therapy as tolerated while moniterring symptoms following aquatic therapy. Therapy should continue to focus on strength, improving gait and mobility, and improving balance.    Personal Factors and Comorbidities Comorbidity 1;Comorbidity 2    Comorbidities myocardial infarction HCC, diabetes type 2, dysautonomia, orthostatic hypotension (patient states it has not occured in over a year)    Examination-Activity Limitations Bathing;Bend;Carry;Stairs;Stand;Transfers    Examination-Participation Restrictions Cleaning;Community Activity;Yard Work;Other    Stability/Clinical Decision Making Evolving/Moderate complexity    Clinical Decision Making Moderate    Rehab Potential Good    PT Frequency 2x / week    PT Duration 4 weeks    PT Treatment/Interventions ADLs/Self Care Home Management;Aquatic Therapy;Contrast Bath;Gait training;Stair training;Electrical Stimulation;Cryotherapy;Functional mobility training;Therapeutic activities;Therapeutic exercise;Balance training;Traction;Moist Heat;Manual techniques;Taping;Energy conservation;Dry needling;Neuromuscular re-education;Passive range of motion;Patient/family education;Joint Manipulations    PT Next Visit Plan Aquatic therapy intro, improve gait mechanics and improve balance by increasing core strenght and LE strength, progress HEP to improve lower extremity strength in hips and knees, manuel therapy to reduce upper trap tightness; lower back tightness.    PT Home Exercise Plan 3x10 banded seated hip abdcution, 3x10 seated banded hip flexion, 3x10 seated banded knee extension; all exercises done in yellow band    Consulted and Agree with Plan of Care Patient;Family member/caregiver    Family Member Consulted wife  Patient will benefit from skilled therapeutic intervention in order to improve the following deficits and impairments:  Abnormal  gait, Decreased activity tolerance, Decreased balance, Decreased coordination, Decreased range of motion, Decreased mobility, Decreased endurance, Decreased strength, Difficulty walking, Hypomobility, Impaired sensation, Pain, Impaired UE functional use, Postural dysfunction  Visit Diagnosis: Chronic bilateral low back pain without sciatica  Other abnormalities of gait and mobility  Muscle weakness (generalized)  Cervicalgia     Problem List Patient Active Problem List   Diagnosis Date Noted   Orthostatic hypotension 01/30/2020   Hypertension 11/01/2018   Hyperlipidemia 40/37/0964   Acute systolic heart failure ([place]) 11/01/2018   NSTEMI (non-ST elevated myocardial infarction) ([place]) 10/29/2018   Complex sleep apnea syndrome 07/10/2017   Treatment-emergent central sleep apnea 07/10/2017   Myocardial infarction ([place]) 07/10/2017   Excessive daytime sleepiness 05/31/2017   Snoring 05/31/2017   Coronary artery disease involving native coronary artery of native heart without angina pectoris 05/31/2017   Intermittent palpitations 05/31/2017   Perspiration-excessive 05/31/2017   Dysautonomia orthostatic hypotension syndrome 05/31/2017   OA (osteoarthritis) of knee 07/19/2016   Acute medial meniscal tear 02/10/2015    Billey Co SPT 03/02/2021, 4:45 PM  [facility] [address] [place], [place], [address] Phone: [phone]   Fax:  [phone]  Name: Devin Combs MRN: [id number] Date of Birth: 09-24-1950

## 2021-03-09 ENCOUNTER — Other Ambulatory Visit: Payer: Self-pay

## 2021-03-09 ENCOUNTER — Ambulatory Visit (HOSPITAL_BASED_OUTPATIENT_CLINIC_OR_DEPARTMENT_OTHER): Payer: Medicare Other | Admitting: Physical Therapy

## 2021-03-09 DIAGNOSIS — M545 Low back pain, unspecified: Secondary | ICD-10-CM

## 2021-03-09 DIAGNOSIS — R2689 Other abnormalities of gait and mobility: Secondary | ICD-10-CM

## 2021-03-09 DIAGNOSIS — G8929 Other chronic pain: Secondary | ICD-10-CM

## 2021-03-09 DIAGNOSIS — M6281 Muscle weakness (generalized): Secondary | ICD-10-CM

## 2021-03-09 DIAGNOSIS — M542 Cervicalgia: Secondary | ICD-10-CM

## 2021-03-10 ENCOUNTER — Encounter (HOSPITAL_BASED_OUTPATIENT_CLINIC_OR_DEPARTMENT_OTHER): Payer: Self-pay | Admitting: Physical Therapy

## 2021-03-10 NOTE — Therapy (Signed)
[facility] [address] [place], [place], [address] Phone: [phone]   Fax:  [phone]  Physical Therapy Treatment  Patient Details  Name: Devin Combs MRN: [id number] Date of Birth: 1950-10-24 Referring Provider (PT): Melina Schools, MD   Encounter Date: 03/09/2021   PT End of Session - 03/10/21 0757     Visit Number 4    Number of Visits 8    Date for PT Re-Evaluation 04/11/21    Authorization Type Medicare A&B and Aetna Supplemental    PT Start Time 1435    PT Stop Time [id number]    PT Time Calculation (min) 40 min    Equipment Utilized During Treatment Other (comment)   aquatic equipment   Activity Tolerance Patient tolerated treatment well    Behavior During Therapy [facility] for tasks assessed/performed             Past Medical History:  Diagnosis Date   Acute systolic heart failure ([place])    Anxiety    Arthritis    Cancer ([place])    basal and squamous cell carcinoma   Chronic back pain    DDD (degenerative disc disease), thoracolumbar    Degenerative cervical spinal stenosis    Depression    Diabetic autonomic neuropathy ([place])    Dyspnea    with exertion    GERD (gastroesophageal reflux disease)    Headache    History of bronchitis    History of kidney stones    Hyperlipidemia    Hypertension    Hypogonadism in male    Myocardial infarction ([place])    Neuropathy    NSTEMI (non-ST elevated myocardial infarction) ([place])    10/29/2018- PCI/DESx1 to pLAD, EF 35%   Orthostatic hypotension    OSA (obstructive sleep apnea)    per pt study done 2006 (approx)  used cpap few yrs then stopped using, stated didn't feel he needed it anymore   Psoriasis    Retinal tear    Right knee meniscal tear    Type 2 diabetes mellitus ([place])    Urinary hesitancy    Vitamin D deficiency     Past Surgical History:  Procedure Laterality Date   CATARACT EXTRACTION W/ INTRAOCULAR LENS  IMPLANT, BILATERAL  right 2015//  left  2012   CORONARY/GRAFT ACUTE MI REVASCULARIZATION N/A 10/30/2018   Procedure: Coronary/Graft Acute MI Revascularization;  Surgeon: Troy Sine, MD;  Location: [facility];  Service: Cardiovascular;  Laterality: N/A;   CORRECTION REPAIR MULTIPLE  TOES , LEFT FOOT  YRS AGO   EXTRACORPOREAL SHOCK WAVE LITHOTRIPSY  2000 approx   KNEE ARTHROSCOPY Left 2007   KNEE ARTHROSCOPY Right 02/10/2015   Procedure: ARTHROSCOPY RIGHT KNEE WITH DEBRIDEMENT AND CHRONDROPLASTY;  Surgeon: Gaynelle Arabian, MD;  Location: [facility];  Service: Orthopedics;  Laterality: Right;   LEFT HEART CATH AND CORONARY ANGIOGRAPHY N/A 10/30/2018   Procedure: LEFT HEART CATH AND CORONARY ANGIOGRAPHY;  Surgeon: Troy Sine, MD;  Location: [facility];  Service: Cardiovascular;  Laterality: N/A;   PARTIAL KNEE ARTHROPLASTY Right 07/19/2016   Procedure: RIGHT KNEE MEDICAL UNICOMPARTMENTAL ARTHROPLASTY;  Surgeon: Gaynelle Arabian, MD;  [facility];  Service: Orthopedics;  Laterality: Right;   RADICAL ORCHIECTOMY Left 1996   benign   REMOVAL SPURS RIGHT GREAT TOE  YRS AGO   TOTAL KNEE ARTHROPLASTY Left 09-16-2007    There were no vitals filed for this visit.   Subjective Assessment - 03/09/21 1522  Subjective Patient reports he is feeling better than he did last visit. Patient stated he did not complete HEP this weekend because he walked more than usual over the weekend. He stated he had a epidural shot yesterday (24+ hours have past since epidural shot).    Patient is accompained by: Family member    Pertinent History trouble sleeping, heart attack 2019, diabetes type 2, L4-5: Bilateral facet arthropathy with 7 mm anterolisthesis. Disc  degeneration with annular fissures and circumferential protrusion of  the disc. Stenosis of both lateral recesses and neural foramina that  could cause neural compression on either or both sides.    L5-S1: Disc degeneration with shallow disc protrusion but no   compressive effect upon the thecal sac or S1 nerves.    Limitations Sitting;Walking;Lifting;Standing    How long can you stand comfortably? limited 5-10,    How long can you walk comfortably? limited    Diagnostic tests fusion l4-l5 plan for patient.    Patient Stated Goals return to tennis, golf    Currently in Pain? Yes    Pain Score 5     Pain Location Back    Pain Orientation Right;Left    Pain Descriptors / Indicators Aching    Pain Type Chronic pain    Pain Onset More than a month ago    Pain Frequency Constant    Aggravating Factors  bending, standing, walking    Pain Relieving Factors laying down on side, leaning for support.    Effect of Pain on Daily Activities difficulty walking    Multiple Pain Sites Yes    Pain Score 3    Pain Location Neck    Pain Orientation Right;Left    Pain Descriptors / Indicators Aching    Pain Type Chronic pain    Pain Onset More than a month ago    Pain Frequency Intermittent    Aggravating Factors  turning head    Pain Relieving Factors rest                  Pt seen for aquatic therapy today.  Treatment took place in water 4-4.5 ft in depth at the [facility]. Temp of water was 91.  Pt entered/exited the pool via stairs (step through pattern) independently with bilat rail.     Warm up: heel/toe walking x4 laps across pool & side stepping x4 laps.    Exercises; Slow march x20; hip extension x20; hip abduction x20. board trunk flexion x20 lateral board rotation x20 each way seated. Seated knee extension x20. Slow walking with blue noodle across pool x3. Step up x10 on each leg while holding on wall for support.    LAQ 3x10 3lbs    Pt requires buoyancy for support and to offload joints with strengthening exercises. Viscosity of the water is needed for resistance of strengthening; water current perturbations provides challenge to standing balance unsupported, requiring increased core  activation.                     PT Education - 03/09/21 1532     Education Details Reviewed aqautic therapy exercises and similar stretches to complete at home.    Person(s) Educated Patient    Methods Explanation;Demonstration;Tactile cues    Comprehension Verbalized understanding;Returned demonstration;Verbal cues required              PT Short Term Goals - 02/28/21 1505       PT SHORT TERM GOAL #1  Title Patient will be indpendnet with basic pool program    Time 3    Period Weeks    Status New    Target Date 03/21/21      PT SHORT TERM GOAL #2   Title Patient will demonstrate 4+/5 gross bilateral LE strength    Time 3    Period Weeks    Status New    Target Date 03/21/21               PT Long Term Goals - 02/28/21 1506       PT LONG TERM GOAL #1   Title Patient will improve gait mechanics in order to amblate 700 feet with decreased fatigue in order to ambulate in the community.    Baseline limited walking    Time 4    Period Weeks    Status New    Target Date 03/28/21      PT LONG TERM GOAL #2   Title Patient will increase lower extremity strength in bilateral hips and knee in order to improve transfers from sitting to standing, and standing to sitting.    Baseline 3+/5 throughout bilateral hip (except adduction 4/5), 4-/5 throughout bilateral knee    Time 4    Period Weeks    Status New    Target Date 03/28/21      PT LONG TERM GOAL #3   Title Patient will improve lumbar and cervical range of motion by 10% in order to perform ADL's safely.    Baseline limited cervical rotation 35 Lt rotation; Rt rotation 40, lumbar rotation 85%, side bending 70%.    Time 4    Period Weeks    Status New    Target Date 03/28/21      PT LONG TERM GOAL #4   Title Patient will be independent with final HEP and demonstrate understanding of correct form with exercises and stretches.    Baseline Inital HEP    Time 4    Period Weeks    Status New                    Plan - 03/10/21 0759     Clinical Impression Statement Patient tolerated aquatic therapy with no increase in pain or discomfort. Patient states that he felt sore at the end of the session but it was not a discomfort level. Patient should continue with therapy as tolerated to further increase strength, balance, and mobility.    Personal Factors and Comorbidities Comorbidity 1;Comorbidity 2    Comorbidities myocardial infarction HCC, diabetes type 2, dysautonomia, orthostatic hypotension (patient states it has not occured in over a year)    Examination-Activity Limitations Bathing;Bend;Carry;Stairs;Stand;Transfers    Examination-Participation Restrictions Cleaning;Community Activity;Yard Work;Other    Stability/Clinical Decision Making Evolving/Moderate complexity    Clinical Decision Making Moderate    Rehab Potential Good    PT Frequency 2x / week    PT Duration 4 weeks    PT Treatment/Interventions ADLs/Self Care Home Management;Aquatic Therapy;Contrast Bath;Gait training;Stair training;Electrical Stimulation;Cryotherapy;Functional mobility training;Therapeutic activities;Therapeutic exercise;Balance training;Traction;Moist Heat;Manual techniques;Taping;Energy conservation;Dry needling;Neuromuscular re-education;Passive range of motion;Patient/family education;Joint Manipulations    PT Next Visit Plan Aquatic therapy intro, improve gait mechanics and improve balance by increasing core strenght and LE strength, progress HEP to improve lower extremity strength in hips and knees, manuel therapy to reduce upper trap tightness; lower back tightness.    PT Home Exercise Plan 3x10 banded seated hip abdcution, 3x10 seated banded hip flexion, 3x10 seated banded knee  extension; all exercises done in yellow band    Consulted and Agree with Plan of Care Patient;Family member/caregiver    Family Member Consulted wife             Patient will benefit from skilled therapeutic  intervention in order to improve the following deficits and impairments:  Abnormal gait, Decreased activity tolerance, Decreased balance, Decreased coordination, Decreased range of motion, Decreased mobility, Decreased endurance, Decreased strength, Difficulty walking, Hypomobility, Impaired sensation, Pain, Impaired UE functional use, Postural dysfunction  Visit Diagnosis: Chronic bilateral low back pain without sciatica  Other abnormalities of gait and mobility  Muscle weakness (generalized)  Cervicalgia     Problem List Patient Active Problem List   Diagnosis Date Noted   Orthostatic hypotension 01/30/2020   Hypertension 11/01/2018   Hyperlipidemia 99991111   Acute systolic heart failure ([place]) 11/01/2018   NSTEMI (non-ST elevated myocardial infarction) ([place]) 10/29/2018   Complex sleep apnea syndrome 07/10/2017   Treatment-emergent central sleep apnea 07/10/2017   Myocardial infarction ([place]) 07/10/2017   Excessive daytime sleepiness 05/31/2017   Snoring 05/31/2017   Coronary artery disease involving native coronary artery of native heart without angina pectoris 05/31/2017   Intermittent palpitations 05/31/2017   Perspiration-excessive 05/31/2017   Dysautonomia orthostatic hypotension syndrome 05/31/2017   OA (osteoarthritis) of knee 07/19/2016   Acute medial meniscal tear 02/10/2015    Carney Living PT DPT  03/10/2021, 8:11 AM  [facility] [address] [place], [place], [address] Phone: [phone]   Fax:  [phone]  Name: Devin Combs MRN: [id number] Date of Birth: 1951/03/11

## 2021-03-10 NOTE — Therapy (Cosign Needed)
[facility] [address] [place], [place], [address] Phone: [phone]   Fax:  [phone]  Physical Therapy Treatment  Patient Details  Name: Devin Combs MRN: [id number] Date of Birth: 08-30-1950 Referring Provider (PT): Melina Schools, MD   Encounter Date: 03/09/2021   PT End of Session - 03/10/21 0757     Visit Number 4    Number of Visits 8    Date for PT Re-Evaluation 04/11/21    Authorization Type Medicare A&B and Aetna Supplemental    PT Start Time 1435    PT Stop Time [id number]    PT Time Calculation (min) 40 min    Equipment Utilized During Treatment Other (comment)   aquatic equipment   Activity Tolerance Patient tolerated treatment well    Behavior During Therapy [facility] for tasks assessed/performed             Past Medical History:  Diagnosis Date   Acute systolic heart failure ([place])    Anxiety    Arthritis    Cancer ([place])    basal and squamous cell carcinoma   Chronic back pain    DDD (degenerative disc disease), thoracolumbar    Degenerative cervical spinal stenosis    Depression    Diabetic autonomic neuropathy ([place])    Dyspnea    with exertion    GERD (gastroesophageal reflux disease)    Headache    History of bronchitis    History of kidney stones    Hyperlipidemia    Hypertension    Hypogonadism in male    Myocardial infarction ([place])    Neuropathy    NSTEMI (non-ST elevated myocardial infarction) ([place])    10/29/2018- PCI/DESx1 to pLAD, EF 35%   Orthostatic hypotension    OSA (obstructive sleep apnea)    per pt study done 2006 (approx)  used cpap few yrs then stopped using, stated didn't feel he needed it anymore   Psoriasis    Retinal tear    Right knee meniscal tear    Type 2 diabetes mellitus ([place])    Urinary hesitancy    Vitamin D deficiency     Past Surgical History:  Procedure Laterality Date   CATARACT EXTRACTION W/ INTRAOCULAR LENS  IMPLANT, BILATERAL  right 2015//  left  2012   CORONARY/GRAFT ACUTE MI REVASCULARIZATION N/A 10/30/2018   Procedure: Coronary/Graft Acute MI Revascularization;  Surgeon: Troy Sine, MD;  Location: [facility];  Service: Cardiovascular;  Laterality: N/A;   CORRECTION REPAIR MULTIPLE  TOES , LEFT FOOT  YRS AGO   EXTRACORPOREAL SHOCK WAVE LITHOTRIPSY  2000 approx   KNEE ARTHROSCOPY Left 2007   KNEE ARTHROSCOPY Right 02/10/2015   Procedure: ARTHROSCOPY RIGHT KNEE WITH DEBRIDEMENT AND CHRONDROPLASTY;  Surgeon: Gaynelle Arabian, MD;  Location: [facility];  Service: Orthopedics;  Laterality: Right;   LEFT HEART CATH AND CORONARY ANGIOGRAPHY N/A 10/30/2018   Procedure: LEFT HEART CATH AND CORONARY ANGIOGRAPHY;  Surgeon: Troy Sine, MD;  Location: [facility];  Service: Cardiovascular;  Laterality: N/A;   PARTIAL KNEE ARTHROPLASTY Right 07/19/2016   Procedure: RIGHT KNEE MEDICAL UNICOMPARTMENTAL ARTHROPLASTY;  Surgeon: Gaynelle Arabian, MD;  [facility];  Service: Orthopedics;  Laterality: Right;   RADICAL ORCHIECTOMY Left 1996   benign   REMOVAL SPURS RIGHT GREAT TOE  YRS AGO   TOTAL KNEE ARTHROPLASTY Left 09-16-2007    There were no vitals filed for this visit.   Subjective Assessment - 03/09/21 1522  Subjective Patient reports he is feeling better than he did last visit. Patient stated he did not complete HEP this weekend because he walked more than usual over the weekend. He stated he had a epidural shot yesterday (24+ hours have past since epidural shot).    Patient is accompained by: Family member    Pertinent History trouble sleeping, heart attack 2019, diabetes type 2, L4-5: Bilateral facet arthropathy with 7 mm anterolisthesis. Disc  degeneration with annular fissures and circumferential protrusion of  the disc. Stenosis of both lateral recesses and neural foramina that  could cause neural compression on either or both sides.    L5-S1: Disc degeneration with shallow disc protrusion but no   compressive effect upon the thecal sac or S1 nerves.    Limitations Sitting;Walking;Lifting;Standing    How long can you stand comfortably? limited 5-10,    How long can you walk comfortably? limited    Diagnostic tests fusion l4-l5 plan for patient.    Patient Stated Goals return to tennis, golf    Currently in Pain? Yes    Pain Score 5     Pain Location Back    Pain Orientation Right;Left    Pain Descriptors / Indicators Aching    Pain Type Chronic pain    Pain Onset More than a month ago    Pain Frequency Constant    Aggravating Factors  bending, standing, walking    Pain Relieving Factors laying down on side, leaning for support.    Effect of Pain on Daily Activities difficulty walking    Multiple Pain Sites Yes    Pain Score 3    Pain Location Neck    Pain Orientation Right;Left    Pain Descriptors / Indicators Aching    Pain Type Chronic pain    Pain Onset More than a month ago    Pain Frequency Intermittent    Aggravating Factors  turning head    Pain Relieving Factors rest              Pt seen for aquatic therapy today.  Treatment took place in water 4-4.5 ft in depth at the [facility]. Temp of water was 91.  Pt entered/exited the pool via stairs (step through pattern) independently with bilat rail.   Warm up: heel/toe walking x4 laps across pool  side stepping x4 laps from   Ambulation with noodle support: long strides, march 2 laps.  Exercises; Slow march x20; squats x20; hip extension x20; hip abduction x20.  board trunk flexion x20 lateral board rotation x20 each way seated   Steps step up x20 each leg; lateral step up x20 each leg;   Tandem gait for balance x2 across pool with yellow noodle. Standing marches with 1-2 sec hold for balance with yellow noodle.   Pt requires buoyancy for support and to offload joints with strengthening exercises. Viscosity of the water is needed for resistance of strengthening; water current perturbations  provides challenge to standing balance unsupported, requiring increased core activation.       PT Education - 03/09/21 1532     Education Details Reviewed aqautic therapy exercises and similar stretches to complete at home.    Person(s) Educated Patient    Methods Explanation;Demonstration;Tactile cues    Comprehension Verbalized understanding;Returned demonstration;Verbal cues required              PT Short Term Goals - 02/28/21 1505       PT SHORT TERM GOAL #1   Title  Patient will be indpendnet with basic pool program    Time 3    Period Weeks    Status New    Target Date 03/21/21      PT SHORT TERM GOAL #2   Title Patient will demonstrate 4+/5 gross bilateral LE strength    Time 3    Period Weeks    Status New    Target Date 03/21/21               PT Long Term Goals - 02/28/21 1506       PT LONG TERM GOAL #1   Title Patient will improve gait mechanics in order to amblate 700 feet with decreased fatigue in order to ambulate in the community.    Baseline limited walking    Time 4    Period Weeks    Status New    Target Date 03/28/21      PT LONG TERM GOAL #2   Title Patient will increase lower extremity strength in bilateral hips and knee in order to improve transfers from sitting to standing, and standing to sitting.    Baseline 3+/5 throughout bilateral hip (except adduction 4/5), 4-/5 throughout bilateral knee    Time 4    Period Weeks    Status New    Target Date 03/28/21      PT LONG TERM GOAL #3   Title Patient will improve lumbar and cervical range of motion by 10% in order to perform ADL's safely.    Baseline limited cervical rotation 35 Lt rotation; Rt rotation 40, lumbar rotation 85%, side bending 70%.    Time 4    Period Weeks    Status New    Target Date 03/28/21      PT LONG TERM GOAL #4   Title Patient will be independent with final HEP and demonstrate understanding of correct form with exercises and stretches.    Baseline Inital  HEP    Time 4    Period Weeks    Status New                   Plan - 03/10/21 0759     Clinical Impression Statement Patient tolerated aquatic therapy with no increase in pain or discomfort. Patient states that he felt sore at the end of the session but it was not a discomfort level. Patient should continue with therapy as tolerated to further increase strength, balance, and mobility.    Personal Factors and Comorbidities Comorbidity 1;Comorbidity 2    Comorbidities myocardial infarction HCC, diabetes type 2, dysautonomia, orthostatic hypotension (patient states it has not occured in over a year)    Examination-Activity Limitations Bathing;Bend;Carry;Stairs;Stand;Transfers    Examination-Participation Restrictions Cleaning;Community Activity;Yard Work;Other    Stability/Clinical Decision Making Evolving/Moderate complexity    Clinical Decision Making Moderate    Rehab Potential Good    PT Frequency 2x / week    PT Duration 4 weeks    PT Treatment/Interventions ADLs/Self Care Home Management;Aquatic Therapy;Contrast Bath;Gait training;Stair training;Electrical Stimulation;Cryotherapy;Functional mobility training;Therapeutic activities;Therapeutic exercise;Balance training;Traction;Moist Heat;Manual techniques;Taping;Energy conservation;Dry needling;Neuromuscular re-education;Passive range of motion;Patient/family education;Joint Manipulations    PT Next Visit Plan Aquatic therapy intro, improve gait mechanics and improve balance by increasing core strenght and LE strength, progress HEP to improve lower extremity strength in hips and knees, manuel therapy to reduce upper trap tightness; lower back tightness.    PT Home Exercise Plan 3x10 banded seated hip abdcution, 3x10 seated banded hip flexion, 3x10 seated banded knee extension;  all exercises done in yellow band    Consulted and Agree with Plan of Care Patient;Family member/caregiver    Family Member Consulted wife              Patient will benefit from skilled therapeutic intervention in order to improve the following deficits and impairments:  Abnormal gait, Decreased activity tolerance, Decreased balance, Decreased coordination, Decreased range of motion, Decreased mobility, Decreased endurance, Decreased strength, Difficulty walking, Hypomobility, Impaired sensation, Pain, Impaired UE functional use, Postural dysfunction  Visit Diagnosis: Chronic bilateral low back pain without sciatica  Other abnormalities of gait and mobility  Muscle weakness (generalized)  Cervicalgia     Problem List Patient Active Problem List   Diagnosis Date Noted   Orthostatic hypotension 01/30/2020   Hypertension 11/01/2018   Hyperlipidemia 99991111   Acute systolic heart failure ([place]) 11/01/2018   NSTEMI (non-ST elevated myocardial infarction) ([place]) 10/29/2018   Complex sleep apnea syndrome 07/10/2017   Treatment-emergent central sleep apnea 07/10/2017   Myocardial infarction ([place]) 07/10/2017   Excessive daytime sleepiness 05/31/2017   Snoring 05/31/2017   Coronary artery disease involving native coronary artery of native heart without angina pectoris 05/31/2017   Intermittent palpitations 05/31/2017   Perspiration-excessive 05/31/2017   Dysautonomia orthostatic hypotension syndrome 05/31/2017   OA (osteoarthritis) of knee 07/19/2016   Acute medial meniscal tear 02/10/2015    Billey Co SPT 03/10/2021, 8:27 AM  Carolyne Littles PT DPT  03/10/2021   During this treatment session, the therapist was present, participating in and directing the treatment.    [facility] [address] [place], [place], [address] Phone: [phone]   Fax:  [phone]  Name: Devin Combs MRN: [id number] Date of Birth: 22-Jun-1951

## 2021-03-15 ENCOUNTER — Ambulatory Visit (HOSPITAL_BASED_OUTPATIENT_CLINIC_OR_DEPARTMENT_OTHER): Payer: Medicare Other | Attending: Orthopedic Surgery | Admitting: Physical Therapy

## 2021-03-15 ENCOUNTER — Encounter (HOSPITAL_BASED_OUTPATIENT_CLINIC_OR_DEPARTMENT_OTHER): Payer: Self-pay | Admitting: Physical Therapy

## 2021-03-15 ENCOUNTER — Other Ambulatory Visit: Payer: Self-pay

## 2021-03-15 DIAGNOSIS — R2689 Other abnormalities of gait and mobility: Secondary | ICD-10-CM | POA: Diagnosis present

## 2021-03-15 DIAGNOSIS — M545 Low back pain, unspecified: Secondary | ICD-10-CM | POA: Insufficient documentation

## 2021-03-15 DIAGNOSIS — M542 Cervicalgia: Secondary | ICD-10-CM | POA: Insufficient documentation

## 2021-03-15 DIAGNOSIS — M6281 Muscle weakness (generalized): Secondary | ICD-10-CM | POA: Insufficient documentation

## 2021-03-15 DIAGNOSIS — G8929 Other chronic pain: Secondary | ICD-10-CM | POA: Diagnosis present

## 2021-03-15 NOTE — Therapy (Addendum)
[facility] [address] [place], [place], [address] Phone: [phone]   Fax:  [phone]  Physical Therapy Treatment  Patient Details  Name: Devin Combs MRN: [id number] Date of Birth: August 26, 1950 Referring Provider (PT): Melina Schools, MD   Encounter Date: 03/15/2021   PT End of Session - 03/15/21 1320     Visit Number 5    Number of Visits 8    Date for PT Re-Evaluation 04/11/21    Authorization Type Medicare A&B and Aetna Supplemental    PT Start Time 1159    PT Stop Time [id number]    PT Time Calculation (min) 43 min    Equipment Utilized During Treatment Other (comment)    Activity Tolerance Patient tolerated treatment well    Behavior During Therapy [facility] for tasks assessed/performed             Past Medical History:  Diagnosis Date   Acute systolic heart failure ([place])    Anxiety    Arthritis    Cancer ([place])    basal and squamous cell carcinoma   Chronic back pain    DDD (degenerative disc disease), thoracolumbar    Degenerative cervical spinal stenosis    Depression    Diabetic autonomic neuropathy ([place])    Dyspnea    with exertion    GERD (gastroesophageal reflux disease)    Headache    History of bronchitis    History of kidney stones    Hyperlipidemia    Hypertension    Hypogonadism in male    Myocardial infarction ([place])    Neuropathy    NSTEMI (non-ST elevated myocardial infarction) ([place])    10/29/2018- PCI/DESx1 to pLAD, EF 35%   Orthostatic hypotension    OSA (obstructive sleep apnea)    per pt study done 2006 (approx)  used cpap few yrs then stopped using, stated didn't feel he needed it anymore   Psoriasis    Retinal tear    Right knee meniscal tear    Type 2 diabetes mellitus ([place])    Urinary hesitancy    Vitamin D deficiency     Past Surgical History:  Procedure Laterality Date   CATARACT EXTRACTION W/ INTRAOCULAR LENS  IMPLANT, BILATERAL  right 2015//  left 2012    CORONARY/GRAFT ACUTE MI REVASCULARIZATION N/A 10/30/2018   Procedure: Coronary/Graft Acute MI Revascularization;  Surgeon: Troy Sine, MD;  Location: [facility];  Service: Cardiovascular;  Laterality: N/A;   CORRECTION REPAIR MULTIPLE  TOES , LEFT FOOT  YRS AGO   EXTRACORPOREAL SHOCK WAVE LITHOTRIPSY  2000 approx   KNEE ARTHROSCOPY Left 2007   KNEE ARTHROSCOPY Right 02/10/2015   Procedure: ARTHROSCOPY RIGHT KNEE WITH DEBRIDEMENT AND CHRONDROPLASTY;  Surgeon: Gaynelle Arabian, MD;  Location: [facility];  Service: Orthopedics;  Laterality: Right;   LEFT HEART CATH AND CORONARY ANGIOGRAPHY N/A 10/30/2018   Procedure: LEFT HEART CATH AND CORONARY ANGIOGRAPHY;  Surgeon: Troy Sine, MD;  Location: [facility];  Service: Cardiovascular;  Laterality: N/A;   PARTIAL KNEE ARTHROPLASTY Right 07/19/2016   Procedure: RIGHT KNEE MEDICAL UNICOMPARTMENTAL ARTHROPLASTY;  Surgeon: Gaynelle Arabian, MD;  [facility];  Service: Orthopedics;  Laterality: Right;   RADICAL ORCHIECTOMY Left 1996   benign   REMOVAL SPURS RIGHT GREAT TOE  YRS AGO   TOTAL KNEE ARTHROPLASTY Left 09-16-2007    There were no vitals filed for this visit.   Subjective Assessment - 03/15/21 1317     Subjective Patient  reports his lower back is feeling sore at time of visit. Patient does not recall doing anything to streneous over the weekend. Patient states that he neck is doing better than it normally feels.    Patient is accompained by: Family member    Pertinent History trouble sleeping, heart attack 2019, diabetes type 2, L4-5: Bilateral facet arthropathy with 7 mm anterolisthesis. Disc  degeneration with annular fissures and circumferential protrusion of  the disc. Stenosis of both lateral recesses and neural foramina that  could cause neural compression on either or both sides.    L5-S1: Disc degeneration with shallow disc protrusion but no  compressive effect upon the thecal sac or S1 nerves.     Limitations Sitting;Walking;Lifting;Standing    How long can you stand comfortably? limited 5-10,    How long can you walk comfortably? limited    Diagnostic tests fusion l4-l5 plan for patient.    Patient Stated Goals return to tennis, golf    Currently in Pain? Yes    Pain Score 5     Pain Location Back    Pain Orientation Right;Left    Pain Descriptors / Indicators Aching    Pain Type Chronic pain    Pain Radiating Towards n/a    Pain Onset More than a month ago    Pain Frequency Constant    Aggravating Factors  bending, standing, walking    Pain Relieving Factors laying down on side, leaning for support    Effect of Pain on Daily Activities difficulty walking    Multiple Pain Sites Yes    Pain Score 2    Pain Location Neck    Pain Orientation Right;Left    Pain Descriptors / Indicators Aching    Pain Type Chronic pain    Pain Radiating Towards n/a    Pain Onset More than a month ago    Pain Frequency Intermittent    Aggravating Factors  turning head    Pain Relieving Factors rest             Pt seen for aquatic therapy today.  Treatment took place in water 3.25-4 ft in depth at the [facility] pool. Temp of water was 91.  Pt entered/exited the pool via stairs (step through pattern) independently with bilat rail.  Warm up: heel/toe walking x4 laps across pool  side stepping x4 laps from   Ambulation with noodle support: long strides, march 2 laps   Exercises; Slow march x20; hip extension x20; hip abduction x20;  shoulder extensions with aquatic bar x20  board trunk flexion x20 lateral board rotation x20 each way seated   Steps step up x20 each leg; lateral step up x20 each leg;   Seated LAQ and March x20 3lbs   Pt requires buoyancy for support and to offload joints with strengthening exercises. Viscosity of the water is needed for resistance of strengthening; water current perturbations provides challenge to standing balance unsupported, requiring  increased core activation.        PT Education - 03/15/21 1319     Education Details Reviewed Aquatic therapy exercise plan.    Person(s) Educated Patient    Methods Explanation;Demonstration;Tactile cues    Comprehension Verbalized understanding;Returned demonstration;Verbal cues required              PT Short Term Goals - 02/28/21 1505       PT SHORT TERM GOAL #1   Title Patient will be indpendnet with basic pool program    Time 3  Period Weeks    Status New    Target Date 03/21/21      PT SHORT TERM GOAL #2   Title Patient will demonstrate 4+/5 gross bilateral LE strength    Time 3    Period Weeks    Status New    Target Date 03/21/21               PT Long Term Goals - 02/28/21 1506       PT LONG TERM GOAL #1   Title Patient will improve gait mechanics in order to amblate 700 feet with decreased fatigue in order to ambulate in the community.    Baseline limited walking    Time 4    Period Weeks    Status New    Target Date 03/28/21      PT LONG TERM GOAL #2   Title Patient will increase lower extremity strength in bilateral hips and knee in order to improve transfers from sitting to standing, and standing to sitting.    Baseline 3+/5 throughout bilateral hip (except adduction 4/5), 4-/5 throughout bilateral knee    Time 4    Period Weeks    Status New    Target Date 03/28/21      PT LONG TERM GOAL #3   Title Patient will improve lumbar and cervical range of motion by 10% in order to perform ADL's safely.    Baseline limited cervical rotation 35 Lt rotation; Rt rotation 40, lumbar rotation 85%, side bending 70%.    Time 4    Period Weeks    Status New    Target Date 03/28/21      PT LONG TERM GOAL #4   Title Patient will be independent with final HEP and demonstrate understanding of correct form with exercises and stretches.    Baseline Inital HEP    Time 4    Period Weeks    Status New                   Plan - 03/15/21 1321      Clinical Impression Statement Patient tolerated aquatic therapy with no increase in pain. Patient reports he did not feel more sore than he did when he first entered the pool. Therapy should continue in order to improve strength, balance and mobility.    Personal Factors and Comorbidities Comorbidity 1;Comorbidity 2    Comorbidities myocardial infarction HCC, diabetes type 2, dysautonomia, orthostatic hypotension (patient states it has not occured in over a year)    Examination-Activity Limitations Bathing;Bend;Carry;Stairs;Stand;Transfers    Examination-Participation Restrictions Cleaning;Community Activity;Yard Work;Other    Stability/Clinical Decision Making Evolving/Moderate complexity    Clinical Decision Making Moderate    Rehab Potential Good    PT Frequency 2x / week    PT Duration 4 weeks    PT Treatment/Interventions ADLs/Self Care Home Management;Aquatic Therapy;Contrast Bath;Gait training;Stair training;Electrical Stimulation;Cryotherapy;Functional mobility training;Therapeutic activities;Therapeutic exercise;Balance training;Traction;Moist Heat;Manual techniques;Taping;Energy conservation;Dry needling;Neuromuscular re-education;Passive range of motion;Patient/family education;Joint Manipulations    PT Next Visit Plan Aquatic therapy intro, improve gait mechanics and improve balance by increasing core strenght and LE strength, progress HEP to improve lower extremity strength in hips and knees, manuel therapy to reduce upper trap tightness; lower back tightness.    PT Home Exercise Plan 3x10 banded seated hip abdcution, 3x10 seated banded hip flexion, 3x10 seated banded knee extension; all exercises done in yellow band    Consulted and Agree with Plan of Care Patient;Family member/caregiver    Family Member  Consulted wife             Patient will benefit from skilled therapeutic intervention in order to improve the following deficits and impairments:  Abnormal gait, Decreased  activity tolerance, Decreased balance, Decreased coordination, Decreased range of motion, Decreased mobility, Decreased endurance, Decreased strength, Difficulty walking, Hypomobility, Impaired sensation, Pain, Impaired UE functional use, Postural dysfunction  Visit Diagnosis: Chronic bilateral low back pain without sciatica  Other abnormalities of gait and mobility  Muscle weakness (generalized)  Cervicalgia     Problem List Patient Active Problem List   Diagnosis Date Noted   Orthostatic hypotension 01/30/2020   Hypertension 11/01/2018   Hyperlipidemia 99991111   Acute systolic heart failure ([place]) 11/01/2018   NSTEMI (non-ST elevated myocardial infarction) ([place]) 10/29/2018   Complex sleep apnea syndrome 07/10/2017   Treatment-emergent central sleep apnea 07/10/2017   Myocardial infarction ([place]) 07/10/2017   Excessive daytime sleepiness 05/31/2017   Snoring 05/31/2017   Coronary artery disease involving native coronary artery of native heart without angina pectoris 05/31/2017   Intermittent palpitations 05/31/2017   Perspiration-excessive 05/31/2017   Dysautonomia orthostatic hypotension syndrome 05/31/2017   OA (osteoarthritis) of knee 07/19/2016   Acute medial meniscal tear 02/10/2015   Carolyne Littles DPT  04/04/2021    Billey Co SPT 03/15/2021, 1:50 PM During this treatment session, the therapist was present, participating in and directing the treatment.     [facility] [address] [place], [place], [address] Phone: [phone]   Fax:  [phone]  Name: Joeziah Salem MRN: [id number] Date of Birth: [birth year]

## 2021-03-16 ENCOUNTER — Ambulatory Visit (HOSPITAL_BASED_OUTPATIENT_CLINIC_OR_DEPARTMENT_OTHER): Payer: Self-pay | Admitting: Physical Therapy

## 2021-03-18 ENCOUNTER — Encounter (HOSPITAL_BASED_OUTPATIENT_CLINIC_OR_DEPARTMENT_OTHER): Payer: Self-pay | Admitting: Physical Therapy

## 2021-03-18 ENCOUNTER — Other Ambulatory Visit: Payer: Self-pay

## 2021-03-18 ENCOUNTER — Ambulatory Visit (HOSPITAL_BASED_OUTPATIENT_CLINIC_OR_DEPARTMENT_OTHER): Payer: Medicare Other | Admitting: Physical Therapy

## 2021-03-18 DIAGNOSIS — G8929 Other chronic pain: Secondary | ICD-10-CM

## 2021-03-18 DIAGNOSIS — M542 Cervicalgia: Secondary | ICD-10-CM

## 2021-03-18 DIAGNOSIS — M545 Low back pain, unspecified: Secondary | ICD-10-CM | POA: Diagnosis not present

## 2021-03-18 DIAGNOSIS — M6281 Muscle weakness (generalized): Secondary | ICD-10-CM

## 2021-03-18 DIAGNOSIS — R2689 Other abnormalities of gait and mobility: Secondary | ICD-10-CM

## 2021-03-18 NOTE — Therapy (Signed)
[facility] [address] [place], [place], [address] Phone: [phone]   Fax:  [phone]  Physical Therapy Treatment  Patient Details  Name: Devin Combs MRN: [id number] Date of Birth: 12-29-1950 Referring Provider (PT): Melina Schools, MD   Encounter Date: 03/18/2021   PT End of Session - 03/18/21 1249     Visit Number 6    Number of Visits 8    Date for PT Re-Evaluation 04/11/21    Authorization Type Medicare A&B and Aetna Supplemental    PT Start Time 1103    PT Stop Time [id number]    PT Time Calculation (min) 42 min    Equipment Utilized During Treatment Other (comment)   aquatic equipment   Activity Tolerance Patient tolerated treatment well    Behavior During Therapy [facility] for tasks assessed/performed             Past Medical History:  Diagnosis Date   Acute systolic heart failure ([place])    Anxiety    Arthritis    Cancer ([place])    basal and squamous cell carcinoma   Chronic back pain    DDD (degenerative disc disease), thoracolumbar    Degenerative cervical spinal stenosis    Depression    Diabetic autonomic neuropathy ([place])    Dyspnea    with exertion    GERD (gastroesophageal reflux disease)    Headache    History of bronchitis    History of kidney stones    Hyperlipidemia    Hypertension    Hypogonadism in male    Myocardial infarction ([place])    Neuropathy    NSTEMI (non-ST elevated myocardial infarction) ([place])    10/29/2018- PCI/DESx1 to pLAD, EF 35%   Orthostatic hypotension    OSA (obstructive sleep apnea)    per pt study done 2006 (approx)  used cpap few yrs then stopped using, stated didn't feel he needed it anymore   Psoriasis    Retinal tear    Right knee meniscal tear    Type 2 diabetes mellitus ([place])    Urinary hesitancy    Vitamin D deficiency     Past Surgical History:  Procedure Laterality Date   CATARACT EXTRACTION W/ INTRAOCULAR LENS  IMPLANT, BILATERAL  right 2015//  left  2012   CORONARY/GRAFT ACUTE MI REVASCULARIZATION N/A 10/30/2018   Procedure: Coronary/Graft Acute MI Revascularization;  Surgeon: Troy Sine, MD;  Location: [facility];  Service: Cardiovascular;  Laterality: N/A;   CORRECTION REPAIR MULTIPLE  TOES , LEFT FOOT  YRS AGO   EXTRACORPOREAL SHOCK WAVE LITHOTRIPSY  2000 approx   KNEE ARTHROSCOPY Left 2007   KNEE ARTHROSCOPY Right 02/10/2015   Procedure: ARTHROSCOPY RIGHT KNEE WITH DEBRIDEMENT AND CHRONDROPLASTY;  Surgeon: Gaynelle Arabian, MD;  Location: [facility];  Service: Orthopedics;  Laterality: Right;   LEFT HEART CATH AND CORONARY ANGIOGRAPHY N/A 10/30/2018   Procedure: LEFT HEART CATH AND CORONARY ANGIOGRAPHY;  Surgeon: Troy Sine, MD;  Location: [facility];  Service: Cardiovascular;  Laterality: N/A;   PARTIAL KNEE ARTHROPLASTY Right 07/19/2016   Procedure: RIGHT KNEE MEDICAL UNICOMPARTMENTAL ARTHROPLASTY;  Surgeon: Gaynelle Arabian, MD;  [facility];  Service: Orthopedics;  Laterality: Right;   RADICAL ORCHIECTOMY Left 1996   benign   REMOVAL SPURS RIGHT GREAT TOE  YRS AGO   TOTAL KNEE ARTHROPLASTY Left 09-16-2007    There were no vitals filed for this visit.   Subjective Assessment - 03/18/21 1245  Subjective Patient reports feeling sore throughout his body and has difficulty sleeping. Patient had soreness in his shoulder and back that bothered him more than other joints.    Patient is accompained by: Family member    Pertinent History trouble sleeping, heart attack 2019, diabetes type 2, L4-5: Bilateral facet arthropathy with 7 mm anterolisthesis. Disc  degeneration with annular fissures and circumferential protrusion of  the disc. Stenosis of both lateral recesses and neural foramina that  could cause neural compression on either or both sides.    L5-S1: Disc degeneration with shallow disc protrusion but no  compressive effect upon the thecal sac or S1 nerves.    Limitations  Sitting;Walking;Lifting;Standing    How long can you stand comfortably? limited 5-10,    How long can you walk comfortably? limited    Diagnostic tests fusion l4-l5 plan for patient.    Patient Stated Goals return to tennis, golf    Currently in Pain? Yes    Pain Score 5     Pain Location Back    Pain Orientation Right;Left    Pain Descriptors / Indicators Aching    Pain Type Chronic pain    Pain Onset More than a month ago    Pain Frequency Constant    Aggravating Factors  bending, walking, standing    Pain Relieving Factors laying down on side, leaning for support    Effect of Pain on Daily Activities difficulty walking    Multiple Pain Sites Yes    Pain Score 2    Pain Location Neck    Pain Orientation Right;Left    Pain Descriptors / Indicators Aching    Pain Type Chronic pain    Pain Onset More than a month ago    Pain Frequency Intermittent    Aggravating Factors  turning head    Pain Relieving Factors rest              Pt seen for aquatic therapy today.  Treatment took place in water 3.25-4 ft in depth at the [facility] pool. Temp of water was 91.  Pt entered/exited the pool via stairs (step through pattern) independently with bilat rail.  Warm up: heel/toe walking x4 laps across pool  side stepping x4 laps from   Ambulation with noodle support: long strides, march 2 laps   Exercises; Slow march x20; hip extension x20; hip abduction x20.   Seated LAQ and March x20.Seated board flexion x20. Seated board press down x20.   Tandem Stance eyes open and eyes closed   Narrow base eyes open and eyes closed  Tandem gait for balance x2 across pool.   With neck noddle and foot float; push pull x20; side to side perturbations from the feet x20; press and push x20  Pt requires buoyancy for support and to offload joints with strengthening exercises. Viscosity of the water is needed for resistance of strengthening; water current perturbations provides challenge to  standing balance unsupported, requiring increased core activation.;      PT Education - 03/18/21 1248     Education Details Reviewed aquatic therapy stretches and thera-ex. Patient was also educated on symptom management.    Person(s) Educated Patient    Methods Explanation;Demonstration;Verbal cues    Comprehension Verbalized understanding;Returned demonstration;Verbal cues required              PT Short Term Goals - 02/28/21 1505       PT SHORT TERM GOAL #1   Title Patient will be indpendnet with  basic pool program    Time 3    Period Weeks    Status New    Target Date 03/21/21      PT SHORT TERM GOAL #2   Title Patient will demonstrate 4+/5 gross bilateral LE strength    Time 3    Period Weeks    Status New    Target Date 03/21/21               PT Long Term Goals - 02/28/21 1506       PT LONG TERM GOAL #1   Title Patient will improve gait mechanics in order to amblate 700 feet with decreased fatigue in order to ambulate in the community.    Baseline limited walking    Time 4    Period Weeks    Status New    Target Date 03/28/21      PT LONG TERM GOAL #2   Title Patient will increase lower extremity strength in bilateral hips and knee in order to improve transfers from sitting to standing, and standing to sitting.    Baseline 3+/5 throughout bilateral hip (except adduction 4/5), 4-/5 throughout bilateral knee    Time 4    Period Weeks    Status New    Target Date 03/28/21      PT LONG TERM GOAL #3   Title Patient will improve lumbar and cervical range of motion by 10% in order to perform ADL's safely.    Baseline limited cervical rotation 35 Lt rotation; Rt rotation 40, lumbar rotation 85%, side bending 70%.    Time 4    Period Weeks    Status New    Target Date 03/28/21      PT LONG TERM GOAL #4   Title Patient will be independent with final HEP and demonstrate understanding of correct form with exercises and stretches.    Baseline Inital HEP     Time 4    Period Weeks    Status New                   Plan - 03/18/21 1250     Clinical Impression Statement Patient tolerated aquatic therapy with no increase in pain or discomfort. Patient reported feeling less sore after end of treatment session. Treatment session consisted of lighter strengtheing exercises to not increase pain. Therapy should continue to improve strength and balance while monitering symptoms.    Personal Factors and Comorbidities Comorbidity 1;Comorbidity 2    Comorbidities myocardial infarction HCC, diabetes type 2, dysautonomia, orthostatic hypotension (patient states it has not occured in over a year)    Examination-Activity Limitations Bathing;Bend;Carry;Stairs;Stand;Transfers    Examination-Participation Restrictions Cleaning;Community Activity;Yard Work;Other    Stability/Clinical Decision Making Evolving/Moderate complexity    Clinical Decision Making Moderate    Rehab Potential Good    PT Frequency 2x / week    PT Duration 4 weeks    PT Treatment/Interventions ADLs/Self Care Home Management;Aquatic Therapy;Contrast Bath;Gait training;Stair training;Electrical Stimulation;Cryotherapy;Functional mobility training;Therapeutic activities;Therapeutic exercise;Balance training;Traction;Moist Heat;Manual techniques;Taping;Energy conservation;Dry needling;Neuromuscular re-education;Passive range of motion;Patient/family education;Joint Manipulations    PT Next Visit Plan Aquatic therapy intro, improve gait mechanics and improve balance by increasing core strenght and LE strength, progress HEP to improve lower extremity strength in hips and knees, manuel therapy to reduce upper trap tightness; lower back tightness.    PT Home Exercise Plan 3x10 banded seated hip abdcution, 3x10 seated banded hip flexion, 3x10 seated banded knee extension; all exercises done in yellow band  Consulted and Agree with Plan of Care Patient;Family member/caregiver    Family Member  Consulted wife             Patient will benefit from skilled therapeutic intervention in order to improve the following deficits and impairments:  Abnormal gait, Decreased activity tolerance, Decreased balance, Decreased coordination, Decreased range of motion, Decreased mobility, Decreased endurance, Decreased strength, Difficulty walking, Hypomobility, Impaired sensation, Pain, Impaired UE functional use, Postural dysfunction  Visit Diagnosis: Chronic bilateral low back pain without sciatica  Other abnormalities of gait and mobility  Muscle weakness (generalized)  Cervicalgia     Problem List Patient Active Problem List   Diagnosis Date Noted   Orthostatic hypotension 01/30/2020   Hypertension 11/01/2018   Hyperlipidemia 99991111   Acute systolic heart failure ([place]) 11/01/2018   NSTEMI (non-ST elevated myocardial infarction) ([place]) 10/29/2018   Complex sleep apnea syndrome 07/10/2017   Treatment-emergent central sleep apnea 07/10/2017   Myocardial infarction ([place]) 07/10/2017   Excessive daytime sleepiness 05/31/2017   Snoring 05/31/2017   Coronary artery disease involving native coronary artery of native heart without angina pectoris 05/31/2017   Intermittent palpitations 05/31/2017   Perspiration-excessive 05/31/2017   Dysautonomia orthostatic hypotension syndrome 05/31/2017   OA (osteoarthritis) of knee 07/19/2016   Acute medial meniscal tear 02/10/2015   Carolyne Littles PT DPT  03/18/2021  Billey Co SPT 03/18/2021, 12:58 PM  During this treatment session, the therapist was present, participating in and directing the treatment.   [facility] [address] [place], [place], [address] Phone: [phone]   Fax:  [phone]  Name: Itzel Schilling MRN: [id number] Date of Birth: [birth year]

## 2021-03-21 ENCOUNTER — Ambulatory Visit (HOSPITAL_BASED_OUTPATIENT_CLINIC_OR_DEPARTMENT_OTHER): Payer: Medicare Other | Admitting: Physical Therapy

## 2021-03-21 ENCOUNTER — Encounter (HOSPITAL_BASED_OUTPATIENT_CLINIC_OR_DEPARTMENT_OTHER): Payer: Self-pay | Admitting: Physical Therapy

## 2021-03-21 ENCOUNTER — Other Ambulatory Visit: Payer: Self-pay

## 2021-03-21 DIAGNOSIS — R2689 Other abnormalities of gait and mobility: Secondary | ICD-10-CM

## 2021-03-21 DIAGNOSIS — M545 Low back pain, unspecified: Secondary | ICD-10-CM

## 2021-03-21 DIAGNOSIS — M542 Cervicalgia: Secondary | ICD-10-CM

## 2021-03-21 DIAGNOSIS — G8929 Other chronic pain: Secondary | ICD-10-CM

## 2021-03-21 DIAGNOSIS — M6281 Muscle weakness (generalized): Secondary | ICD-10-CM

## 2021-03-21 NOTE — Addendum Note (Signed)
Addended by: Carney Living on: 03/21/2021 05:03 PM   Modules accepted: Orders

## 2021-03-21 NOTE — Therapy (Addendum)
[facility] [address] [place], [place], [address] Phone: [phone]   Fax:  [phone]  Physical Therapy Treatment/Progress Note   Patient Details  Name: Devin Combs MRN: [id number] Date of Birth: September 17, 1950 Referring Provider (PT): Melina Schools, MD   Encounter Date: 03/21/2021   PT End of Session - 03/21/21 1021     Visit Number 7    Number of Visits 19    Date for PT Re-Evaluation 05/02/21    Authorization Type Medicare A&B and Aetna Supplemental    PT Start Time 1018    PT Stop Time 1059    PT Time Calculation (min) 41 min    Activity Tolerance Patient tolerated treatment well    Behavior During Therapy [facility] for tasks assessed/performed             Past Medical History:  Diagnosis Date   Acute systolic heart failure ([place])    Anxiety    Arthritis    Cancer ([place])    basal and squamous cell carcinoma   Chronic back pain    DDD (degenerative disc disease), thoracolumbar    Degenerative cervical spinal stenosis    Depression    Diabetic autonomic neuropathy ([facility])    Dyspnea    with exertion    GERD (gastroesophageal reflux disease)    Headache    History of bronchitis    History of kidney stones    Hyperlipidemia    Hypertension    Hypogonadism in male    Myocardial infarction ([place])    Neuropathy    NSTEMI (non-ST elevated myocardial infarction) ([place])    10/29/2018- PCI/DESx1 to pLAD, EF 35%   Orthostatic hypotension    OSA (obstructive sleep apnea)    per pt study done 2006 (approx)  used cpap few yrs then stopped using, stated didn't feel he needed it anymore   Psoriasis    Retinal tear    Right knee meniscal tear    Type 2 diabetes mellitus ([place])    Urinary hesitancy    Vitamin D deficiency     Past Surgical History:  Procedure Laterality Date   CATARACT EXTRACTION W/ INTRAOCULAR LENS  IMPLANT, BILATERAL  right 2015//  left 2012   CORONARY/GRAFT ACUTE MI REVASCULARIZATION N/A  10/30/2018   Procedure: Coronary/Graft Acute MI Revascularization;  Surgeon: Troy Sine, MD;  Location: [facility];  Service: Cardiovascular;  Laterality: N/A;   CORRECTION REPAIR MULTIPLE  TOES , LEFT FOOT  YRS AGO   EXTRACORPOREAL SHOCK WAVE LITHOTRIPSY  2000 approx   KNEE ARTHROSCOPY Left 2007   KNEE ARTHROSCOPY Right 02/10/2015   Procedure: ARTHROSCOPY RIGHT KNEE WITH DEBRIDEMENT AND CHRONDROPLASTY;  Surgeon: Gaynelle Arabian, MD;  Location: [facility];  Service: Orthopedics;  Laterality: Right;   LEFT HEART CATH AND CORONARY ANGIOGRAPHY N/A 10/30/2018   Procedure: LEFT HEART CATH AND CORONARY ANGIOGRAPHY;  Surgeon: Troy Sine, MD;  Location: [facility];  Service: Cardiovascular;  Laterality: N/A;   PARTIAL KNEE ARTHROPLASTY Right 07/19/2016   Procedure: RIGHT KNEE MEDICAL UNICOMPARTMENTAL ARTHROPLASTY;  Surgeon: Gaynelle Arabian, MD;  Location[facility];  Service: Orthopedics;  Laterality: Right;   RADICAL ORCHIECTOMY Left 1996   benign   REMOVAL SPURS RIGHT GREAT TOE  YRS AGO   TOTAL KNEE ARTHROPLASTY Left 09-16-2007    There were no vitals filed for this visit.   Subjective Assessment - 03/21/21 1106     Subjective Patient reports he has been feeling a little  better. He hasn't had as much pain in his shoulders. He will be going to the MD tomorrow. His pain is mostly in his back today. He continues to have difficulty sleeping.    Pertinent History trouble sleeping, heart attack 2019, diabetes type 2, L4-5: Bilateral facet arthropathy with 7 mm anterolisthesis. Disc  degeneration with annular fissures and circumferential protrusion of  the disc. Stenosis of both lateral recesses and neural foramina that  could cause neural compression on either or both sides.    L5-S1: Disc degeneration with shallow disc protrusion but no  compressive effect upon the thecal sac or S1 nerves.    Limitations Sitting;Walking;Lifting;Standing    How long can you stand  comfortably? limited 5-10,    How long can you walk comfortably? limited    Diagnostic tests fusion l4-l5 plan for patient.    Patient Stated Goals return to tennis, golf    Currently in Pain? Yes    Pain Score 5     Pain Location Back    Pain Orientation Right;Left    Pain Descriptors / Indicators Aching    Pain Type Chronic pain    Pain Onset More than a month ago    Pain Frequency Constant    Aggravating Factors  bending, walking , and standing    Pain Relieving Factors lyin gdown on his side    Effect of Pain on Daily Activities difficulty walking    Multiple Pain Sites No                [facility] PT Assessment - 03/21/21 0001       AROM   Cervical - Right Rotation 50    Cervical - Left Rotation 40    Lumbar - Right Side Bend 50% limited    Lumbar - Right Rotation 25%    Lumbar - Left Rotation 50% limited with pain      Strength   Right Hip ABduction 4/5    Right Hip ADduction 5/5    Left Hip Flexion 4-/5    Left Hip ABduction 4/5    Left Hip ADduction 5/5    Right Knee Flexion 4/5    Right Knee Extension 4+/5    Left Knee Flexion 4+/5    Left Knee Extension 4+/5             Pt seen for aquatic therapy today.  Treatment took place in water 3.25-4 ft in depth at the [facility] pool. Temp of water was 91.  Pt entered/exited the pool via stairs (step through pattern) independently with bilat rail.  Warm up: heel/toe walking x4 laps across pool  side stepping x4 laps from   Ambulation with noodle support: long strides, march 2 laps   Exercises; Slow march x20; hip abduction x20. Heel raises.   board trunk flexion x20 lateral board rotation x20 each way seated.  Steps step up x20 each leg; lateral step up x20 each leg;   Tandem Stance eyes open and eyes closed  3x30 secs each way.  Narrow base eyes open and eyes closed 3x20 sec each way.   Pt requires buoyancy for support and to offload joints with strengthening exercises. Viscosity of the water  is needed for resistance of strengthening; water current perturbations provides challenge to standing balance unsupported, requiring increased core activation.    PT Education - 03/21/21 1357     Education Details Reviewd therapy stretches and symptom mangement techniques.    Person(s) Educated Patient  Methods Explanation;Demonstration;Verbal cues    Comprehension Verbalized understanding;Returned demonstration;Verbal cues required              PT Short Term Goals - 03/21/21 1401       PT SHORT TERM GOAL #1   Title Patient will be indpendnet with basic pool program    Time 3    Period Weeks    Status On-going    Target Date 04/11/21      PT SHORT TERM GOAL #2   Title Patient will demonstrate 4+/5 gross bilateral LE strength    Time 3    Period Weeks    Status Achieved    Target Date 03/21/21      PT SHORT TERM GOAL #3   Title Patient will show improved cervical range of motion at 55 for left and right rotation.    Baseline 50/45    Time 3    Period Weeks    Status New    Target Date 04/11/21               PT Long Term Goals - 02/28/21 1506       PT LONG TERM GOAL #1   Title Patient will improve gait mechanics in order to amblate 700 feet with decreased fatigue in order to ambulate in the community.    Baseline limited walking    Time 4    Period Weeks    Status New    Target Date 03/28/21      PT LONG TERM GOAL #2   Title Patient will increase lower extremity strength in bilateral hips and knee in order to improve transfers from sitting to standing, and standing to sitting.    Baseline 3+/5 throughout bilateral hip (except adduction 4/5), 4-/5 throughout bilateral knee    Time 4    Period Weeks    Status New    Target Date 03/28/21      PT LONG TERM GOAL #3   Title Patient will improve lumbar and cervical range of motion by 10% in order to perform ADL's safely.    Baseline limited cervical rotation 35 Lt rotation; Rt rotation 40, lumbar rotation  85%, side bending 70%.    Time 4    Period Weeks    Status New    Target Date 03/28/21      PT LONG TERM GOAL #4   Title Patient will be independent with final HEP and demonstrate understanding of correct form with exercises and stretches.    Baseline Inital HEP    Time 4    Period Weeks    Status New                   Plan - 03/21/21 1133     Clinical Impression Statement Patient has been seen for 7 visits in the pool since intial eval. He has shown progression with cervical and lumbar motion. His strength in his legs have improved. He continues to have pain with activity. Funtionally he is still limited with gait distance and standing time but it varies from day to day. He will see the MD on 8/8.Marland Kitchen His overall expectiation is surgery at some point. If that is that plan we will continue to strengthen and improve mobility until that point. We will continue 2W6. He tolerated pool exercises well today. Therapy focused on core and UE exercises. Treatment also focused on self care and education on symptoms and progress since intial evalulation.    Personal Factors  and Comorbidities Comorbidity 1;Comorbidity 2    Comorbidities myocardial infarction HCC, diabetes type 2, dysautonomia, orthostatic hypotension (patient states it has not occured in over a year)    Examination-Activity Limitations Bathing;Bend;Carry;Stairs;Stand;Transfers    Stability/Clinical Decision Making Evolving/Moderate complexity    Clinical Decision Making Moderate    Rehab Potential Good    PT Frequency 2x / week    PT Duration 6 weeks    PT Treatment/Interventions ADLs/Self Care Home Management;Aquatic Therapy;Contrast Bath;Gait training;Stair training;Electrical Stimulation;Cryotherapy;Functional mobility training;Therapeutic activities;Therapeutic exercise;Balance training;Traction;Moist Heat;Manual techniques;Taping;Energy conservation;Dry needling;Neuromuscular re-education;Passive range of  motion;Patient/family education;Joint Manipulations    PT Next Visit Plan Aquatic therapy intro, improve gait mechanics and improve balance by increasing core strenght and LE strength, progress HEP to improve lower extremity strength in hips and knees, manuel therapy to reduce upper trap tightness; lower back tightness.    PT Home Exercise Plan 3x10 banded seated hip abdcution, 3x10 seated banded hip flexion, 3x10 seated banded knee extension; all exercises done in yellow band    Consulted and Agree with Plan of Care Patient;Family member/caregiver    Family Member Consulted wife             Patient will benefit from skilled therapeutic intervention in order to improve the following deficits and impairments:  Abnormal gait, Decreased activity tolerance, Decreased balance, Decreased coordination, Decreased range of motion, Decreased mobility, Decreased endurance, Decreased strength, Difficulty walking, Hypomobility, Impaired sensation, Pain, Impaired UE functional use, Postural dysfunction  Visit Diagnosis: Chronic bilateral low back pain without sciatica  Other abnormalities of gait and mobility  Muscle weakness (generalized)  Cervicalgia     Problem List Patient Active Problem List   Diagnosis Date Noted   Orthostatic hypotension 01/30/2020   Hypertension 11/01/2018   Hyperlipidemia 99991111   Acute systolic heart failure ([place]) 11/01/2018   NSTEMI (non-ST elevated myocardial infarction) ([place]) 10/29/2018   Complex sleep apnea syndrome 07/10/2017   Treatment-emergent central sleep apnea 07/10/2017   Myocardial infarction ([place]) 07/10/2017   Excessive daytime sleepiness 05/31/2017   Snoring 05/31/2017   Coronary artery disease involving native coronary artery of native heart without angina pectoris 05/31/2017   Intermittent palpitations 05/31/2017   Perspiration-excessive 05/31/2017   Dysautonomia orthostatic hypotension syndrome 05/31/2017   OA (osteoarthritis) of knee  07/19/2016   Acute medial meniscal tear 02/10/2015    Carolyne Littles PT DPT  03/21/2021   Davis Gourd   03/21/2021, 4:54 PM  During this treatment session, the therapist was present, participating in and directing the treatment.       [facility] [address] [place], [place], [address] Phone: [phone]   Fax:  [phone]  Name: Devin Combs MRN: [id number] Date of Birth: 06-11-1951

## 2021-03-23 ENCOUNTER — Ambulatory Visit (HOSPITAL_BASED_OUTPATIENT_CLINIC_OR_DEPARTMENT_OTHER): Payer: Medicare Other | Admitting: Physical Therapy

## 2021-03-24 ENCOUNTER — Ambulatory Visit (INDEPENDENT_AMBULATORY_CARE_PROVIDER_SITE_OTHER): Payer: Medicare Other | Admitting: Internal Medicine

## 2021-03-24 ENCOUNTER — Other Ambulatory Visit: Payer: Self-pay

## 2021-03-24 VITALS — BP 166/88 | HR 82 | Ht 73.0 in | Wt 246.0 lb

## 2021-03-24 DIAGNOSIS — I951 Orthostatic hypotension: Secondary | ICD-10-CM

## 2021-03-24 DIAGNOSIS — R002 Palpitations: Secondary | ICD-10-CM | POA: Diagnosis not present

## 2021-03-24 NOTE — Patient Instructions (Addendum)
Medication Instructions:  Your physician has recommended you make the following change in your medication:   ** Stop Plavix   *If you need a refill on your cardiac medications before your next appointment, please call your pharmacy*   Lab Work: None ordered.  If you have labs (blood work) drawn today and your tests are completely normal, you will receive your results only by: [facility] (if you have MyChart) OR A paper copy in the mail If you have any lab test that is abnormal or we need to change your treatment, we will call you to review the results.   Testing/Procedures: None ordered.    Follow-Up: At [facility], you and your health needs are our priority.  As part of our continuing mission to provide you with exceptional heart care, we have created designated Provider Care Teams.  These Care Teams include your primary Cardiologist (physician) and Advanced Practice Providers (APPs -  Physician Assistants and Nurse Practitioners) who all work together to provide you with the care you need, when you need it.  We recommend signing up for the patient portal called "MyChart".  Sign up information is provided on this After Visit Summary.  MyChart is used to connect with patients for Virtual Visits (Telemedicine).  Patients are able to view lab/test results, encounter notes, upcoming appointments, etc.  Non-urgent messages can be sent to your provider as well.   To learn more about what you can do with MyChart, go to [email or web address].    Your next appointment:   Follow up with Dr Caryl Comes as needed

## 2021-03-24 NOTE — Progress Notes (Signed)
Patient ID: Devin Combs, male   DOB: 1950/08/24, 70 y.o.   MRN: [id number]      Patient Care Team: Maury Dus, MD as PCP - General (Family Medicine) Sherren Mocha, MD as PCP - Cardiology (Cardiology)   HPI  Devin Combs is a 70 y.o. male Seen in follow-up for symptomatic orthostatic hypotension occurring in the context of diabetes with autonomic neuropathy.   Presented with Acute MI 3/20  LAD occluded>>stent; EF depressed  reassessment as below      [place] patient denies chest pain, shortness of breath, nocturnal dyspnea, orthopnea or peripheral edema.  There have been no palpitations, lightheadedness or syncope.  Complains of significant back pain which is much more limiting than cardiorespiratory symptoms.  Is having been said, he has noted no change in exercise tolerance.  He is able to walk the grocery stores.  He is able to exercise and to do aqua PT for up to an hour.  Blood pressure at home is typically 130  DATE TEST EF   7/17 Echo   55-65 %   7/17 Myoview   65 % No ischemia  3/20 Echo  35-40%   3/20 LHC 40-45% LADp-T>>stent; LADm-95%>>stent CX 10-20%; RCA-10-20%  7/21 Echo 55-60%    Date Cr K LDL Hgb  3/20 1.11 3.4 153 12.1  2/21 1.1 4.5 70 14.8            Past Medical History:  Diagnosis Date   Acute systolic heart failure ([facility])    Anxiety    Arthritis    Cancer ([facility])    basal and squamous cell carcinoma   Chronic back pain    DDD (degenerative disc disease), thoracolumbar    Degenerative cervical spinal stenosis    Depression    Diabetic autonomic neuropathy ([facility])    Dyspnea    with exertion    GERD (gastroesophageal reflux disease)    Headache    History of bronchitis    History of kidney stones    Hyperlipidemia    Hypertension    Hypogonadism in male    Myocardial infarction [facility])    Neuropathy    NSTEMI (non-ST elevated myocardial infarction) ([place])    10/29/2018- PCI/DESx1 to pLAD, EF 35%   Orthostatic hypotension     OSA (obstructive sleep apnea)    per pt study done 2006 (approx)  used cpap few yrs then stopped using, stated didn't feel he needed it anymore   Psoriasis    Retinal tear    Right knee meniscal tear    Type 2 diabetes mellitus ([place])    Urinary hesitancy    Vitamin D deficiency     Past Surgical History:  Procedure Laterality Date   CATARACT EXTRACTION W/ INTRAOCULAR LENS  IMPLANT, BILATERAL  right 2015//  left 2012   CORONARY/GRAFT ACUTE MI REVASCULARIZATION N/A 10/30/2018   Procedure: Coronary/Graft Acute MI Revascularization;  Surgeon: Troy Sine, MD;  Location: [facility];  Service: Cardiovascular;  Laterality: N/A;   CORRECTION REPAIR MULTIPLE  TOES , LEFT FOOT  YRS AGO   EXTRACORPOREAL SHOCK WAVE LITHOTRIPSY  2000 approx   KNEE ARTHROSCOPY Left 2007   KNEE ARTHROSCOPY Right 02/10/2015   Procedure: ARTHROSCOPY RIGHT KNEE WITH DEBRIDEMENT AND CHRONDROPLASTY;  Surgeon: Gaynelle Arabian, MD;  Location: [facility];  Service: Orthopedics;  Laterality: Right;   LEFT HEART CATH AND CORONARY ANGIOGRAPHY N/A 10/30/2018   Procedure: LEFT HEART CATH AND CORONARY ANGIOGRAPHY;  Surgeon: Troy Sine, MD;  Location: [facility];  Service: Cardiovascular;  Laterality: N/A;   PARTIAL KNEE ARTHROPLASTY Right 07/19/2016   Procedure: RIGHT KNEE MEDICAL UNICOMPARTMENTAL ARTHROPLASTY;  Surgeon: Gaynelle Arabian, MD;  [facility];  Service: Orthopedics;  Laterality: Right;   RADICAL ORCHIECTOMY Left 1996   benign   REMOVAL SPURS RIGHT GREAT TOE  YRS AGO   TOTAL KNEE ARTHROPLASTY Left 09-16-2007    Current Outpatient Medications  Medication Sig Dispense Refill   amphetamine-dextroamphetamine (ADDERALL XR) 30 MG 24 hr capsule Take 30 mg by mouth daily.     Apoaequorin (PREVAGEN) 10 MG CAPS Take 10 mg by mouth daily.      aspirin EC 81 MG EC tablet Take 1 tablet (81 mg total) by mouth daily. 30 tablet 1   atorvastatin (LIPITOR) 80 MG tablet TAKE 1 TABLET BY MOUTH  EVERY DAY AT 6PM 90 tablet 0   buPROPion (WELLBUTRIN XL) 300 MG 24 hr tablet Take 300 mg by mouth daily.     carvedilol (COREG) 6.25 MG tablet TAKE 1 TABLET (6.25 MG TOTAL) BY MOUTH 2 (TWO) TIMES DAILY WITH A MEAL. 180 tablet 2   clopidogrel (PLAVIX) 75 MG tablet TAKE 1 TABLET BY MOUTH EVERY DAY 90 tablet 3   DULoxetine (CYMBALTA) 60 MG capsule Take 60 mg by mouth every evening.     fentaNYL (DURAGESIC) 75 MCG/HR Place 1 patch onto the skin every 3 (three) days.     folic acid (FOLVITE) 1 MG tablet Take 1 mg by mouth daily.     furosemide (LASIX) 20 MG tablet TAKE 1 TABLET BY MOUTH THREE TIMES A DAY AS NEEDED BETWEEN MEALS AND AT BEDTIME 270 tablet 0   HYDROcodone-acetaminophen (NORCO) 10-325 MG tablet Take 1 tablet by mouth 4 (four) times daily.      Hyoscyamine Sulfate SL 0.125 MG SUBL Place 1 tablet under the tongue as needed for constipation.     losartan (COZAAR) 50 MG tablet Take 1 tablet (50 mg total) by mouth daily. 90 tablet 3   metFORMIN (GLUCOPHAGE) 1000 MG tablet Take 1,000 mg by mouth 2 (two) times daily with a meal.     nitroGLYCERIN (NITROSTAT) 0.4 MG SL tablet Place 1 tablet (0.4 mg total) under the tongue every 5 (five) minutes x 3 doses as needed for chest pain. 25 tablet 2   oxymetazoline (AFRIN) 0.05 % nasal spray Place 1 spray into both nostrils 2 (two) times daily as needed for congestion.     pioglitazone (ACTOS) 30 MG tablet Take 30 mg by mouth every morning.     ranitidine (ZANTAC) 150 MG capsule Take 150 mg by mouth daily as needed for heartburn.     Testosterone 20.25 MG/ACT (1.62%) GEL daily.     TRULICITY 1.5 0000000 SOPN Inject 0.5 mLs into the skin every Saturday.      valACYclovir (VALTREX) 1000 MG tablet Take 1,000 mg by mouth daily as needed (outbreaks).      No current facility-administered medications for this visit.    No Known Allergies  Review of Systems negative except from HPI and PMH  Physical Exam: BP (!) 166/88   Pulse 82   Ht '6\' 1"'$  (1.854 m)    Wt 246 lb (111.6 kg)   SpO2 95%   BMI 32.46 kg/m  Well developed and nourished in no acute distress HENT normal Neck supple with JVP-  flat   Lungs Clear Regular rate and rhythm, no murmurs or gallops Abd-soft with active BS No Clubbing cyanosis edema  Skin-warm and dry A & Oriented  Grossly normal sensory and motor function  ECG   sinus at 74 years interval 17/13/42 Right bundle branch block left axis deviation  Unchanged in comparison with June 2021  Assessment and  Plan:  Diabetes with autonomic neuropathy   Orthostatic hypotension   Abnormal ECG  Right bundle branch block is new since 10/19.  Was evident 3/20  CAD-s/p AMI LAD stenting 3/20   Chronic pain-narcotic dependent  Hyperlipidemia  Preoperative Assessment    Patient without symptoms of cardiorespiratory limitations; mostly limited by back.  No angina, should be at not increased risk for his back surgery.  No stable coronary artery disease.  Continue him on his atorvastatin 80.  Cardiomyopathy has normalized; continue carvedilol 6.25 Cozaar 50 and aspirin 81.  We will have him stop his Plavix.  Reviewing Dr. Antionette Char notes," would be acceptable risk to interrupt antiplatelet therapy for surgery "

## 2021-03-28 NOTE — Addendum Note (Signed)
Addended by: Rose Phi on: 03/28/2021 11:35 AM   Modules accepted: Orders

## 2021-04-19 ENCOUNTER — Other Ambulatory Visit: Payer: Self-pay

## 2021-04-19 ENCOUNTER — Encounter (HOSPITAL_BASED_OUTPATIENT_CLINIC_OR_DEPARTMENT_OTHER): Payer: Self-pay | Admitting: Physical Therapy

## 2021-04-19 ENCOUNTER — Ambulatory Visit (HOSPITAL_BASED_OUTPATIENT_CLINIC_OR_DEPARTMENT_OTHER): Payer: Medicare Other | Attending: Orthopedic Surgery | Admitting: Physical Therapy

## 2021-04-19 DIAGNOSIS — R2689 Other abnormalities of gait and mobility: Secondary | ICD-10-CM | POA: Diagnosis present

## 2021-04-19 DIAGNOSIS — M6281 Muscle weakness (generalized): Secondary | ICD-10-CM

## 2021-04-19 DIAGNOSIS — M545 Low back pain, unspecified: Secondary | ICD-10-CM | POA: Diagnosis not present

## 2021-04-19 DIAGNOSIS — G8929 Other chronic pain: Secondary | ICD-10-CM | POA: Insufficient documentation

## 2021-04-19 DIAGNOSIS — M542 Cervicalgia: Secondary | ICD-10-CM

## 2021-04-20 ENCOUNTER — Encounter (HOSPITAL_BASED_OUTPATIENT_CLINIC_OR_DEPARTMENT_OTHER): Payer: Self-pay | Admitting: Physical Therapy

## 2021-04-20 NOTE — Therapy (Signed)
[facility] [address] [place], [place], [address] Phone: [phone]   Fax:  [phone]  Physical Therapy Treatment  Patient Details  Name: Devin Combs MRN: [id number] Date of Birth: 10-12-1950 Referring Provider (PT): Melina Schools, MD   Encounter Date: 04/19/2021   PT End of Session - 04/20/21 1054     Visit Number 8    Number of Visits 19    Date for PT Re-Evaluation 05/02/21    Authorization Type Medicare A&B and Aetna Supplemental    PT Start Time [id number]   Patient 7 min late   PT Stop Time [id number]    PT Time Calculation (min) 43 min    Activity Tolerance Patient tolerated treatment well    Behavior During Therapy [facility] for tasks assessed/performed             Past Medical History:  Diagnosis Date   Acute systolic heart failure ([place])    Anxiety    Arthritis    Cancer ([place])    basal and squamous cell carcinoma   Chronic back pain    DDD (degenerative disc disease), thoracolumbar    Degenerative cervical spinal stenosis    Depression    Diabetic autonomic neuropathy ([place])    Dyspnea    with exertion    GERD (gastroesophageal reflux disease)    Headache    History of bronchitis    History of kidney stones    Hyperlipidemia    Hypertension    Hypogonadism in male    Myocardial infarction ([place])    Neuropathy    NSTEMI (non-ST elevated myocardial infarction) ([place])    10/29/2018- PCI/DESx1 to pLAD, EF 35%   Orthostatic hypotension    OSA (obstructive sleep apnea)    per pt study done 2006 (approx)  used cpap few yrs then stopped using, stated didn't feel he needed it anymore   Psoriasis    Retinal tear    Right knee meniscal tear    Type 2 diabetes mellitus ([place])    Urinary hesitancy    Vitamin D deficiency     Past Surgical History:  Procedure Laterality Date   CATARACT EXTRACTION W/ INTRAOCULAR LENS  IMPLANT, BILATERAL  right 2015//  left 2012   CORONARY/GRAFT ACUTE MI REVASCULARIZATION N/A  10/30/2018   Procedure: Coronary/Graft Acute MI Revascularization;  Surgeon: Troy Sine, MD;  Location: [facility];  Service: Cardiovascular;  Laterality: N/A;   CORRECTION REPAIR MULTIPLE  TOES , LEFT FOOT  YRS AGO   EXTRACORPOREAL SHOCK WAVE LITHOTRIPSY  2000 approx   KNEE ARTHROSCOPY Left 2007   KNEE ARTHROSCOPY Right 02/10/2015   Procedure: ARTHROSCOPY RIGHT KNEE WITH DEBRIDEMENT AND CHRONDROPLASTY;  Surgeon: Gaynelle Arabian, MD;  Location: [facility];  Service: Orthopedics;  Laterality: Right;   LEFT HEART CATH AND CORONARY ANGIOGRAPHY N/A 10/30/2018   Procedure: LEFT HEART CATH AND CORONARY ANGIOGRAPHY;  Surgeon: Troy Sine, MD;  Location: [facility];  Service: Cardiovascular;  Laterality: N/A;   PARTIAL KNEE ARTHROPLASTY Right 07/19/2016   Procedure: RIGHT KNEE MEDICAL UNICOMPARTMENTAL ARTHROPLASTY;  Surgeon: Gaynelle Arabian, MD;  [facility]: [facility];  Service: Orthopedics;  Laterality: Right;   RADICAL ORCHIECTOMY Left 1996   benign   REMOVAL SPURS RIGHT GREAT TOE  YRS AGO   TOTAL KNEE ARTHROPLASTY Left 09-16-2007    There were no vitals filed for this visit.   Subjective Assessment - 04/19/21 2045     Subjective Patient has not been to  therapy for a few weeks 2nd to therapistbeing out and having a biopsy. He reports his back is no worse but no better. He will have his    Pertinent History trouble sleeping, heart attack 2019, diabetes type 2, L4-5: Bilateral facet arthropathy with 7 mm anterolisthesis. Disc  degeneration with annular fissures and circumferential protrusion of  the disc. Stenosis of both lateral recesses and neural foramina that  could cause neural compression on either or both sides.    L5-S1: Disc degeneration with shallow disc protrusion but no  compressive effect upon the thecal sac or S1 nerves.    Limitations Sitting;Walking;Lifting;Standing    How long can you stand comfortably? limited 5-10,    How long can you walk comfortably?  limited    Diagnostic tests fusion l4-l5 plan for patient.    Patient Stated Goals return to tennis, golf    Currently in Pain? Yes    Pain Score 5     Pain Location Back    Pain Descriptors / Indicators Aching    Pain Type Chronic pain    Pain Onset More than a month ago    Pain Frequency Constant    Aggravating Factors  bending, walking, and standing    Pain Relieving Factors lying down on her side    Effect of Pain on Daily Activities difficulty walking                      Pt seen for aquatic therapy today.  Treatment took place in water 3.25-4 ft in depth at the [facility] pool. Temp of water was 91.  Pt entered/exited the pool via stairs (step through pattern) independently with bilat rail.   Warm up: heel/toe walking x4 laps across pool  side stepping x4 laps from    Ambulation with noodle support: long strides, march 2 laps    Exercises; Slow march x20; hip extension x20; hip abduction x20.   Seated LAQ and March x20.  Seated board flexion x20. Seated board rotation  Seated board press down x20.    Noodle press x20 yellow  Step ups x20 each leg     With neck noddle and foot float; push pull x20; side to side perturbations from the feet x20; press and push x20   Pt requires buoyancy for support and to offload joints with strengthening exercises. Viscosity of the water is needed for resistance of strengthening; water current perturbations provides challenge to standing balance unsupported, requiring increased core activation.;                 Upper Extremity Functional Index Score :   /80   PT Education - 04/20/21 1053     Education Details reviewed pool exercise technique    Person(s) Educated Patient    Methods Explanation;Demonstration;Tactile cues;Verbal cues    Comprehension Verbalized understanding;Returned demonstration;Verbal cues required;Tactile cues required              PT Short Term Goals - 03/21/21 1401        PT SHORT TERM GOAL #1   Title Patient will be indpendnet with basic pool program    Time 3    Period Weeks    Status On-going    Target Date 04/11/21      PT SHORT TERM GOAL #2   Title Patient will demonstrate 4+/5 gross bilateral LE strength    Time 3    Period Weeks    Status Achieved    Target  Date 03/21/21      PT SHORT TERM GOAL #3   Title Patient will show improved cervical range of motion at 55 for left and right rotation.    Baseline 50/45    Time 3    Period Weeks    Status New    Target Date 04/11/21               PT Long Term Goals - 02/28/21 1506       PT LONG TERM GOAL #1   Title Patient will improve gait mechanics in order to amblate 700 feet with decreased fatigue in order to ambulate in the community.    Baseline limited walking    Time 4    Period Weeks    Status New    Target Date 03/28/21      PT LONG TERM GOAL #2   Title Patient will increase lower extremity strength in bilateral hips and knee in order to improve transfers from sitting to standing, and standing to sitting.    Baseline 3+/5 throughout bilateral hip (except adduction 4/5), 4-/5 throughout bilateral knee    Time 4    Period Weeks    Status New    Target Date 03/28/21      PT LONG TERM GOAL #3   Title Patient will improve lumbar and cervical range of motion by 10% in order to perform ADL's safely.    Baseline limited cervical rotation 35 Lt rotation; Rt rotation 40, lumbar rotation 85%, side bending 70%.    Time 4    Period Weeks    Status New    Target Date 03/28/21      PT LONG TERM GOAL #4   Title Patient will be independent with final HEP and demonstrate understanding of correct form with exercises and stretches.    Baseline Inital HEP    Time 4    Period Weeks    Status New                   Plan - 04/20/21 1054     Clinical Impression Statement Patient tolerated ther-ex well considering he has not been able to attend for a few weeks. Therapy  focused on hip and core strengthening. He will see the MD on Friday. We will review his game plan at that time. He reported he could really feel the UE exercises with the noodle and the foam pool weights.    Personal Factors and Comorbidities Comorbidity 1;Comorbidity 2    Comorbidities myocardial infarction HCC, diabetes type 2, dysautonomia, orthostatic hypotension (patient states it has not occured in over a year)    Examination-Activity Limitations Bathing;Bend;Carry;Stairs;Stand;Transfers    Examination-Participation Restrictions Cleaning;Community Activity;Yard Work;Other    Stability/Clinical Decision Making Evolving/Moderate complexity    Clinical Decision Making Moderate    Rehab Potential Good    PT Frequency 2x / week    PT Duration 6 weeks    PT Treatment/Interventions ADLs/Self Care Home Management;Aquatic Therapy;Contrast Bath;Gait training;Stair training;Electrical Stimulation;Cryotherapy;Functional mobility training;Therapeutic activities;Therapeutic exercise;Balance training;Traction;Moist Heat;Manual techniques;Taping;Energy conservation;Dry needling;Neuromuscular re-education;Passive range of motion;Patient/family education;Joint Manipulations    PT Next Visit Plan Aquatic therapy intro, improve gait mechanics and improve balance by increasing core strenght and LE strength, progress HEP to improve lower extremity strength in hips and knees, manuel therapy to reduce upper trap tightness; lower back tightness.    PT Home Exercise Plan 3x10 banded seated hip abdcution, 3x10 seated banded hip flexion, 3x10 seated banded knee extension; all exercises done  in yellow band    Consulted and Agree with Plan of Care Patient;Family member/caregiver    Family Member Consulted wife             Patient will benefit from skilled therapeutic intervention in order to improve the following deficits and impairments:  Abnormal gait, Decreased activity tolerance, Decreased balance, Decreased  coordination, Decreased range of motion, Decreased mobility, Decreased endurance, Decreased strength, Difficulty walking, Hypomobility, Impaired sensation, Pain, Impaired UE functional use, Postural dysfunction  Visit Diagnosis: Chronic bilateral low back pain without sciatica  Other abnormalities of gait and mobility  Muscle weakness (generalized)  Cervicalgia     Problem List Patient Active Problem List   Diagnosis Date Noted   Orthostatic hypotension 01/30/2020   Hypertension 11/01/2018   Hyperlipidemia 99991111   Acute systolic heart failure ([place]) 11/01/2018   NSTEMI (non-ST elevated myocardial infarction) ([place]) 10/29/2018   Complex sleep apnea syndrome 07/10/2017   Treatment-emergent central sleep apnea 07/10/2017   Myocardial infarction ([place]) 07/10/2017   Excessive daytime sleepiness 05/31/2017   Snoring 05/31/2017   Coronary artery disease involving native coronary artery of native heart without angina pectoris 05/31/2017   Intermittent palpitations 05/31/2017   Perspiration-excessive 05/31/2017   Dysautonomia orthostatic hypotension syndrome 05/31/2017   OA (osteoarthritis) of knee 07/19/2016   Acute medial meniscal tear 02/10/2015    Carney Living, PT DPT  04/20/2021, 12:09 PM  [facility] [address] [place], [place], [address] Phone: [phone]   Fax:  [phone]  Name: Devin Combs MRN: [id number] Date of Birth: 04-Oct-1950

## 2021-04-21 ENCOUNTER — Ambulatory Visit (HOSPITAL_BASED_OUTPATIENT_CLINIC_OR_DEPARTMENT_OTHER): Payer: Medicare Other | Admitting: Physical Therapy

## 2021-04-21 ENCOUNTER — Other Ambulatory Visit: Payer: Self-pay

## 2021-04-21 DIAGNOSIS — M6281 Muscle weakness (generalized): Secondary | ICD-10-CM

## 2021-04-21 DIAGNOSIS — M542 Cervicalgia: Secondary | ICD-10-CM

## 2021-04-21 DIAGNOSIS — G8929 Other chronic pain: Secondary | ICD-10-CM

## 2021-04-21 DIAGNOSIS — M545 Low back pain, unspecified: Secondary | ICD-10-CM | POA: Diagnosis not present

## 2021-04-21 DIAGNOSIS — R2689 Other abnormalities of gait and mobility: Secondary | ICD-10-CM

## 2021-04-22 NOTE — Therapy (Signed)
[facility] [address] [place], [place], [address] Phone: [phone]   Fax:  [phone]  Physical Therapy Treatment  Patient Details  Name: Devin Combs MRN: [id number] Date of Birth: 05/18/1951 Referring Provider (PT): Melina Schools, MD   Encounter Date: 04/21/2021   PT End of Session - 04/22/21 1419     Visit Number 9    Number of Visits 19    Date for PT Re-Evaluation 05/02/21    Authorization Type Medicare A&B and Aetna Supplemental progress note next visit    PT Start Time 1110   therapy did not relaize patient was here   PT Stop Time 1150    PT Time Calculation (min) 40 min    Activity Tolerance Patient tolerated treatment well    Behavior During Therapy [facility] for tasks assessed/performed             Past Medical History:  Diagnosis Date   Acute systolic heart failure ([place])    Anxiety    Arthritis    Cancer ([place])    basal and squamous cell carcinoma   Chronic back pain    DDD (degenerative disc disease), thoracolumbar    Degenerative cervical spinal stenosis    Depression    Diabetic autonomic neuropathy ([facility])    Dyspnea    with exertion    GERD (gastroesophageal reflux disease)    Headache    History of bronchitis    History of kidney stones    Hyperlipidemia    Hypertension    Hypogonadism in male    Myocardial infarction ([place])    Neuropathy    NSTEMI (non-ST elevated myocardial infarction) ([place])    10/29/2018- PCI/DESx1 to pLAD, EF 35%   Orthostatic hypotension    OSA (obstructive sleep apnea)    per pt study done 2006 (approx)  used cpap few yrs then stopped using, stated didn't feel he needed it anymore   Psoriasis    Retinal tear    Right knee meniscal tear    Type 2 diabetes mellitus ([place])    Urinary hesitancy    Vitamin D deficiency     Past Surgical History:  Procedure Laterality Date   CATARACT EXTRACTION W/ INTRAOCULAR LENS  IMPLANT, BILATERAL  right 2015//  left 2012    CORONARY/GRAFT ACUTE MI REVASCULARIZATION N/A 10/30/2018   Procedure: Coronary/Graft Acute MI Revascularization;  Surgeon: Troy Sine, MD;  Location: [facility];  Service: Cardiovascular;  Laterality: N/A;   CORRECTION REPAIR MULTIPLE  TOES , LEFT FOOT  YRS AGO   EXTRACORPOREAL SHOCK WAVE LITHOTRIPSY  2000 approx   KNEE ARTHROSCOPY Left 2007   KNEE ARTHROSCOPY Right 02/10/2015   Procedure: ARTHROSCOPY RIGHT KNEE WITH DEBRIDEMENT AND CHRONDROPLASTY;  Surgeon: Gaynelle Arabian, MD;  Location: [facility];  Service: Orthopedics;  Laterality: Right;   LEFT HEART CATH AND CORONARY ANGIOGRAPHY N/A 10/30/2018   Procedure: LEFT HEART CATH AND CORONARY ANGIOGRAPHY;  Surgeon: Troy Sine, MD;  Location: [facility];  Service: Cardiovascular;  Laterality: N/A;   PARTIAL KNEE ARTHROPLASTY Right 07/19/2016   Procedure: RIGHT KNEE MEDICAL UNICOMPARTMENTAL ARTHROPLASTY;  Surgeon: Gaynelle Arabian, MD;  Location: [facility];  Service: Orthopedics;  Laterality: Right;   RADICAL ORCHIECTOMY Left 1996   benign   REMOVAL SPURS RIGHT GREAT TOE  YRS AGO   TOTAL KNEE ARTHROPLASTY Left 09-16-2007    There were no vitals filed for this visit.   Subjective Assessment - 04/22/21 1411  Subjective Patient reports pain the night follwoing his last session. he feels that overall he is getting stronger. He will have his MD appointment on 04/22/2021 to come up with a game plan.    Pertinent History trouble sleeping, heart attack 2019, diabetes type 2, L4-5: Bilateral facet arthropathy with 7 mm anterolisthesis. Disc  degeneration with annular fissures and circumferential protrusion of  the disc. Stenosis of both lateral recesses and neural foramina that  could cause neural compression on either or both sides.    L5-S1: Disc degeneration with shallow disc protrusion but no  compressive effect upon the thecal sac or S1 nerves.    Limitations Sitting;Walking;Lifting;Standing    How long can you stand  comfortably? limited 5-10,    How long can you walk comfortably? limited    Diagnostic tests fusion l4-l5 plan for patient.    Patient Stated Goals return to tennis, golf    Currently in Pain? Yes    Pain Score 5     Pain Location Back    Pain Orientation Right    Pain Descriptors / Indicators Aching    Pain Type Chronic pain    Pain Onset More than a month ago    Pain Frequency Constant    Aggravating Factors  bending and walking    Pain Relieving Factors lying down on her side    Effect of Pain on Daily Activities difficulty walking and perfroming daily activity    Multiple Pain Sites No               Pt seen for aquatic therapy today.  Treatment took place in water 3.25-4 ft in depth at the [facility] pool. Temp of water was 91.  Pt entered/exited the pool via stairs (step through pattern) independently with bilat rail.   Warm up: heel/toe walking x4 laps across pool  side stepping x4 laps from    Ambulation with noodle support: long strides, march 2 laps    Exercises; Slow march x20; hip extension x20; hip abduction x20.   Seated LAQ and March x20.   Seated board flexion x20. Seated board rotation   Seated board press down x20.    Noodle press x20 yellow   Step ups x20 each leg      With neck noddle and foot float; push pull x20; side to side perturbations from the feet x20; press and push x20   Pt requires buoyancy for support and to offload joints with strengthening exercises. Viscosity of the water is needed for resistance of strengthening; water current perturbations provides challenge to standing balance unsupported, requiring increased core activation.;                         PT Education - 04/22/21 1415     Education Details reviewed tehcnique with pool exercises.    Person(s) Educated Patient    Methods Explanation;Demonstration;Tactile cues;Verbal cues    Comprehension Verbalized understanding;Returned  demonstration;Verbal cues required;Tactile cues required              PT Short Term Goals - 03/21/21 1401       PT SHORT TERM GOAL #1   Title Patient will be indpendnet with basic pool program    Time 3    Period Weeks    Status On-going    Target Date 04/11/21      PT SHORT TERM GOAL #2   Title Patient will demonstrate 4+/5 gross bilateral LE strength  Time 3    Period Weeks    Status Achieved    Target Date 03/21/21      PT SHORT TERM GOAL #3   Title Patient will show improved cervical range of motion at 55 for left and right rotation.    Baseline 50/45    Time 3    Period Weeks    Status New    Target Date 04/11/21               PT Long Term Goals - 02/28/21 1506       PT LONG TERM GOAL #1   Title Patient will improve gait mechanics in order to amblate 700 feet with decreased fatigue in order to ambulate in the community.    Baseline limited walking    Time 4    Period Weeks    Status New    Target Date 03/28/21      PT LONG TERM GOAL #2   Title Patient will increase lower extremity strength in bilateral hips and knee in order to improve transfers from sitting to standing, and standing to sitting.    Baseline 3+/5 throughout bilateral hip (except adduction 4/5), 4-/5 throughout bilateral knee    Time 4    Period Weeks    Status New    Target Date 03/28/21      PT LONG TERM GOAL #3   Title Patient will improve lumbar and cervical range of motion by 10% in order to perform ADL's safely.    Baseline limited cervical rotation 35 Lt rotation; Rt rotation 40, lumbar rotation 85%, side bending 70%.    Time 4    Period Weeks    Status New    Target Date 03/28/21      PT LONG TERM GOAL #4   Title Patient will be independent with final HEP and demonstrate understanding of correct form with exercises and stretches.    Baseline Inital HEP    Time 4    Period Weeks    Status New                   Plan - 04/22/21 1419     Clinical  Impression Statement Patient continues to tolerate ther-ex well. pool program kept consitent. Therapy will continue to progress difficulty of pool exercises as tolerated.    Personal Factors and Comorbidities Comorbidity 1;Comorbidity 2    Comorbidities myocardial infarction HCC, diabetes type 2, dysautonomia, orthostatic hypotension (patient states it has not occured in over a year)    Examination-Activity Limitations Bathing;Bend;Carry;Stairs;Stand;Transfers    Examination-Participation Restrictions Cleaning;Community Activity;Yard Work;Other    PT Frequency 2x / week    PT Duration 6 weeks    PT Treatment/Interventions ADLs/Self Care Home Management;Aquatic Therapy;Contrast Bath;Gait training;Stair training;Electrical Stimulation;Cryotherapy;Functional mobility training;Therapeutic activities;Therapeutic exercise;Balance training;Traction;Moist Heat;Manual techniques;Taping;Energy conservation;Dry needling;Neuromuscular re-education;Passive range of motion;Patient/family education;Joint Manipulations    PT Next Visit Plan Aquatic therapy intro, improve gait mechanics and improve balance by increasing core strenght and LE strength, progress HEP to improve lower extremity strength in hips and knees, manuel therapy to reduce upper trap tightness; lower back tightness.    PT Home Exercise Plan 3x10 banded seated hip abdcution, 3x10 seated banded hip flexion, 3x10 seated banded knee extension; all exercises done in yellow band    Consulted and Agree with Plan of Care Patient;Family member/caregiver    Family Member Consulted wife             Patient will benefit from skilled therapeutic  intervention in order to improve the following deficits and impairments:  Abnormal gait, Decreased activity tolerance, Decreased balance, Decreased coordination, Decreased range of motion, Decreased mobility, Decreased endurance, Decreased strength, Difficulty walking, Hypomobility, Impaired sensation, Pain, Impaired  UE functional use, Postural dysfunction  Visit Diagnosis: Chronic bilateral low back pain without sciatica  Other abnormalities of gait and mobility  Muscle weakness (generalized)  Cervicalgia     Problem List Patient Active Problem List   Diagnosis Date Noted   Orthostatic hypotension 01/30/2020   Hypertension 11/01/2018   Hyperlipidemia 99991111   Acute systolic heart failure ([place]) 11/01/2018   NSTEMI (non-ST elevated myocardial infarction) ([place]) 10/29/2018   Complex sleep apnea syndrome 07/10/2017   Treatment-emergent central sleep apnea 07/10/2017   Myocardial infarction ([place]) 07/10/2017   Excessive daytime sleepiness 05/31/2017   Snoring 05/31/2017   Coronary artery disease involving native coronary artery of native heart without angina pectoris 05/31/2017   Intermittent palpitations 05/31/2017   Perspiration-excessive 05/31/2017   Dysautonomia orthostatic hypotension syndrome 05/31/2017   OA (osteoarthritis) of knee 07/19/2016   Acute medial meniscal tear 02/10/2015    Carney Living, PT DPT  04/22/2021, 3:05 PM  [facility] [address] [place], [place], [address] Phone: [phone]   Fax:  [phone]  Name: Devin Combs MRN: [id number] Date of Birth: Jan 13, 1951

## 2021-04-26 ENCOUNTER — Ambulatory Visit (HOSPITAL_BASED_OUTPATIENT_CLINIC_OR_DEPARTMENT_OTHER): Payer: Medicare Other | Admitting: Physical Therapy

## 2021-04-26 ENCOUNTER — Other Ambulatory Visit: Payer: Self-pay

## 2021-04-26 DIAGNOSIS — M6281 Muscle weakness (generalized): Secondary | ICD-10-CM

## 2021-04-26 DIAGNOSIS — R2689 Other abnormalities of gait and mobility: Secondary | ICD-10-CM

## 2021-04-26 DIAGNOSIS — G8929 Other chronic pain: Secondary | ICD-10-CM

## 2021-04-26 DIAGNOSIS — M545 Low back pain, unspecified: Secondary | ICD-10-CM | POA: Diagnosis not present

## 2021-04-26 DIAGNOSIS — M542 Cervicalgia: Secondary | ICD-10-CM

## 2021-04-27 ENCOUNTER — Encounter (HOSPITAL_BASED_OUTPATIENT_CLINIC_OR_DEPARTMENT_OTHER): Payer: Self-pay | Admitting: Physical Therapy

## 2021-04-27 NOTE — Therapy (Signed)
[facility] [address] [place], [place], [address] Phone: [phone]   Fax:  [phone]  Physical Therapy Treatment/Progress  Progress Note Reporting Period 02/25/2021 to 04/26/2021  See note below for Objective Data and Assessment of Progress/Goals.      Patient Details  Name: Devin Combs MRN: [id number] Date of Birth: 07-07-1951 Referring Provider (PT): Melina Schools, MD   Encounter Date: 04/26/2021   PT End of Session - 04/27/21 0951     Visit Number 10    Number of Visits 19    Date for PT Re-Evaluation 05/02/21    Authorization Type Medicare A&B and [employer] Supplemental progress note next visit    PT Start Time 76    PT Stop Time 1143    PT Time Calculation (min) 43 min    Activity Tolerance Patient tolerated treatment well    Behavior During Therapy WFL for tasks assessed/performed             Past Medical History:  Diagnosis Date   Acute systolic heart failure ([place])    Anxiety    Arthritis    Cancer ([place])    basal and squamous cell carcinoma   Chronic back pain    DDD (degenerative disc disease), thoracolumbar    Degenerative cervical spinal stenosis    Depression    Diabetic autonomic neuropathy ([facility])    Dyspnea    with exertion    GERD (gastroesophageal reflux disease)    Headache    History of bronchitis    History of kidney stones    Hyperlipidemia    Hypertension    Hypogonadism in male    Myocardial infarction [facility])    Neuropathy    NSTEMI (non-ST elevated myocardial infarction) ([place])    10/29/2018- PCI/DESx1 to pLAD, EF 35%   Orthostatic hypotension    OSA (obstructive sleep apnea)    per pt study done 2006 (approx)  used cpap few yrs then stopped using, stated didn't feel he needed it anymore   Psoriasis    Retinal tear    Right knee meniscal tear    Type 2 diabetes mellitus ([place])    Urinary hesitancy    Vitamin D deficiency     Past Surgical History:  Procedure  Laterality Date   CATARACT EXTRACTION W/ INTRAOCULAR LENS  IMPLANT, BILATERAL  right 2015//  left 2012   CORONARY/GRAFT ACUTE MI REVASCULARIZATION N/A 10/30/2018   Procedure: Coronary/Graft Acute MI Revascularization;  Surgeon: Troy Sine, MD;  Location: [facility];  Service: Cardiovascular;  Laterality: N/A;   CORRECTION REPAIR MULTIPLE  TOES , LEFT FOOT  YRS AGO   EXTRACORPOREAL SHOCK WAVE LITHOTRIPSY  2000 approx   KNEE ARTHROSCOPY Left 2007   KNEE ARTHROSCOPY Right 02/10/2015   Procedure: ARTHROSCOPY RIGHT KNEE WITH DEBRIDEMENT AND CHRONDROPLASTY;  Surgeon: Gaynelle Arabian, MD;  Location: [facility];  Service: Orthopedics;  Laterality: Right;   LEFT HEART CATH AND CORONARY ANGIOGRAPHY N/A 10/30/2018   Procedure: LEFT HEART CATH AND CORONARY ANGIOGRAPHY;  Surgeon: Troy Sine, MD;  Location: [facility];  Service: Cardiovascular;  Laterality: N/A;   PARTIAL KNEE ARTHROPLASTY Right 07/19/2016   Procedure: RIGHT KNEE MEDICAL UNICOMPARTMENTAL ARTHROPLASTY;  Surgeon: Gaynelle Arabian, MD;  [facility]: [facility];  Service: Orthopedics;  Laterality: Right;   RADICAL ORCHIECTOMY Left 1996   benign   REMOVAL SPURS RIGHT GREAT TOE  YRS AGO   TOTAL KNEE ARTHROPLASTY Left 09-16-2007    There were no  vitals filed for this visit.   Subjective Assessment - 04/27/21 0807     Subjective Patient reports he is more sore today. he has not been sleeping well. He has been to the surgeon. He will likley have surgery in October. His surgeon advised him to continue with therapy until his surgery.    Pertinent History trouble sleeping, heart attack 2019, diabetes type 2, L4-5: Bilateral facet arthropathy with 7 mm anterolisthesis. Disc  degeneration with annular fissures and circumferential protrusion of  the disc. Stenosis of both lateral recesses and neural foramina that  could cause neural compression on either or both sides.    L5-S1: Disc degeneration with shallow disc protrusion but  no  compressive effect upon the thecal sac or S1 nerves.    Limitations Sitting;Walking;Lifting;Standing    How long can you stand comfortably? limited 5-10,    How long can you walk comfortably? limited    Diagnostic tests fusion l4-l5 plan for patient.    Patient Stated Goals return to tennis, golf    Currently in Pain? Yes    Pain Score 6     Pain Location Back    Pain Orientation Right    Pain Descriptors / Indicators Aching    Pain Type Chronic pain    Pain Onset More than a month ago    Pain Frequency Constant    Aggravating Factors  bending and walking    Pain Relieving Factors lying down on his side    Multiple Pain Sites No                [facility] PT Assessment - 04/27/21 0001       Assessment   Medical Diagnosis Low back pain: denerative    Referring Provider (PT) Melina Schools, MD      AROM   Right Hip Flexion 110    Cervical - Right Rotation 50    Cervical - Left Rotation 50    Lumbar - Right Side Bend 50%    Lumbar - Left Rotation 50% '      Strength   Right Hip Flexion 4/5    Right Hip ABduction 4/5    Right Hip ADduction 5/5    Left Hip Flexion 4/5    Left Hip ABduction 4/5    Left Hip ADduction 5/5    Right Knee Flexion 4/5    Right Knee Extension 4+/5    Left Knee Flexion 4+/5    Left Knee Extension 4+/5                Pt seen for aquatic therapy today.  Treatment took place in water 3.25-4 ft in depth at the [facility] pool. Temp of water was 91.  Pt entered/exited the pool via stairs (step through pattern) independently with bilat rail.   Warm up: heel/toe walking x4 laps across pool  side stepping x4 laps from    Ambulation with noodle support: long strides, march 2 laps    Exercises; Slow march x20; hip extension x20; hip abduction x20.     Seated LAQ and March 2x20. 2 lb weaight    Seated board flexion x20. Seated board rotation   Seated board press down x20.     Seated Noodle press x20 yellow  Seated weight  winging x20     Step ups x20 each leg      With neck noddle and foot float; push pull x20; side to side perturbations from the feet x20; press and push  x20   Pt requires buoyancy for support and to offload joints with strengthening exercises. Viscosity of the water is needed for resistance of strengthening; water current perturbations provides challenge to standing balance unsupported, requiring increased core activation.;                                                      PT Education - 04/27/21 0808     Education Details reviewed tehcnique with pool exerfcises    Person(s) Educated Patient    Methods Explanation;Tactile cues;Verbal cues;Demonstration    Comprehension Verbalized understanding;Returned demonstration;Verbal cues required;Tactile cues required              PT Short Term Goals - 03/21/21 1401       PT SHORT TERM GOAL #1   Title Patient will be indpendnet with basic pool program    Time 3    Period Weeks    Status On-going    Target Date 04/11/21      PT SHORT TERM GOAL #2   Title Patient will demonstrate 4+/5 gross bilateral LE strength    Time 3    Period Weeks    Status Achieved    Target Date 03/21/21      PT SHORT TERM GOAL #3   Title Patient will show improved cervical range of motion at 55 for left and right rotation.    Baseline 50/45    Time 3    Period Weeks    Status New    Target Date 04/11/21               PT Long Term Goals - 02/28/21 1506       PT LONG TERM GOAL #1   Title Patient will improve gait mechanics in order to amblate 700 feet with decreased fatigue in order to ambulate in the community.    Baseline limited walking    Time 4    Period Weeks    Status New    Target Date 03/28/21      PT LONG TERM GOAL #2   Title Patient will increase lower extremity strength in bilateral hips and knee in order to improve transfers from sitting to standing, and standing to sitting.    Baseline  3+/5 throughout bilateral hip (except adduction 4/5), 4-/5 throughout bilateral knee    Time 4    Period Weeks    Status New    Target Date 03/28/21      PT LONG TERM GOAL #3   Title Patient will improve lumbar and cervical range of motion by 10% in order to perform ADL's safely.    Baseline limited cervical rotation 35 Lt rotation; Rt rotation 40, lumbar rotation 85%, side bending 70%.    Time 4    Period Weeks    Status New    Target Date 03/28/21      PT LONG TERM GOAL #4   Title Patient will be independent with final HEP and demonstrate understanding of correct form with exercises and stretches.    Baseline Inital HEP    Time 4    Period Weeks    Status New                   Plan - 04/27/21 0954     Clinical Impression Statement Objective measures perfromed  on land today. He continues to have limitations in lumbar motion and cervical motion. He has had minor improvements with cervical motion. Functioanlly he feels lke he is standing a little better. he continues to have bad days though. He tolerated treatment weell today. Therapy focused more on sitting core stability 2nd to baseline fatigue. He would benefit from further skilled therapy 2W6 to continue to maximize trengthening prior to surgery.    Personal Factors and Comorbidities Comorbidity 1;Comorbidity 2    Comorbidities myocardial infarction HCC, diabetes type 2, dysautonomia, orthostatic hypotension (patient states it has not occured in over a year)    Examination-Activity Limitations Bathing;Bend;Carry;Stairs;Stand;Transfers    Examination-Participation Restrictions Cleaning;Community Activity;Yard Work;Other    Stability/Clinical Decision Making Evolving/Moderate complexity    Clinical Decision Making Moderate    Rehab Potential Good    PT Frequency 2x / week    PT Duration 6 weeks    PT Treatment/Interventions ADLs/Self Care Home Management;Aquatic Therapy;Contrast Bath;Gait training;Stair  training;Electrical Stimulation;Cryotherapy;Functional mobility training;Therapeutic activities;Therapeutic exercise;Balance training;Traction;Moist Heat;Manual techniques;Taping;Energy conservation;Dry needling;Neuromuscular re-education;Passive range of motion;Patient/family education;Joint Manipulations    PT Next Visit Plan Aquatic therapy intro, improve gait mechanics and improve balance by increasing core strenght and LE strength, progress HEP to improve lower extremity strength in hips and knees, manuel therapy to reduce upper trap tightness; lower back tightness.    PT Home Exercise Plan 3x10 banded seated hip abdcution, 3x10 seated banded hip flexion, 3x10 seated banded knee extension; all exercises done in yellow band    Consulted and Agree with Plan of Care Patient;Family member/caregiver    Family Member Consulted wife             Patient will benefit from skilled therapeutic intervention in order to improve the following deficits and impairments:  Abnormal gait, Decreased activity tolerance, Decreased balance, Decreased coordination, Decreased range of motion, Decreased mobility, Decreased endurance, Decreased strength, Difficulty walking, Hypomobility, Impaired sensation, Pain, Impaired UE functional use, Postural dysfunction  Visit Diagnosis: Chronic bilateral low back pain without sciatica  Other abnormalities of gait and mobility  Muscle weakness (generalized)  Cervicalgia     Problem List Patient Active Problem List   Diagnosis Date Noted   Orthostatic hypotension 01/30/2020   Hypertension 11/01/2018   Hyperlipidemia 99991111   Acute systolic heart failure ([place]) 11/01/2018   NSTEMI (non-ST elevated myocardial infarction) ([place]) 10/29/2018   Complex sleep apnea syndrome 07/10/2017   Treatment-emergent central sleep apnea 07/10/2017   Myocardial infarction ([place]) 07/10/2017   Excessive daytime sleepiness 05/31/2017   Snoring 05/31/2017   Coronary artery disease  involving native coronary artery of native heart without angina pectoris 05/31/2017   Intermittent palpitations 05/31/2017   Perspiration-excessive 05/31/2017   Dysautonomia orthostatic hypotension syndrome 05/31/2017   OA (osteoarthritis) of knee 07/19/2016   Acute medial meniscal tear 02/10/2015    Carney Living, PT 04/27/2021, 10:03 AM  [facility] [address] [place], [place], [address] Phone: [phone]   Fax:  [phone]  Name: Devin Combs MRN: [id number] Date of Birth: Feb 26, 1951

## 2021-04-29 ENCOUNTER — Ambulatory Visit (HOSPITAL_BASED_OUTPATIENT_CLINIC_OR_DEPARTMENT_OTHER): Payer: Medicare Other | Admitting: Physical Therapy

## 2021-04-29 ENCOUNTER — Other Ambulatory Visit: Payer: Self-pay

## 2021-04-29 ENCOUNTER — Encounter (HOSPITAL_BASED_OUTPATIENT_CLINIC_OR_DEPARTMENT_OTHER): Payer: Self-pay | Admitting: Physical Therapy

## 2021-04-29 DIAGNOSIS — M6281 Muscle weakness (generalized): Secondary | ICD-10-CM

## 2021-04-29 DIAGNOSIS — G8929 Other chronic pain: Secondary | ICD-10-CM

## 2021-04-29 DIAGNOSIS — M542 Cervicalgia: Secondary | ICD-10-CM

## 2021-04-29 DIAGNOSIS — M545 Low back pain, unspecified: Secondary | ICD-10-CM | POA: Diagnosis not present

## 2021-04-29 DIAGNOSIS — R2689 Other abnormalities of gait and mobility: Secondary | ICD-10-CM

## 2021-04-29 NOTE — Therapy (Signed)
[facility] [address] [place], [place], [address] Phone: [phone]   Fax:  [phone]  Physical Therapy Treatment  Patient Details  Name: Devin Combs MRN: [id number] Date of Birth: 11-Apr-1951 Referring Provider (PT): Melina Schools, MD   Encounter Date: 04/29/2021   PT End of Session - 04/29/21 1817     Visit Number 11    Number of Visits 22    Date for PT Re-Evaluation 06/08/21    Authorization Type Medicare A&B and Aetna Supplemental progress note next visit    PT Start Time 82    PT Stop Time 1142    PT Time Calculation (min) 42 min    Activity Tolerance Patient tolerated treatment well    Behavior During Therapy [facility] for tasks assessed/performed             Past Medical History:  Diagnosis Date   Acute systolic heart failure ([place])    Anxiety    Arthritis    Cancer ([place])    basal and squamous cell carcinoma   Chronic back pain    DDD (degenerative disc disease), thoracolumbar    Degenerative cervical spinal stenosis    Depression    Diabetic autonomic neuropathy ([facility])    Dyspnea    with exertion    GERD (gastroesophageal reflux disease)    Headache    History of bronchitis    History of kidney stones    Hyperlipidemia    Hypertension    Hypogonadism in male    Myocardial infarction ([place])    Neuropathy    NSTEMI (non-ST elevated myocardial infarction) ([place])    10/29/2018- PCI/DESx1 to pLAD, EF 35%   Orthostatic hypotension    OSA (obstructive sleep apnea)    per pt study done 2006 (approx)  used cpap few yrs then stopped using, stated didn't feel he needed it anymore   Psoriasis    Retinal tear    Right knee meniscal tear    Type 2 diabetes mellitus ([place])    Urinary hesitancy    Vitamin D deficiency     Past Surgical History:  Procedure Laterality Date   CATARACT EXTRACTION W/ INTRAOCULAR LENS  IMPLANT, BILATERAL  right 2015//  left 2012   CORONARY/GRAFT ACUTE MI REVASCULARIZATION  N/A 10/30/2018   Procedure: Coronary/Graft Acute MI Revascularization;  Surgeon: Troy Sine, MD;  Location: [facility];  Service: Cardiovascular;  Laterality: N/A;   CORRECTION REPAIR MULTIPLE  TOES , LEFT FOOT  YRS AGO   EXTRACORPOREAL SHOCK WAVE LITHOTRIPSY  2000 approx   KNEE ARTHROSCOPY Left 2007   KNEE ARTHROSCOPY Right 02/10/2015   Procedure: ARTHROSCOPY RIGHT KNEE WITH DEBRIDEMENT AND CHRONDROPLASTY;  Surgeon: Gaynelle Arabian, MD;  Location: [facility];  Service: Orthopedics;  Laterality: Right;   LEFT HEART CATH AND CORONARY ANGIOGRAPHY N/A 10/30/2018   Procedure: LEFT HEART CATH AND CORONARY ANGIOGRAPHY;  Surgeon: Troy Sine, MD;  Location: [facility];  Service: Cardiovascular;  Laterality: N/A;   PARTIAL KNEE ARTHROPLASTY Right 07/19/2016   Procedure: RIGHT KNEE MEDICAL UNICOMPARTMENTAL ARTHROPLASTY;  Surgeon: Gaynelle Arabian, MD;  [facility];  Service: Orthopedics;  Laterality: Right;   RADICAL ORCHIECTOMY Left 1996   benign   REMOVAL SPURS RIGHT GREAT TOE  YRS AGO   TOTAL KNEE ARTHROPLASTY Left 09-16-2007    There were no vitals filed for this visit.   Subjective Assessment - 04/29/21 1814     Subjective Patient is sore today. He had  an MD appointment yesterday and sat unsuported for 2 hours. He tolerated his last visit well.    Pertinent History trouble sleeping, heart attack 2019, diabetes type 2, L4-5: Bilateral facet arthropathy with 7 mm anterolisthesis. Disc  degeneration with annular fissures and circumferential protrusion of  the disc. Stenosis of both lateral recesses and neural foramina that  could cause neural compression on either or both sides.    L5-S1: Disc degeneration with shallow disc protrusion but no  compressive effect upon the thecal sac or S1 nerves.    Limitations Sitting;Walking;Lifting;Standing    How long can you stand comfortably? limited 5-10,    How long can you walk comfortably? limited    Diagnostic tests  fusion l4-l5 plan for patient.    Patient Stated Goals return to tennis, golf    Currently in Pain? Yes    Pain Score 7     Pain Location Back    Pain Orientation Right    Pain Descriptors / Indicators Aching    Pain Type Chronic pain    Pain Onset More than a month ago    Pain Frequency Constant    Aggravating Factors  bending and walking    Pain Relieving Factors lying on his side    Effect of Pain on Daily Activities difficulty perfroming daily activity    Multiple Pain Sites No               Pt seen for aquatic therapy today.  Treatment took place in water 3.25-4 ft in depth at the [facility] pool. Temp of water was 91.  Pt entered/exited the pool via stairs (step through pattern) independently with bilat rail.   Warm up: heel/toe walking x4 laps across pool  side stepping x4 laps from    Ambulation with noodle support: long strides, march 2 laps    Exercises; Slow march x20; hip extension x20; hip abduction x20.      Seated LAQ and March 2x20. 2 lb weight    Seated board flexion x20. Seated board rotation   Seated board press down x20.     Seated Noodle press x20 yellow   Seated weight winging x20    noodle row x20 blue    Step ups x20 each leg      With neck noddle and foot float; push pull x20; side to side perturbations from the feet x20; press and push x20   Pt requires buoyancy for support and to offload joints with strengthening exercises. Viscosity of the water is needed for resistance of strengthening; water current perturbations provides challenge to standing balance unsupported, requiring increased core activation.;                           PT Education - 04/29/21 1817     Education Details reviewed HEP and symptom mangement    Person(s) Educated Patient    Methods Explanation;Demonstration;Tactile cues;Verbal cues    Comprehension Verbalized understanding;Returned demonstration;Verbal cues required;Tactile cues  required              PT Short Term Goals - 03/21/21 1401       PT SHORT TERM GOAL #1   Title Patient will be indpendnet with basic pool program    Time 3    Period Weeks    Status On-going    Target Date 04/11/21      PT SHORT TERM GOAL #2   Title Patient will demonstrate 4+/5 gross  bilateral LE strength    Time 3    Period Weeks    Status Achieved    Target Date 03/21/21      PT SHORT TERM GOAL #3   Title Patient will show improved cervical range of motion at 55 for left and right rotation.    Baseline 50/45    Time 3    Period Weeks    Status New    Target Date 04/11/21               PT Long Term Goals - 02/28/21 1506       PT LONG TERM GOAL #1   Title Patient will improve gait mechanics in order to amblate 700 feet with decreased fatigue in order to ambulate in the community.    Baseline limited walking    Time 4    Period Weeks    Status New    Target Date 03/28/21      PT LONG TERM GOAL #2   Title Patient will increase lower extremity strength in bilateral hips and knee in order to improve transfers from sitting to standing, and standing to sitting.    Baseline 3+/5 throughout bilateral hip (except adduction 4/5), 4-/5 throughout bilateral knee    Time 4    Period Weeks    Status New    Target Date 03/28/21      PT LONG TERM GOAL #3   Title Patient will improve lumbar and cervical range of motion by 10% in order to perform ADL's safely.    Baseline limited cervical rotation 35 Lt rotation; Rt rotation 40, lumbar rotation 85%, side bending 70%.    Time 4    Period Weeks    Status New    Target Date 03/28/21      PT LONG TERM GOAL #4   Title Patient will be independent with final HEP and demonstrate understanding of correct form with exercises and stretches.    Baseline Inital HEP    Time 4    Period Weeks    Status New                   Plan - 04/29/21 1825     Clinical Impression Statement Patient continues to tolerate  treatment well. Therapy perfromed floating traction with the patient today to reduce soreness. The patient tolerated well. He also rperfromed sitting weighted exercises. He perfroemd scpa retraction with mnual resistance.    Personal Factors and Comorbidities Comorbidity 1;Comorbidity 2    Comorbidities myocardial infarction HCC, diabetes type 2, dysautonomia, orthostatic hypotension (patient states it has not occured in over a year)    Examination-Activity Limitations Bathing;Bend;Carry;Stairs;Stand;Transfers    Examination-Participation Restrictions Cleaning;Community Activity;Yard Work;Other    Stability/Clinical Decision Making Evolving/Moderate complexity    Clinical Decision Making Moderate    Rehab Potential Good    PT Frequency 2x / week    PT Duration 6 weeks    PT Treatment/Interventions ADLs/Self Care Home Management;Aquatic Therapy;Contrast Bath;Gait training;Stair training;Electrical Stimulation;Cryotherapy;Functional mobility training;Therapeutic activities;Therapeutic exercise;Balance training;Traction;Moist Heat;Manual techniques;Taping;Energy conservation;Dry needling;Neuromuscular re-education;Passive range of motion;Patient/family education;Joint Manipulations    PT Next Visit Plan Aquatic therapy intro, improve gait mechanics and improve balance by increasing core strenght and LE strength, progress HEP to improve lower extremity strength in hips and knees, manuel therapy to reduce upper trap tightness; lower back tightness.    PT Home Exercise Plan 3x10 banded seated hip abdcution, 3x10 seated banded hip flexion, 3x10 seated banded knee extension; all exercises done  in yellow band    Consulted and Agree with Plan of Care Patient;Family member/caregiver    Family Member Consulted wife             Patient will benefit from skilled therapeutic intervention in order to improve the following deficits and impairments:  Abnormal gait, Decreased activity tolerance, Decreased  balance, Decreased coordination, Decreased range of motion, Decreased mobility, Decreased endurance, Decreased strength, Difficulty walking, Hypomobility, Impaired sensation, Pain, Impaired UE functional use, Postural dysfunction  Visit Diagnosis: Chronic bilateral low back pain without sciatica  Other abnormalities of gait and mobility  Muscle weakness (generalized)  Cervicalgia     Problem List Patient Active Problem List   Diagnosis Date Noted   Orthostatic hypotension 01/30/2020   Hypertension 11/01/2018   Hyperlipidemia 99991111   Acute systolic heart failure ([place]) 11/01/2018   NSTEMI (non-ST elevated myocardial infarction) ([place]) 10/29/2018   Complex sleep apnea syndrome 07/10/2017   Treatment-emergent central sleep apnea 07/10/2017   Myocardial infarction ([employer]) 07/10/2017   Excessive daytime sleepiness 05/31/2017   Snoring 05/31/2017   Coronary artery disease involving native coronary artery of native heart without angina pectoris 05/31/2017   Intermittent palpitations 05/31/2017   Perspiration-excessive 05/31/2017   Dysautonomia orthostatic hypotension syndrome 05/31/2017   OA (osteoarthritis) of knee 07/19/2016   Acute medial meniscal tear 02/10/2015    Carney Living, PT 04/29/2021, 6:29 PM  [facility] [address] [place], [place], [address] Phone: [phone]   Fax:  [phone]  Name: Ralik Herrig MRN: [id number] Date of Birth: [birth year]

## 2021-05-03 ENCOUNTER — Encounter (HOSPITAL_BASED_OUTPATIENT_CLINIC_OR_DEPARTMENT_OTHER): Payer: Self-pay | Admitting: Physical Therapy

## 2021-05-03 ENCOUNTER — Other Ambulatory Visit: Payer: Self-pay

## 2021-05-03 ENCOUNTER — Ambulatory Visit (HOSPITAL_BASED_OUTPATIENT_CLINIC_OR_DEPARTMENT_OTHER): Payer: Medicare Other | Admitting: Physical Therapy

## 2021-05-03 DIAGNOSIS — M542 Cervicalgia: Secondary | ICD-10-CM

## 2021-05-03 DIAGNOSIS — M545 Low back pain, unspecified: Secondary | ICD-10-CM

## 2021-05-03 DIAGNOSIS — R2689 Other abnormalities of gait and mobility: Secondary | ICD-10-CM

## 2021-05-03 DIAGNOSIS — M6281 Muscle weakness (generalized): Secondary | ICD-10-CM

## 2021-05-03 DIAGNOSIS — G8929 Other chronic pain: Secondary | ICD-10-CM

## 2021-05-03 NOTE — Therapy (Signed)
[place], [place], [address] Phone: [phone]   Fax:  [phone]  Physical Therapy Treatment  Patient Details  Name: Rilan Eiland MRN: [id number] Date of Birth: 1951-02-29 Referring Provider (PT): Melina Schools, MD   Encounter Date: 05/03/2021   PT End of Session - 05/03/21 1117     Visit Number 12    Number of Visits 22    Date for PT Re-Evaluation 06/08/21    Authorization Type Medicare A&B and [employer] Supplemental progress note next visit    PT Start Time 1107   Patient switched to from the pool. Therapy had to get changed   PT Stop Time 1145    PT Time Calculation (min) 38 min    Activity Tolerance Patient tolerated treatment well    Behavior During Therapy WFL for tasks assessed/performed             Past Medical History:  Diagnosis Date   Acute systolic heart failure (HCC)    Anxiety    Arthritis    Cancer (HCC)    basal and squamous cell carcinoma   Chronic back pain    DDD (degenerative disc disease), thoracolumbar    Degenerative cervical spinal stenosis    Depression    Diabetic autonomic neuropathy (HCC)    Dyspnea    with exertion    GERD (gastroesophageal reflux disease)    Headache    History of bronchitis    History of kidney stones    Hyperlipidemia    Hypertension    Hypogonadism in male    Myocardial infarction [facility])    Neuropathy    NSTEMI (non-ST elevated myocardial infarction) ([place])    10/29/2018- PCI/DESx1 to pLAD, EF 35%   Orthostatic hypotension    OSA (obstructive sleep apnea)    per pt study done 2006 (approx)  used cpap few yrs then stopped using, stated didn't feel he needed it anymore   Psoriasis    Retinal tear    Right knee meniscal tear    Type 2 diabetes mellitus ([place])    Urinary hesitancy    Vitamin D deficiency     Past Surgical History:  Procedure Laterality Date   CATARACT EXTRACTION W/ INTRAOCULAR LENS  IMPLANT, BILATERAL  right  2015//  left 2012   CORONARY/GRAFT ACUTE MI REVASCULARIZATION N/A 10/30/2018   Procedure: Coronary/Graft Acute MI Revascularization;  Surgeon: Troy Sine, MD;  Location: [place] [facility];  Service: Cardiovascular;  Laterality: N/A;   CORRECTION REPAIR MULTIPLE  TOES , LEFT FOOT  YRS AGO   EXTRACORPOREAL SHOCK WAVE LITHOTRIPSY  2000 approx   KNEE ARTHROSCOPY Left 2007   KNEE ARTHROSCOPY Right 02/10/2015   Procedure: ARTHROSCOPY RIGHT KNEE WITH DEBRIDEMENT AND CHRONDROPLASTY;  Surgeon: Gaynelle Arabian, MD;  Location: [place];  Service: Orthopedics;  Laterality: Right;   LEFT HEART CATH AND CORONARY ANGIOGRAPHY N/A 10/30/2018   Procedure: LEFT HEART CATH AND CORONARY ANGIOGRAPHY;  Surgeon: Troy Sine, MD;  Location: [place] [facility];  Service: Cardiovascular;  Laterality: N/A;   PARTIAL KNEE ARTHROPLASTY Right 07/19/2016   Procedure: RIGHT KNEE MEDICAL UNICOMPARTMENTAL ARTHROPLASTY;  Surgeon: Gaynelle Arabian, MD;  [facility];  Service: Orthopedics;  Laterality: Right;   RADICAL ORCHIECTOMY Left 1996   benign   REMOVAL SPURS RIGHT GREAT TOE  YRS AGO   TOTAL KNEE ARTHROPLASTY Left 09-16-2007    There were no vitals filed for this visit.   Subjective Assessment - 05/03/21  1112     Subjective Patient reoirts he forgot his pool stuff today. He would like to do a land visit. Marland Kitchen    Pertinent History trouble sleeping, heart attack 2019, diabetes type 2, L4-5: Bilateral facet arthropathy with 7 mm anterolisthesis. Disc  degeneration with annular fissures and circumferential protrusion of  the disc. Stenosis of both lateral recesses and neural foramina that  could cause neural compression on either or both sides.    L5-S1: Disc degeneration with shallow disc protrusion but no  compressive effect upon the thecal sac or S1 nerves.    Limitations Sitting;Walking;Lifting;Standing    How long can you stand comfortably? limited 5-10,    How long can you walk comfortably? limited     Diagnostic tests fusion l4-l5 plan for patient.    Patient Stated Goals return to tennis, golf    Currently in Pain? Yes    Pain Location Back    Pain Orientation Right;Left    Pain Descriptors / Indicators Aching    Pain Type Chronic pain    Pain Onset More than a month ago    Pain Frequency Constant    Aggravating Factors  standing and walking    Pain Relieving Factors ying on his side    Effect of Pain on Daily Activities difficulty perfroming ADL's    Multiple Pain Sites No    Pain Orientation Right;Left    Pain Descriptors / Indicators Aching    Pain Type Chronic pain    Pain Onset More than a month ago    Pain Frequency Intermittent    Aggravating Factors  turing head    Pain Relieving Factors rest                               [facility] Adult PT Treatment/Exercise - 05/03/21 0001       Lumbar Exercises: Stretches   Active Hamstring Stretch Limitations reviewd seated hamstring stretch 3x20 sec hold      Lumbar Exercises: Machines for Strengthening   Other Lumbar Machine Exercise Patient advised with other exercises, that gym exercises ay be too much for today H erequested to try light weight as he will be joining the gym. row machine 5lbs 3x10; shoulder extnesion 3x10; reviewed use of leg press and knee xtension but no performed 2nd to fatigue      Lumbar Exercises: Seated   Other Seated Lumbar Exercises bilateral er seated red; cane flexion with postrue 2x10;                     PT Education - 05/03/21 1116     Education Details HEP and symptom management; patient given a land program    Person(s) Educated Patient    Methods Explanation;Demonstration;Tactile cues;Verbal cues    Comprehension Verbalized understanding;Returned demonstration;Verbal cues required;Tactile cues required              PT Short Term Goals - 03/21/21 1401       PT SHORT TERM GOAL #1   Title Patient will be indpendnet with basic pool program    Time 3     Period Weeks    Status On-going    Target Date 04/11/21      PT SHORT TERM GOAL #2   Title Patient will demonstrate 4+/5 gross bilateral LE strength    Time 3    Period Weeks    Status Achieved    Target Date  03/21/21      PT SHORT TERM GOAL #3   Title Patient will show improved cervical range of motion at 55 for left and right rotation.    Baseline 50/45    Time 3    Period Weeks    Status New    Target Date 04/11/21               PT Long Term Goals - 02/28/21 1506       PT LONG TERM GOAL #1   Title Patient will improve gait mechanics in order to amblate 700 feet with decreased fatigue in order to ambulate in the community.    Baseline limited walking    Time 4    Period Weeks    Status New    Target Date 03/28/21      PT LONG TERM GOAL #2   Title Patient will increase lower extremity strength in bilateral hips and knee in order to improve transfers from sitting to standing, and standing to sitting.    Baseline 3+/5 throughout bilateral hip (except adduction 4/5), 4-/5 throughout bilateral knee    Time 4    Period Weeks    Status New    Target Date 03/28/21      PT LONG TERM GOAL #3   Title Patient will improve lumbar and cervical range of motion by 10% in order to perform ADL's safely.    Baseline limited cervical rotation 35 Lt rotation; Rt rotation 40, lumbar rotation 85%, side bending 70%.    Time 4    Period Weeks    Status New    Target Date 03/28/21      PT LONG TERM GOAL #4   Title Patient will be independent with final HEP and demonstrate understanding of correct form with exercises and stretches.    Baseline Inital HEP    Time 4    Period Weeks    Status New                   Plan - 05/03/21 2151     Clinical Impression Statement Therapy reviewed exercises that he can perfrom at home. He was also intrested in gym exercises. He tolerated gym exercises but reported ftigue. He was shown which gym exercises he could likely perfrom but  was advised to go through them with therapy or with health and wellness profesionals. He may do furter land visits.    Personal Factors and Comorbidities Comorbidity 1;Comorbidity 2    Comorbidities myocardial infarction HCC, diabetes type 2, dysautonomia, orthostatic hypotension (patient states it has not occured in over a year)    Examination-Activity Limitations Bathing;Bend;Carry;Stairs;Stand;Transfers    Examination-Participation Restrictions Cleaning;Community Activity;Yard Work;Other    Stability/Clinical Decision Making Evolving/Moderate complexity    Clinical Decision Making Moderate    Rehab Potential Good    PT Frequency 2x / week    PT Duration 6 weeks    PT Treatment/Interventions ADLs/Self Care Home Management;Aquatic Therapy;Contrast Bath;Gait training;Stair training;Electrical Stimulation;Cryotherapy;Functional mobility training;Therapeutic activities;Therapeutic exercise;Balance training;Traction;Moist Heat;Manual techniques;Taping;Energy conservation;Dry needling;Neuromuscular re-education;Passive range of motion;Patient/family education;Joint Manipulations    PT Next Visit Plan Aquatic therapy intro, improve gait mechanics and improve balance by increasing core strenght and LE strength, progress HEP to improve lower extremity strength in hips and knees, manuel therapy to reduce upper trap tightness; lower back tightness.    PT Home Exercise Plan 3x10 banded seated hip abdcution, 3x10 seated banded hip flexion, 3x10 seated banded knee extension; all exercises done in yellow band  Consulted and Agree with Plan of Care Patient;Family member/caregiver    Family Member Consulted wife             Patient will benefit from skilled therapeutic intervention in order to improve the following deficits and impairments:  Abnormal gait, Decreased activity tolerance, Decreased balance, Decreased coordination, Decreased range of motion, Decreased mobility, Decreased endurance, Decreased  strength, Difficulty walking, Hypomobility, Impaired sensation, Pain, Impaired UE functional use, Postural dysfunction  Visit Diagnosis: Chronic bilateral low back pain without sciatica  Other abnormalities of gait and mobility  Muscle weakness (generalized)  Cervicalgia     Problem List Patient Active Problem List   Diagnosis Date Noted   Orthostatic hypotension 01/30/2020   Hypertension 11/01/2018   Hyperlipidemia 38/32/9191   Acute systolic heart failure ([place]) 11/01/2018   NSTEMI (non-ST elevated myocardial infarction) ([place]) 10/29/2018   Complex sleep apnea syndrome 07/10/2017   Treatment-emergent central sleep apnea 07/10/2017   Myocardial infarction ([place]) 07/10/2017   Excessive daytime sleepiness 05/31/2017   Snoring 05/31/2017   Coronary artery disease involving native coronary artery of native heart without angina pectoris 05/31/2017   Intermittent palpitations 05/31/2017   Perspiration-excessive 05/31/2017   Dysautonomia orthostatic hypotension syndrome 05/31/2017   OA (osteoarthritis) of knee 07/19/2016   Acute medial meniscal tear 02/10/2015    Carney Living, PT 05/03/2021, 10:04 PM  [facility] [address] [place], [place], [address] Phone: [phone]   Fax:  [phone]  Name: Aws Shere MRN: [id number] Date of Birth: 24-Nov-1950

## 2021-05-05 ENCOUNTER — Other Ambulatory Visit: Payer: Self-pay

## 2021-05-05 ENCOUNTER — Encounter (HOSPITAL_BASED_OUTPATIENT_CLINIC_OR_DEPARTMENT_OTHER): Payer: Self-pay | Admitting: Physical Therapy

## 2021-05-05 ENCOUNTER — Ambulatory Visit (HOSPITAL_BASED_OUTPATIENT_CLINIC_OR_DEPARTMENT_OTHER): Payer: Medicare Other | Admitting: Physical Therapy

## 2021-05-05 DIAGNOSIS — M545 Low back pain, unspecified: Secondary | ICD-10-CM

## 2021-05-05 DIAGNOSIS — G8929 Other chronic pain: Secondary | ICD-10-CM

## 2021-05-05 DIAGNOSIS — R2689 Other abnormalities of gait and mobility: Secondary | ICD-10-CM

## 2021-05-05 DIAGNOSIS — M542 Cervicalgia: Secondary | ICD-10-CM

## 2021-05-05 DIAGNOSIS — M6281 Muscle weakness (generalized): Secondary | ICD-10-CM

## 2021-05-05 NOTE — Therapy (Signed)
[facility] [address] [place], [place], [address] Phone: [phone]   Fax:  [phone]  Physical Therapy Treatment  Patient Details  Name: Devin Combs MRN: [id number] Date of Birth: 07/19/1951 Referring Provider (PT): Melina Schools, MD   Encounter Date: 05/05/2021   PT End of Session - 05/05/21 1215     Visit Number 13    Number of Visits 22    Date for PT Re-Evaluation 06/08/21    Authorization Type Medicare A&B and Aetna Supplemental progress note next visit    PT Start Time 4    PT Stop Time 1146    PT Time Calculation (min) 46 min    Activity Tolerance Patient tolerated treatment well    Behavior During Therapy [facility] for tasks assessed/performed             Past Medical History:  Diagnosis Date   Acute systolic heart failure ([place])    Anxiety    Arthritis    Cancer ([place])    basal and squamous cell carcinoma   Chronic back pain    DDD (degenerative disc disease), thoracolumbar    Degenerative cervical spinal stenosis    Depression    Diabetic autonomic neuropathy (HCC)    Dyspnea    with exertion    GERD (gastroesophageal reflux disease)    Headache    History of bronchitis    History of kidney stones    Hyperlipidemia    Hypertension    Hypogonadism in male    Myocardial infarction ([place])    Neuropathy    NSTEMI (non-ST elevated myocardial infarction) ([place])    10/29/2018- PCI/DESx1 to pLAD, EF 35%   Orthostatic hypotension    OSA (obstructive sleep apnea)    per pt study done 2006 (approx)  used cpap few yrs then stopped using, stated didn't feel he needed it anymore   Psoriasis    Retinal tear    Right knee meniscal tear    Type 2 diabetes mellitus ([place])    Urinary hesitancy    Vitamin D deficiency     Past Surgical History:  Procedure Laterality Date   CATARACT EXTRACTION W/ INTRAOCULAR LENS  IMPLANT, BILATERAL  right 2015//  left 2012   CORONARY/GRAFT ACUTE MI REVASCULARIZATION  N/A 10/30/2018   Procedure: Coronary/Graft Acute MI Revascularization;  Surgeon: Troy Sine, MD;  Location: [place] [facility];  Service: Cardiovascular;  Laterality: N/A;   CORRECTION REPAIR MULTIPLE  TOES , LEFT FOOT  YRS AGO   EXTRACORPOREAL SHOCK WAVE LITHOTRIPSY  2000 approx   KNEE ARTHROSCOPY Left 2007   KNEE ARTHROSCOPY Right 02/10/2015   Procedure: ARTHROSCOPY RIGHT KNEE WITH DEBRIDEMENT AND CHRONDROPLASTY;  Surgeon: Gaynelle Arabian, MD;  Location: [place];  Service: Orthopedics;  Laterality: Right;   LEFT HEART CATH AND CORONARY ANGIOGRAPHY N/A 10/30/2018   Procedure: LEFT HEART CATH AND CORONARY ANGIOGRAPHY;  Surgeon: Troy Sine, MD;  Location: [place] [facility];  Service: Cardiovascular;  Laterality: N/A;   PARTIAL KNEE ARTHROPLASTY Right 07/19/2016   Procedure: RIGHT KNEE MEDICAL UNICOMPARTMENTAL ARTHROPLASTY;  Surgeon: Gaynelle Arabian, MD;  [facility];  Service: Orthopedics;  Laterality: Right;   RADICAL ORCHIECTOMY Left 1996   benign   REMOVAL SPURS RIGHT GREAT TOE  YRS AGO   TOTAL KNEE ARTHROPLASTY Left 09-16-2007    There were no vitals filed for this visit.   Subjective Assessment - 05/05/21 1211     Subjective Patient reports he is better then  he was the other day. He was sore after the last visit but not too bad with the gym exercsies.    Pertinent History trouble sleeping, heart attack 2019, diabetes type 2, L4-5: Bilateral facet arthropathy with 7 mm anterolisthesis. Disc  degeneration with annular fissures and circumferential protrusion of  the disc. Stenosis of both lateral recesses and neural foramina that  could cause neural compression on either or both sides.    L5-S1: Disc degeneration with shallow disc protrusion but no  compressive effect upon the thecal sac or S1 nerves.    Limitations Sitting;Walking;Lifting;Standing    How long can you stand comfortably? limited 5-10,    How long can you walk comfortably? limited    Diagnostic  tests fusion l4-l5 plan for patient.    Currently in Pain? Yes    Pain Score 7     Pain Location Back    Pain Orientation Right;Left    Pain Descriptors / Indicators Aching    Pain Type Chronic pain    Pain Onset More than a month ago    Pain Frequency Constant    Aggravating Factors  standing and walking    Pain Relieving Factors lying on the side    Multiple Pain Sites No                   Pt seen for aquatic therapy today.  Treatment took place in water 3.25-4 ft in depth at the [facility] pool. Temp of water was 91.  Pt entered/exited the pool via stairs (step through pattern) independently with bilat rail.   Warm up: heel/toe walking x4 laps across pool  side stepping x4 laps from    Ambulation with noodle support: long strides, march 2 laps    Exercises; Slow march x20; hip extension x20; hip abduction x20.      Seated LAQ and March 2x20. 2 lb weight    Seated board flexion x20. Seated board rotation   Seated board press down x20.     Seated Noodle press x20 yellow      noodle row x20 blue    Step ups x25 each leg  lateral step ups x25 3lb weight      With neck noddle and foot float; push pull x20; side to side perturbations from the feet x20; press and push x20   Pt requires buoyancy for support and to offload joints with strengthening exercises. Viscosity of the water is needed for resistance of strengthening; water current perturbations provides challenge to standing balance unsupported, requiring increased core activation.;                       PT Education - 05/05/21 1214     Education Details reviewed HEP and symtom mangement    Person(s) Educated Patient    Methods Explanation;Demonstration;Tactile cues;Verbal cues    Comprehension Verbalized understanding;Returned demonstration;Verbal cues required;Tactile cues required              PT Short Term Goals - 03/21/21 1401       PT SHORT TERM GOAL #1   Title  Patient will be indpendnet with basic pool program    Time 3    Period Weeks    Status On-going    Target Date 04/11/21      PT SHORT TERM GOAL #2   Title Patient will demonstrate 4+/5 gross bilateral LE strength    Time 3    Period Weeks  Status Achieved    Target Date 03/21/21      PT SHORT TERM GOAL #3   Title Patient will show improved cervical range of motion at 55 for left and right rotation.    Baseline 50/45    Time 3    Period Weeks    Status New    Target Date 04/11/21               PT Long Term Goals - 02/28/21 1506       PT LONG TERM GOAL #1   Title Patient will improve gait mechanics in order to amblate 700 feet with decreased fatigue in order to ambulate in the community.    Baseline limited walking    Time 4    Period Weeks    Status New    Target Date 03/28/21      PT LONG TERM GOAL #2   Title Patient will increase lower extremity strength in bilateral hips and knee in order to improve transfers from sitting to standing, and standing to sitting.    Baseline 3+/5 throughout bilateral hip (except adduction 4/5), 4-/5 throughout bilateral knee    Time 4    Period Weeks    Status New    Target Date 03/28/21      PT LONG TERM GOAL #3   Title Patient will improve lumbar and cervical range of motion by 10% in order to perform ADL's safely.    Baseline limited cervical rotation 35 Lt rotation; Rt rotation 40, lumbar rotation 85%, side bending 70%.    Time 4    Period Weeks    Status New    Target Date 03/28/21      PT LONG TERM GOAL #4   Title Patient will be independent with final HEP and demonstrate understanding of correct form with exercises and stretches.    Baseline Inital HEP    Time 4    Period Weeks    Status New                   Plan - 05/05/21 1218     Clinical Impression Statement Patient continues to tolerate exercises well. We will continue to work on getting him as strong a spossible prior to the surgery. he will be  joining the gym today,    Personal Factors and Comorbidities Comorbidity 1;Comorbidity 2    Comorbidities myocardial infarction HCC, diabetes type 2, dysautonomia, orthostatic hypotension (patient states it has not occured in over a year)    Examination-Activity Limitations Bathing;Bend;Carry;Stairs;Stand;Transfers    Examination-Participation Restrictions Cleaning;Community Activity;Yard Work;Other    Stability/Clinical Decision Making Evolving/Moderate complexity    Clinical Decision Making Moderate    Rehab Potential Good    PT Frequency 2x / week    PT Duration 6 weeks    PT Treatment/Interventions ADLs/Self Care Home Management;Aquatic Therapy;Contrast Bath;Gait training;Stair training;Electrical Stimulation;Cryotherapy;Functional mobility training;Therapeutic activities;Therapeutic exercise;Balance training;Traction;Moist Heat;Manual techniques;Taping;Energy conservation;Dry needling;Neuromuscular re-education;Passive range of motion;Patient/family education;Joint Manipulations    PT Next Visit Plan Aquatic therapy intro, improve gait mechanics and improve balance by increasing core strenght and LE strength, progress HEP to improve lower extremity strength in hips and knees, manuel therapy to reduce upper trap tightness; lower back tightness.    PT Home Exercise Plan 3x10 banded seated hip abdcution, 3x10 seated banded hip flexion, 3x10 seated banded knee extension; all exercises done in yellow band    Consulted and Agree with Plan of Care Patient;Family member/caregiver    Family Member Consulted  wife             Patient will benefit from skilled therapeutic intervention in order to improve the following deficits and impairments:  Abnormal gait, Decreased activity tolerance, Decreased balance, Decreased coordination, Decreased range of motion, Decreased mobility, Decreased endurance, Decreased strength, Difficulty walking, Hypomobility, Impaired sensation, Pain, Impaired UE functional  use, Postural dysfunction  Visit Diagnosis: Chronic bilateral low back pain without sciatica  Other abnormalities of gait and mobility  Muscle weakness (generalized)  Cervicalgia     Problem List Patient Active Problem List   Diagnosis Date Noted   Orthostatic hypotension 01/30/2020   Hypertension 11/01/2018   Hyperlipidemia 55/97/4163   Acute systolic heart failure ([place]) 11/01/2018   NSTEMI (non-ST elevated myocardial infarction) ([place]) 10/29/2018   Complex sleep apnea syndrome 07/10/2017   Treatment-emergent central sleep apnea 07/10/2017   Myocardial infarction ([place]) 07/10/2017   Excessive daytime sleepiness 05/31/2017   Snoring 05/31/2017   Coronary artery disease involving native coronary artery of native heart without angina pectoris 05/31/2017   Intermittent palpitations 05/31/2017   Perspiration-excessive 05/31/2017   Dysautonomia orthostatic hypotension syndrome 05/31/2017   OA (osteoarthritis) of knee 07/19/2016   Acute medial meniscal tear 02/10/2015    Carney Living, PT 05/05/2021, 12:27 PM  [facility] [address] [place], [place], [address] Phone: [phone]   Fax:  [phone]  Name: Devin Combs MRN: [id number] Date of Birth: [birth year]

## 2021-05-10 ENCOUNTER — Ambulatory Visit (HOSPITAL_BASED_OUTPATIENT_CLINIC_OR_DEPARTMENT_OTHER): Payer: Medicare Other | Admitting: Physical Therapy

## 2021-05-10 ENCOUNTER — Other Ambulatory Visit: Payer: Self-pay

## 2021-05-10 DIAGNOSIS — M545 Low back pain, unspecified: Secondary | ICD-10-CM

## 2021-05-10 DIAGNOSIS — M542 Cervicalgia: Secondary | ICD-10-CM

## 2021-05-10 DIAGNOSIS — R2689 Other abnormalities of gait and mobility: Secondary | ICD-10-CM

## 2021-05-10 DIAGNOSIS — G8929 Other chronic pain: Secondary | ICD-10-CM

## 2021-05-10 DIAGNOSIS — M6281 Muscle weakness (generalized): Secondary | ICD-10-CM

## 2021-05-11 NOTE — Therapy (Signed)
[facility] [address] [place], [place], [address] Phone: [phone]   Fax:  [phone]  Physical Therapy Treatment  Patient Details  Name: Devin Combs MRN: [id number] Date of Birth: 04/10/1951 Referring Provider (PT): Melina Schools, MD   Encounter Date: 05/10/2021   PT End of Session - 05/11/21 1544     Visit Number 14    Number of Visits 22    Date for PT Re-Evaluation 06/08/21    Authorization Type Medicare A&B and [employer] Supplemental progress note next visit    PT Start Time 24    PT Stop Time 1142    PT Time Calculation (min) 42 min    Activity Tolerance Patient tolerated treatment well    Behavior During Therapy [facility] for tasks assessed/performed             Past Medical History:  Diagnosis Date   Acute systolic heart failure ([place])    Anxiety    Arthritis    Cancer ([place])    basal and squamous cell carcinoma   Chronic back pain    DDD (degenerative disc disease), thoracolumbar    Degenerative cervical spinal stenosis    Depression    Diabetic autonomic neuropathy ([facility])    Dyspnea    with exertion    GERD (gastroesophageal reflux disease)    Headache    History of bronchitis    History of kidney stones    Hyperlipidemia    Hypertension    Hypogonadism in male    Myocardial infarction ([place])    Neuropathy    NSTEMI (non-ST elevated myocardial infarction) ([place])    10/29/2018- PCI/DESx1 to pLAD, EF 35%   Orthostatic hypotension    OSA (obstructive sleep apnea)    per pt study done 2006 (approx)  used cpap few yrs then stopped using, stated didn't feel he needed it anymore   Psoriasis    Retinal tear    Right knee meniscal tear    Type 2 diabetes mellitus ([place])    Urinary hesitancy    Vitamin D deficiency     Past Surgical History:  Procedure Laterality Date   CATARACT EXTRACTION W/ INTRAOCULAR LENS  IMPLANT, BILATERAL  right 2015//  left 2012   CORONARY/GRAFT ACUTE MI REVASCULARIZATION  N/A 10/30/2018   Procedure: Coronary/Graft Acute MI Revascularization;  Surgeon: Troy Sine, MD;  Location: [facility];  Service: Cardiovascular;  Laterality: N/A;   CORRECTION REPAIR MULTIPLE  TOES , LEFT FOOT  YRS AGO   EXTRACORPOREAL SHOCK WAVE LITHOTRIPSY  2000 approx   KNEE ARTHROSCOPY Left 2007   KNEE ARTHROSCOPY Right 02/10/2015   Procedure: ARTHROSCOPY RIGHT KNEE WITH DEBRIDEMENT AND CHRONDROPLASTY;  Surgeon: Gaynelle Arabian, MD;  Location: [facility];  Service: Orthopedics;  Laterality: Right;   LEFT HEART CATH AND CORONARY ANGIOGRAPHY N/A 10/30/2018   Procedure: LEFT HEART CATH AND CORONARY ANGIOGRAPHY;  Surgeon: Troy Sine, MD;  Location: [facility];  Service: Cardiovascular;  Laterality: N/A;   PARTIAL KNEE ARTHROPLASTY Right 07/19/2016   Procedure: RIGHT KNEE MEDICAL UNICOMPARTMENTAL ARTHROPLASTY;  Surgeon: Gaynelle Arabian, MD;  [facility]: [facility];  Service: Orthopedics;  Laterality: Right;   RADICAL ORCHIECTOMY Left 1996   benign   REMOVAL SPURS RIGHT GREAT TOE  YRS AGO   TOTAL KNEE ARTHROPLASTY Left 09-16-2007    There were no vitals filed for this visit.   Subjective Assessment - 05/10/21 1555     Subjective Patient reports his back is doing  good. He hasn't done much today, so he isn't in too much pain.    Pertinent History trouble sleeping, heart attack 2019, diabetes type 2, L4-5: Bilateral facet arthropathy with 7 mm anterolisthesis. Disc  degeneration with annular fissures and circumferential protrusion of  the disc. Stenosis of both lateral recesses and neural foramina that  could cause neural compression on either or both sides.    L5-S1: Disc degeneration with shallow disc protrusion but no  compressive effect upon the thecal sac or S1 nerves.    Limitations Sitting;Walking;Lifting;Standing    How long can you stand comfortably? limited 5-10,    How long can you walk comfortably? limited    Diagnostic tests fusion l4-l5 plan for  patient.    Patient Stated Goals return to tennis, golf    Currently in Pain? Yes    Pain Score 4     Pain Location Back    Pain Orientation Right;Left    Pain Descriptors / Indicators Aching    Pain Type Chronic pain    Pain Onset More than a month ago    Pain Frequency Constant    Aggravating Factors  standing and walkign    Pain Relieving Factors lying on his side    Effect of Pain on Daily Activities difficulty perfroming ADL's    Multiple Pain Sites No                Pt seen for aquatic therapy today.  Treatment took place in water 3.25-4 ft in depth at the [facility] pool. Temp of water was 91.  Pt entered/exited the pool via stairs (step through pattern) independently with bilat rail.   Warm up: heel/toe walking x4 laps across pool  side stepping x4 laps from    Ambulation with noodle support: long strides, march 2 laps    Exercises; Slow march x20; hip extension x20; hip abduction x20.      Seated LAQ and March 2x20. 2 lb weight    Seated board flexion x20. Seated board rotation   Seated board press down x20.     Seated Noodle press x20 yellow      noodle row x20 blue    Step ups x25 each leg  lateral step ups x25 3lb weight        Pt requires buoyancy for support and to offload joints with strengthening exercises. Viscosity of the water is needed for resistance of strengthening; water current perturbations provides challenge to standing balance unsupported, requiring increased core activation.;                        PT Education - 05/11/21 1544     Education Details HEP and symptom management    Person(s) Educated Patient    Methods Explanation;Tactile cues;Demonstration;Verbal cues    Comprehension Verbalized understanding;Returned demonstration;Verbal cues required;Tactile cues required              PT Short Term Goals - 03/21/21 1401       PT SHORT TERM GOAL #1   Title Patient will be indpendnet with basic  pool program    Time 3    Period Weeks    Status On-going    Target Date 04/11/21      PT SHORT TERM GOAL #2   Title Patient will demonstrate 4+/5 gross bilateral LE strength    Time 3    Period Weeks    Status Achieved    Target Date 03/21/21  PT SHORT TERM GOAL #3   Title Patient will show improved cervical range of motion at 55 for left and right rotation.    Baseline 50/45    Time 3    Period Weeks    Status New    Target Date 04/11/21               PT Long Term Goals - 02/28/21 1506       PT LONG TERM GOAL #1   Title Patient will improve gait mechanics in order to amblate 700 feet with decreased fatigue in order to ambulate in the community.    Baseline limited walking    Time 4    Period Weeks    Status New    Target Date 03/28/21      PT LONG TERM GOAL #2   Title Patient will increase lower extremity strength in bilateral hips and knee in order to improve transfers from sitting to standing, and standing to sitting.    Baseline 3+/5 throughout bilateral hip (except adduction 4/5), 4-/5 throughout bilateral knee    Time 4    Period Weeks    Status New    Target Date 03/28/21      PT LONG TERM GOAL #3   Title Patient will improve lumbar and cervical range of motion by 10% in order to perform ADL's safely.    Baseline limited cervical rotation 35 Lt rotation; Rt rotation 40, lumbar rotation 85%, side bending 70%.    Time 4    Period Weeks    Status New    Target Date 03/28/21      PT LONG TERM GOAL #4   Title Patient will be independent with final HEP and demonstrate understanding of correct form with exercises and stretches.    Baseline Inital HEP    Time 4    Period Weeks    Status New                   Plan - 05/11/21 1545     Clinical Impression Statement Patient tolerated pool exercises well. he feels like his surgery iwll be in [place]. We will continue to progress exercises as tolerated.    Personal Factors and Comorbidities  Comorbidity 1;Comorbidity 2    Comorbidities myocardial infarction HCC, diabetes type 2, dysautonomia, orthostatic hypotension (patient states it has not occured in over a year)    Examination-Activity Limitations Bathing;Bend;Carry;Stairs;Stand;Transfers    Examination-Participation Restrictions Cleaning;Community Activity;Yard Work;Other    Stability/Clinical Decision Making Evolving/Moderate complexity    Clinical Decision Making Moderate    Rehab Potential Good    PT Frequency 2x / week    PT Duration 6 weeks    PT Treatment/Interventions ADLs/Self Care Home Management;Aquatic Therapy;Contrast Bath;Gait training;Stair training;Electrical Stimulation;Cryotherapy;Functional mobility training;Therapeutic activities;Therapeutic exercise;Balance training;Traction;Moist Heat;Manual techniques;Taping;Energy conservation;Dry needling;Neuromuscular re-education;Passive range of motion;Patient/family education;Joint Manipulations    PT Next Visit Plan Aquatic therapy intro, improve gait mechanics and improve balance by increasing core strenght and LE strength, progress HEP to improve lower extremity strength in hips and knees, manuel therapy to reduce upper trap tightness; lower back tightness.    PT Home Exercise Plan 3x10 banded seated hip abdcution, 3x10 seated banded hip flexion, 3x10 seated banded knee extension; all exercises done in yellow band    Consulted and Agree with Plan of Care Patient;Family member/caregiver    Family Member Consulted wife             Patient will benefit from skilled therapeutic intervention  in order to improve the following deficits and impairments:  Abnormal gait, Decreased activity tolerance, Decreased balance, Decreased coordination, Decreased range of motion, Decreased mobility, Decreased endurance, Decreased strength, Difficulty walking, Hypomobility, Impaired sensation, Pain, Impaired UE functional use, Postural dysfunction  Visit Diagnosis: Chronic bilateral  low back pain without sciatica  Other abnormalities of gait and mobility  Muscle weakness (generalized)  Cervicalgia     Problem List Patient Active Problem List   Diagnosis Date Noted   Orthostatic hypotension 01/30/2020   Hypertension 11/01/2018   Hyperlipidemia 61/44/3154   Acute systolic heart failure ([place]) 11/01/2018   NSTEMI (non-ST elevated myocardial infarction) ([place]) 10/29/2018   Complex sleep apnea syndrome 07/10/2017   Treatment-emergent central sleep apnea 07/10/2017   Myocardial infarction ([place]) 07/10/2017   Excessive daytime sleepiness 05/31/2017   Snoring 05/31/2017   Coronary artery disease involving native coronary artery of native heart without angina pectoris 05/31/2017   Intermittent palpitations 05/31/2017   Perspiration-excessive 05/31/2017   Dysautonomia orthostatic hypotension syndrome 05/31/2017   OA (osteoarthritis) of knee 07/19/2016   Acute medial meniscal tear 02/10/2015    Carney Living, PT 05/11/2021, 3:47 PM  [facility] [address] [place], [place], [address] Phone: [phone]   Fax:  [phone]  Name: Devin Combs MRN: [id number] Date of Birth: 03-Mar-1951

## 2021-05-13 ENCOUNTER — Ambulatory Visit (HOSPITAL_BASED_OUTPATIENT_CLINIC_OR_DEPARTMENT_OTHER): Payer: Medicare Other | Admitting: Physical Therapy

## 2021-05-13 ENCOUNTER — Other Ambulatory Visit: Payer: Self-pay

## 2021-05-16 ENCOUNTER — Ambulatory Visit: Payer: Self-pay | Admitting: Orthopedic Surgery

## 2021-05-16 NOTE — H&P (Signed)
Subjective:   PMH: OSA, DM (A1C 6.1), CAD (on ASA), chronic pain (fentanyl, cymbalta, hydrocodone 10/acetaminophen 325) CC: chronic pain and bilateral neurogenic claudication OLIF L4-5 [place] 05/26/21  Patient Active Problem List   Diagnosis Date Noted   Orthostatic hypotension 01/30/2020   Hypertension 11/01/2018   Hyperlipidemia 26/94/8546   Acute systolic heart failure ([place]) 11/01/2018   NSTEMI (non-ST elevated myocardial infarction) ([place]) 10/29/2018   Complex sleep apnea syndrome 07/10/2017   Treatment-emergent central sleep apnea 07/10/2017   Myocardial infarction ([place]) 07/10/2017   Excessive daytime sleepiness 05/31/2017   Snoring 05/31/2017   Coronary artery disease involving native coronary artery of native heart without angina pectoris 05/31/2017   Intermittent palpitations 05/31/2017   Perspiration-excessive 05/31/2017   Dysautonomia orthostatic hypotension syndrome 05/31/2017   OA (osteoarthritis) of knee 07/19/2016   Acute medial meniscal tear 02/10/2015   Past Medical History:  Diagnosis Date   Acute systolic heart failure (H[facility])    Anxiety    Arthritis    Cancer ([facility])    basal and squamous cell carcinoma   Chronic back pain    DDD (degenerative disc disease), thoracolumbar    Degenerative cervical spinal stenosis    Depression    Diabetic autonomic neuropathy ([facility])    Dyspnea    with exertion    GERD (gastroesophageal reflux disease)    Headache    History of bronchitis    History of kidney stones    Hyperlipidemia    Hypertension    Hypogonadism in male    Myocardial infarction ([place])    Neuropathy    NSTEMI (non-ST elevated myocardial infarction) ([place])    10/29/2018- PCI/DESx1 to pLAD, EF 35%   Orthostatic hypotension    OSA (obstructive sleep apnea)    per pt study done 2006 (approx)  used cpap few yrs then stopped using, stated didn't feel he needed it anymore   Psoriasis    Retinal tear    Right knee meniscal tear    Type 2 diabetes mellitus  ([place])    Urinary hesitancy    Vitamin D deficiency     Past Surgical History:  Procedure Laterality Date   CATARACT EXTRACTION W/ INTRAOCULAR LENS  IMPLANT, BILATERAL  right 2015//  left 2012   CORONARY/GRAFT ACUTE MI REVASCULARIZATION N/A 10/30/2018   Procedure: Coronary/Graft Acute MI Revascularization;  Surgeon: Troy Sine, MD;  Location: [facility];  Service: Cardiovascular;  Laterality: N/A;   CORRECTION REPAIR MULTIPLE  TOES , LEFT FOOT  YRS AGO   EXTRACORPOREAL SHOCK WAVE LITHOTRIPSY  2000 approx   KNEE ARTHROSCOPY Left 2007   KNEE ARTHROSCOPY Right 02/10/2015   Procedure: ARTHROSCOPY RIGHT KNEE WITH DEBRIDEMENT AND CHRONDROPLASTY;  Surgeon: Gaynelle Arabian, MD;  Location: [facility];  Service: Orthopedics;  Laterality: Right;   LEFT HEART CATH AND CORONARY ANGIOGRAPHY N/A 10/30/2018   Procedure: LEFT HEART CATH AND CORONARY ANGIOGRAPHY;  Surgeon: Troy Sine, MD;  Location: [facility];  Service: Cardiovascular;  Laterality: N/A;   PARTIAL KNEE ARTHROPLASTY Right 07/19/2016   Procedure: RIGHT KNEE MEDICAL UNICOMPARTMENTAL ARTHROPLASTY;  Surgeon: Gaynelle Arabian, MD;  Location: [facility];  Service: Orthopedics;  Laterality: Right;   RADICAL ORCHIECTOMY Left 1996   benign   REMOVAL SPURS RIGHT GREAT TOE  YRS AGO   TOTAL KNEE ARTHROPLASTY Left 09-16-2007    Current Outpatient Medications  Medication Sig Dispense Refill Last Dose   amphetamine-dextroamphetamine (ADDERALL XR) 30 MG 24 hr capsule Take 30 mg by mouth daily.  Apoaequorin (PREVAGEN) 10 MG CAPS Take 10 mg by mouth daily.       aspirin EC 81 MG EC tablet Take 1 tablet (81 mg total) by mouth daily. 30 tablet 1    atorvastatin (LIPITOR) 80 MG tablet TAKE 1 TABLET BY MOUTH EVERY DAY AT 6PM 90 tablet 0    buPROPion (WELLBUTRIN XL) 300 MG 24 hr tablet Take 300 mg by mouth daily.      carvedilol (COREG) 6.25 MG tablet TAKE 1 TABLET (6.25 MG TOTAL) BY MOUTH 2 (TWO) TIMES DAILY WITH A MEAL. 180  tablet 2    DULoxetine (CYMBALTA) 60 MG capsule Take 60 mg by mouth every evening.      fentaNYL (DURAGESIC) 75 MCG/HR Place 1 patch onto the skin every 3 (three) days.      folic acid (FOLVITE) 1 MG tablet Take 1 mg by mouth daily.      furosemide (LASIX) 20 MG tablet TAKE 1 TABLET BY MOUTH THREE TIMES A DAY AS NEEDED BETWEEN MEALS AND AT BEDTIME 270 tablet 0    HYDROcodone-acetaminophen (NORCO) 10-325 MG tablet Take 1 tablet by mouth 4 (four) times daily.       Hyoscyamine Sulfate SL 0.125 MG SUBL Place 1 tablet under the tongue as needed for constipation.      losartan (COZAAR) 50 MG tablet Take 1 tablet (50 mg total) by mouth daily. 90 tablet 3    metFORMIN (GLUCOPHAGE) 1000 MG tablet Take 1,000 mg by mouth 2 (two) times daily with a meal.      nitroGLYCERIN (NITROSTAT) 0.4 MG SL tablet Place 1 tablet (0.4 mg total) under the tongue every 5 (five) minutes x 3 doses as needed for chest pain. 25 tablet 2    oxymetazoline (AFRIN) 0.05 % nasal spray Place 1 spray into both nostrils 2 (two) times daily as needed for congestion.      pioglitazone (ACTOS) 30 MG tablet Take 30 mg by mouth every morning.      ranitidine (ZANTAC) 150 MG capsule Take 150 mg by mouth daily as needed for heartburn.      Testosterone 20.25 MG/ACT (1.62%) GEL daily.      TRULICITY 1.5 NF/6.2ZH SOPN Inject 0.5 mLs into the skin every Saturday.       valACYclovir (VALTREX) 1000 MG tablet Take 1,000 mg by mouth daily as needed (outbreaks).       No current facility-administered medications for this visit.   No Known Allergies  Social History   Tobacco Use   Smoking status: Never   Smokeless tobacco: Never  Substance Use Topics   Alcohol use: Yes    Comment: rare    Family History  Problem Relation Age of Onset   Neuropathy Neg Hx     Review of Systems Pertinent items are noted in HPI.  Objective:   Vitals: BP: 150/110 Pulse: 81 bpm  Clinical exam: Patient is alert and oriented 3. No shortness of breath,  chest pain. Heart: Regular rate and rhythm, no rubs, murmurs, or gallops Lungs: Clear auscultation bilaterally Abdomen: Soft and nontender. Bowel sounds 4. Nondistended. No rebound tenderness. No loss of bladder or bowel control Lumbar spine: Ongoing significant back pain radiating into the lower extremity. Significant pain with range of motion and palpation. No SI joint pain. Neuro: Intermittent radiculopathy and radicular pain especially in the right lower extremity. Negative straight leg raise test. 5/5 motor strength. Ambulating with the assistance of a cane.  MRI: Severe bilateral L4-5 neural foraminal narrowing causing  impingement on the exiting L4 nerve roots. Mild to moderate central stenosis. Mild to moderate foraminal narrowing L3-4 left worse than the right, and mild to moderate foraminal narrowing L2-3. Small central disc protrusion at L5-S1. There is also a slight anterior grade 1 listhesis at L4-5.  Assessment:   Yvone Neu returns to for follow-up. Despite injection therapy and prolonged physical therapy his quality-of-life inability to function in terms of activities of daily living have all deteriorated because of his severe back buttock and neuropathic leg pain. He is expressed a desire to move forward with surgery. Based on the imaging studies I believe the primary pathology is the foraminal and lateral recess stenosis at L4-5 due to the underlying spondylolisthesis. As a result I think the best surgical option would be an oblique lumbar interbody fusion at L4-5 with supplemental posterior pedicle screw fixation. OLIF/XLIF risks, benefits of surgery were reviewed with the patient. These include: infection, bleeding, death, stroke, paralysis, ongoing or worse pain, need for additional surgery, injury to the lumbar plexus resulting in hip flexor weakness and difficulty walking without assistive devices. Adjacent segment degenerative disease, need for additional surgery including fusing other  levels, leak of spinal fluid, Nonunion, hardware failure, breakage, or mal-position. Deep venous thrombosis (DVT) requiring additional treatment such as filter, and/or medications. Injury to abdominal contents, loss in bowel and bladder control. Risks and benefits of spinal fusion: Infection, bleeding, death, stroke, paralysis, ongoing or worse pain, need for additional surgery, nonunion, leak of spinal fluid, adjacent segment degeneration requiring additional fusion surgery, Injury to abdominal vessels that can require anterior surgery to stop bleeding. Malposition of the cage and/or pedicle screws that could require additional surgery. Loss of bowel and bladder control. Postoperative hematoma causing neurologic compression that could require urgent or emergent re-operation.  Plan:   Oblique lumbar interbody fusion lumbar 4 through 5 posterior pedicle screw fixation  We will obtain preoperative medical clearance from the patient's primary care provider and his cardiologist. He will hold his aspirin 7 days prior to surgery and we will plan to resume 48 hours after surgery  We have also discussed the post-operative recovery period to include: bathing/showering restrictions, wound healing, activity (and driving) restrictions, medications/pain mangement. I discussed with the patient that he will need to get all of his postoperative pain medications from his pain management provider. He expressed understanding of this.  We have also discussed post-operative redflags to include: signs and symptoms of postoperative infection, DVT/PE.  Patient was given a copy of his discharge instructions which were reviewed with him today.  Patient has upcoming appointment with vascular surgeon  Patient has upcoming appointment with PT for brace fitting  Patient has upcoming appointment with [facility] preop All questions were invited and answered  Follow-up: 2 weeks postop

## 2021-05-16 NOTE — H&P (Deleted)
  The note originally documented on this encounter has been moved the the encounter in which it belongs.  

## 2021-05-17 ENCOUNTER — Encounter (HOSPITAL_BASED_OUTPATIENT_CLINIC_OR_DEPARTMENT_OTHER): Payer: Self-pay | Admitting: Physical Therapy

## 2021-05-17 ENCOUNTER — Other Ambulatory Visit: Payer: Self-pay

## 2021-05-17 ENCOUNTER — Ambulatory Visit (HOSPITAL_BASED_OUTPATIENT_CLINIC_OR_DEPARTMENT_OTHER): Payer: Medicare Other | Attending: Orthopedic Surgery | Admitting: Physical Therapy

## 2021-05-17 DIAGNOSIS — M545 Low back pain, unspecified: Secondary | ICD-10-CM | POA: Diagnosis not present

## 2021-05-17 DIAGNOSIS — M6281 Muscle weakness (generalized): Secondary | ICD-10-CM | POA: Insufficient documentation

## 2021-05-17 DIAGNOSIS — D225 Melanocytic nevi of trunk: Secondary | ICD-10-CM | POA: Insufficient documentation

## 2021-05-17 DIAGNOSIS — M542 Cervicalgia: Secondary | ICD-10-CM | POA: Insufficient documentation

## 2021-05-17 DIAGNOSIS — R2689 Other abnormalities of gait and mobility: Secondary | ICD-10-CM | POA: Diagnosis present

## 2021-05-17 DIAGNOSIS — Z8589 Personal history of malignant neoplasm of other organs and systems: Secondary | ICD-10-CM | POA: Insufficient documentation

## 2021-05-17 DIAGNOSIS — G8929 Other chronic pain: Secondary | ICD-10-CM | POA: Insufficient documentation

## 2021-05-17 DIAGNOSIS — L814 Other melanin hyperpigmentation: Secondary | ICD-10-CM | POA: Insufficient documentation

## 2021-05-17 DIAGNOSIS — L821 Other seborrheic keratosis: Secondary | ICD-10-CM | POA: Insufficient documentation

## 2021-05-18 NOTE — Therapy (Addendum)
[facility] [address] [place], [place], [address] Phone: [phone]   Fax:  [phone]  Physical Therapy Treatment/Discharge   Patient Details  Name: Devin Combs MRN: [id number] Date of Birth: [birth year] Referring Provider (PT): Melina Schools, MD   Encounter Date: 05/17/2021   PT End of Session - 05/17/21 1625     Visit Number 15    Number of Visits 22    Date for PT Re-Evaluation 06/08/21    Authorization Type Medicare A&B and [employer] Supplemental progress note next visit    PT Start Time 1103    PT Stop Time 1143    PT Time Calculation (min) 40 min    Activity Tolerance Patient tolerated treatment well    Behavior During Therapy [facility] for tasks assessed/performed             Past Medical History:  Diagnosis Date   Acute systolic heart failure ([place])    Anxiety    Arthritis    Cancer ([place])    basal and squamous cell carcinoma   Chronic back pain    DDD (degenerative disc disease), thoracolumbar    Degenerative cervical spinal stenosis    Depression    Diabetic autonomic neuropathy ([place])    Dyspnea    with exertion    GERD (gastroesophageal reflux disease)    Headache    History of bronchitis    History of kidney stones    Hyperlipidemia    Hypertension    Hypogonadism in male    Myocardial infarction ([place])    Neuropathy    NSTEMI (non-ST elevated myocardial infarction) ([place])    10/29/2018- PCI/DESx1 to pLAD, EF 35%   Orthostatic hypotension    OSA (obstructive sleep apnea)    per pt study done 2006 (approx)  used cpap few yrs then stopped using, stated didn't feel he needed it anymore   Psoriasis    Retinal tear    Right knee meniscal tear    Type 2 diabetes mellitus ([place])    Urinary hesitancy    Vitamin D deficiency     Past Surgical History:  Procedure Laterality Date   CATARACT EXTRACTION W/ INTRAOCULAR LENS  IMPLANT, BILATERAL  right 2015//  left 2012   CORONARY/GRAFT ACUTE MI  REVASCULARIZATION N/A 10/30/2018   Procedure: Coronary/Graft Acute MI Revascularization;  Surgeon: Troy Sine, MD;  Location: [facility];  Service: Cardiovascular;  Laterality: N/A;   CORRECTION REPAIR MULTIPLE  TOES , LEFT FOOT  YRS AGO   EXTRACORPOREAL SHOCK WAVE LITHOTRIPSY  2000 approx   KNEE ARTHROSCOPY Left 2007   KNEE ARTHROSCOPY Right 02/10/2015   Procedure: ARTHROSCOPY RIGHT KNEE WITH DEBRIDEMENT AND CHRONDROPLASTY;  Surgeon: Gaynelle Arabian, MD;  Location: [facility];  Service: Orthopedics;  Laterality: Right;   LEFT HEART CATH AND CORONARY ANGIOGRAPHY N/A 10/30/2018   Procedure: LEFT HEART CATH AND CORONARY ANGIOGRAPHY;  Surgeon: Troy Sine, MD;  Location: [facility];  Service: Cardiovascular;  Laterality: N/A;   PARTIAL KNEE ARTHROPLASTY Right 07/19/2016   Procedure: RIGHT KNEE MEDICAL UNICOMPARTMENTAL ARTHROPLASTY;  Surgeon: Gaynelle Arabian, MD;  Location: [facility];  Service: Orthopedics;  Laterality: Right;   RADICAL ORCHIECTOMY Left 1996   benign   REMOVAL SPURS RIGHT GREAT TOE  YRS AGO   TOTAL KNEE ARTHROPLASTY Left 09-16-2007    There were no vitals filed for this visit.   Subjective Assessment - 05/17/21 1308     Subjective Patient reports he has been  a little sore. He disent really know why. He will be having surgery next week. This will likley be his last visit of theis episode.    Pertinent History trouble sleeping, heart attack 2019, diabetes type 2, L4-5: Bilateral facet arthropathy with 7 mm anterolisthesis. Disc  degeneration with annular fissures and circumferential protrusion of  the disc. Stenosis of both lateral recesses and neural foramina that  could cause neural compression on either or both sides.    L5-S1: Disc degeneration with shallow disc protrusion but no  compressive effect upon the thecal sac or S1 nerves.    Limitations Sitting;Walking;Lifting;Standing    How long can you stand comfortably? limited 5-10,    How long can  you walk comfortably? limited    Diagnostic tests fusion l4-l5 plan for patient.    Patient Stated Goals return to tennis, golf    Currently in Pain? Yes    Pain Score 4     Pain Location Back    Pain Orientation Right;Left    Pain Descriptors / Indicators Aching    Pain Type Chronic pain    Pain Onset More than a month ago    Pain Frequency Constant    Aggravating Factors  standing and walking    Pain Relieving Factors lying    Effect of Pain on Daily Activities difficulty perfroming ADL's    Multiple Pain Sites No                                        PT Education - 05/17/21 1624     Education Details HEP and symptom management    Person(s) Educated Patient    Methods Explanation;Demonstration;Tactile cues;Verbal cues    Comprehension Verbalized understanding;Returned demonstration;Verbal cues required;Tactile cues required              PT Short Term Goals - 03/21/21 1401       PT SHORT TERM GOAL #1   Title Patient will be indpendnet with basic pool program    Time 3    Period Weeks    Status On-going    Target Date 04/11/21      PT SHORT TERM GOAL #2   Title Patient will demonstrate 4+/5 gross bilateral LE strength    Time 3    Period Weeks    Status Achieved    Target Date 03/21/21      PT SHORT TERM GOAL #3   Title Patient will show improved cervical range of motion at 55 for left and right rotation.    Baseline 50/45    Time 3    Period Weeks    Status New    Target Date 04/11/21               PT Long Term Goals - 02/28/21 1506       PT LONG TERM GOAL #1   Title Patient will improve gait mechanics in order to amblate 700 feet with decreased fatigue in order to ambulate in the community.    Baseline limited walking    Time 4    Period Weeks    Status New    Target Date 03/28/21      PT LONG TERM GOAL #2   Title Patient will increase lower extremity strength in bilateral hips and knee in order to improve  transfers from sitting to standing, and standing to sitting.  Baseline 3+/5 throughout bilateral hip (except adduction 4/5), 4-/5 throughout bilateral knee    Time 4    Period Weeks    Status New    Target Date 03/28/21      PT LONG TERM GOAL #3   Title Patient will improve lumbar and cervical range of motion by 10% in order to perform ADL's safely.    Baseline limited cervical rotation 35 Lt rotation; Rt rotation 40, lumbar rotation 85%, side bending 70%.    Time 4    Period Weeks    Status New    Target Date 03/28/21      PT LONG TERM GOAL #4   Title Patient will be independent with final HEP and demonstrate understanding of correct form with exercises and stretches.    Baseline Inital HEP    Time 4    Period Weeks    Status New                   Plan - 05/17/21 1629     Clinical Impression Statement The patient will be having his surgery next week. he is going to cancel his next appointment. We will discharge him at this time. He was encoruaged to continue with his exercises until the surgery, but not to get his back exacerbated. They are memeber of the gym now. He was advised tofollow the MD's percuations for as long as he is supposed to before returning to the gym,    Personal Factors and Comorbidities Comorbidity 1;Comorbidity 2    Comorbidities myocardial infarction HCC, diabetes type 2, dysautonomia, orthostatic hypotension (patient states it has not occured in over a year)    Examination-Activity Limitations Bathing;Bend;Carry;Stairs;Stand;Transfers    Examination-Participation Restrictions Cleaning;Community Activity;Yard Work;Other    Stability/Clinical Decision Making Evolving/Moderate complexity    Clinical Decision Making Moderate    Rehab Potential Good    PT Frequency 2x / week    PT Duration 6 weeks    PT Treatment/Interventions ADLs/Self Care Home Management;Aquatic Therapy;Contrast Bath;Gait training;Stair training;Electrical  Stimulation;Cryotherapy;Functional mobility training;Therapeutic activities;Therapeutic exercise;Balance training;Traction;Moist Heat;Manual techniques;Taping;Energy conservation;Dry needling;Neuromuscular re-education;Passive range of motion;Patient/family education;Joint Manipulations    PT Next Visit Plan Aquatic therapy intro, improve gait mechanics and improve balance by increasing core strenght and LE strength, progress HEP to improve lower extremity strength in hips and knees, manuel therapy to reduce upper trap tightness; lower back tightness.    PT Home Exercise Plan 3x10 banded seated hip abdcution, 3x10 seated banded hip flexion, 3x10 seated banded knee extension; all exercises done in yellow band    Family Member Consulted wife             Patient will benefit from skilled therapeutic intervention in order to improve the following deficits and impairments:  Abnormal gait, Decreased activity tolerance, Decreased balance, Decreased coordination, Decreased range of motion, Decreased mobility, Decreased endurance, Decreased strength, Difficulty walking, Hypomobility, Impaired sensation, Pain, Impaired UE functional use, Postural dysfunction  Visit Diagnosis: Chronic bilateral low back pain without sciatica  Other abnormalities of gait and mobility  Muscle weakness (generalized)  Cervicalgia  PHYSICAL THERAPY DISCHARGE SUMMARY  Visits from Start of Care: 15  Current functional level related to goals / functional outcomes: Patient with improved strength and motion prior to surgery    Remaining deficits: Still having pain   Education / Equipment: HEP    Patient agrees to discharge. Patient goals were partially met. Patient is being discharged due to a change in medical status.    Problem List  Patient Active Problem List   Diagnosis Date Noted   History of squamous cell carcinoma 05/17/2021   Melanocytic nevi of trunk 05/17/2021   Other melanin hyperpigmentation  05/17/2021   Other seborrheic keratosis 05/17/2021   Lumbar spondylosis 03/01/2021   Orthostatic hypotension 01/30/2020   Hypertension 11/01/2018   Hyperlipidemia 61/47/0929   Acute systolic heart failure ([place]) 11/01/2018   NSTEMI (non-ST elevated myocardial infarction) ([place]) 10/29/2018   Long-term current use of opiate analgesic 05/06/2018   Chronic pain 04/26/2018   Diabetes mellitus ([place]) 04/26/2018   Degeneration of lumbar intervertebral disc 01/24/2018   Low back pain 01/11/2018   Complex sleep apnea syndrome 07/10/2017   Treatment-emergent central sleep apnea 07/10/2017   Myocardial infarction ([place]) 07/10/2017   Excessive daytime sleepiness 05/31/2017   Snoring 05/31/2017   Coronary artery disease involving native coronary artery of native heart without angina pectoris 05/31/2017   Intermittent palpitations 05/31/2017   Perspiration-excessive 05/31/2017   Disorder of autonomic nervous system 05/31/2017   OA (osteoarthritis) of knee 07/19/2016   Acute medial meniscal tear 02/10/2015    Carney Living, PT 05/18/2021, 7:47 AM  [facility] [address] [place], [place], [address] Phone: [phone]   Fax:  [phone]  Name: Blane Worthington MRN: [id number] Date of Birth: 05-05-1951

## 2021-05-19 ENCOUNTER — Other Ambulatory Visit: Payer: Self-pay

## 2021-05-19 ENCOUNTER — Encounter (HOSPITAL_COMMUNITY)
Admission: RE | Admit: 2021-05-19 | Discharge: 2021-05-19 | Disposition: A | Discharge status: Home / Self Care | Payer: Medicare Other | Source: Ambulatory Visit | Attending: Orthopedic Surgery | Admitting: Orthopedic Surgery

## 2021-05-19 ENCOUNTER — Encounter (HOSPITAL_COMMUNITY): Payer: Self-pay

## 2021-05-19 DIAGNOSIS — M5136 Other intervertebral disc degeneration, lumbar region: Secondary | ICD-10-CM | POA: Diagnosis not present

## 2021-05-19 DIAGNOSIS — Z7982 Long term (current) use of aspirin: Secondary | ICD-10-CM | POA: Diagnosis not present

## 2021-05-19 DIAGNOSIS — I252 Old myocardial infarction: Secondary | ICD-10-CM | POA: Diagnosis not present

## 2021-05-19 DIAGNOSIS — G4733 Obstructive sleep apnea (adult) (pediatric): Secondary | ICD-10-CM | POA: Diagnosis not present

## 2021-05-19 DIAGNOSIS — I110 Hypertensive heart disease with heart failure: Secondary | ICD-10-CM | POA: Diagnosis not present

## 2021-05-19 DIAGNOSIS — Z7984 Long term (current) use of oral hypoglycemic drugs: Secondary | ICD-10-CM | POA: Diagnosis not present

## 2021-05-19 DIAGNOSIS — I2510 Atherosclerotic heart disease of native coronary artery without angina pectoris: Secondary | ICD-10-CM | POA: Diagnosis not present

## 2021-05-19 DIAGNOSIS — I5022 Chronic systolic (congestive) heart failure: Secondary | ICD-10-CM | POA: Insufficient documentation

## 2021-05-19 DIAGNOSIS — Z85828 Personal history of other malignant neoplasm of skin: Secondary | ICD-10-CM | POA: Diagnosis not present

## 2021-05-19 DIAGNOSIS — M48061 Spinal stenosis, lumbar region without neurogenic claudication: Secondary | ICD-10-CM | POA: Insufficient documentation

## 2021-05-19 DIAGNOSIS — K219 Gastro-esophageal reflux disease without esophagitis: Secondary | ICD-10-CM | POA: Insufficient documentation

## 2021-05-19 DIAGNOSIS — Z01812 Encounter for preprocedural laboratory examination: Secondary | ICD-10-CM | POA: Diagnosis not present

## 2021-05-19 DIAGNOSIS — E119 Type 2 diabetes mellitus without complications: Secondary | ICD-10-CM | POA: Diagnosis not present

## 2021-05-19 DIAGNOSIS — Z79899 Other long term (current) drug therapy: Secondary | ICD-10-CM | POA: Diagnosis not present

## 2021-05-19 HISTORY — DX: Heart failure, unspecified: I50.9

## 2021-05-19 HISTORY — DX: Anemia, unspecified: D64.9

## 2021-05-19 LAB — BASIC METABOLIC PANEL
Anion gap: 9 (ref 5–15)
BUN: 16 mg/dL (ref 8–23)
CO2: 26 mmol/L (ref 22–32)
Calcium: 9.6 mg/dL (ref 8.9–10.3)
Chloride: 105 mmol/L (ref 98–111)
Creatinine, Ser: 1.15 mg/dL (ref 0.61–1.24)
GFR, Estimated: >60 mL/min (ref >60)
Glucose, Bld: 128 mg/dL — ABNORMAL HIGH (ref 70–99)
Potassium: 4 mmol/L (ref 3.5–5.1)
Sodium: 140 mmol/L (ref 135–145)

## 2021-05-19 LAB — CBC
HCT: 46 % (ref 39.0–52.0)
Hemoglobin: 15.2 g/dL (ref 13.0–17.0)
MCH: 29.6 pg (ref 26.0–34.0)
MCHC: 33 g/dL (ref 30.0–36.0)
MCV: 89.7 fL (ref 80.0–100.0)
Platelets: 271 10*3/uL (ref 150–400)
RBC: 5.13 MIL/uL (ref 4.22–5.81)
RDW: 12.4 % (ref 11.5–15.5)
WBC: 8.1 10*3/uL (ref 4.0–10.5)
nRBC: 0 % (ref 0.0–0.2)

## 2021-05-19 LAB — GLUCOSE, CAPILLARY: Glucose-Capillary: 145 mg/dL — ABNORMAL HIGH (ref 70–99)

## 2021-05-19 LAB — TYPE AND SCREEN
ABO/RH(D): O POS
Antibody Screen: NEGATIVE

## 2021-05-19 LAB — PROTIME-INR
INR: 1 (ref 0.8–1.2)
Prothrombin Time: 12.9 seconds (ref 11.4–15.2)

## 2021-05-19 LAB — APTT: aPTT: 30 seconds (ref 24–36)

## 2021-05-19 LAB — SURGICAL PCR SCREEN
MRSA, PCR: NEGATIVE
Staphylococcus aureus: NEGATIVE

## 2021-05-19 NOTE — Progress Notes (Signed)
PCP - Herbie [place] reade Cardiologist - Virl Axe  PPM/ICD - denies   Chest x-ray - 10/29/18 EKG - 03/24/21 Stress Test - 02/17/16 ECHO - 02/23/20 Cardiac Cath - 10/30/18  Sleep Study - 06/08/17   Fasting Blood Sugar - 95-130 Checks Blood Sugar once a week  As of today, STOP taking any Aspirin (unless otherwise instructed by your surgeon) Aleve, Naproxen, Ibuprofen, Motrin, Advil, Goody's, BC's, all herbal medications, fish oil, and all vitamins.  ERAS Protcol -yes   COVID TEST- 05/23/21 1:45pm   Anesthesia review: yes, per order  Patient denies shortness of breath, fever, cough and chest pain at PAT appointment   All instructions explained to the patient, with a verbal understanding of the material. Patient agrees to go over the instructions while at home for a better understanding. Patient also instructed to self quarantine after being tested for COVID-19. The opportunity to ask questions was provided.

## 2021-05-19 NOTE — Progress Notes (Signed)
Surgical Instructions    Your procedure is scheduled on 05/26/21.  Report to [facility] Main Entrance "A" at 05:30 A.M., then check in with the Admitting office.  Call this number if you have problems the morning of surgery:  [phone]   If you have any questions prior to your surgery date call [phone]: Open Monday-Friday 8am-4pm    Remember:  Do not eat after midnight the night before your surgery  You may drink clear liquids until 04:30am the morning of your surgery.   Clear liquids allowed are: Water, Non-Citrus Juices (without pulp), Carbonated Beverages, Clear Tea, Black Coffee ONLY (NO MILK, CREAM OR POWDERED CREAMER of any kind), and Gatorade    Take these medicines the morning of surgery with A SIP OF WATER  buPROPion (WELLBUTRIN XL)  carvedilol (COREG) gabapentin (NEURONTIN) HYDROCODONE-ACETAMINOPHEN PO montelukast (SINGULAIR)  IF NEEDED: Epinastine HCl 0.05 % ophthalmic solution eye drops ipratropium (ATROVENT) oxymetazoline (AFRIN) nasal spray ranitidine (ZANTAC)  valACYclovir (VALTREX)   WHAT DO I DO ABOUT MY DIABETES MEDICATION?   Do not take oral diabetes medicines (pills) the morning of surgery.      THE MORNING OF SURGERY, do not take metFORMIN (GLUCOPHAGE) or pioglitazone (ACTOS).  The day of surgery, do not take other diabetes injectables, including Byetta (exenatide), Bydureon (exenatide ER), Victoza (liraglutide), or Trulicity (dulaglutide).  If your CBG is greater than 220 mg/dL, you may take  of your sliding scale (correction) dose of insulin.   HOW TO MANAGE YOUR DIABETES BEFORE AND AFTER SURGERY  Why is it important to control my blood sugar before and after surgery? Improving blood sugar levels before and after surgery helps healing and can limit problems. A way of improving blood sugar control is eating a healthy diet by:  Eating less sugar and carbohydrates  Increasing activity/exercise  Talking with your doctor about reaching  your blood sugar goals High blood sugars (greater than 180 mg/dL) can raise your risk of infections and slow your recovery, so you will need to focus on controlling your diabetes during the weeks before surgery. Make sure that the doctor who takes care of your diabetes knows about your planned surgery including the date and location.  How do I manage my blood sugar before surgery? Check your blood sugar at least 4 times a day, starting 2 days before surgery, to make sure that the level is not too high or low.  Check your blood sugar the morning of your surgery when you wake up and every 2 hours until you get to the Short Stay unit.  If your blood sugar is less than 70 mg/dL, you will need to treat for low blood sugar: Do not take insulin. Treat a low blood sugar (less than 70 mg/dL) with  cup of clear juice (cranberry or apple), 4 glucose tablets, OR glucose gel. Recheck blood sugar in 15 minutes after treatment (to make sure it is greater than 70 mg/dL). If your blood sugar is not greater than 70 mg/dL on recheck, call [phone] for further instructions. Report your blood sugar to the short stay nurse when you get to Short Stay.  If you are admitted to the hospital after surgery: Your blood sugar will be checked by the staff and you will probably be given insulin after surgery (instead of oral diabetes medicines) to make sure you have good blood sugar levels. The goal for blood sugar control after surgery is 80-180 mg/dL.  After your COVID test   You are not required to  quarantine however you are required to wear a well-fitting mask when you are out and around people not in your household.  If your mask becomes wet or soiled, replace with a new one.  Wash your hands often with soap and water for 20 seconds or clean your hands with an alcohol-based hand sanitizer that contains at least 60% alcohol.  Do not share personal items.  Notify your provider: if you are in close contact with  someone who has COVID  or if you develop a fever of 100.4 or greater, sneezing, cough, sore throat, shortness of breath or body aches.    As of today, STOP taking any Aspirin (unless otherwise instructed by your surgeon) Aleve, Naproxen, Ibuprofen, Motrin, Advil, Goody's, BC's, all herbal medications, fish oil, and all vitamins.          Do not wear jewelry or makeup Do not wear lotions, powders, perfumes/colognes, or deodorant. Men may shave face and neck. Do not bring valuables to the hospital.  DO Not wear nail polish, gel polish, artificial nails, or any other type of covering on natural nails including finger and toenails. If patients have artificial nails, gel coating, etc. that need to be removed by a nail salon please have this removed prior to surgery or surgery may need to be canceled/delayed if the surgeon/ anesthesia feels like the patient is unable to be adequately monitored.             [facility] is not responsible for any belongings or valuables.  Do NOT Smoke (Tobacco/Vaping)  24 hours prior to your procedure If you use a CPAP at night, you may bring your mask for your overnight stay.   Contacts, glasses, dentures or bridgework may not be worn into surgery, please bring cases for these belongings   For patients admitted to the hospital, discharge time will be determined by your treatment team.   Patients discharged the day of surgery will not be allowed to drive home, and someone needs to stay with them for 24 hours.  NO VISITORS WILL BE ALLOWED IN PRE-OP WHERE PATIENTS ARE PREPPED FOR SURGERY.  ONLY 1 SUPPORT PERSON MAY BE PRESENT IN THE WAITING ROOM WHILE YOU ARE IN SURGERY.  IF YOU ARE TO BE ADMITTED, ONCE YOU ARE IN YOUR ROOM YOU WILL BE ALLOWED TWO (2) VISITORS. 1 (ONE) VISITOR MAY STAY OVERNIGHT BUT MUST ARRIVE TO THE ROOM BY 8pm.  Minor children may have two parents present. Special consideration for safety and communication needs will be reviewed on a case by case  basis.  Special instructions:    Oral Hygiene is also important to reduce your risk of infection.  Remember - BRUSH YOUR TEETH THE MORNING OF SURGERY WITH YOUR REGULAR TOOTHPASTE   [facility]- Preparing For Surgery  Before surgery, you can play an important role. Because skin is not sterile, your skin needs to be as free of germs as possible. You can reduce the number of germs on your skin by washing with CHG (chlorahexidine gluconate) Soap before surgery.  CHG is an antiseptic cleaner which kills germs and bonds with the skin to continue killing germs even after washing.     Please do not use if you have an allergy to CHG or antibacterial soaps. If your skin becomes reddened/irritated stop using the CHG.  Do not shave (including legs and underarms) for at least 48 hours prior to first CHG shower. It is OK to shave your face.  Please follow these instructions  carefully.     Shower the NIGHT BEFORE SURGERY and the MORNING OF SURGERY with CHG Soap.   If you chose to wash your hair, wash your hair first as usual with your normal shampoo. After you shampoo, rinse your hair and body thoroughly to remove the shampoo.  Then [employer] and genitals (private parts) with your normal soap and rinse thoroughly to remove soap.  After that Use CHG Soap as you would any other liquid soap. You can apply CHG directly to the skin and wash gently with a scrungie or a clean washcloth.   Apply the CHG Soap to your body ONLY FROM THE NECK DOWN.  Do not use on open wounds or open sores. Avoid contact with your eyes, ears, mouth and genitals (private parts). Wash Face and genitals (private parts)  with your normal soap.   Wash thoroughly, paying special attention to the area where your surgery will be performed.  Thoroughly rinse your body with warm water from the neck down.  DO NOT shower/wash with your normal soap after using and rinsing off the CHG Soap.  Pat yourself dry with a CLEAN TOWEL.  Wear CLEAN  PAJAMAS to bed the night before surgery  Place CLEAN SHEETS on your bed the night before your surgery  DO NOT SLEEP WITH PETS.   Day of Surgery: Take a shower with CHG soap. Wear Clean/Comfortable clothing the morning of surgery Do not apply any deodorants/lotions.   Remember to brush your teeth WITH YOUR REGULAR TOOTHPASTE.   Please read over the following fact sheets that you were given.

## 2021-05-20 ENCOUNTER — Ambulatory Visit (HOSPITAL_BASED_OUTPATIENT_CLINIC_OR_DEPARTMENT_OTHER): Payer: Medicare Other | Admitting: Physical Therapy

## 2021-05-20 NOTE — Anesthesia Preprocedure Evaluation (Addendum)
Anesthesia Evaluation  Patient identified by MRN, date of birth, ID band Patient awake    Reviewed: Allergy & Precautions, NPO status , Patient's Chart, lab work & pertinent test results, reviewed documented beta blocker date and time   History of Anesthesia Complications Negative for: history of anesthetic complications  Airway Mallampati: II  TM Distance: >3 FB Neck ROM: Full    Dental  (+) Missing,    Pulmonary sleep apnea ,    Pulmonary exam normal        Cardiovascular hypertension, Pt. on medications and Pt. on home beta blockers + CAD, + Past MI, + Cardiac Stents (2020) and +CHF  Normal cardiovascular exam  Echo 02/23/20: EF 55-60%, mild LVH, grade I DD, moderate LAE, mild MR   Neuro/Psych Anxiety Depression    GI/Hepatic Neg liver ROS, GERD  ,  Endo/Other  diabetes, Type 2, Oral Hypoglycemic Agents  Renal/GU negative Renal ROS  negative genitourinary   Musculoskeletal  (+) Arthritis , narcotic dependent  Abdominal   Peds  Hematology negative hematology ROS (+)   Anesthesia Other Findings Day of surgery medications reviewed with patient.  Reproductive/Obstetrics negative OB ROS                           Anesthesia Physical Anesthesia Plan  ASA: 3  Anesthesia Plan: General   Post-op Pain Management: GA combined w/ Regional for post-op pain   Induction: Intravenous  PONV Risk Score and Plan: 3 and Treatment may vary due to age or medical condition, Ondansetron, Dexamethasone and Propofol infusion  Airway Management Planned: Oral ETT  Additional Equipment: Arterial line  Intra-op Plan:   Post-operative Plan: Extubation in OR  Informed Consent: I have reviewed the patients History and Physical, chart, labs and discussed the procedure including the risks, benefits and alternatives for the proposed anesthesia with the patient or authorized representative who has indicated  his/her understanding and acceptance.     Dental advisory given  Plan Discussed with: CRNA  Anesthesia Plan Comments: (PAT note written 05/20/2021 by Myra Gianotti, PA-C. )      Anesthesia Quick Evaluation

## 2021-05-20 NOTE — Progress Notes (Signed)
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  History includes never smoker, HTN (and orthostatic hypotension in context of DM with autonomic neuropathy), HLD, CAD (anterior NSTEMI, s/p DES pLAD & mLAD 1/82/99), CHF (systolic HF EF 37-16% post MI 10/2018; EF 55-60% 02/2020), OSA, DM2 (with neuropathy), GERD, psoriasis, exertional dyspnea, anemia, skin cancer, (BCC, SCC), arthritis (left TKA 09/16/07; right uni-knee 07/19/16).  BMI is consistent with obesity.  Last cardiology visit with Dr. Caryl Comes on 03/24/21. He wrote, "Preoperative Assessment  Patient without symptoms of cardiorespiratory limitations; mostly limited by back.  No angina, should be at not increased risk for his back surgery.  No stable coronary artery disease.  Continue him on his atorvastatin 80.   Cardiomyopathy has normalized; continue carvedilol 6.25 Cozaar 50 and aspirin 81.  We will have him stop his Plavix.  Reviewing Dr. Antionette Char notes,' would be acceptable risk to interrupt antiplatelet therapy for surgery '". Per H&P, he will hold his ASA 7 days prior to surgery.   Presurgical COVID-19 test is scheduled for 05/23/2021. He was unable to provide a urine specimen at PAT, so will need to collect on arrival.  Anesthesia to evaluate on the day of surgery.    VS: BP (!) 157/91   Pulse 77   Temp 36.4 C (Oral)   Resp 18   Ht 5\' 11"  (1.803 m)   Wt 111.5 kg   SpO2 98%   BMI 34.28 kg/m    PROVIDERS: Maury Dus, MD is PCP - Virl Axe, MD is EP cardiologist for orthostatic hypotension in setting of DM and autonomic neuropathy. Last visit 03/24/21. See DISCUSSION. Sherren Mocha, MD is cardiologist. Last visit 05/26/20. He was considering back surgery at that time and was felt "acceptable risk to interrupt antiplatelet therapy for surgery..." and would likely discontinue Plavix at his 12 month visit. Next visit 09/26/21.   LABS: Labs reviewed: Acceptable for surgery. (all labs ordered are listed, but only abnormal results are displayed)  Labs Reviewed  GLUCOSE, CAPILLARY - Abnormal; Notable for the following components:      Result Value   Glucose-Capillary 145 (*)    All other components within normal limits  BASIC METABOLIC PANEL - Abnormal; Notable for the following components:   Glucose, Bld 128 (*)    All other components within normal limits  SURGICAL PCR SCREEN  CBC  PROTIME-INR  APTT  TYPE AND SCREEN    EKG: 03/24/21: SR. LAD. RBBB.    CV: Echo 02/23/20: IMPRESSIONS   1. Since the last study on 10/30/2018 LVEF has improved from 35-40% to  55-60%.   2. Left ventricular ejection fraction, by estimation, is 55 to 60%. The  left ventricle has normal function. The left ventricle has no regional  wall motion abnormalities. There is mild concentric left ventricular  hypertrophy. Left ventricular diastolic  parameters are consistent with Grade I diastolic dysfunction (impaired  relaxation). Elevated left atrial pressure.   3. Right ventricular systolic  function is normal. The right ventricular  size is normal.   4. Left atrial size was moderately dilated.   5. The mitral valve is normal in structure. Mild mitral valve  regurgitation. No evidence of mitral stenosis.   6. The aortic valve is normal in structure. Aortic valve regurgitation is  not visualized. Mild to moderate aortic valve sclerosis/calcification is  present, without any evidence of aortic stenosis.   7. The inferior vena cava is normal  in size with greater than 50%  respiratory variability, suggesting right atrial pressure of 3 mmHg.    Cardiac cath/PCI 10/30/18: 1st Diag lesion is 70% stenosed. Prox LAD lesion is 100% stenosed. Prox Cx lesion is 10% stenosed. Dist Cx lesion is 20% stenosed. Prox RCA to Mid RCA lesion is 10% stenosed. Post Atrio lesion is 50% stenosed. Mid LAD lesion is 95% stenosed. Post intervention, there is a 0% residual stenosis. A drug-eluting stent was successfully placed. Post intervention, there is a 0% residual stenosis. LV end diastolic pressure is mildly elevated. There is mild to moderate left ventricular systolic dysfunction. A stent was successfully placed.   Acute coronary syndrome secondary to total occlusion of the very proximal LAD immediately after a takeoff of a very proximal, near ostial diagonal vessel with initial TIMI 0 flow.   Mild concomitant CAD with 10% mid and 20% distal circumflex stenoses and 10% mid RCA stenosis with 50% distal focal narrowing in the continuation branch prior to the PLA takeoff.  There were minimal collaterals to the LAD.   Mild to moderate acute LV dysfunction with an EF of 40 to 45% and hypocontractility involving the mid distal anterolateral wall and apex.  LVEDP 20 mmHg.   Difficult but successful percutaneous coronary intervention of the very proximal LAD occlusion with ultimate insertion of a 3.0 x 38 mm Resolute Onyx DES stent at the site of very proximal total occlusion and insertion of a 2.5 x 22 mm Resolute stent in the mid LAD with the 95% stenosis being reduced to 0%.  There was initially no flow phenomena.  The patient was treated with Aggrastat infusion in addition to bivalirudin and Brilinta and received numerous doses of intracoronary nitroglycerin.   RECOMMENDATION: DAPT for minimum of 1 year...     Past Medical History:  Diagnosis Date   Acute systolic heart failure (HCC)    Anemia    Anxiety    Arthritis    Cancer ([place])     basal and squamous cell carcinoma   CHF (congestive heart failure) (HCC)    Chronic back pain    DDD (degenerative disc disease), thoracolumbar    Degenerative cervical spinal stenosis    Depression    Diabetic autonomic neuropathy (HCC)    Dyspnea    with exertion    GERD (gastroesophageal reflux disease)    Headache    History of bronchitis    History of kidney stones    Hyperlipidemia    Hypertension    Hypogonadism in male    Myocardial infarction [facility])    Neuropathy    NSTEMI (non-ST elevated myocardial infarction) ([place])    10/29/2018- PCI/DESx1 to pLAD, EF 35%   Orthostatic hypotension    OSA (obstructive sleep apnea)    per pt study done 2006 (approx)  used cpap few yrs then stopped using, stated didn't feel he needed it anymore   Psoriasis    Retinal tear    Right knee meniscal tear    Type 2 diabetes mellitus ([place])  Urinary hesitancy    Vitamin D deficiency     Past Surgical History:  Procedure Laterality Date   CATARACT EXTRACTION W/ INTRAOCULAR LENS  IMPLANT, BILATERAL  right 2015//  left 2012   CORONARY/GRAFT ACUTE MI REVASCULARIZATION N/A 10/30/2018   Procedure: Coronary/Graft Acute MI Revascularization;  Surgeon: Troy Sine, MD;  Location: [place] [facility];  Service: Cardiovascular;  Laterality: N/A;   CORRECTION REPAIR MULTIPLE  TOES , LEFT FOOT  YRS AGO   EXTRACORPOREAL SHOCK WAVE LITHOTRIPSY  2000 approx   KNEE ARTHROSCOPY Left 2007   KNEE ARTHROSCOPY Right 02/10/2015   Procedure: ARTHROSCOPY RIGHT KNEE WITH DEBRIDEMENT AND CHRONDROPLASTY;  Surgeon: Gaynelle Arabian, MD;  Location: [place];  Service: Orthopedics;  Laterality: Right;   LEFT HEART CATH AND CORONARY ANGIOGRAPHY N/A 10/30/2018   Procedure: LEFT HEART CATH AND CORONARY ANGIOGRAPHY;  Surgeon: Troy Sine, MD;  Location: [place] [facility];  Service: Cardiovascular;  Laterality: N/A;   PARTIAL KNEE ARTHROPLASTY Right 07/19/2016   Procedure: RIGHT KNEE MEDICAL  UNICOMPARTMENTAL ARTHROPLASTY;  Surgeon: Gaynelle Arabian, MD;  Location: [facility];  Service: Orthopedics;  Laterality: Right;   RADICAL ORCHIECTOMY Left 1996   benign   REMOVAL SPURS RIGHT GREAT TOE  YRS AGO   TOTAL KNEE ARTHROPLASTY Left 09-16-2007    MEDICATIONS:  acetaminophen (TYLENOL) 500 MG tablet   amphetamine-dextroamphetamine (ADDERALL XR) 30 MG 24 hr capsule   Apoaequorin 10 MG CAPS   aspirin EC 81 MG EC tablet   atorvastatin (LIPITOR) 80 MG tablet   buPROPion (WELLBUTRIN XL) 300 MG 24 hr tablet   carvedilol (COREG) 6.25 MG tablet   DULoxetine (CYMBALTA) 60 MG capsule   Epinastine HCl 0.05 % ophthalmic solution   fentaNYL (DURAGESIC) 75 MCG/HR   folic acid (FOLVITE) 1 MG tablet   furosemide (LASIX) 20 MG tablet   gabapentin (NEURONTIN) 600 MG tablet   HYDROcodone-acetaminophen (NORCO) 10-325 MG tablet   Hyoscyamine Sulfate SL 0.125 MG SUBL   ipratropium (ATROVENT) 0.03 % nasal spray   ketoconazole (NIZORAL) 2 % cream   losartan (COZAAR) 50 MG tablet   metFORMIN (GLUCOPHAGE) 1000 MG tablet   nitroGLYCERIN (NITROSTAT) 0.4 MG SL tablet   pioglitazone (ACTOS) 30 MG tablet   Testosterone 20.25 MG/ACT (4.01%) GEL   TRULICITY 1.5 UU/7.2ZD SOPN   No current facility-administered medications for this encounter.    Myra Gianotti, PA-C Surgical Short Stay/Anesthesiology [facility] Phone [phone] [facility] Phone [phone] 05/20/2021 3:08 PM

## 2021-05-23 ENCOUNTER — Other Ambulatory Visit (HOSPITAL_COMMUNITY): Payer: Medicare Other

## 2021-05-23 ENCOUNTER — Other Ambulatory Visit (HOSPITAL_COMMUNITY)
Admission: RE | Admit: 2021-05-23 | Discharge: 2021-05-23 | Disposition: A | Discharge status: Home / Self Care | Payer: Medicare Other | Source: Ambulatory Visit | Attending: Orthopedic Surgery | Admitting: Orthopedic Surgery

## 2021-05-23 DIAGNOSIS — Z01812 Encounter for preprocedural laboratory examination: Secondary | ICD-10-CM | POA: Insufficient documentation

## 2021-05-23 DIAGNOSIS — Z20822 Contact with and (suspected) exposure to covid-19: Secondary | ICD-10-CM | POA: Insufficient documentation

## 2021-05-23 LAB — SARS CORONAVIRUS 2 (TAT 6-24 HRS): SARS Coronavirus 2: NEGATIVE

## 2021-05-24 ENCOUNTER — Ambulatory Visit (HOSPITAL_BASED_OUTPATIENT_CLINIC_OR_DEPARTMENT_OTHER): Payer: Medicare Other | Admitting: Physical Therapy

## 2021-05-24 ENCOUNTER — Other Ambulatory Visit: Payer: Self-pay

## 2021-05-24 ENCOUNTER — Encounter: Payer: Self-pay | Admitting: Vascular Surgery

## 2021-05-24 ENCOUNTER — Ambulatory Visit (INDEPENDENT_AMBULATORY_CARE_PROVIDER_SITE_OTHER): Payer: Medicare Other | Admitting: Vascular Surgery

## 2021-05-24 DIAGNOSIS — M545 Low back pain, unspecified: Secondary | ICD-10-CM | POA: Diagnosis not present

## 2021-05-24 DIAGNOSIS — M549 Dorsalgia, unspecified: Secondary | ICD-10-CM | POA: Insufficient documentation

## 2021-05-24 DIAGNOSIS — G8929 Other chronic pain: Secondary | ICD-10-CM | POA: Diagnosis not present

## 2021-05-24 NOTE — Progress Notes (Signed)
Patient name: Devin Combs MRN: [id number] DOB: [birth year] Sex: male  REASON FOR CONSULT: Evaluate for L4-L5 OLIF  HPI: Devin Combs is a 70 y.o. male, with history of hypertension, hyperlipidemia, coronary artery disease status post NSTEMI with PCI and chronic lower back pain that presents for evaluation of L4-L5 OLIF.  Patient describes 35 to 40 years of chronic lower back pain.  States he has been very debilitated by this.  Overall just feels weak.  He has failed conservative management.  Dr. Rolena Infante has recommended an L4-L5 OLIF and vascular surgery was asked to assist with abdominal exposure.  He thinks he had a left inguinal hernia repair as a child.  Otherwise no abdominal surgery.  Past Medical History:  Diagnosis Date   Acute systolic heart failure (HCC)    Anemia    Anxiety    Arthritis    Cancer ([place])    basal and squamous cell carcinoma   CHF (congestive heart failure) (HCC)    Chronic back pain    DDD (degenerative disc disease), thoracolumbar    Degenerative cervical spinal stenosis    Depression    Diabetic autonomic neuropathy (HCC)    Dyspnea    with exertion    GERD (gastroesophageal reflux disease)    Headache    History of bronchitis    History of kidney stones    Hyperlipidemia    Hypertension    Hypogonadism in male    Myocardial infarction [facility])    Neuropathy    NSTEMI (non-ST elevated myocardial infarction) ([place])    10/29/2018- PCI/DESx1 to pLAD, EF 35%   Orthostatic hypotension    OSA (obstructive sleep apnea)    per pt study done 2006 (approx)  used cpap few yrs then stopped using, stated didn't feel he needed it anymore   Psoriasis    Retinal tear    Right knee meniscal tear    Type 2 diabetes mellitus ([place])    Urinary hesitancy    Vitamin D deficiency     Past Surgical History:  Procedure Laterality Date   CATARACT EXTRACTION W/ INTRAOCULAR LENS  IMPLANT, BILATERAL  right 2015//  left 2012   CORONARY/GRAFT ACUTE MI  REVASCULARIZATION N/A 10/30/2018   Procedure: Coronary/Graft Acute MI Revascularization;  Surgeon: Troy Sine, MD;  Location: [place] [facility];  Service: Cardiovascular;  Laterality: N/A;   CORRECTION REPAIR MULTIPLE  TOES , LEFT FOOT  YRS AGO   EXTRACORPOREAL SHOCK WAVE LITHOTRIPSY  2000 approx   KNEE ARTHROSCOPY Left 2007   KNEE ARTHROSCOPY Right 02/10/2015   Procedure: ARTHROSCOPY RIGHT KNEE WITH DEBRIDEMENT AND CHRONDROPLASTY;  Surgeon: Gaynelle Arabian, MD;  Location: [place];  Service: Orthopedics;  Laterality: Right;   LEFT HEART CATH AND CORONARY ANGIOGRAPHY N/A 10/30/2018   Procedure: LEFT HEART CATH AND CORONARY ANGIOGRAPHY;  Surgeon: Troy Sine, MD;  Location: [place] [facility];  Service: Cardiovascular;  Laterality: N/A;   PARTIAL KNEE ARTHROPLASTY Right 07/19/2016   Procedure: RIGHT KNEE MEDICAL UNICOMPARTMENTAL ARTHROPLASTY;  Surgeon: Gaynelle Arabian, MD;  [facility];  Service: Orthopedics;  Laterality: Right;   RADICAL ORCHIECTOMY Left 1996   benign   REMOVAL SPURS RIGHT GREAT TOE  YRS AGO   TOTAL KNEE ARTHROPLASTY Left 09-16-2007    Family History  Problem Relation Age of Onset   Neuropathy Neg Hx     SOCIAL HISTORY: Social History   Socioeconomic History   Marital status: Married    Spouse name: Not on file  Number of children: 1   Years of education: Not on file   Highest education level: Some college, no degree  Occupational History   Not on file  Tobacco Use   Smoking status: Never   Smokeless tobacco: Never  Vaping Use   Vaping Use: Never used  Substance and Sexual Activity   Alcohol use: Yes    Comment: rare   Drug use: No   Sexual activity: Not on file  Other Topics Concern   Not on file  Social History Narrative   Lives at home with wife, who is Therapist, sports.  Pt is retired.  Caffeine 2-3 cups per week. Right handed.    Social Determinants of Health   Financial Resource Strain: Not on file  Food Insecurity: Not on file   Transportation Needs: Not on file  Physical Activity: Not on file  Stress: Not on file  Social Connections: Not on file  Intimate Partner Violence: Not on file    No Known Allergies  Current Outpatient Medications  Medication Sig Dispense Refill   acetaminophen (TYLENOL) 500 MG tablet Take 1,000 mg by mouth every 6 (six) hours as needed for mild pain.     amphetamine-dextroamphetamine (ADDERALL XR) 30 MG 24 hr capsule Take 30 mg by mouth daily.     Apoaequorin 10 MG CAPS Take 10 mg by mouth daily.      aspirin EC 81 MG EC tablet Take 1 tablet (81 mg total) by mouth daily. 30 tablet 1   atorvastatin (LIPITOR) 80 MG tablet TAKE 1 TABLET BY MOUTH EVERY DAY AT 6PM (Patient taking differently: Take 80 mg by mouth every evening.) 90 tablet 0   buPROPion (WELLBUTRIN XL) 300 MG 24 hr tablet Take 300 mg by mouth daily.     carvedilol (COREG) 6.25 MG tablet TAKE 1 TABLET (6.25 MG TOTAL) BY MOUTH 2 (TWO) TIMES DAILY WITH A MEAL. 180 tablet 2   DULoxetine (CYMBALTA) 60 MG capsule Take 60 mg by mouth every evening.     Epinastine HCl 0.05 % ophthalmic solution Place 1 drop into both eyes 2 (two) times daily as needed (allergies).     fentaNYL (DURAGESIC) 75 MCG/HR Place 1 patch onto the skin every 3 (three) days.     folic acid (FOLVITE) 1 MG tablet Take 1 mg by mouth daily.     furosemide (LASIX) 20 MG tablet TAKE 1 TABLET BY MOUTH THREE TIMES A DAY AS NEEDED BETWEEN MEALS AND AT BEDTIME (Patient taking differently: Take 20 mg by mouth daily as needed for fluid or edema.) 270 tablet 0   gabapentin (NEURONTIN) 600 MG tablet Take 600 mg by mouth daily.     HYDROcodone-acetaminophen (NORCO) 10-325 MG tablet Take 1 tablet by mouth 4 (four) times daily as needed for moderate pain.     Hyoscyamine Sulfate SL 0.125 MG SUBL Place 0.125 mg under the tongue 2 (two) times daily as needed (diarrhea).     ipratropium (ATROVENT) 0.03 % nasal spray Place 2 sprays into both nostrils 3 (three) times daily as needed  for rhinitis.     ketoconazole (NIZORAL) 2 % cream Apply 1 application topically 2 (two) times daily as needed for irritation.     metFORMIN (GLUCOPHAGE) 1000 MG tablet Take 1,000 mg by mouth 2 (two) times daily with a meal.     nitroGLYCERIN (NITROSTAT) 0.4 MG SL tablet Place 1 tablet (0.4 mg total) under the tongue every 5 (five) minutes x 3 doses as needed for chest pain. 25  tablet 2   pioglitazone (ACTOS) 30 MG tablet Take 30 mg by mouth every morning.     Testosterone 20.25 MG/ACT (1.62%) GEL Apply 1 application topically at bedtime.     TRULICITY 1.5 UJ/8.1XB SOPN Inject 0.5 mLs into the skin every Saturday.      losartan (COZAAR) 50 MG tablet Take 1 tablet (50 mg total) by mouth daily. (Patient taking differently: Take 100 mg by mouth daily.) 90 tablet 3   No current facility-administered medications for this visit.    REVIEW OF SYSTEMS:  [X]  denotes positive finding, [ ]  denotes negative finding Cardiac  Comments:  Chest pain or chest pressure:    Shortness of breath upon exertion:    Short of breath when lying flat:    Irregular heart rhythm:        Vascular    Pain in calf, thigh, or hip brought on by ambulation:    Pain in feet at night that wakes you up from your sleep:     Blood clot in your veins:    Leg swelling:  x       Pulmonary    Oxygen at home:    Productive cough:     Wheezing:         Neurologic    Sudden weakness in arms or legs:     Sudden numbness in arms or legs:     Sudden onset of difficulty speaking or slurred speech:    Temporary loss of vision in one eye:     Problems with dizziness:         Gastrointestinal    Blood in stool:     Vomited blood:         Genitourinary    Burning when urinating:     Blood in urine:        Psychiatric    Major depression:         Hematologic    Bleeding problems:    Problems with blood clotting too easily:        Skin    Rashes or ulcers:        Constitutional    Fever or chills:      PHYSICAL  EXAM: Vitals:   05/24/21 0828  BP: (!) 156/92  Pulse: 79  Resp: 18  Temp: (!) 97.5 F (36.4 C)  TempSrc: Temporal  Weight: 244 lb (110.7 kg)  Height: 6' (1.829 m)    GENERAL: The patient is a well-nourished male, in no acute distress. The vital signs are documented above. CARDIAC: There is a regular rate and rhythm.  VASCULAR:  Palpable femoral pulses bilaterally No palpable pedal pulses PULMONARY: No respiratory distress. ABDOMEN: Soft and non-tender. MUSCULOSKELETAL: There are no major deformities or cyanosis. NEUROLOGIC: No focal weakness or paresthesias are detected. SKIN: There are no ulcers or rashes noted. PSYCHIATRIC: The patient has a normal affect.  DATA:   MRI reviewed and aortic bifurcation at the bottom of L4 with vein bifurcation at the top of L5.     Assessment/Plan:  70 year old male with multiple medical comorbidities that presents for preop evaluation of abdominal exposure for L4-L5 OLIF.  I have reviewed his recent MRI from 02/12/2021 as well as examining his abdomen and I think he would be a good candidate for oblique approach.  I discussed oblique incision over the left oblique muscles and then entering the retroperitoneum with him in the right lateral position to expose the L4-L5 disc space.  Discussed mobilizing the  peritoneum and left ureter as well as the left psoas muscle to expose the L4-L5 disc space from oblique approach.  Discussed may require mobilization of the vessels as well and discussed risk of injury to the above structures including vessel injury.  Look forward to assisting Dr. Rolena Infante.  Marty Heck, MD [facility] Office: [phone]

## 2021-05-26 ENCOUNTER — Inpatient Hospital Stay (HOSPITAL_COMMUNITY): Payer: Medicare Other | Admitting: Certified Registered Nurse Anesthetist

## 2021-05-26 ENCOUNTER — Encounter (HOSPITAL_COMMUNITY)
Admission: RE | Disposition: A | Discharge status: Home / Self Care | Payer: Self-pay | Source: Home / Self Care | Attending: Orthopedic Surgery

## 2021-05-26 ENCOUNTER — Encounter (HOSPITAL_COMMUNITY): Payer: Self-pay | Admitting: Orthopedic Surgery

## 2021-05-26 ENCOUNTER — Inpatient Hospital Stay (HOSPITAL_COMMUNITY)
Admission: RE | Admit: 2021-05-26 | Discharge: 2021-05-28 | DRG: 460 | Disposition: A | Discharge status: Home / Self Care | Payer: Medicare Other | Attending: Orthopedic Surgery | Admitting: Orthopedic Surgery

## 2021-05-26 ENCOUNTER — Inpatient Hospital Stay (HOSPITAL_COMMUNITY): Payer: Medicare Other

## 2021-05-26 ENCOUNTER — Other Ambulatory Visit: Payer: Self-pay

## 2021-05-26 ENCOUNTER — Inpatient Hospital Stay (HOSPITAL_COMMUNITY): Payer: Medicare Other | Admitting: Vascular Surgery

## 2021-05-26 DIAGNOSIS — Z79899 Other long term (current) drug therapy: Secondary | ICD-10-CM | POA: Diagnosis not present

## 2021-05-26 DIAGNOSIS — M4316 Spondylolisthesis, lumbar region: Secondary | ICD-10-CM | POA: Diagnosis present

## 2021-05-26 DIAGNOSIS — Z7982 Long term (current) use of aspirin: Secondary | ICD-10-CM | POA: Diagnosis not present

## 2021-05-26 DIAGNOSIS — M545 Low back pain, unspecified: Secondary | ICD-10-CM | POA: Diagnosis not present

## 2021-05-26 DIAGNOSIS — Z981 Arthrodesis status: Secondary | ICD-10-CM

## 2021-05-26 DIAGNOSIS — E1143 Type 2 diabetes mellitus with diabetic autonomic (poly)neuropathy: Secondary | ICD-10-CM | POA: Diagnosis present

## 2021-05-26 DIAGNOSIS — R5381 Other malaise: Secondary | ICD-10-CM | POA: Diagnosis present

## 2021-05-26 DIAGNOSIS — G4733 Obstructive sleep apnea (adult) (pediatric): Secondary | ICD-10-CM | POA: Diagnosis present

## 2021-05-26 DIAGNOSIS — I110 Hypertensive heart disease with heart failure: Secondary | ICD-10-CM | POA: Diagnosis present

## 2021-05-26 DIAGNOSIS — M5416 Radiculopathy, lumbar region: Secondary | ICD-10-CM | POA: Diagnosis present

## 2021-05-26 DIAGNOSIS — Z7984 Long term (current) use of oral hypoglycemic drugs: Secondary | ICD-10-CM | POA: Diagnosis not present

## 2021-05-26 DIAGNOSIS — Z85828 Personal history of other malignant neoplasm of skin: Secondary | ICD-10-CM | POA: Diagnosis not present

## 2021-05-26 DIAGNOSIS — M48062 Spinal stenosis, lumbar region with neurogenic claudication: Secondary | ICD-10-CM | POA: Diagnosis present

## 2021-05-26 DIAGNOSIS — I5022 Chronic systolic (congestive) heart failure: Secondary | ICD-10-CM | POA: Diagnosis present

## 2021-05-26 DIAGNOSIS — F32A Depression, unspecified: Secondary | ICD-10-CM | POA: Diagnosis present

## 2021-05-26 DIAGNOSIS — F1120 Opioid dependence, uncomplicated: Secondary | ICD-10-CM | POA: Diagnosis present

## 2021-05-26 DIAGNOSIS — I2510 Atherosclerotic heart disease of native coronary artery without angina pectoris: Secondary | ICD-10-CM | POA: Diagnosis present

## 2021-05-26 DIAGNOSIS — Z96653 Presence of artificial knee joint, bilateral: Secondary | ICD-10-CM | POA: Diagnosis present

## 2021-05-26 DIAGNOSIS — E785 Hyperlipidemia, unspecified: Secondary | ICD-10-CM | POA: Diagnosis present

## 2021-05-26 DIAGNOSIS — Z419 Encounter for procedure for purposes other than remedying health state, unspecified: Secondary | ICD-10-CM

## 2021-05-26 DIAGNOSIS — E1140 Type 2 diabetes mellitus with diabetic neuropathy, unspecified: Secondary | ICD-10-CM | POA: Diagnosis present

## 2021-05-26 DIAGNOSIS — G8929 Other chronic pain: Secondary | ICD-10-CM | POA: Diagnosis present

## 2021-05-26 DIAGNOSIS — Z20822 Contact with and (suspected) exposure to covid-19: Secondary | ICD-10-CM | POA: Diagnosis present

## 2021-05-26 DIAGNOSIS — K219 Gastro-esophageal reflux disease without esophagitis: Secondary | ICD-10-CM | POA: Diagnosis present

## 2021-05-26 DIAGNOSIS — I252 Old myocardial infarction: Secondary | ICD-10-CM | POA: Diagnosis not present

## 2021-05-26 DIAGNOSIS — M792 Neuralgia and neuritis, unspecified: Secondary | ICD-10-CM | POA: Diagnosis not present

## 2021-05-26 HISTORY — PX: ABDOMINAL EXPOSURE: SHX5708

## 2021-05-26 HISTORY — PX: OBLIQUE LUMBAR INTERBODY FUSION 1 LEVEL WITH PERCUTANEOUS SCREWS: SHX6890

## 2021-05-26 LAB — GLUCOSE, CAPILLARY
Glucose-Capillary: 127 mg/dL — ABNORMAL HIGH (ref 70–99)
Glucose-Capillary: 159 mg/dL — ABNORMAL HIGH (ref 70–99)
Glucose-Capillary: 207 mg/dL — ABNORMAL HIGH (ref 70–99)
Glucose-Capillary: 192 mg/dL — ABNORMAL HIGH (ref 70–99)

## 2021-05-26 LAB — HEMOGLOBIN A1C
Hgb A1c MFr Bld: 5.6 % (ref 4.8–5.6)
Mean Plasma Glucose: 114.02 mg/dL

## 2021-05-26 SURGERY — OBLIQUE LUMBAR INTERBODY FUSION 1 LEVEL WITH PERCUTANEOUS SCREWS
Anesthesia: General | Site: Abdomen | Laterality: Left

## 2021-05-26 MED ORDER — PROPOFOL 10 MG/ML IV BOLUS
INTRAVENOUS | Status: AC
  Filled 2021-05-26: qty 20

## 2021-05-26 MED ORDER — CHLORHEXIDINE GLUCONATE CLOTH 2 % EX PADS: 6.0000 | MEDICATED_PAD | Freq: Once | CUTANEOUS | Status: DC

## 2021-05-26 MED ORDER — NITROGLYCERIN 0.4 MG SL SUBL: 0.4000 mg | SUBLINGUAL_TABLET | SUBLINGUAL | Status: DC | PRN

## 2021-05-26 MED ORDER — ACETAMINOPHEN 650 MG RE SUPP: 650.0000 mg | RECTAL | Status: DC | PRN

## 2021-05-26 MED ORDER — BUPIVACAINE LIPOSOME 1.3 % IJ SUSP
INTRAMUSCULAR | Status: DC | PRN
  Administered 2021-05-26: 10 mL via PERINEURAL

## 2021-05-26 MED ORDER — LIDOCAINE 2% (20 MG/ML) 5 ML SYRINGE
INTRAMUSCULAR | Status: AC
  Filled 2021-05-26: qty 5

## 2021-05-26 MED ORDER — PROPOFOL 1000 MG/100ML IV EMUL
INTRAVENOUS | Status: AC
  Filled 2021-05-26: qty 100

## 2021-05-26 MED ORDER — PHENYLEPHRINE 40 MCG/ML (10ML) SYRINGE FOR IV PUSH (FOR BLOOD PRESSURE SUPPORT)
PREFILLED_SYRINGE | INTRAVENOUS | Status: AC
  Filled 2021-05-26: qty 10

## 2021-05-26 MED ORDER — HEMOSTATIC AGENTS (NO CHARGE) OPTIME
TOPICAL | Status: DC | PRN
  Administered 2021-05-26: 1 via TOPICAL

## 2021-05-26 MED ORDER — METFORMIN HCL 500 MG PO TABS
1000.0000 mg | ORAL_TABLET | Freq: Two times a day (BID) | ORAL | Status: DC
  Administered 2021-05-26 – 2021-05-28 (×4): 1000 mg via ORAL
  Filled 2021-05-26 (×4): qty 2

## 2021-05-26 MED ORDER — DEXMEDETOMIDINE (PRECEDEX) IN NS 20 MCG/5ML (4 MCG/ML) IV SYRINGE
PREFILLED_SYRINGE | INTRAVENOUS | Status: DC | PRN
  Administered 2021-05-26: 8 ug via INTRAVENOUS

## 2021-05-26 MED ORDER — FENTANYL CITRATE (PF) 250 MCG/5ML IJ SOLN
INTRAMUSCULAR | Status: AC
  Filled 2021-05-26: qty 5

## 2021-05-26 MED ORDER — MIDAZOLAM HCL 2 MG/2ML IJ SOLN
INTRAMUSCULAR | Status: AC
  Filled 2021-05-26: qty 2

## 2021-05-26 MED ORDER — HYDROMORPHONE HCL 1 MG/ML IJ SOLN
INTRAMUSCULAR | Status: AC
  Filled 2021-05-26: qty 1

## 2021-05-26 MED ORDER — BUPIVACAINE-EPINEPHRINE 0.25% -1:200000 IJ SOLN: INTRAMUSCULAR | Status: DC | PRN

## 2021-05-26 MED ORDER — OXYCODONE HCL 5 MG PO TABS: 5.0000 mg | ORAL_TABLET | ORAL | Status: DC | PRN

## 2021-05-26 MED ORDER — SUGAMMADEX SODIUM 200 MG/2ML IV SOLN: INTRAVENOUS | Status: DC | PRN

## 2021-05-26 MED ORDER — KETAMINE HCL 10 MG/ML IJ SOLN
INTRAMUSCULAR | Status: DC | PRN
  Administered 2021-05-26: 10 mg via INTRAVENOUS
  Administered 2021-05-26: 40 mg via INTRAVENOUS
  Administered 2021-05-26 (×3): 10 mg via INTRAVENOUS

## 2021-05-26 MED ORDER — INSULIN ASPART 100 UNIT/ML IJ SOLN: 0.0000 [IU] | Freq: Every day | INTRAMUSCULAR | Status: DC

## 2021-05-26 MED ORDER — METHOCARBAMOL 1000 MG/10ML IJ SOLN
500.0000 mg | Freq: Four times a day (QID) | INTRAVENOUS | Status: DC | PRN
  Filled 2021-05-26: qty 5

## 2021-05-26 MED ORDER — LACTATED RINGERS IV SOLN: INTRAVENOUS | Status: DC | PRN

## 2021-05-26 MED ORDER — METHOCARBAMOL 500 MG PO TABS
500.0000 mg | ORAL_TABLET | Freq: Four times a day (QID) | ORAL | Status: DC | PRN
  Administered 2021-05-26 – 2021-05-28 (×7): 500 mg via ORAL
  Filled 2021-05-26 (×7): qty 1

## 2021-05-26 MED ORDER — SODIUM CHLORIDE 0.9% FLUSH: 3.0000 mL | INTRAVENOUS | Status: DC | PRN

## 2021-05-26 MED ORDER — LIDOCAINE 2% (20 MG/ML) 5 ML SYRINGE
INTRAMUSCULAR | Status: DC | PRN
  Administered 2021-05-26: 100 mg via INTRAVENOUS

## 2021-05-26 MED ORDER — PIOGLITAZONE HCL 30 MG PO TABS
30.0000 mg | ORAL_TABLET | Freq: Every morning | ORAL | Status: DC
  Administered 2021-05-27 – 2021-05-28 (×2): 30 mg via ORAL
  Filled 2021-05-26 (×2): qty 1

## 2021-05-26 MED ORDER — LACTATED RINGERS IV SOLN: INTRAVENOUS | Status: DC

## 2021-05-26 MED ORDER — BUPROPION HCL ER (XL) 300 MG PO TB24
300.0000 mg | ORAL_TABLET | Freq: Every day | ORAL | Status: DC
  Administered 2021-05-26 – 2021-05-28 (×3): 300 mg via ORAL
  Filled 2021-05-26 (×3): qty 1

## 2021-05-26 MED ORDER — PHENYLEPHRINE HCL-NACL 20-0.9 MG/250ML-% IV SOLN
INTRAVENOUS | Status: DC | PRN
  Administered 2021-05-26: 10 ug/min via INTRAVENOUS

## 2021-05-26 MED ORDER — FENTANYL CITRATE (PF) 100 MCG/2ML IJ SOLN
INTRAMUSCULAR | Status: DC | PRN
  Administered 2021-05-26 (×2): 50 ug via INTRAVENOUS
  Administered 2021-05-26: 100 ug via INTRAVENOUS

## 2021-05-26 MED ORDER — ONDANSETRON HCL 4 MG/2ML IJ SOLN: 4.0000 mg | Freq: Four times a day (QID) | INTRAMUSCULAR | Status: DC | PRN

## 2021-05-26 MED ORDER — ATORVASTATIN CALCIUM 80 MG PO TABS
80.0000 mg | ORAL_TABLET | Freq: Every evening | ORAL | Status: DC
  Administered 2021-05-26 – 2021-05-27 (×2): 80 mg via ORAL
  Filled 2021-05-26 (×2): qty 1

## 2021-05-26 MED ORDER — SODIUM CHLORIDE 0.9% FLUSH: 3.0000 mL | Freq: Two times a day (BID) | INTRAVENOUS | Status: DC

## 2021-05-26 MED ORDER — ROCURONIUM BROMIDE 10 MG/ML (PF) SYRINGE
PREFILLED_SYRINGE | INTRAVENOUS | Status: AC
  Filled 2021-05-26: qty 10

## 2021-05-26 MED ORDER — POLYETHYLENE GLYCOL 3350 17 G PO PACK
17.0000 g | PACK | Freq: Every day | ORAL | Status: DC | PRN
  Administered 2021-05-26: 17 g via ORAL
  Filled 2021-05-26: qty 1

## 2021-05-26 MED ORDER — ONDANSETRON HCL 4 MG PO TABS: 4.0000 mg | ORAL_TABLET | Freq: Four times a day (QID) | ORAL | Status: DC | PRN

## 2021-05-26 MED ORDER — OXYCODONE HCL 5 MG PO TABS
10.0000 mg | ORAL_TABLET | ORAL | Status: DC | PRN
  Administered 2021-05-26 – 2021-05-28 (×11): 10 mg via ORAL
  Filled 2021-05-26 (×11): qty 2

## 2021-05-26 MED ORDER — BUPIVACAINE-EPINEPHRINE (PF) 0.25% -1:200000 IJ SOLN
INTRAMUSCULAR | Status: AC
  Filled 2021-05-26: qty 30

## 2021-05-26 MED ORDER — SODIUM CHLORIDE 0.9 % IV SOLN: 250.0000 mL | INTRAVENOUS | Status: DC

## 2021-05-26 MED ORDER — ONDANSETRON HCL 4 MG/2ML IJ SOLN
INTRAMUSCULAR | Status: AC
  Filled 2021-05-26: qty 2

## 2021-05-26 MED ORDER — PHENOL 1.4 % MT LIQD: 1.0000 | OROMUCOSAL | Status: DC | PRN

## 2021-05-26 MED ORDER — MENTHOL 3 MG MT LOZG: 1.0000 | LOZENGE | OROMUCOSAL | Status: DC | PRN

## 2021-05-26 MED ORDER — THROMBIN 20000 UNITS EX SOLR
CUTANEOUS | Status: AC
  Filled 2021-05-26: qty 20000

## 2021-05-26 MED ORDER — 0.9 % SODIUM CHLORIDE (POUR BTL) OPTIME
TOPICAL | Status: DC | PRN
  Administered 2021-05-26 (×3): 1000 mL

## 2021-05-26 MED ORDER — GABAPENTIN 600 MG PO TABS
600.0000 mg | ORAL_TABLET | Freq: Every day | ORAL | Status: DC
  Administered 2021-05-26 – 2021-05-28 (×3): 600 mg via ORAL
  Filled 2021-05-26 (×3): qty 1

## 2021-05-26 MED ORDER — THROMBIN 20000 UNITS EX SOLR
CUTANEOUS | Status: DC | PRN
  Administered 2021-05-26: 10 mL via TOPICAL

## 2021-05-26 MED ORDER — KETAMINE HCL 50 MG/5ML IJ SOSY
PREFILLED_SYRINGE | INTRAMUSCULAR | Status: AC
  Filled 2021-05-26: qty 10

## 2021-05-26 MED ORDER — CHLORHEXIDINE GLUCONATE 0.12 % MT SOLN
15.0000 mL | Freq: Once | OROMUCOSAL | Status: AC
  Administered 2021-05-26: 15 mL via OROMUCOSAL
  Filled 2021-05-26: qty 15

## 2021-05-26 MED ORDER — DEXAMETHASONE SODIUM PHOSPHATE 10 MG/ML IJ SOLN
INTRAMUSCULAR | Status: DC | PRN
  Administered 2021-05-26: 10 mg via INTRAVENOUS

## 2021-05-26 MED ORDER — ACETAMINOPHEN 500 MG PO TABS
1000.0000 mg | ORAL_TABLET | Freq: Once | ORAL | Status: AC
  Administered 2021-05-26: 1000 mg via ORAL
  Filled 2021-05-26: qty 2

## 2021-05-26 MED ORDER — DULOXETINE HCL 30 MG PO CPEP
60.0000 mg | ORAL_CAPSULE | Freq: Every evening | ORAL | Status: DC
  Administered 2021-05-26 – 2021-05-27 (×2): 60 mg via ORAL
  Filled 2021-05-26 (×2): qty 2

## 2021-05-26 MED ORDER — DEXAMETHASONE SODIUM PHOSPHATE 10 MG/ML IJ SOLN
INTRAMUSCULAR | Status: AC
  Filled 2021-05-26: qty 1

## 2021-05-26 MED ORDER — FLEET ENEMA 7-19 GM/118ML RE ENEM: 1.0000 | ENEMA | Freq: Once | RECTAL | Status: DC | PRN

## 2021-05-26 MED ORDER — HYDROMORPHONE HCL 1 MG/ML IJ SOLN
0.2500 mg | INTRAMUSCULAR | Status: DC | PRN
  Administered 2021-05-26 (×4): 0.5 mg via INTRAVENOUS

## 2021-05-26 MED ORDER — SUGAMMADEX SODIUM 200 MG/2ML IV SOLN
INTRAVENOUS | Status: DC | PRN
  Administered 2021-05-26: 200 mg via INTRAVENOUS

## 2021-05-26 MED ORDER — ORAL CARE MOUTH RINSE: 15.0000 mL | Freq: Once | OROMUCOSAL | Status: AC

## 2021-05-26 MED ORDER — ROCURONIUM BROMIDE 10 MG/ML (PF) SYRINGE
PREFILLED_SYRINGE | INTRAVENOUS | Status: DC | PRN
  Administered 2021-05-26: 60 mg via INTRAVENOUS
  Administered 2021-05-26: 20 mg via INTRAVENOUS

## 2021-05-26 MED ORDER — PHENYLEPHRINE HCL (PRESSORS) 10 MG/ML IV SOLN
INTRAVENOUS | Status: DC | PRN
  Administered 2021-05-26: 80 ug via INTRAVENOUS

## 2021-05-26 MED ORDER — BUPIVACAINE-EPINEPHRINE (PF) 0.5% -1:200000 IJ SOLN
INTRAMUSCULAR | Status: DC | PRN
  Administered 2021-05-26: 20 mL via PERINEURAL

## 2021-05-26 MED ORDER — MIDAZOLAM HCL 5 MG/5ML IJ SOLN
INTRAMUSCULAR | Status: DC | PRN
  Administered 2021-05-26 (×2): 1 mg via INTRAVENOUS

## 2021-05-26 MED ORDER — HYDROMORPHONE HCL 1 MG/ML IJ SOLN: 1.0000 mg | INTRAMUSCULAR | Status: DC | PRN

## 2021-05-26 MED ORDER — HEPARIN 6000 UNIT IRRIGATION SOLUTION
Status: AC
  Filled 2021-05-26: qty 500

## 2021-05-26 MED ORDER — ACETAMINOPHEN 325 MG PO TABS
650.0000 mg | ORAL_TABLET | ORAL | Status: DC | PRN
  Administered 2021-05-26 – 2021-05-28 (×4): 650 mg via ORAL
  Filled 2021-05-26 (×4): qty 2

## 2021-05-26 MED ORDER — LOSARTAN POTASSIUM 50 MG PO TABS
100.0000 mg | ORAL_TABLET | Freq: Every day | ORAL | Status: DC
  Administered 2021-05-26 – 2021-05-28 (×3): 100 mg via ORAL
  Filled 2021-05-26 (×3): qty 2

## 2021-05-26 MED ORDER — CEFAZOLIN SODIUM-DEXTROSE 2-4 GM/100ML-% IV SOLN
2.0000 g | INTRAVENOUS | Status: AC
  Administered 2021-05-26: 2 g via INTRAVENOUS
  Filled 2021-05-26: qty 100

## 2021-05-26 MED ORDER — CARVEDILOL 6.25 MG PO TABS
6.2500 mg | ORAL_TABLET | Freq: Two times a day (BID) | ORAL | Status: DC
  Administered 2021-05-26 – 2021-05-28 (×4): 6.25 mg via ORAL
  Filled 2021-05-26 (×4): qty 1

## 2021-05-26 MED ORDER — CEFAZOLIN SODIUM-DEXTROSE 1-4 GM/50ML-% IV SOLN
1.0000 g | Freq: Three times a day (TID) | INTRAVENOUS | Status: AC
  Administered 2021-05-26 (×2): 1 g via INTRAVENOUS
  Filled 2021-05-26 (×2): qty 50

## 2021-05-26 MED ORDER — PROPOFOL 10 MG/ML IV BOLUS
INTRAVENOUS | Status: DC | PRN
  Administered 2021-05-26: 180 mg via INTRAVENOUS

## 2021-05-26 MED ORDER — PROPOFOL 500 MG/50ML IV EMUL
INTRAVENOUS | Status: DC | PRN
  Administered 2021-05-26: 100 ug/kg/min via INTRAVENOUS

## 2021-05-26 MED ORDER — ONDANSETRON HCL 4 MG/2ML IJ SOLN
INTRAMUSCULAR | Status: DC | PRN
Start: 2021-05-26 — End: 2021-05-26
  Administered 2021-05-26: 4 mg via INTRAVENOUS

## 2021-05-26 MED ORDER — INSULIN ASPART 100 UNIT/ML IJ SOLN
0.0000 [IU] | Freq: Three times a day (TID) | INTRAMUSCULAR | Status: DC
  Administered 2021-05-26: 5 [IU] via SUBCUTANEOUS
  Administered 2021-05-27 (×3): 2 [IU] via SUBCUTANEOUS

## 2021-05-26 SURGICAL SUPPLY — 98 items
APPLIER CLIP 11 MED OPEN (CLIP) ×2
BAG COUNTER SPONGE SURGICOUNT (BAG) ×4 IMPLANT
BLADE CLIPPER SURG (BLADE) ×2 IMPLANT
BLADE SURG 10 STRL SS (BLADE) ×4 IMPLANT
CABLE BIPOLOR RESECTION CORD (MISCELLANEOUS) ×2 IMPLANT
CATH FOLEY 2WAY SLVR  5CC 16FR (CATHETERS)
CATH FOLEY 2WAY SLVR 5CC 16FR (CATHETERS) IMPLANT
CLIP APPLIE 11 MED OPEN (CLIP) ×1 IMPLANT
CLIP LIGATING EXTRA MED SLVR (CLIP) IMPLANT
CLIP NEUROVISION LG (CLIP) ×2 IMPLANT
COVER BACK TABLE 80X110 HD (DRAPES) ×2 IMPLANT
COVER SURGICAL LIGHT HANDLE (MISCELLANEOUS) ×2 IMPLANT
DISSECTOR BLUNT TIP ENDO 5MM (MISCELLANEOUS) ×2 IMPLANT
DRAIN CHANNEL 15F RND FF W/TCR (WOUND CARE) IMPLANT
DRAPE C-ARM 42X72 X-RAY (DRAPES) ×2 IMPLANT
DRAPE C-ARMOR (DRAPES) ×2 IMPLANT
DRAPE INCISE IOBAN 66X45 STRL (DRAPES) IMPLANT
DRSG OPSITE POSTOP 3X4 (GAUZE/BANDAGES/DRESSINGS) IMPLANT
DRSG OPSITE POSTOP 4X10 (GAUZE/BANDAGES/DRESSINGS) IMPLANT
DRSG OPSITE POSTOP 4X6 (GAUZE/BANDAGES/DRESSINGS) ×6 IMPLANT
DRSG OPSITE POSTOP 4X8 (GAUZE/BANDAGES/DRESSINGS) ×2 IMPLANT
DURAPREP 26ML APPLICATOR (WOUND CARE) ×2 IMPLANT
ELECT BLADE 4.0 EZ CLEAN MEGAD (MISCELLANEOUS) ×2
ELECT CAUTERY BLADE 6.4 (BLADE) ×2 IMPLANT
ELECT PENCIL ROCKER SW 15FT (MISCELLANEOUS) ×2 IMPLANT
ELECT REM PT RETURN 9FT ADLT (ELECTROSURGICAL) ×2
ELECTRODE BLDE 4.0 EZ CLN MEGD (MISCELLANEOUS) ×1 IMPLANT
ELECTRODE REM PT RTRN 9FT ADLT (ELECTROSURGICAL) ×1 IMPLANT
GAUZE 4X4 16PLY ~~LOC~~+RFID DBL (SPONGE) ×2 IMPLANT
GLOVE SRG 8 PF TXTR STRL LF DI (GLOVE) ×1 IMPLANT
GLOVE SURG ENC MOIS LTX SZ6.5 (GLOVE) ×2 IMPLANT
GLOVE SURG ENC MOIS LTX SZ7.5 (GLOVE) ×2 IMPLANT
GLOVE SURG MICRO LTX SZ7.5 (GLOVE) ×2 IMPLANT
GLOVE SURG MICRO LTX SZ8.5 (GLOVE) ×6 IMPLANT
GLOVE SURG POLYISO LF SZ7 (GLOVE) ×2 IMPLANT
GLOVE SURG UNDER POLY LF SZ6.5 (GLOVE) ×2 IMPLANT
GLOVE SURG UNDER POLY LF SZ7 (GLOVE) ×2 IMPLANT
GLOVE SURG UNDER POLY LF SZ8 (GLOVE) ×2
GLOVE SURG UNDER POLY LF SZ8.5 (GLOVE) ×6 IMPLANT
GOWN STRL REUS W/ TWL LRG LVL3 (GOWN DISPOSABLE) ×3 IMPLANT
GOWN STRL REUS W/ TWL XL LVL3 (GOWN DISPOSABLE) ×2 IMPLANT
GOWN STRL REUS W/TWL 2XL LVL3 (GOWN DISPOSABLE) ×4 IMPLANT
GOWN STRL REUS W/TWL LRG LVL3 (GOWN DISPOSABLE) ×6
GOWN STRL REUS W/TWL XL LVL3 (GOWN DISPOSABLE) ×4
GRAFT TRINITY ELITE LGE HUMAN (Tissue) ×2 IMPLANT
GUIDEWIRE NITINOL BEVEL TIP (WIRE) ×8 IMPLANT
INSERT FOGARTY 61MM (MISCELLANEOUS) IMPLANT
INSERT FOGARTY SM (MISCELLANEOUS) IMPLANT
KIT BASIN OR (CUSTOM PROCEDURE TRAY) ×2 IMPLANT
KIT TURNOVER KIT B (KITS) ×2 IMPLANT
MODULE EMG NEEDLE SSEP NVM5 (NEEDLE) ×2 IMPLANT
MODULE NVM5 NEXT GEN EMG (NEEDLE) ×2 IMPLANT
NEEDLE 22X1 1/2 (OR ONLY) (NEEDLE) ×2 IMPLANT
NEEDLE SPNL 18GX3.5 QUINCKE PK (NEEDLE) ×2 IMPLANT
NS IRRIG 1000ML POUR BTL (IV SOLUTION) ×2 IMPLANT
PACK LAMINECTOMY ORTHO (CUSTOM PROCEDURE TRAY) ×2 IMPLANT
PACK UNIVERSAL I (CUSTOM PROCEDURE TRAY) ×2 IMPLANT
PAD ARMBOARD 7.5X6 YLW CONV (MISCELLANEOUS) ×10 IMPLANT
PROBE BALL TIP NVM5 SNG USE (BALLOONS) ×2 IMPLANT
ROD RELINE MAS LORD 5.5X45MM (Rod) ×2 IMPLANT
ROD RELINE MAS TI LORD 5.5X40 (Rod) ×2 IMPLANT
SCREW LOCK RELINE 5.5 TULIP (Screw) ×8 IMPLANT
SCREW RELINE RED 6.5X45MM POLY (Screw) ×8 IMPLANT
SHIM WIDENING (MISCELLANEOUS) ×4 IMPLANT
SPACER RISEL 22X55 7-14MM (Spacer) ×2 IMPLANT
SPONGE INTESTINAL PEANUT (DISPOSABLE) ×2 IMPLANT
SPONGE SURGIFOAM ABS GEL 100 (HEMOSTASIS) ×2 IMPLANT
SPONGE T-LAP 18X18 ~~LOC~~+RFID (SPONGE) ×2 IMPLANT
SPONGE T-LAP 4X18 ~~LOC~~+RFID (SPONGE) ×4 IMPLANT
STAPLER VISISTAT 35W (STAPLE) ×2 IMPLANT
STRIP CLOSURE SKIN 1/2X4 (GAUZE/BANDAGES/DRESSINGS) ×6 IMPLANT
SURGIFLO W/THROMBIN 8M KIT (HEMOSTASIS) ×2 IMPLANT
SUT BONE WAX W31G (SUTURE) ×2 IMPLANT
SUT MNCRL AB 3-0 PS2 27 (SUTURE) ×4 IMPLANT
SUT PDS AB 1 CTX 36 (SUTURE) ×2 IMPLANT
SUT PROLENE 4 0 RB 1 (SUTURE)
SUT PROLENE 4-0 RB1 .5 CRCL 36 (SUTURE) IMPLANT
SUT PROLENE 5 0 C 1 24 (SUTURE) IMPLANT
SUT PROLENE 5 0 CC1 (SUTURE) IMPLANT
SUT PROLENE 6 0 C 1 30 (SUTURE) IMPLANT
SUT PROLENE 6 0 CC (SUTURE) IMPLANT
SUT SILK 0 TIES 10X30 (SUTURE) ×2 IMPLANT
SUT SILK 2 0 TIES 10X30 (SUTURE) ×4 IMPLANT
SUT SILK 2 0SH CR/8 30 (SUTURE) IMPLANT
SUT SILK 3 0 TIES 10X30 (SUTURE) ×2 IMPLANT
SUT SILK 3 0 TIES 17X18 (SUTURE) ×2
SUT SILK 3 0SH CR/8 30 (SUTURE) IMPLANT
SUT SILK 3-0 18XBRD TIE BLK (SUTURE) ×1 IMPLANT
SUT VIC AB 1 CT1 18XCR BRD 8 (SUTURE) ×2 IMPLANT
SUT VIC AB 1 CT1 8-18 (SUTURE) ×4
SUT VIC AB 2-0 CT1 18 (SUTURE) ×4 IMPLANT
SYR BULB IRRIG 60ML STRL (SYRINGE) ×2 IMPLANT
SYSTEM MIS ILLUMINATION (SYSTAGENIX WOUND MANAGEMENT) ×2 IMPLANT
TAPE CLOTH 4X10 WHT NS (GAUZE/BANDAGES/DRESSINGS) ×4 IMPLANT
TOWEL GREEN STERILE (TOWEL DISPOSABLE) ×2 IMPLANT
TUBE CONNECTING 20X1/4 (TUBING) ×2 IMPLANT
WATER STERILE IRR 1000ML POUR (IV SOLUTION) IMPLANT
YANKAUER SUCT BULB TIP NO VENT (SUCTIONS) ×4 IMPLANT

## 2021-05-26 NOTE — OR Nursing (Addendum)
At 1026 Dr. Kerby Moors, Radiologist called to confirm the abdominal cavity was clear of any instrumentation and/or supplies.   Dr. Rolena Infante made aware.

## 2021-05-26 NOTE — Anesthesia Procedure Notes (Signed)
Anesthesia Regional Block: TAP block   Pre-Anesthetic Checklist: , timeout performed,  Correct Patient, Correct Site, Correct Laterality,  Correct Procedure, Correct Position, site marked,  Risks and benefits discussed,  Pre-op evaluation,  At surgeon's request and post-op pain management  Laterality: Left  Prep: Maximum Sterile Barrier Precautions used, chloraprep       Needles:  Injection technique: Single-shot  Needle Type: Echogenic Stimulator Needle     Needle Length: 9cm  Needle Gauge: 21     Additional Needles:   Narrative:  Start time: 05/26/2021 7:12 AM End time: 05/26/2021 7:15 AM Injection made incrementally with aspirations every 5 mL. Anesthesiologist: Brennan Bailey, MD  Additional Notes: Risks, benefits, and alternative discussed. Patient gave consent for procedure. Patient prepped and draped in sterile fashion. Sedation administered, patient remains easily responsive to voice. Relevant anatomy identified with ultrasound guidance. Local anesthetic given in 5cc increments with no signs or symptoms of intravascular injection. No pain or paraesthesias with injection. Patient monitored throughout procedure with signs of LAST or immediate complications. Tolerated well. Ultrasound image placed in chart. Tawny Asal, MD

## 2021-05-26 NOTE — Op Note (Addendum)
Date: May 26, 2021  Preoperative diagnosis: Chronic lower back pain with neurogenic leg pain  Postoperative diagnosis: Same  Procedure: Abdominal exposure at the L4-L5 disc space via oblique retroperitoneal approach for L4-L5 OLIF  Surgeon: Dr. Marty Heck, MD  Co-surgeon: Dr. Melina Schools, MD  Indications: Patient is a 70 year old male with chronic lower back pain with neurogenic leg pain.  He has failed conservative management and Dr. Rolena Infante has recommended an L4-L5 OLIF.  Vascular surgery was asked to assist with oblique abdominal exposure and he presents today after risk benefits discussed.  Findings: Patient was placed in the right lateral position and all pressure points padded.  The L4-5 disc space was marked over the left oblique muscles.  Incision was made and I then entered the retroperitoneum with the patient in the right lateral position with a muscle-sparing technique through the oblique muscles and the retroperitoneum and left ureter was swept down exposing the left psoas.  Ultimately the left psoas was mobilized lateral to the disc space and the vessels were mobilized medial to the disc space exposing the L4-L5 disc space.  Fixed tractors were placed.  We confirmed we were at the correct level with a spinal needle in the disc space  Anesthesia: General  Details: Patient was taken to the operating room after informed consent was obtained.  Placed on operating table in the supine position and general endotracheal anesthesia was induced.  Patient was then placed in the right lateral position and all pressure points padded.  We brought a fluoroscopic C arm in the lateral position and the L4-5 disc space was marked over the left oblique muscles with the patient in the right lateral position.  The abdomen and back were then prepped and draped in standard sterile fashion.  Timeout was performed.  Antibiotics were given.  Initially made an oblique incision over the left oblique  muscles 2 fingerbreadths off the iliac crest where we had marked the level of the disc space.  Dissected down with Bovie cautery through the subcutaneous tissue and opened the external oblique fascia.  Ultimately I performed a muscle-sparing technique between the layers of oblique muscles until I entered the retroperitoneum.  The peritoneum and left ureter were then swept down to expose the L4-L5 disc space from an oblique approach.  Dr. Rolena Infante used hand-held Wiley retractors to help expose and I used laparoscopic Kitner to mobilize the left psoas lateral to the disc space while keeping the vessels medial.  Ultimately I mobilized enough working room at the disc space and then I was able to place fixed retractors with a 160 blade inferior keeping the vessels out of the surgical field with a 160 and 180 blade lateral to the disc base pulling the psoas away and making an oblique exposure of L4-L5.  Satisfied with exposure, I placed a needle in the disc space.  We confirmed with lateral fluoroscopy we were at the correct L4-L5 level.  Case was turned over to Dr. Rolena Infante.  Please see his dictation for the remainder of the case.  Complication: None  Condition: Stable  Marty Heck, MD [facility] Office: [place]

## 2021-05-26 NOTE — Anesthesia Procedure Notes (Addendum)
Procedure Name: Intubation Date/Time: 05/26/2021 7:56 AM Performed by: Vonna Drafts, CRNA Pre-anesthesia Checklist: Patient identified, Emergency Drugs available, Suction available and Patient being monitored Patient Re-evaluated:Patient Re-evaluated prior to induction Oxygen Delivery Method: Circle system utilized Preoxygenation: Pre-oxygenation with 100% oxygen Induction Type: IV induction Ventilation: Two handed mask ventilation required and Oral airway inserted - appropriate to patient size Laryngoscope Size: Mac and 4 Grade View: Grade I Tube type: Oral Tube size: 7.5 mm Number of attempts: 1 Airway Equipment and Method: Stylet and Oral airway Placement Confirmation: ETT inserted through vocal cords under direct vision, positive ETCO2 and breath sounds checked- equal and bilateral Secured at: 23 cm Tube secured with: Tape Dental Injury: Teeth and Oropharynx as per pre-operative assessment

## 2021-05-26 NOTE — Op Note (Signed)
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 Monica Martinez  ANESTHESIA:   General  EBL: 188 ml   Complications: None  Implants: Globus expandable lateral interbody cage (rise-L): 7 to 14 mm 0 degree lordosis.  55 mm length.  Cage expanded to approximately 13 mm.  NuVasive MIS pedicle screws.  6.5 x 45 mm length.  Graft: Trinity allograft  Neuro monitoring: No adverse activity with EMG and SSEP monitoring.  All 4 pedicle screws were directly stimulated and there was no adverse activity at greater than 40 mA.  BRIEF HISTORY: Devin Combs is a 70 y.o. male who has had significant back buttock and neuropathic leg pain for some time now.  Despite appropriate conservative management his quality of life is failed to improve.  As result of the degenerative spondylolisthesis and subsequent spinal stenosis with neurogenic claudication he elected to move forward with surgery.  All appropriate risks, benefits, and alternatives to surgery were discussed and consent was obtained.   PROCEDURE DETAILS: Patient was brought into the operating room and was properly positioned on the operating room table.  After induction with general anesthesia the patient was endotracheally intubated.  A timeout was taken to confirm all important data: including patient, procedure, and the level. Teds, SCD's were applied.   Patient was placed into the lateral decubitus position and all bony prominences well-padded.  Axillary roll was placed.  He was secured directly to the table with tape in order to  facilitate the oblique approach.  With the patient secured directly to the table I confirmed with imaging that we had satisfactory visualization of the L4-5 disc space.  The patient was prepped and draped in a standard fashion.  At this point Dr. Carlis Abbott performed a standard oblique retroperitoneal approach to the lumbar spine please refer to his dictation for specifics.  Once the approach was complete Dr. Carlis Abbott scrubbed out and I continued on with the case management  A needle was placed into the L4-5 disc space and an x-ray was taken confirming that we are at the appropriate level.  Annulotomy was performed and I remove the bulk of the disc material.  Using a Cobb elevator I stripped the 70 disc from the endplate of L4 and L5 and then I used a box osteotome to remove the bulk of the disc material.  I then used pituitary rongeurs, curettes, and Kerrison rongeurs to remove all of the disc material.  I ultimately was able to use a fine curved curette to get behind the body of L4 and released the annulus.  Once I had an adequate discectomy and I remove the cartilaginous endplate I used the trialing device to measure and to ensure that the contralateral annulus had been released in order to maximize graft positioning.  With the discectomy complete I then obtained the expandable implant  and packed the expandable cage with the allograft bone and gently inserted the cage to the appropriate resting place.  I confirmed with fluoroscopy satisfactory position and then expanded the cage and had excellent contact with the endplate.  I then removed the inserting device and then backfilled the cage with additional bone graft as well as  placed bone graft anterior to the cage.  At this point I copiously irrigated the wound and confirmed hemostasis with bipolar cautery.  I remove the retractors, and closed the fascia of the external oblique with a running #1 PDS suture.  The I then took a AP x-ray was ultimately read by the  radiologist confirming there were no retained surgical instruments in the wound.  Once this was cleared I then continued the closure with a layer of 2-0 Vicryl suture, and a 3-0 Monocryl for the skin.  Now that the oblique lumbar interbody fusion was complete I then went to the posterior side and marked out the lateral aspect of the L4 and L5 pedicles bilaterally.  Small incision was made and I advanced the Jamshidi needle through the incision down to the lateral aspect of the L4 pedicle.  I confirmed satisfactory position with fluoroscopy.  The Jamshidi needle was advanced using fluoroscopy as well as direct monitoring.  On the AP view as the Jamshidi needle tip neared the medial wall of the pedicle I confirmed on the lateral view that I was just beyond the posterior wall of the vertebral body.  Once I confirmed position and trajectory advanced into the vertebral body.  A guidepin was placed to cannulate the pedicle.  A repeated this exact same technique on the contralateral side and at L5.  At this point all 4 pedicles were cannulated.  I then measured and obtained the 6.5 x 45 mm pedicle screw.  This was placed over the guidepin and advanced over the guidepin into the pedicle.  I again stimulated the screw during insertion once all 4 pedicle screws were properly positioned the guide pins were removed and then I directly stimulated the screw itself.  There were no adverse activity at greater than 40 mA at any of the screws.  All 4 screws had excellent purchase.  I then measured and obtained the appropriate size rod.  The rod was passed and secured to the polyaxial head with the locking caps.  All 4 locking caps were then tightened/torqued according to manufacture standards the insertion tabs were broken off and removed.  Final intraoperative fluoroscopy views were taken confirming satisfactory position of the intervertebral cage as well as the 4 pedicle screws and rods.  The posterior wounds were irrigated  copiously normal saline and closed in a layered fashion with interrupted #1 Vicryl suture, 2-0 Vicryl suture, and a 3-0 Monocryl.  Steri-Strips and dry dressings were applied on all 3 wounds.  The patient was ultimately extubated and transferred to the PACU without incident.  The end of the case all needle sponge counts were correct.  There were no adverse intraoperative events.  Melina Schools, MD 05/26/2021 12:14 PM

## 2021-05-26 NOTE — H&P (Signed)
Addendum H&P: There is been no change in the patient's clinical exam since his last office note of 04/16/2022.  He continues to have severe back buttock and neuropathic leg pain.  Despite appropriate conservative management his quality of life is continued to deteriorate.  As result of the failure of conservative modalities we elected to move forward with an oblique lumbar interbody fusion L4-5 with supplemental posterior fixation.  All appropriate risks, benefits, and alternatives to surgery were discussed with the patient and consent was obtained.

## 2021-05-26 NOTE — Anesthesia Procedure Notes (Signed)
Arterial Line Insertion Start/End10/13/2022 7:10 AM, 05/26/2021 7:17 AM Performed by: Gaylene Brooks, CRNA, CRNA  Preanesthetic checklist: patient identified, IV checked, site marked, risks and benefits discussed, surgical consent, monitors and equipment checked, pre-op evaluation, timeout performed and anesthesia consent Lidocaine 1% used for infiltration Right, radial was placed Catheter size: 20 G Hand hygiene performed  and maximum sterile barriers used   Attempts: 2 Procedure performed without using ultrasound guided technique. Following insertion, dressing applied and Biopatch. Post procedure assessment: normal  Post procedure complications: local hematoma. Patient tolerated the procedure well with no immediate complications. Additional procedure comments: 1 attempt by S. Lovena Le CRNA.Marland Kitchen

## 2021-05-26 NOTE — Brief Op Note (Signed)
05/26/2021  12:28 PM  PATIENT:  Devin Combs  70 y.o. 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  PROCEDURE:  Procedure(s) with comments: OBLIQUE LUMBAR INTERBODY FUSION 1 LEVEL WITH PERCUTANEOUS SCREWS (OLIF L4-5 WITH POSTERIOR SPINAL FUSION INTERBODY) (Left) - LEFT TAP BLOCK WITH EXPAREL ABDOMINAL EXPOSURE (Left)  SURGEON:  Surgeon(s) and Role: Panel 1:    Melina Schools, MD - Primary Panel 2:    Marty Heck, MD - Primary  PHYSICIAN ASSISTANT:   ASSISTANTS: none   ANESTHESIA:   general  EBL:  300 mL   BLOOD ADMINISTERED:none  DRAINS: none   LOCAL MEDICATIONS USED:  NONE  SPECIMEN:  No Specimen  DISPOSITION OF SPECIMEN:  N/A  COUNTS:  YES  TOURNIQUET:  * No tourniquets in log *  DICTATION: .Dragon Dictation  PLAN OF CARE: Admit to inpatient   PATIENT DISPOSITION:  PACU - hemodynamically stable.

## 2021-05-26 NOTE — H&P (Signed)
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  The various methods of treatment have been discussed with the patient and family. After consideration of risks, benefits and other options for treatment, the patient has consented to  Procedure(s) with comments: OBLIQUE LUMBAR INTERBODY FUSION 1 LEVEL WITH PERCUTANEOUS SCREWS (OLIF L4-5 WITH POSTERIOR SPINAL FUSION INTERBODY) (N/A) - LEFT TAP BLOCK WITH EXPAREL ABDOMINAL EXPOSURE (N/A) as a surgical intervention.  The patient's history has been reviewed, patient examined, no change in status, stable for surgery.  I have reviewed the patient's chart and labs.  Questions were answered to the patient's satisfaction.    L4-L5 OLIF  Marty Heck  Patient name: Devin Combs   MRN: [id number]        DOB: 1950-10-11          Sex: male   REASON FOR CONSULT: Evaluate for L4-L5 OLIF   HPI: Devin Combs is a 70 y.o. male, with history of hypertension, hyperlipidemia, coronary artery disease status post NSTEMI with PCI and chronic lower back pain that presents for evaluation of L4-L5 OLIF.  Patient describes 35 to 40 years of chronic lower back pain.  States he has been very debilitated by this.  Overall just feels weak.  He has failed conservative management.  Dr. Rolena Infante has recommended an L4-L5 OLIF and vascular surgery was asked to assist with abdominal exposure.  He thinks he had a left inguinal hernia repair as a child.  Otherwise no abdominal surgery.       Past Medical History:  Diagnosis Date   Acute systolic heart failure (HCC)     Anemia     Anxiety     Arthritis     Cancer ([place])      basal and squamous cell carcinoma   CHF (congestive heart failure) (HCC)     Chronic back pain     DDD (degenerative disc disease), thoracolumbar     Degenerative cervical spinal stenosis      Depression     Diabetic autonomic neuropathy (HCC)     Dyspnea      with exertion    GERD (gastroesophageal reflux disease)     Headache     History of bronchitis     History of kidney stones     Hyperlipidemia     Hypertension     Hypogonadism in male     Myocardial infarction [facility])     Neuropathy     NSTEMI (non-ST elevated myocardial infarction) ([place])      10/29/2018- PCI/DESx1 to pLAD, EF 35%   Orthostatic hypotension     OSA (obstructive sleep apnea)      per pt study done 2006 (approx)  used cpap few yrs then stopped using, stated didn't feel he needed it anymore   Psoriasis     Retinal tear     Right knee meniscal tear     Type 2 diabetes mellitus ([place])     Urinary hesitancy     Vitamin D deficiency             Past Surgical History:  Procedure Laterality Date   CATARACT EXTRACTION W/ INTRAOCULAR LENS  IMPLANT, BILATERAL   right 2015//  left 2012   CORONARY/GRAFT ACUTE MI REVASCULARIZATION N/A 10/30/2018    Procedure: Coronary/Graft Acute MI Revascularization;  Surgeon: Troy Sine, MD;  Location: [place]  [facility];  Service: Cardiovascular;  Laterality: N/A;   CORRECTION REPAIR MULTIPLE  TOES , LEFT FOOT   YRS AGO   EXTRACORPOREAL SHOCK WAVE LITHOTRIPSY   2000 approx   KNEE ARTHROSCOPY Left 2007   KNEE ARTHROSCOPY Right 02/10/2015    Procedure: ARTHROSCOPY RIGHT KNEE WITH DEBRIDEMENT AND CHRONDROPLASTY;  Surgeon: Gaynelle Arabian, MD;  Location: [place];  Service: Orthopedics;  Laterality: Right;   LEFT HEART CATH AND CORONARY ANGIOGRAPHY N/A 10/30/2018    Procedure: LEFT HEART CATH AND CORONARY ANGIOGRAPHY;  Surgeon: Troy Sine, MD;  Location: [place] [facility];  Service: Cardiovascular;  Laterality: N/A;   PARTIAL KNEE ARTHROPLASTY Right 07/19/2016    Procedure: RIGHT KNEE MEDICAL UNICOMPARTMENTAL ARTHROPLASTY;  Surgeon: Gaynelle Arabian, MD;  Location: [facility];  Service: Orthopedics;  Laterality: Right;   RADICAL ORCHIECTOMY Left 1996    benign    REMOVAL SPURS RIGHT GREAT TOE   YRS AGO   TOTAL KNEE ARTHROPLASTY Left 09-16-2007           Family History  Problem Relation Age of Onset   Neuropathy Neg Hx        SOCIAL HISTORY: Social History         Socioeconomic History   Marital status: Married      Spouse name: Not on file   Number of children: 1   Years of education: Not on file   Highest education level: Some college, no degree  Occupational History   Not on file  Tobacco Use   Smoking status: Never   Smokeless tobacco: Never  Vaping Use   Vaping Use: Never used  Substance and Sexual Activity   Alcohol use: Yes      Comment: rare   Drug use: No   Sexual activity: Not on file  Other Topics Concern   Not on file  Social History Narrative    Lives at home with wife, who is Therapist, sports.  Pt is retired.  Caffeine 2-3 cups per week. Right handed.     Social Determinants of Health    Financial Resource Strain: Not on file  Food Insecurity: Not on file  Transportation Needs: Not on file  Physical Activity: Not on file  Stress: Not on file  Social Connections: Not on file  Intimate Partner Violence: Not on file      No Known Allergies         Current Outpatient Medications  Medication Sig Dispense Refill   acetaminophen (TYLENOL) 500 MG tablet Take 1,000 mg by mouth every 6 (six) hours as needed for mild pain.       amphetamine-dextroamphetamine (ADDERALL XR) 30 MG 24 hr capsule Take 30 mg by mouth daily.       Apoaequorin 10 MG CAPS Take 10 mg by mouth daily.        aspirin EC 81 MG EC tablet Take 1 tablet (81 mg total) by mouth daily. 30 tablet 1   atorvastatin (LIPITOR) 80 MG tablet TAKE 1 TABLET BY MOUTH EVERY DAY AT 6PM (Patient taking differently: Take 80 mg by mouth every evening.) 90 tablet 0   buPROPion (WELLBUTRIN XL) 300 MG 24 hr tablet Take 300 mg by mouth daily.       carvedilol (COREG) 6.25 MG tablet TAKE 1 TABLET (6.25 MG TOTAL) BY MOUTH 2 (TWO) TIMES DAILY WITH A MEAL. 180 tablet 2   DULoxetine  (CYMBALTA) 60 MG capsule Take 60 mg by mouth every evening.  Epinastine HCl 0.05 % ophthalmic solution Place 1 drop into both eyes 2 (two) times daily as needed (allergies).       fentaNYL (DURAGESIC) 75 MCG/HR Place 1 patch onto the skin every 3 (three) days.       folic acid (FOLVITE) 1 MG tablet Take 1 mg by mouth daily.       furosemide (LASIX) 20 MG tablet TAKE 1 TABLET BY MOUTH THREE TIMES A DAY AS NEEDED BETWEEN MEALS AND AT BEDTIME (Patient taking differently: Take 20 mg by mouth daily as needed for fluid or edema.) 270 tablet 0   gabapentin (NEURONTIN) 600 MG tablet Take 600 mg by mouth daily.       HYDROcodone-acetaminophen (NORCO) 10-325 MG tablet Take 1 tablet by mouth 4 (four) times daily as needed for moderate pain.       Hyoscyamine Sulfate SL 0.125 MG SUBL Place 0.125 mg under the tongue 2 (two) times daily as needed (diarrhea).       ipratropium (ATROVENT) 0.03 % nasal spray Place 2 sprays into both nostrils 3 (three) times daily as needed for rhinitis.       ketoconazole (NIZORAL) 2 % cream Apply 1 application topically 2 (two) times daily as needed for irritation.       metFORMIN (GLUCOPHAGE) 1000 MG tablet Take 1,000 mg by mouth 2 (two) times daily with a meal.       nitroGLYCERIN (NITROSTAT) 0.4 MG SL tablet Place 1 tablet (0.4 mg total) under the tongue every 5 (five) minutes x 3 doses as needed for chest pain. 25 tablet 2   pioglitazone (ACTOS) 30 MG tablet Take 30 mg by mouth every morning.       Testosterone 20.25 MG/ACT (1.62%) GEL Apply 1 application topically at bedtime.       TRULICITY 1.5 KW/4.0XB SOPN Inject 0.5 mLs into the skin every Saturday.        losartan (COZAAR) 50 MG tablet Take 1 tablet (50 mg total) by mouth daily. (Patient taking differently: Take 100 mg by mouth daily.) 90 tablet 3    No current facility-administered medications for this visit.      REVIEW OF SYSTEMS:  [X]  denotes positive finding, [ ]  denotes negative finding Cardiac   Comments:   Chest pain or chest pressure:      Shortness of breath upon exertion:      Short of breath when lying flat:      Irregular heart rhythm:             Vascular      Pain in calf, thigh, or hip brought on by ambulation:      Pain in feet at night that wakes you up from your sleep:       Blood clot in your veins:      Leg swelling:  x           Pulmonary      Oxygen at home:      Productive cough:       Wheezing:              Neurologic      Sudden weakness in arms or legs:       Sudden numbness in arms or legs:       Sudden onset of difficulty speaking or slurred speech:      Temporary loss of vision in one eye:       Problems with dizziness:  Gastrointestinal      Blood in stool:       Vomited blood:              Genitourinary      Burning when urinating:       Blood in urine:             Psychiatric      Major depression:              Hematologic      Bleeding problems:      Problems with blood clotting too easily:             Skin      Rashes or ulcers:             Constitutional      Fever or chills:          PHYSICAL EXAM:    Vitals:    05/24/21 0828  BP: (!) 156/92  Pulse: 79  Resp: 18  Temp: (!) 97.5 F (36.4 C)  TempSrc: Temporal  Weight: 244 lb (110.7 kg)  Height: 6' (1.829 m)      GENERAL: The patient is a well-nourished male, in no acute distress. The vital signs are documented above. CARDIAC: There is a regular rate and rhythm.  VASCULAR:  Palpable femoral pulses bilaterally No palpable pedal pulses PULMONARY: No respiratory distress. ABDOMEN: Soft and non-tender. MUSCULOSKELETAL: There are no major deformities or cyanosis. NEUROLOGIC: No focal weakness or paresthesias are detected. SKIN: There are no ulcers or rashes noted. PSYCHIATRIC: The patient has a normal affect.   DATA:    MRI reviewed and aortic bifurcation at the bottom of L4 with vein bifurcation at the top of L5.       Assessment/Plan:   70 year old male  with multiple medical comorbidities that presents for preop evaluation of abdominal exposure for L4-L5 OLIF.  I have reviewed his recent MRI from 02/12/2021 as well as examining his abdomen and I think he would be a good candidate for oblique approach.  I discussed oblique incision over the left oblique muscles and then entering the retroperitoneum with him in the right lateral position to expose the L4-L5 disc space.  Discussed mobilizing the peritoneum and left ureter as well as the left psoas muscle to expose the L4-L5 disc space from oblique approach.  Discussed may require mobilization of the vessels as well and discussed risk of injury to the above structures including vessel injury.  Look forward to assisting Dr. Rolena Infante.   Marty Heck, MD [facility] Office: [phone]

## 2021-05-26 NOTE — Transfer of Care (Signed)
Immediate Anesthesia Transfer of Care Note  Patient: Devin Combs  Procedure(s) Performed: OBLIQUE LUMBAR INTERBODY FUSION 1 LEVEL WITH PERCUTANEOUS SCREWS (OLIF L4-5 WITH POSTERIOR SPINAL FUSION INTERBODY) (Left: Abdomen) ABDOMINAL EXPOSURE (Left: Abdomen)  Patient Location: PACU  Anesthesia Type:General and Regional  Level of Consciousness: drowsy and responds to stimulation  Airway & Oxygen Therapy: Patient Spontanous Breathing and Patient connected to face mask oxygen  Post-op Assessment: Report given to RN and Post -op Vital signs reviewed and stable  Post vital signs: Reviewed and stable  Last Vitals:  Vitals Value Taken Time  BP 136/81 05/26/21 1245  Temp    Pulse 88 05/26/21 1248  Resp 16 05/26/21 1248  SpO2 100 % 05/26/21 1248  Vitals shown include unvalidated device data.  Last Pain:  Vitals:   05/26/21 0622  TempSrc: Oral  PainSc:          Complications: No notable events documented.

## 2021-05-27 ENCOUNTER — Other Ambulatory Visit: Payer: Self-pay | Admitting: Cardiovascular Disease

## 2021-05-27 ENCOUNTER — Encounter (HOSPITAL_COMMUNITY): Payer: Self-pay | Admitting: Orthopedic Surgery

## 2021-05-27 ENCOUNTER — Ambulatory Visit (HOSPITAL_BASED_OUTPATIENT_CLINIC_OR_DEPARTMENT_OTHER): Payer: Medicare Other | Admitting: Physical Therapy

## 2021-05-27 DIAGNOSIS — Z981 Arthrodesis status: Secondary | ICD-10-CM

## 2021-05-27 LAB — GLUCOSE, CAPILLARY
Glucose-Capillary: 128 mg/dL — ABNORMAL HIGH (ref 70–99)
Glucose-Capillary: 137 mg/dL — ABNORMAL HIGH (ref 70–99)
Glucose-Capillary: 146 mg/dL — ABNORMAL HIGH (ref 70–99)
Glucose-Capillary: 132 mg/dL — ABNORMAL HIGH (ref 70–99)

## 2021-05-27 MED ORDER — TAMSULOSIN HCL 0.4 MG PO CAPS
0.4000 mg | ORAL_CAPSULE | Freq: Every day | ORAL | Status: DC
  Administered 2021-05-27: 0.4 mg via ORAL
  Filled 2021-05-27 (×2): qty 1

## 2021-05-27 MED ORDER — SORBITOL 70 % SOLN
30.0000 mL | Freq: Every day | Status: DC | PRN
  Administered 2021-05-27: 30 mL via ORAL
  Filled 2021-05-27: qty 30

## 2021-05-27 MED ORDER — ONDANSETRON HCL 4 MG PO TABS: 4.0000 mg | ORAL_TABLET | Freq: Three times a day (TID) | ORAL | 0 refills | Status: DC | PRN

## 2021-05-27 MED ORDER — TAMSULOSIN HCL 0.4 MG PO CAPS: 0.4000 mg | ORAL_CAPSULE | Freq: Every day | ORAL | 0 refills | Status: DC

## 2021-05-27 NOTE — Evaluation (Signed)
Physical Therapy Evaluation Patient Details Name: Devin Combs MRN: [id number] DOB: 1950-10-04 Today's Date: 05/27/2021  History of Present Illness  70 y.o. male presents to [facility] hospital on 05/26/2021 with severe back and LE radiating pain. Pt underwent oblique lumbar interbody fusion L4-5 with supplemental posterior fixation on 05/26/2021. PMH includes systolic heart failure, cancer, anxiety, depression, HTN, HLD, NSTEMI, orthostatic hypotension, DMII.  Clinical Impression  Pt presents to PT with deficits in functional mobility, gait, balance, power, endurance. Pt is limited by back discomfort during session, but is able to ambulate for household distances and does not require physical assistance to mobilize at this time. PT provides verbal cueing to maintain back precautions when performing bed mobility. Pt will benefit from continued aggressive mobilization to improve activity tolerance and aide in reducing pain. PT recommends outpatient PT services once cleared by surgeon.     Recommendations for follow up therapy are one component of a multi-disciplinary discharge planning process, led by the attending physician.  Recommendations may be updated based on patient status, additional functional criteria and insurance authorization.  Follow Up Recommendations Other (comment) (outpatient once cleared by surgeon)    Equipment Recommendations  Rolling walker with 5" wheels    Recommendations for Other Services       Precautions / Restrictions Precautions Precautions: Back Precaution Booklet Issued: Yes (comment) Required Braces or Orthoses: Spinal Brace Spinal Brace: Lumbar corset;Applied in sitting position Restrictions Weight Bearing Restrictions: No      Mobility  Bed Mobility Overal bed mobility: Needs Assistance Bed Mobility: Rolling;Sidelying to Sit;Sit to Sidelying Rolling: Min guard Sidelying to sit: Supervision     Sit to sidelying: Supervision General bed mobility  comments: increased time, verbal cues for log roll technique    Transfers Overall transfer level: Needs assistance Equipment used: Rolling walker (2 wheeled) Transfers: Sit to/from Stand Sit to Stand: Supervision            Ambulation/Gait Ambulation/Gait assistance: Supervision Gait Distance (Feet): 200 Feet Assistive device: Rolling walker (2 wheeled) Gait Pattern/deviations: Step-through pattern Gait velocity: functional Gait velocity interpretation: 1.31 - 2.62 ft/sec, indicative of limited community ambulator General Gait Details: pt with slowed step-through gait, mild increase in trunk flexion. PT attempts to raise walker height to improve posture however pt reports fatigue/discomfort in arms with elevated walker, requesting return to previous height  Stairs            Wheelchair Mobility    Modified Rankin (Stroke Patients Only)       Balance Overall balance assessment: Needs assistance Sitting-balance support: No upper extremity supported;Feet supported Sitting balance-Leahy Scale: Good     Standing balance support: No upper extremity supported Standing balance-Leahy Scale: Fair                               Pertinent Vitals/Pain Pain Assessment: 0-10 Pain Score: 8  Pain Location: back Pain Descriptors / Indicators: Sore Pain Intervention(s): Monitored during session    Home Living Family/patient expects to be discharged to:: Private residence Living Arrangements: Spouse/significant other Available Help at Discharge: Family;Available 24 hours/day Type of Home: House Home Access: Stairs to enter Entrance Stairs-Rails: None Entrance Stairs-Number of Steps: threshold Home Layout: One level Home Equipment: Cane - single point;Shower seat;Grab bars - tub/shower      Prior Function Level of Independence: Independent with assistive device(s)         Comments: pt utilizing SPC to ambulate  Hand Dominance         Extremity/Trunk Assessment   Upper Extremity Assessment Upper Extremity Assessment: Overall WFL for tasks assessed    Lower Extremity Assessment Lower Extremity Assessment: Overall WFL for tasks assessed    Cervical / Trunk Assessment Cervical / Trunk Assessment: Other exceptions Cervical / Trunk Exceptions: s/p back surgery, incision on back and L lateral abdomen  Communication   Communication: No difficulties  Cognition Arousal/Alertness: Awake/alert Behavior During Therapy: WFL for tasks assessed/performed Overall Cognitive Status: Within Functional Limits for tasks assessed                                        General Comments General comments (skin integrity, edema, etc.): VSS on RA, incisions clean, dry, dressings intact    Exercises     Assessment/Plan    PT Assessment Patient needs continued PT services  PT Problem List Decreased activity tolerance;Decreased strength;Decreased balance;Decreased mobility;Decreased knowledge of precautions;Pain       PT Treatment Interventions DME instruction;Gait training;Functional mobility training;Therapeutic activities;Therapeutic exercise;Balance training;Neuromuscular re-education;Patient/family education    PT Goals (Current goals can be found in the Care Plan section)  Acute Rehab PT Goals Patient Stated Goal: to improve activity tolerance and reduce pain PT Goal Formulation: With patient/family Time For Goal Achievement: 06/01/21 Potential to Achieve Goals: Good    Frequency Min 5X/week   Barriers to discharge        Co-evaluation               AM-PAC PT "6 Clicks" Mobility  Outcome Measure Help needed turning from your back to your side while in a flat bed without using bedrails?: A Little Help needed moving from lying on your back to sitting on the side of a flat bed without using bedrails?: A Little Help needed moving to and from a bed to a chair (including a wheelchair)?: A  Little Help needed standing up from a chair using your arms (e.g., wheelchair or bedside chair)?: A Little Help needed to walk in hospital room?: A Little Help needed climbing 3-5 steps with a railing? : A Little 6 Click Score: 18    End of Session Equipment Utilized During Treatment: Back brace Activity Tolerance: Patient tolerated treatment well Patient left: in bed;with call bell/phone within reach;with family/visitor present Nurse Communication: Mobility status PT Visit Diagnosis: Other abnormalities of gait and mobility (R26.89);Muscle weakness (generalized) (M62.81);Pain Pain - part of body:  (back)    Time: 3570-1779 PT Time Calculation (min) (ACUTE ONLY): 30 min   Charges:   PT Evaluation $PT Eval Low Complexity: [place], PT, DPT Acute Rehabilitation Pager: [phone] Office [phone]   Zenaida Niece 05/27/2021, 10:32 AM

## 2021-05-27 NOTE — Progress Notes (Signed)
    Subjective: Procedure(s) (LRB): OBLIQUE LUMBAR INTERBODY FUSION 1 LEVEL WITH PERCUTANEOUS SCREWS (OLIF L4-5 WITH POSTERIOR SPINAL FUSION INTERBODY) (Left) ABDOMINAL EXPOSURE (Left) 1 Day Post-Op  Patient reports pain as 4 on 0-10 scale.  Reports decreased leg pain reports incisional back pain   Positive void Negative bowel movement Negative flatus Negative chest pain or shortness of breath  Objective: Vital signs in last 24 hours: Temp:  [97.4 F (36.3 C)-98.6 F (37 C)] 98.3 F (36.8 C) (10/14 0431) Pulse Rate:  [76-101] 76 (10/14 0431) Resp:  [11-20] 18 (10/14 0431) BP: (114-150)/(57-96) 122/57 (10/14 0431) SpO2:  [91 %-100 %] 97 % (10/14 0431) Arterial Line BP: (160-176)/(70-74) 176/74 (10/13 1400)  Intake/Output from previous day: 10/13 0701 - 10/14 0700 In: 2040 [P.O.:240; I.V.:1700; IV Piggyback:100] Out: 625 [Urine:325; Blood:300]  Labs: No results for input(s): WBC, RBC, HCT, PLT in the last 72 hours. No results for input(s): NA, K, CL, CO2, BUN, CREATININE, GLUCOSE, CALCIUM in the last 72 hours. No results for input(s): LABPT, INR in the last 72 hours.  Physical Exam: Neurologically intact ABD soft Intact pulses distally Incision: dressing C/D/I and no drainage Compartment soft Body mass index is 33.09 kg/m.   Assessment/Plan: Patient stable  Continue mobilization with physical therapy Continue care  Overall patient is slowly improving. Patient is ambulated down the hallway with decreased pain.  Preoperative neuropathic leg pain has begun to improve.  Primary issue is incisional/surgical pain. 1.  Plan on mobilization today with physical therapy. 2.  Enema for constipation. 3.  If patient has bowel movement then we will plan on discharge tomorrow.  Patient has fentanyl patches at home and so will be discharged with Zofran for nausea and Flomax. 4.  If he is unable to have a bowel movement then we may need to transfer him to a different floor and  plan on discharge Sunday.  But at this point I do think he will be able to be discharged Saturday. 5.  Patient will have a follow-up with his pain management provider next week and I will defer ongoing narcotic medications to that provider.  Melina Schools, MD [facility] [phone]

## 2021-05-27 NOTE — Progress Notes (Signed)
[facility]  Subjective  -states has had some ongoing back pain overnight.  No nausea or vomiting.   Objective (!) 156/72 80 (!) 97.5 F (36.4 C) (Oral) 18 98%  Intake/Output Summary (Last 24 hours) at 05/27/2021 0800 Last data filed at 05/27/2021 0600 Gross per 24 hour  Intake 2040 ml  Output 625 ml  Net 1415 ml    Appropriate postop incisional tenderness of the abdomen Left DP palpable  Laboratory Lab Results: No results for input(s): WBC, HGB, HCT, PLT in the last 72 hours. BMET No results for input(s): NA, K, CL, CO2, GLUCOSE, BUN, CREATININE, CALCIUM in the last 72 hours.  COAG Lab Results  Component Value Date   INR 1.0 05/19/2021   INR 1.08 07/13/2016   INR 2.7 (H) 09/19/2007   No results found for: PTT  Assessment/Planning:  Postop day 1 status post L4-L5 OLIF with abdominal exposure.  Overall looks good this morning.  No nausea or vomiting.  Appropriate postop incisional tenderness.  Left DP palpable.  Vascular is available if questions or concerns arise.  Marty Heck 05/27/2021 8:00 AM --

## 2021-05-27 NOTE — Discharge Instructions (Signed)

## 2021-05-27 NOTE — Evaluation (Signed)
Occupational Therapy Evaluation Patient Details Name: Devin Combs MRN: [id number] DOB: Feb 14, 1951 Today's Date: 05/27/2021   History of Present Illness 70 y.o. male presents to [facility] hospital on 05/26/2021 with severe back and LE radiating pain. Pt underwent oblique lumbar interbody fusion L4-5 with supplemental posterior fixation on 05/26/2021. PMH includes systolic heart failure, cancer, anxiety, depression, HTN, HLD, NSTEMI, orthostatic hypotension, DMII.   Clinical Impression   Pt presents with decreased balance/mobility/activity tolerance and pain. Pt currently requiring setup - Min A with ADLs due to these deficits. Pt's wife is retired Marine scientist and will be available to provide 24 hour assistance at home as needed. Pt educated on back precautions, AE use, and compensatory strategies for ADLs, verbalizing/demonstrating understanding. Pt should be safe to return home without any further skilled OT services once medically cleared. No further skilled OT needs at this time. Will sign off.       Recommendations for follow up therapy are one component of a multi-disciplinary discharge planning process, led by the attending physician.  Recommendations may be updated based on patient status, additional functional criteria and insurance authorization.   Follow Up Recommendations  No OT follow up    Equipment Recommendations  None recommended by OT    Recommendations for Other Services       Precautions / Restrictions Precautions Precautions: Back Precaution Booklet Issued: Yes (comment) Required Braces or Orthoses: Spinal Brace Restrictions Weight Bearing Restrictions: No      Mobility Bed Mobility Overal bed mobility: Needs Assistance Bed Mobility: Rolling;Sidelying to Sit;Sit to Sidelying Rolling: Supervision Sidelying to sit: Supervision     Sit to sidelying: Supervision      Transfers Overall transfer level: Needs assistance Equipment used: Rolling walker (2  wheeled) Transfers: Sit to/from Stand Sit to Stand: Min guard              Balance Overall balance assessment: Needs assistance Sitting-balance support: Feet supported;No upper extremity supported Sitting balance-Leahy Scale: Good     Standing balance support: During functional activity;Bilateral upper extremity supported;No upper extremity supported Standing balance-Leahy Scale: Fair                             ADL either performed or assessed with clinical judgement   ADL Overall ADL's : Needs assistance/impaired Eating/Feeding: Independent   Grooming: Supervision/safety;Standing;With caregiver independent assisting   Upper Body Bathing: Supervision/ safety;Sitting;With caregiver independent assisting   Lower Body Bathing: Minimal assistance;Adhering to back precautions;Sitting/lateral leans;Sit to/from stand;With caregiver independent assisting   Upper Body Dressing : Set up;Sitting;With caregiver independent assisting   Lower Body Dressing: Minimal assistance;Sitting/lateral leans;Sit to/from stand;With caregiver independent assisting   Toilet Transfer: Min guard;Ambulation;Regular Toilet;Grab bars;RW;With caregiver independent assisting   Toileting- Clothing Manipulation and Hygiene: Supervision/safety;Sitting/lateral lean;With caregiver independent assisting   Tub/ Shower Transfer: Min guard;With caregiver independent assisting;Ambulation;Shower seat;Grab bars;Rolling walker   Functional mobility during ADLs: Min guard;Rolling walker;Caregiver able to provide necessary level of assistance General ADL Comments: Pt limited by pain, activity tolerance, and balance. Pt's wife present during eval, retired Marine scientist able to provide assistance at home as necessary.     Vision   Vision Assessment?: No apparent visual deficits     Perception     Praxis      Pertinent Vitals/Pain Pain Assessment: 0-10 Pain Score: 8  Pain Location: back, L hip Pain  Descriptors / Indicators: Aching;Discomfort;Grimacing;Guarding;Sore Pain Intervention(s): Monitored during session;Repositioned     Hand Dominance  Extremity/Trunk Assessment Upper Extremity Assessment Upper Extremity Assessment: Overall WFL for tasks assessed   Lower Extremity Assessment Lower Extremity Assessment: Defer to PT evaluation   Cervical / Trunk Assessment Cervical / Trunk Assessment: Other exceptions Cervical / Trunk Exceptions: s/p back surgery   Communication Communication Communication: No difficulties   Cognition Arousal/Alertness: Awake/alert Behavior During Therapy: WFL for tasks assessed/performed Overall Cognitive Status: Within Functional Limits for tasks assessed                                     General Comments       Exercises     Shoulder Instructions      Home Living Family/patient expects to be discharged to:: Private residence Living Arrangements: Spouse/significant other Available Help at Discharge: Family;Available 24 hours/day Type of Home: House Home Access: Stairs to enter [employer] of Steps: 1 Entrance Stairs-Rails: None Home Layout: One level     Bathroom Shower/Tub: Teacher, early years/pre: Standard     Home Equipment: Cane - single point;Shower seat;Grab bars - tub/shower          Prior Functioning/Environment Level of Independence: Independent with assistive device(s)                 OT Problem List: Decreased activity tolerance;Impaired balance (sitting and/or standing);Pain      OT Treatment/Interventions:      OT Goals(Current goals can be found in the care plan section) Acute Rehab OT Goals Patient Stated Goal: return home OT Goal Formulation: With patient/family  OT Frequency:     Barriers to D/C:            Co-evaluation              AM-PAC OT "6 Clicks" Daily Activity     Outcome Measure Help from another person eating meals?: None Help from  another person taking care of personal grooming?: A Little Help from another person toileting, which includes using toliet, bedpan, or urinal?: A Little Help from another person bathing (including washing, rinsing, drying)?: A Little Help from another person to put on and taking off regular upper body clothing?: A Little Help from another person to put on and taking off regular lower body clothing?: A Little 6 Click Score: 19   End of Session Equipment Utilized During Treatment: Rolling walker;Back brace Nurse Communication: Mobility status  Activity Tolerance: Patient limited by pain Patient left: in bed;with call bell/phone within reach;with family/visitor present  OT Visit Diagnosis: Unsteadiness on feet (R26.81);Other abnormalities of gait and mobility (R26.89);Muscle weakness (generalized) (M62.81);Pain                Time: 0825-0904 OT Time Calculation (min): 39 min Charges:  OT General Charges $OT Visit: 1 Visit OT Evaluation $OT Eval Low Complexity: 1 Low OT Treatments $Self Care/Home Management : 23-37 mins  Lorree Millar C, OT/L  Acute Rehab [place] 05/27/2021, 9:20 AM

## 2021-05-27 NOTE — Anesthesia Postprocedure Evaluation (Signed)
Anesthesia Post Note  Patient: Devin Combs  Procedure(s) Performed: OBLIQUE LUMBAR INTERBODY FUSION 1 LEVEL WITH PERCUTANEOUS SCREWS (OLIF L4-5 WITH POSTERIOR SPINAL FUSION INTERBODY) (Left: Abdomen) ABDOMINAL EXPOSURE (Left: Abdomen)     Patient location during evaluation: PACU Anesthesia Type: General Level of consciousness: awake and alert and oriented Pain management: pain level controlled Vital Signs Assessment: post-procedure vital signs reviewed and stable Respiratory status: spontaneous breathing, nonlabored ventilation and respiratory function stable Cardiovascular status: blood pressure returned to baseline Postop Assessment: no apparent nausea or vomiting Anesthetic complications: no   No notable events documented.             Marthenia Rolling

## 2021-05-28 LAB — GLUCOSE, CAPILLARY: Glucose-Capillary: 114 mg/dL — ABNORMAL HIGH (ref 70–99)

## 2021-05-28 NOTE — Progress Notes (Signed)
Physical Therapy Treatment Patient Details Name: Devin Combs MRN: [id number] DOB: [birth year] Today's Date: 05/28/2021   History of Present Illness 70 y.o. male presents to [facility] hospital on 05/26/2021 with severe back and LE radiating pain. Pt underwent oblique lumbar interbody fusion L4-5 with supplemental posterior fixation on 05/26/2021. PMH includes systolic heart failure, cancer, anxiety, depression, HTN, HLD, NSTEMI, orthostatic hypotension, DMII.    PT Comments    Pt sidelying in bed on entry, reporting it is a position of comfort. PT recommending pillow between knees to assist with hip and back alignment, pt agreeable. Pt is currently supervision for bed mobility, transfers and ambulation of 200 feet with RW. Continue to provide feedback about upright posture and increased UE support. D/c plans appropriate at this time and pt and wife expecting to d/c this morning.      Recommendations for follow up therapy are one component of a multi-disciplinary discharge planning process, led by the attending physician.  Recommendations may be updated based on patient status, additional functional criteria and insurance authorization.  Follow Up Recommendations  Other (comment) (outpatient once cleared by surgeon)     Equipment Recommendations  Rolling walker with 5" wheels       Precautions / Restrictions Precautions Precautions: Back Precaution Booklet Issued: Yes (comment) Required Braces or Orthoses: Spinal Brace Spinal Brace: Lumbar corset;Applied in sitting position Restrictions Weight Bearing Restrictions: No     Mobility  Bed Mobility Overal bed mobility: Needs Assistance Bed Mobility: Sidelying to Sit;Sit to Sidelying Rolling: Min guard Sidelying to sit: Supervision     Sit to sidelying: Supervision General bed mobility comments: on entry pt in sidelying, pt reports more comfortable position, pt with good power up of trunk and management of LE off bed. supervision to  return to sidelying and requests to stay in sidelying, however LE hanging off bed, pt able to roll onto back perform hipbridging to scoot hips back into bed before returning to sidelying    Transfers Overall transfer level: Needs assistance Equipment used: Rolling walker (2 wheeled) Transfers: Sit to/from Stand Sit to Stand: Supervision            Ambulation/Gait Ambulation/Gait assistance: Supervision Gait Distance (Feet): 200 Feet Assistive device: Rolling walker (2 wheeled) Gait Pattern/deviations: Step-through pattern Gait velocity: functional Gait velocity interpretation: 1.31 - 2.62 ft/sec, indicative of limited community ambulator General Gait Details: supervision for safety, decreasing velocity and increased back flexion with distance cuing for proximity to RW and increased triceps activation for support as opposed to forward lean, pt with difficulty maintaining upright posture due to fatigue in UE          Balance Overall balance assessment: Needs assistance Sitting-balance support: No upper extremity supported;Feet supported Sitting balance-Leahy Scale: Good     Standing balance support: No upper extremity supported Standing balance-Leahy Scale: Fair                              Cognition Arousal/Alertness: Awake/alert Behavior During Therapy: WFL for tasks assessed/performed Overall Cognitive Status: Within Functional Limits for tasks assessed                                           General Comments General comments (skin integrity, edema, etc.): VSS on RA, pt with good donning and doffing of back brace with assist to place behind  him, wife in room and agreeable to help pt as needed      Pertinent Vitals/Pain Pain Assessment: Faces Faces Pain Scale: Hurts little more Pain Location: back Pain Descriptors / Indicators: Sore Pain Intervention(s): Limited activity within patient's tolerance;Monitored during  session;Repositioned;Patient requesting pain meds-RN notified     PT Goals (current goals can now be found in the care plan section) Acute Rehab PT Goals Patient Stated Goal: to improve activity tolerance and reduce pain PT Goal Formulation: With patient/family Time For Goal Achievement: 06/01/21 Potential to Achieve Goals: Good Progress towards PT goals: Progressing toward goals    Frequency    Min 5X/week      PT Plan Current plan remains appropriate       AM-PAC PT "6 Clicks" Mobility   Outcome Measure  Help needed turning from your back to your side while in a flat bed without using bedrails?: A Little Help needed moving from lying on your back to sitting on the side of a flat bed without using bedrails?: A Little Help needed moving to and from a bed to a chair (including a wheelchair)?: A Little Help needed standing up from a chair using your arms (e.g., wheelchair or bedside chair)?: A Little Help needed to walk in hospital room?: A Little Help needed climbing 3-5 steps with a railing? : A Little 6 Click Score: 18    End of Session Equipment Utilized During Treatment: Back brace Activity Tolerance: Patient tolerated treatment well Patient left: in bed;with call bell/phone within reach;with family/visitor present Nurse Communication: Mobility status PT Visit Diagnosis: Other abnormalities of gait and mobility (R26.89);Muscle weakness (generalized) (M62.81);Pain Pain - part of body:  (back)     Time: 1660-6004 PT Time Calculation (min) (ACUTE ONLY): 20 min  Charges:  $Gait Training: 8-22 mins                     [name] B. Migdalia Dk PT, DPT Acute Rehabilitation Services Pager [phone] Office [phone]    [place] 05/28/2021, 11:02 AM

## 2021-05-28 NOTE — Progress Notes (Signed)
Patient is discharged from room 3C11 at this time. Alert and in stable condition. IV site d/c'd and instructions read to patient and spouse with understanding verbalized and all questions answered. Left unit via wheelchair with all belongings at side.

## 2021-05-28 NOTE — Progress Notes (Signed)
    Subjective: Procedure(s) (LRB): OBLIQUE LUMBAR INTERBODY FUSION 1 LEVEL WITH PERCUTANEOUS SCREWS (OLIF L4-5 WITH POSTERIOR SPINAL FUSION INTERBODY) (Left) ABDOMINAL EXPOSURE (Left) 2 Days Post-Op  Patient reports pain as 4 on 0-10 scale.  Reports decreased leg pain reports incisional back pain   Positive void Positive bowel movement Positive flatus Negative chest pain or shortness of breath  Objective: Vital signs in last 24 hours: Temp:  [97.7 F (36.5 C)-98.7 F (37.1 C)] 98.4 F (36.9 C) (10/15 0735) Pulse Rate:  [80-100] 100 (10/15 0735) Resp:  [18-20] 18 (10/15 0735) BP: (96-135)/(48-80) 122/59 (10/15 0735) SpO2:  [96 %-100 %] 96 % (10/15 0735)  Intake/Output from previous day: No intake/output data recorded.  Labs: No results for input(s): WBC, RBC, HCT, PLT in the last 72 hours. No results for input(s): NA, K, CL, CO2, BUN, CREATININE, GLUCOSE, CALCIUM in the last 72 hours. No results for input(s): LABPT, INR in the last 72 hours.  Physical Exam: Neurologically intact ABD soft Intact pulses distally Incision: dressing C/D/I and no drainage Compartment soft Body mass index is 33.09 kg/m.   Assessment/Plan: Patient stable  D/C home Patient has fentanyl patches at home and so will be discharged with Zofran for nausea and Flomax. Patient will have a follow-up with his pain management provider next week and we will defer ongoing narcotic medications to that provider.  Dorothyann Peng, PA  [facility] [phone]

## 2021-05-30 NOTE — Discharge Summary (Signed)
Physician Discharge Summary  Patient ID: Devin Combs MRN: [id number] DOB/AGE: [id number] y.o.  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  Discharge Diagnoses:  Active Problems:   S/P lumbar fusion   Past Medical History:  Diagnosis Date   Acute systolic heart failure (HCC)    Anemia    Anxiety    Arthritis    Cancer ([place])    basal and squamous cell carcinoma   CHF (congestive heart failure) (HCC)    Chronic back pain    DDD (degenerative disc disease), thoracolumbar    Degenerative cervical spinal stenosis    Depression    Diabetic autonomic neuropathy (HCC)    Dyspnea    with exertion    GERD (gastroesophageal reflux disease)    Headache    History of bronchitis    History of kidney stones    Hyperlipidemia    Hypertension    Hypogonadism in male    Myocardial infarction [facility])    Neuropathy    NSTEMI (non-ST elevated myocardial infarction) ([place])    10/29/2018- PCI/DESx1 to pLAD, EF 35%   Orthostatic hypotension    OSA (obstructive sleep apnea)    per pt study done 2006 (approx)  used cpap few yrs then stopped using, stated didn't feel he needed it anymore   Psoriasis    Retinal tear    Right knee meniscal tear    Type 2 diabetes mellitus ([place])    Urinary hesitancy    Vitamin D deficiency     Surgeries: Procedure(s): OBLIQUE LUMBAR INTERBODY FUSION 1 LEVEL WITH PERCUTANEOUS SCREWS (OLIF L4-5 WITH POSTERIOR SPINAL FUSION INTERBODY) ABDOMINAL EXPOSURE on 05/26/2021   Consultants (if any): Treatment Team:  Marty Heck, MD  Discharged Condition: Improved  Hospital Course: Devin Combs is an 70 y.o. 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    He was given perioperative antibiotics:  Anti-infectives (From admission,  onward)    Start     Dose/Rate Route Frequency Ordered Stop   05/26/21 1600  ceFAZolin (ANCEF) IVPB 1 g/50 mL premix        1 g 100 mL/hr over 30 Minutes Intravenous Every 8 hours 05/26/21 1513 05/26/21 2346   05/26/21 0549  ceFAZolin (ANCEF) IVPB 2g/100 mL premix        2 g 200 mL/hr over 30 Minutes Intravenous 30 min pre-op 05/26/21 0549 05/26/21 0838     .  He was given sequential compression devices, early ambulation, and lovenox for DVT prophylaxis.  He benefited maximally from the hospital stay and there were no complications.    Recent vital signs:  Vitals:   05/28/21 0536 05/28/21 0735  BP: 132/74 (!) 122/59  Pulse: 90 100  Resp: 20 18  Temp: 97.7 F (36.5 C) 98.4 F (36.9 C)  SpO2: 96% 96%    Recent laboratory studies:  Lab Results  Component Value Date   HGB 15.2 05/19/2021   HGB 12.1 (L) 10/31/2018   HGB 12.6 (L) 10/30/2018   Lab Results  Component Value Date   WBC 8.1 05/19/2021   PLT 271 05/19/2021   Lab Results  Component Value Date   INR 1.0 05/19/2021   Lab Results  Component Value Date   NA 140 05/19/2021   K 4.0 05/19/2021   CL 105 05/19/2021  CO2 26 05/19/2021   BUN 16 05/19/2021   CREATININE 1.15 05/19/2021   GLUCOSE 128 (H) 05/19/2021     WEIGHT BEARING   Weight bearing as tolerated with assist device (walker, cane, etc) as directed, use it as long as suggested by your surgeon or therapist, typically at least 4-6 weeks.   EXERCISES  Results after joint replacement surgery are often greatly improved when you follow the exercise, range of motion and muscle strengthening exercises prescribed by your doctor. Safety measures are also important to protect the joint from further injury. Any time any of these exercises cause you to have increased pain or swelling, decrease what you are doing until you are comfortable again and then slowly increase them. If you have problems or questions, call your caregiver or physical therapist for  advice.   Rehabilitation is important following a joint replacement. After just a few days of immobilization, the muscles of the leg can become weakened and shrink (atrophy).  These exercises are designed to build up the tone and strength of the thigh and leg muscles and to improve motion. Often times heat used for twenty to thirty minutes before working out will loosen up your tissues and help with improving the range of motion but do not use heat for the first two weeks following surgery (sometimes heat can increase post-operative swelling).   These exercises can be done on a training (exercise) mat, on the floor, on a table or on a bed. Use whatever works the best and is most comfortable for you.    Use music or television while you are exercising so that the exercises are a pleasant break in your day. This will make your life better with the exercises acting as a break in your routine that you can look forward to.   Perform all exercises about fifteen times, three times per day or as directed.  You should exercise both the operative leg and the other leg as well.  Exercises include:   Quad Sets - Tighten up the muscle on the front of the thigh (Quad) and hold for 5-10 seconds.   Straight Leg Raises - With your knee straight (if you were given a brace, keep it on), lift the leg to 60 degrees, hold for 3 seconds, and slowly lower the leg.  Perform this exercise against resistance later as your leg gets stronger.  Leg Slides: Lying on your back, slowly slide your foot toward your buttocks, bending your knee up off the floor (only go as far as is comfortable). Then slowly slide your foot back down until your leg is flat on the floor again.  Angel Wings: Lying on your back spread your legs to the side as far apart as you can without causing discomfort.  Hamstring Strength:  Lying on your back, push your heel against the floor with your leg straight by tightening up the muscles of your buttocks.  Repeat,  but this time bend your knee to a comfortable angle, and push your heel against the floor.  You may put a pillow under the heel to make it more comfortable if necessary.   A rehabilitation program following joint replacement surgery can speed recovery and prevent re-injury in the future due to weakened muscles. Contact your doctor or a physical therapist for more information on knee rehabilitation.    CONSTIPATION  Constipation is defined medically as fewer than three stools per week and severe constipation as less than one stool per week.  Even if  you have a regular bowel pattern at home, your normal regimen is likely to be disrupted due to multiple reasons following surgery.  Combination of anesthesia, postoperative narcotics, change in appetite and fluid intake all can affect your bowels.   YOU MUST use at least one of the following options; they are listed in order of increasing strength to get the job done.  They are all available over the counter, and you may need to use some, POSSIBLY even all of these options:    Drink plenty of fluids (prune juice may be helpful) and high fiber foods Colace 100 mg by mouth twice a day  Senokot for constipation as directed and as needed Dulcolax (bisacodyl), take with full glass of water  Miralax (polyethylene glycol) once or twice a day as needed.  If you have tried all these things and are unable to have a bowel movement in the first 3-4 days after surgery call either your surgeon or your primary doctor.    If you experience loose stools or diarrhea, hold the medications until you stool forms back up.  If your symptoms do not get better within 1 week or if they get worse, check with your doctor.  If you experience "the worst abdominal pain ever" or develop nausea or vomiting, please contact the office immediately for further recommendations for treatment.   ITCHING:  If you experience itching with your medications, try taking only a single pain pill,  or even half a pain pill at a time.  You can also use Benadryl over the counter for itching or also to help with sleep.   TED HOSE STOCKINGS:  Use stockings on both legs until for at least 2 weeks or as directed by physician office. They may be removed at night for sleeping.  MEDICATIONS:  See your medication summary on the "After Visit Summary" that nursing will review with you.  You may have some home medications which will be placed on hold until you complete the course of blood thinner medication.  It is important for you to complete the blood thinner medication as prescribed.  PRECAUTIONS:  If you experience chest pain or shortness of breath - call 911 immediately for transfer to the hospital emergency department.   If you develop a fever greater that 101 F, purulent drainage from wound, increased redness or drainage from wound, foul odor from the wound/dressing, or calf pain - CONTACT YOUR SURGEON.                                                   FOLLOW-UP APPOINTMENTS:  If you do not already have a post-op appointment, please call the office for an appointment to be seen by your surgeon.  Guidelines for how soon to be seen are listed in your "After Visit Summary", but are typically between 1-4 weeks after surgery.  OTHER INSTRUCTIONS:   Knee Replacement:  Do not place pillow under knee, focus on keeping the knee straight while resting. CPM instructions: 0-90 degrees, 2 hours in the morning, 2 hours in the afternoon, and 2 hours in the evening. Place foam block, curve side up under heel at all times except when in CPM or when walking.  DO NOT modify, tear, cut, or change the foam block in any way.   MAKE SURE YOU:  Understand these instructions.  Get  help right away if you are not doing well or get worse.    Thank you for letting us be a part of your medical care team.  It is a privilege we respect greatly.  We hope these instructions will help you stay on track for a fast and full  recovery!   Diagnostic Studies: DG Lumbar Spine 2-3 Views  Result Date: 05/26/2021 CLINICAL DATA:  L4-L5 OLIF with pedicle screws EXAM: LUMBAR SPINE - 2-3 VIEW COMPARISON:  Preoperative lumbar radiographs 06/20/2017, lumbar spine MRI 07/20/2017 FINDINGS: To C-arm fluoroscopic images were obtained intraoperatively and submitted for post operative interpretation. Postsurgical changes reflecting posterior instrumented fusion at L4-L5 with interbody spacer are seen. Grade 1 anterolisthesis of L4 on L5 is unchanged. There is no evidence of immediate complication. Fluoro time 4 minutes 46 seconds. Please see the performing provider's procedural report for further detail. IMPRESSION: Status post L4-L5 posterior insert fusion with interbody spacer without evidence of immediate complication. Electronically Signed   By: Valetta Mole M.D.   On: 05/26/2021 14:45   DG C-Arm 1-60 Min-No Report  Result Date: 05/26/2021 Fluoroscopy was utilized by the requesting physician.  No radiographic interpretation.   DG C-Arm 1-60 Min-No Report  Result Date: 05/26/2021 Fluoroscopy was utilized by the requesting physician.  No radiographic interpretation.   DG C-Arm 1-60 Min-No Report  Result Date: 05/26/2021 Fluoroscopy was utilized by the requesting physician.  No radiographic interpretation.   DG OR LOCAL ABDOMEN  Result Date: 05/26/2021 CLINICAL DATA:  Status post OLIF L4-5. Evaluate for retained instruments. EXAM: OR LOCAL ABDOMEN COMPARISON:  None. FINDINGS: No retained surgical instruments identified within the imaged portions of the abdomen. Postsurgical changes from L4-5 interbody fusion noted. A few scattered surgical clips and staples are identified. Soft tissue gas is noted within the left hemipelvis and left lower quadrant of the abdomen. IMPRESSION: No retained surgical instruments identified. These results were called by telephone at the time of interpretation on 05/26/2021 at 10:28 am to provider  [place] in [place] room for at [facility], who verbally acknowledged these results. Electronically Signed   By: Kerby Moors M.D.   On: 05/26/2021 10:28    Disposition: Discharge disposition: 01-Home or Self Care       Discharge Instructions     Call MD / Call 911   Complete by: As directed    If you experience chest pain or shortness of breath, CALL 911 and be transported to the hospital emergency room.  If you develope a fever above 101 F, pus (white drainage) or increased drainage or redness at the wound, or calf pain, call your surgeon's office.   Constipation Prevention   Complete by: As directed    Drink plenty of fluids.  Prune juice may be helpful.  You may use a stool softener, such as Colace (over the counter) 100 mg twice a day.  Use MiraLax (over the counter) for constipation as needed.   Diet - low sodium heart healthy   Complete by: As directed    Driving restrictions   Complete by: As directed    No driving for 6 weeks   Incentive spirometry RT   Complete by: As directed    Increase activity slowly as tolerated   Complete by: As directed    Lifting restrictions   Complete by: As directed    No lifting for 6 weeks   Post-operative opioid taper instructions:   Complete by: As directed    POST-OPERATIVE OPIOID TAPER INSTRUCTIONS: It  is important to wean off of your opioid medication as soon as possible. If you do not need pain medication after your surgery it is ok to stop day one. Opioids include: Codeine, Hydrocodone(Norco, Vicodin), Oxycodone(Percocet, oxycontin) and hydromorphone amongst others.  Long term and even short term use of opiods can cause: Increased pain response Dependence Constipation Depression Respiratory depression And more.  Withdrawal symptoms can include Flu like symptoms Nausea, vomiting And more Techniques to manage these symptoms Hydrate well Eat regular healthy meals Stay active Use relaxation techniques(deep breathing, meditating,  yoga) Do Not substitute Alcohol to help with tapering If you have been on opioids for less than two weeks and do not have pain than it is ok to stop all together.  Plan to wean off of opioids This plan should start within one week post op of your joint replacement. Maintain the same interval or time between taking each dose and first decrease the dose.  Cut the total daily intake of opioids by one tablet each day Next start to increase the time between doses. The last dose that should be eliminated is the evening dose.           Follow-up Information     Melina Schools, MD. Schedule an appointment as soon as possible for a visit in 2 week(s).   Specialty: Orthopedic Surgery Why: If symptoms worsen, For suture removal, For wound re-check Contact information: [address] [place] [address] [phone]                  Signed: Dorothyann Peng 05/30/2021, 10:31 AM

## 2021-05-31 ENCOUNTER — Ambulatory Visit (HOSPITAL_BASED_OUTPATIENT_CLINIC_OR_DEPARTMENT_OTHER): Payer: Medicare Other | Admitting: Physical Therapy

## 2021-06-01 MED FILL — Sodium Chloride IV Soln 0.9%: INTRAVENOUS | Qty: 1000 | Status: AC

## 2021-06-01 MED FILL — Heparin Sodium (Porcine) Inj 1000 Unit/ML: INTRAMUSCULAR | Qty: 30 | Status: AC

## 2021-06-03 ENCOUNTER — Ambulatory Visit (HOSPITAL_BASED_OUTPATIENT_CLINIC_OR_DEPARTMENT_OTHER): Payer: Medicare Other | Admitting: Physical Therapy

## 2021-06-07 ENCOUNTER — Ambulatory Visit (HOSPITAL_BASED_OUTPATIENT_CLINIC_OR_DEPARTMENT_OTHER): Payer: Medicare Other | Admitting: Physical Therapy

## 2021-06-10 ENCOUNTER — Ambulatory Visit (HOSPITAL_BASED_OUTPATIENT_CLINIC_OR_DEPARTMENT_OTHER): Payer: Medicare Other | Admitting: Physical Therapy

## 2021-09-26 ENCOUNTER — Telehealth: Payer: Self-pay | Admitting: Cardiovascular Disease

## 2021-09-26 ENCOUNTER — Ambulatory Visit: Payer: Medicare Other | Admitting: Cardiovascular Disease

## 2021-09-26 NOTE — Telephone Encounter (Signed)
Spoke with pt's wife and pt has recovered from [place] Per wife was told that it is possible for pt's to develop a cognitive impairment with this Encouraged wife to call PCP or Neuro for direction on this  Pt's wife also cx appt for today due to this  Appt rescheduled with Ermalinda Barrios PA  for 2/27 at 1:15 pm Will let Dr Burt Knack know .Adonis Housekeeper

## 2021-09-26 NOTE — Telephone Encounter (Signed)
Agree. thanks

## 2021-09-26 NOTE — Telephone Encounter (Signed)
Patient had short spell today of confusion after vomiting today.  The patient and his wife are both 8 days symptom free from having COVID. The wife states that while the patient had COVID he would wake up and be confused about how to do routine tasks( ie-check his blood sugar). The wife said it would take about an hour to get back to normal from a confused state.   The wife is concerned because he had another spell of confusion today. He was ready to go to his appt but got sick all of a sudden. The wife said as soon as he decided to not come to his appointment he went back to bed.   She is not sure what to do

## 2021-09-27 NOTE — Telephone Encounter (Signed)
Pt's wife aware Dr Burt Knack agrees with plan ./cy

## 2021-09-28 NOTE — Progress Notes (Unsigned)
Cardiology Office Note    Date:  09/28/2021   ID:  Devin Combs, DOB [birth year], MRN [id number]   PCP:  Maury Dus, [place]  Cardiologist:  Sherren Mocha, MD *** Advanced Practice Provider:  No care team member to display Electrophysiologist:  None   [phone]   No chief complaint on file.   History of Present Illness:  Devin Combs is a 71 y.o. male with history of CAD status post anterior STEMI 2020 treated with DES to the LAD, ischemic cardiomyopathy ejection fraction 35% but follow-up echo normal LV function, hypertension, HLD, DM 2, OSA on CPAP, peripheral neuropathy.  Patient last saw Dr. Burt Knack 05/26/2020 and was doing well other than back pain.  Patient saw Dr. Caryl Comes 03/24/2021 for orthostatic hypotension but was asymptomatic and was cleared for surgery    Past Medical History:  Diagnosis Date   Acute systolic heart failure ([place])    Anemia    Anxiety    Arthritis    Cancer ([place])    basal and squamous cell carcinoma   CHF (congestive heart failure) (HCC)    Chronic back pain    DDD (degenerative disc disease), thoracolumbar    Degenerative cervical spinal stenosis    Depression    Diabetic autonomic neuropathy (H[facility])    Dyspnea    with exertion    GERD (gastroesophageal reflux disease)    Headache    History of bronchitis    History of kidney stones    Hyperlipidemia    Hypertension    Hypogonadism in male    Myocardial infarction [facility])    Neuropathy    NSTEMI (non-ST elevated myocardial infarction) ([place])    10/29/2018- PCI/DESx1 to pLAD, EF 35%   Orthostatic hypotension    OSA (obstructive sleep apnea)    per pt study done 2006 (approx)  used cpap few yrs then stopped using, stated didn't feel he needed it anymore   Psoriasis    Retinal tear    Right knee meniscal tear    Type 2 diabetes mellitus ([place])    Urinary hesitancy    Vitamin D deficiency     Past Surgical History:  Procedure Laterality Date    ABDOMINAL EXPOSURE Left 05/26/2021   Procedure: ABDOMINAL EXPOSURE;  Surgeon: Marty Heck, MD;  Location: [facility];  Service: Vascular;  Laterality: Left;   CATARACT EXTRACTION W/ INTRAOCULAR LENS  IMPLANT, BILATERAL  right 2015//  left 2012   CORONARY/GRAFT ACUTE MI REVASCULARIZATION N/A 10/30/2018   Procedure: Coronary/Graft Acute MI Revascularization;  Surgeon: Troy Sine, MD;  Location: [facility];  Service: Cardiovascular;  Laterality: N/A;   CORRECTION REPAIR MULTIPLE  TOES , LEFT FOOT  YRS AGO   EXTRACORPOREAL SHOCK WAVE LITHOTRIPSY  2000 approx   KNEE ARTHROSCOPY Left 2007   KNEE ARTHROSCOPY Right 02/10/2015   Procedure: ARTHROSCOPY RIGHT KNEE WITH DEBRIDEMENT AND CHRONDROPLASTY;  Surgeon: Gaynelle Arabian, MD;  Location: [facility];  Service: Orthopedics;  Laterality: Right;   LEFT HEART CATH AND CORONARY ANGIOGRAPHY N/A 10/30/2018   Procedure: LEFT HEART CATH AND CORONARY ANGIOGRAPHY;  Surgeon: Troy Sine, MD;  Location: [facility];  Service: Cardiovascular;  Laterality: N/A;   OBLIQUE LUMBAR INTERBODY FUSION 1 LEVEL WITH PERCUTANEOUS SCREWS Left 05/26/2021   Procedure: OBLIQUE LUMBAR INTERBODY FUSION 1 LEVEL WITH PERCUTANEOUS SCREWS (OLIF L4-5 WITH POSTERIOR SPINAL FUSION INTERBODY);  Surgeon: Melina Schools, MD;  Location: [facility];  Service: Orthopedics;  Laterality: Left;  LEFT TAP BLOCK WITH EXPAREL   PARTIAL KNEE ARTHROPLASTY Right 07/19/2016   Procedure: RIGHT KNEE MEDICAL UNICOMPARTMENTAL ARTHROPLASTY;  Surgeon: Gaynelle Arabian, MD;  Location: [facility];  Service: Orthopedics;  Laterality: Right;   RADICAL ORCHIECTOMY Left 1996   benign   REMOVAL SPURS RIGHT GREAT TOE  YRS AGO   TOTAL KNEE ARTHROPLASTY Left 09-16-2007    Current Medications: No outpatient medications have been marked as taking for the 10/10/21 encounter (Appointment) with Imogene Burn, PA-C.     Allergies:   Patient has no known allergies.   Social History    Socioeconomic History   Marital status: Married    Spouse name: Not on file   Number of children: 1   Years of education: Not on file   Highest education level: Some college, no degree  Occupational History   Not on file  Tobacco Use   Smoking status: Never   Smokeless tobacco: Never  Vaping Use   Vaping Use: Never used  Substance and Sexual Activity   Alcohol use: Yes    Comment: rare   Drug use: No   Sexual activity: Not on file  Other Topics Concern   Not on file  Social History Narrative   Lives at home with wife, who is Therapist, sports.  Pt is retired.  Caffeine 2-3 cups per week. Right handed.    Social Determinants of Health   Financial Resource Strain: Not on file  Food Insecurity: Not on file  Transportation Needs: Not on file  Physical Activity: Not on file  Stress: Not on file  Social Connections: Not on file     Family History:  The patient's ***family history is not on file.   ROS:   Please see the history of present illness.    ROS All other systems reviewed and are negative.   PHYSICAL EXAM:   VS:  There were no vitals taken for this visit.  Physical Exam  GEN: Well nourished, well developed, in no acute distress  HEENT: normal  Neck: no JVD, carotid bruits, or masses Cardiac:RRR; no murmurs, rubs, or gallops  Respiratory:  clear to auscultation bilaterally, normal work of breathing GI: soft, nontender, nondistended, + BS Ext: without cyanosis, clubbing, or edema, Good distal pulses bilaterally MS: no deformity or atrophy  Skin: warm and dry, no rash Neuro:  Alert and Oriented x 3, Strength and sensation are intact Psych: euthymic mood, full affect  Wt Readings from Last 3 Encounters:  05/24/21 244 lb (110.7 kg)  05/19/21 245 lb 12.8 oz (111.5 kg)  03/24/21 246 lb (111.6 kg)      Studies/Labs Reviewed:   EKG:  EKG is*** ordered today.  The ekg ordered today demonstrates ***  Recent Labs: 05/19/2021: BUN 16; Creatinine, Ser 1.15; Hemoglobin 15.2;  Platelets 271; Potassium 4.0; Sodium 140   Lipid Panel    Component Value Date/Time   CHOL 216 (H) 10/30/2018 0257   TRIG 133 10/30/2018 0257   HDL 36 (L) 10/30/2018 0257   CHOLHDL 6.0 10/30/2018 0257   VLDL 27 10/30/2018 0257   LDLCALC 153 (H) 10/30/2018 0257    Additional studies/ records that were reviewed today include:  Echo 02-23-2020: IMPRESSIONS     1. Since the last study on 10/30/2018 LVEF has improved from 35-40% to  55-60%.   2. Left ventricular ejection fraction, by estimation, is 55 to 60%. The  left ventricle has normal function. The left ventricle has no regional  wall motion abnormalities.  There is mild concentric left ventricular  hypertrophy. Left ventricular diastolic  parameters are consistent with Grade I diastolic dysfunction (impaired  relaxation). Elevated left atrial pressure.   3. Right ventricular systolic function is normal. The right ventricular  size is normal.   4. Left atrial size was moderately dilated.   5. The mitral valve is normal in structure. Mild mitral valve  regurgitation. No evidence of mitral stenosis.   6. The aortic valve is normal in structure. Aortic valve regurgitation is  not visualized. Mild to moderate aortic valve sclerosis/calcification is  present, without any evidence of aortic stenosis.   7. The inferior vena cava is normal in size with greater than 50%  respiratory variability, suggesting right atrial pressure of 3 mmHg.    Cardiac Cath 10-30-2018: Conclusion     1st Diag lesion is 70% stenosed. Prox LAD lesion is 100% stenosed. Prox Cx lesion is 10% stenosed. Dist Cx lesion is 20% stenosed. Prox RCA to Mid RCA lesion is 10% stenosed. Post Atrio lesion is 50% stenosed. Mid LAD lesion is 95% stenosed. Post intervention, there is a 0% residual stenosis. A drug-eluting stent was successfully placed. Post intervention, there is a 0% residual stenosis. LV end diastolic pressure is mildly elevated. There is mild to  moderate left ventricular systolic dysfunction. A stent was successfully placed.   Acute coronary syndrome secondary to total occlusion of the very proximal LAD immediately after a takeoff of a very proximal, near ostial diagonal vessel with initial TIMI 0 flow.   Mild concomitant CAD with 10% mid and 20% distal circumflex stenoses and 10% mid RCA stenosis with 50% distal focal narrowing in the continuation branch prior to the PLA takeoff.  There were minimal collaterals to the LAD.   Mild to moderate acute LV dysfunction with an EF of 40 to 45% and hypocontractility involving the mid distal anterolateral wall and apex.  LVEDP 20 mmHg.   Difficult but successful percutaneous coronary intervention of the very proximal LAD occlusion with ultimate insertion of a 3.0 x 38 mm Resolute Onyx DES stent at the site of very proximal total occlusion and insertion of a 2.5 x 22 mm Resolute stent in the mid LAD with the 95% stenosis being reduced to 0%.  There was initially no flow phenomena.  The patient was treated with Aggrastat infusion in addition to bivalirudin and Brilinta and received numerous doses of intracoronary nitroglycerin.   RECOMMENDATION: DAPT for minimum of 1 year.  We will cycle troponins.  Will admit the patient to to heart following his catheterization.  Optimal blood pressure control with ideal blood pressure less than 120/80.  Aggressive high potency lipid lowering therapy with target LDL less than 70.        Risk Assessment/Calculations:   {Does this patient have ATRIAL FIBRILLATION?[phone]     ASSESSMENT:    No diagnosis found.   PLAN:  In order of problems listed above:  CAD status post anterior STEMI 2020 treated with DES to the LAD  Ischemic cardiomyopathy ejection fraction 35% follow-up echo EF normalized  Hypertension with history of orthostatic hypotension HLD  DM 2 with autonomic neuropathy  OSA on CPAP   Shared Decision Making/Informed Consent    {Are you ordering a CV Procedure (e.g. stress test, cath, DCCV, TEE, etc)?   Press F2        :[id number]}    Medication Adjustments/Labs and Tests Ordered: Current medicines are reviewed at length with the patient today.  Concerns regarding medicines are  outlined above.  Medication changes, Labs and Tests ordered today are listed in the Patient Instructions below. There are no Patient Instructions on file for this visit.   Sumner Boast, PA-C  09/28/2021 2:34 PM    [facility] [place], [place], [place]  [address] Phone: [phone]; Fax: [phone]

## 2021-09-30 ENCOUNTER — Other Ambulatory Visit: Payer: Self-pay | Admitting: Cardiovascular Disease

## 2021-10-10 ENCOUNTER — Ambulatory Visit: Payer: Medicare Other | Admitting: Physician Assistant

## 2021-10-10 DIAGNOSIS — E0800 Diabetes mellitus due to underlying condition with hyperosmolarity without nonketotic hyperglycemic-hyperosmolar coma (NKHHC): Secondary | ICD-10-CM

## 2021-10-10 DIAGNOSIS — I10 Essential (primary) hypertension: Secondary | ICD-10-CM

## 2021-10-10 DIAGNOSIS — I255 Ischemic cardiomyopathy: Secondary | ICD-10-CM

## 2021-10-10 DIAGNOSIS — I2510 Atherosclerotic heart disease of native coronary artery without angina pectoris: Secondary | ICD-10-CM

## 2021-12-23 ENCOUNTER — Other Ambulatory Visit: Payer: Self-pay

## 2021-12-23 ENCOUNTER — Emergency Department (HOSPITAL_COMMUNITY): Payer: Medicare Other

## 2021-12-23 ENCOUNTER — Encounter (HOSPITAL_COMMUNITY): Payer: Self-pay

## 2021-12-23 ENCOUNTER — Emergency Department (HOSPITAL_COMMUNITY)
Admission: EM | Admit: 2021-12-23 | Discharge: 2021-12-24 | Disposition: A | Discharge status: Home / Self Care | Payer: Medicare Other | Attending: Emergency Medicine | Admitting: Emergency Medicine

## 2021-12-23 DIAGNOSIS — I951 Orthostatic hypotension: Secondary | ICD-10-CM | POA: Insufficient documentation

## 2021-12-23 DIAGNOSIS — Z7982 Long term (current) use of aspirin: Secondary | ICD-10-CM | POA: Diagnosis not present

## 2021-12-23 DIAGNOSIS — Z20822 Contact with and (suspected) exposure to covid-19: Secondary | ICD-10-CM | POA: Insufficient documentation

## 2021-12-23 DIAGNOSIS — Z7984 Long term (current) use of oral hypoglycemic drugs: Secondary | ICD-10-CM | POA: Diagnosis not present

## 2021-12-23 DIAGNOSIS — R4189 Other symptoms and signs involving cognitive functions and awareness: Secondary | ICD-10-CM

## 2021-12-23 DIAGNOSIS — R5383 Other fatigue: Secondary | ICD-10-CM | POA: Diagnosis present

## 2021-12-23 DIAGNOSIS — G6289 Other specified polyneuropathies: Secondary | ICD-10-CM | POA: Insufficient documentation

## 2021-12-23 DIAGNOSIS — E119 Type 2 diabetes mellitus without complications: Secondary | ICD-10-CM | POA: Diagnosis not present

## 2021-12-23 DIAGNOSIS — M6281 Muscle weakness (generalized): Secondary | ICD-10-CM | POA: Diagnosis present

## 2021-12-23 DIAGNOSIS — R419 Unspecified symptoms and signs involving cognitive functions and awareness: Secondary | ICD-10-CM | POA: Diagnosis not present

## 2021-12-23 HISTORY — DX: COVID-19: U07.1

## 2021-12-23 LAB — CBC
HCT: 44.6 % (ref 39.0–52.0)
Hemoglobin: 14.4 g/dL (ref 13.0–17.0)
MCH: 29.3 pg (ref 26.0–34.0)
MCHC: 32.3 g/dL (ref 30.0–36.0)
MCV: 90.8 fL (ref 80.0–100.0)
Platelets: 292 10*3/uL (ref 150–400)
RBC: 4.91 MIL/uL (ref 4.22–5.81)
RDW: 14.3 % (ref 11.5–15.5)
WBC: 7.9 10*3/uL (ref 4.0–10.5)
nRBC: 0 % (ref 0.0–0.2)

## 2021-12-23 LAB — BASIC METABOLIC PANEL
Anion gap: 9 (ref 5–15)
BUN: 19 mg/dL (ref 8–23)
CO2: 25 mmol/L (ref 22–32)
Calcium: 9.5 mg/dL (ref 8.9–10.3)
Chloride: 103 mmol/L (ref 98–111)
Creatinine, Ser: 1.03 mg/dL (ref 0.61–1.24)
GFR, Estimated: >60 mL/min (ref >60)
Glucose, Bld: 116 mg/dL — ABNORMAL HIGH (ref 70–99)
Potassium: 3.8 mmol/L (ref 3.5–5.1)
Sodium: 137 mmol/L (ref 135–145)

## 2021-12-23 LAB — TROPONIN I (HIGH SENSITIVITY): Troponin I (High Sensitivity): 8 ng/L (ref <18)

## 2021-12-23 NOTE — ED Provider Triage Note (Signed)
Emergency Medicine Provider Triage Evaluation Note ? ?Devin Combs , a 71 y.o. male  was evaluated in triage.  Pt complains of increasing global weakness over the last 5 months, especially over the last week.  Weakness of the bilateral lower legs is the worst per patient.  Mild decreased sensation of the feet that has been increasing over the last few months, with history of DMT2.  Hx of MI 2-3 months ago.  Denies chest pain, shortness of breath, fever, recent upper respiratory infection, abdominal pain, constipation, nausea or vomiting.  Hx of hypertension, hyperlipidemia, GERD, orthostatic hypotension, CHF, NSTEMI, anemia. ? ?Review of Systems  ?Positive: As above ?Negative: As above ? ?Physical Exam  ?BP 107/84 (BP Location: Left Arm)   Pulse 74   Temp 98.4 ?F (36.9 ?C) (Oral)   Resp 16   Ht 6' (1.829 m)   Wt 110.7 kg   SpO2 96%   BMI 33.10 kg/m?  ?Gen:   Awake, no distress, sitting comfortably ?Resp:  Normal effort, CTAB ?MSK:   Moves extremities without difficulty  ?Other:  1+ pulses DP and PT bilaterally.  2+ radial pulses bilaterally. ? ?Medical Decision Making  ?Medically screening exam initiated at 6:52 PM.  Appropriate orders placed.  Devin Combs was informed that the remainder of the evaluation will be completed by another provider, this initial triage assessment does not replace that evaluation, and the importance of remaining in the ED until their evaluation is complete. ? ?Labs, imaging, EKG ordered. ?  ?Noboru, Bidinger, PA-C ?90/30/09 1912 ? ?

## 2021-12-23 NOTE — ED Triage Notes (Signed)
Pt arrived via [facility] from home. Pt c/o generalized weaknessx64mo. Ast 2 wks it has increased. Pt saw PCP and they told him to come here to get evaluated. Vomitingx3 days ago. ?

## 2021-12-24 ENCOUNTER — Emergency Department (HOSPITAL_COMMUNITY): Payer: Medicare Other

## 2021-12-24 ENCOUNTER — Encounter (HOSPITAL_COMMUNITY): Payer: Self-pay | Admitting: Emergency Medicine

## 2021-12-24 DIAGNOSIS — I951 Orthostatic hypotension: Secondary | ICD-10-CM | POA: Diagnosis not present

## 2021-12-24 LAB — URINALYSIS, ROUTINE W REFLEX MICROSCOPIC
Bilirubin Urine: NEGATIVE
Glucose, UA: NEGATIVE mg/dL
Hgb urine dipstick: NEGATIVE
Ketones, ur: NEGATIVE mg/dL
Leukocytes,Ua: NEGATIVE
Nitrite: NEGATIVE
Protein, ur: NEGATIVE mg/dL
Specific Gravity, Urine: 1.026 (ref 1.005–1.030)
pH: 5 (ref 5.0–8.0)

## 2021-12-24 LAB — RESP PANEL BY RT-PCR (FLU A&B, COVID) ARPGX2
Influenza A by PCR: NEGATIVE
Influenza B by PCR: NEGATIVE
SARS Coronavirus 2 by RT PCR: NEGATIVE

## 2021-12-24 LAB — TROPONIN I (HIGH SENSITIVITY): Troponin I (High Sensitivity): 7 ng/L (ref <18)

## 2021-12-24 MED ORDER — SODIUM CHLORIDE 0.9 % IV BOLUS
500.0000 mL | Freq: Once | INTRAVENOUS | Status: AC
Start: 2021-12-24 — End: 2021-12-24
  Administered 2021-12-24: 500 mL via INTRAVENOUS

## 2021-12-24 NOTE — Discharge Instructions (Addendum)
1)Raising the head of the bed 30 to 45 degrees decreases renal perfusion, thereby activating the renin-angiotensin-aldosterone system and decreasing nocturnal diuresis, which can be pronounced in these patients.  These changes relieve orthostatic hypotension by expanding extracellular fluid volume and may reduce end organ damage by reducing supine HTN.  ?2) Exercise - walking 30 minutes a day, exercise in a swimming pool, exercise in a recumbent or seated position (using a stationary bike or rowing machine)  ?3) Abdominal binders   ?4) Compression Stockings to the level of the abdomen  ?5) Increased salt and water intake to 2 L to 2.5 L of water a day  ?6) Modification of meals:  ?- Avoiding large meals  ?- Ingesting meals low in carbohydrates  ?- Alcohol should be avoided during the day as it is a vasodilator  ?- Drink water with meals  ?- Avoiding activities or sudden standing immediately after eating  ?7) Physical Countermaneuvers during daily activities: leg crossing, standing on tip toes, squatting  ?

## 2021-12-24 NOTE — ED Provider Notes (Addendum)
?[facility] ?Provider Note ? ? ?CSN: [id number] ?Arrival date & time: 12/23/21  1814 ? ?  ? ?History ? ?Chief Complaint  ?Patient presents with  ? Fatigue  ? ? ?Devin Combs is a 71 y.o. male. ? ?The history is provided by the patient and the spouse.  ?Illness ?Location:  Neuropathy and weakness and brain fog and changes in volume of speech ?Severity:  Moderate ?Onset quality:  Gradual ?Duration:  96 months ?Timing:  Constant ?Progression:  Worsening (waxing and waning better with PT but overall worsening and markedly worse since covid diagnosis) ?Context:  Diabetic neuropathy and dysautonomia ?Relieved by:  Nothing ?Worsened by:  Nothing ?Ineffective treatments:  None ?Associated symptoms: no chest pain, no cough, no diarrhea, no fever, no rash, no rhinorrhea, no shortness of breath and no wheezing   ?Risk factors:  DM, dysautonomia and h/o brain fog seen for same in 2-18,2019 by [facility] neurology ?Patient has been bed bound for some time and wife is concerned his overall picture is worse for the past 5 months.   ?  ? ?Home Medications ?Prior to Admission medications   ?Medication Sig Start Date End Date Taking? Authorizing Provider  ?acetaminophen (TYLENOL) 500 MG tablet Take 1,000 mg by mouth every 6 (six) hours as needed for mild pain.   Yes [provider]  ?amphetamine-dextroamphetamine (ADDERALL XR) 30 MG 24 hr capsule Take 30 mg by mouth daily. 12/17/19  Yes [provider]  ?Apoaequorin 10 MG CAPS Take 10 mg by mouth daily.    Yes [provider]  ?aspirin EC 81 MG EC tablet Take 1 tablet (81 mg total) by mouth daily. 11/02/18  Yes Reino Bellis B, NP  ?atorvastatin (LIPITOR) 80 MG tablet TAKE 1 TABLET BY MOUTH EVERY DAY AT 6PM ?Patient taking differently: Take 80 mg by mouth daily. 08/04/19  Yes Reino Bellis B, NP  ?buPROPion (WELLBUTRIN XL) 300 MG 24 hr tablet Take 300 mg by mouth daily. 09/08/18  Yes [provider]   ?carvedilol (COREG) 6.25 MG tablet TAKE 1 TABLET (6.25 MG TOTAL) BY MOUTH 2 (TWO) TIMES DAILY WITH A MEAL. 11/01/20  Yes Sherren Mocha, MD  ?Cholecalciferol (QC VITAMIN D3) 50 MCG (2000 UT) TABS Take 2,000 Units by mouth daily.   Yes [provider]  ?Cyanocobalamin (VITAMIN B-12) 1000 MCG SUBL Place 1,000 mcg under the tongue daily.   Yes [provider]  ?DULoxetine (CYMBALTA) 60 MG capsule Take 60 mg by mouth every evening.   Yes [provider]  ?Epinastine HCl 0.05 % ophthalmic solution Place 1 drop into both eyes 2 (two) times daily as needed (allergies). 04/21/21  Yes [provider]  ?fentaNYL (DURAGESIC) 75 MCG/HR Place 1 patch onto the skin every 3 (three) days. 01/26/20  Yes [provider]  ?folic acid (FOLVITE) 1 MG tablet Take 1 mg by mouth daily. 08/10/18  Yes [provider]  ?furosemide (LASIX) 20 MG tablet TAKE 1 TABLET BY MOUTH THREE TIMES A DAY AS NEEDED BETWEEN MEALS AND AT BEDTIME ?Patient taking differently: Take 20 mg by mouth daily as needed for fluid or edema. 09/23/19  Yes Bhagat, Bhavinkumar, PA  ?gabapentin (NEURONTIN) 600 MG tablet Take 600 mg by mouth daily as needed (pain). 04/28/21  Yes [provider]  ?HYDROcodone-acetaminophen (NORCO) 10-325 MG tablet Take 1 tablet by mouth 4 (four) times daily as needed for moderate pain.   Yes [provider]  ?Hyoscyamine Sulfate SL 0.125 MG SUBL Place 0.125  mg under the tongue 2 (two) times daily as needed (diarrhea).   Yes [provider]  ?ipratropium (ATROVENT) 0.03 % nasal spray Place 2 sprays into both nostrils 3 (three) times daily as needed for rhinitis. 04/20/21  Yes [provider]  ?losartan (COZAAR) 50 MG tablet Take 1 tablet (50 mg total) by mouth daily. Please keep upcoming appt in February 2023 with Dr. Burt Knack before anymore refills. Thank you Final Attempt 05/27/21  Yes Sherren Mocha, MD  ?metFORMIN (GLUCOPHAGE) 1000 MG tablet Take 1,000 mg by  mouth 2 (two) times daily with a meal.   Yes [provider]  ?nitroGLYCERIN (NITROSTAT) 0.4 MG SL tablet Place 1 tablet (0.4 mg total) under the tongue every 5 (five) minutes x 3 doses as needed for chest pain. 11/01/18  Yes Cheryln Manly, NP  ?ondansetron (ZOFRAN) 4 MG tablet Take 1 tablet (4 mg total) by mouth every 8 (eight) hours as needed for nausea or vomiting. 05/27/21  Yes Melina Schools, MD  ?pioglitazone (ACTOS) 30 MG tablet Take 30 mg by mouth every morning. 11/28/19  Yes [provider]  ?TRULICITY 1.5 CV/8.9FY SOPN Inject 0.5 mLs into the skin every Sunday. 09/19/18  Yes [provider]  ?tamsulosin (FLOMAX) 0.4 MG CAPS capsule Take 1 capsule (0.4 mg total) by mouth daily after breakfast. ?Patient not taking: Reported on 12/24/2021 05/27/21   Melina Schools, MD  ?   ? ?Allergies    ?Patient has no known allergies.   ? ?Review of Systems   ?Review of Systems  ?Constitutional:  Negative for fever.  ?HENT:  Negative for rhinorrhea.   ?Eyes:  Negative for redness.  ?Respiratory:  Negative for cough, shortness of breath and wheezing.   ?Cardiovascular:  Negative for chest pain.  ?Gastrointestinal:  Negative for diarrhea.  ?Musculoskeletal:  Negative for neck stiffness.  ?Skin:  Negative for rash.  ?Neurological:  Positive for weakness and numbness. Negative for facial asymmetry.  ?All other systems reviewed and are negative. ? ?Physical Exam ?Updated Vital Signs ?BP 122/75   Pulse 68   Temp 98 ?F (36.7 ?C)   Resp (!) 24   Ht 6' (1.829 m)   Wt 110.7 kg   SpO2 100%   BMI 33.10 kg/m?  ?Physical Exam ?Vitals and nursing note reviewed.  ?Constitutional:   ?   General: He is not in acute distress. ?   Appearance: Normal appearance.  ?HENT:  ?   Head: Normocephalic and atraumatic.  ?   Nose: Nose normal.  ?   Mouth/Throat:  ?   Mouth: Mucous membranes are moist.  ?   Pharynx: Oropharynx is clear.  ?Eyes:  ?   Extraocular Movements: Extraocular movements intact.  ?    Conjunctiva/sclera: Conjunctivae normal.  ?   Pupils: Pupils are equal, round, and reactive to light.  ?Cardiovascular:  ?   Rate and Rhythm: Normal rate and regular rhythm.  ?   Pulses: Normal pulses.  ?   Heart sounds: Normal heart sounds.  ?Pulmonary:  ?   Effort: Pulmonary effort is normal. No respiratory distress.  ?   Breath sounds: Normal breath sounds. No stridor. No wheezing, rhonchi or rales.  ?Chest:  ?   Chest wall: No tenderness.  ?Abdominal:  ?   General: Bowel sounds are normal.  ?   Palpations: Abdomen is soft.  ?   Tenderness: There is no abdominal tenderness. There is no guarding or rebound.  ?   Hernia: No hernia is present.  ?  Musculoskeletal:     ?   General: Normal range of motion.  ?   Cervical back: Normal range of motion and neck supple.  ?Skin: ?   General: Skin is warm and dry.  ?   Capillary Refill: Capillary refill takes less than 2 seconds.  ?Neurological:  ?   General: No focal deficit present.  ?   Mental Status: He is alert.  ?   Comments: DTR lower B 1+  ?Psychiatric:     ?   Mood and Affect: Mood normal.  ? ? ?ED Results / Procedures / Treatments   ?Labs ?(all labs ordered are listed, but only abnormal results are displayed) ?Results for orders placed or performed during the hospital encounter of 12/23/21  ?Resp Panel by RT-PCR (Flu A&B, Covid) Nasopharyngeal Swab  ? Specimen: Nasopharyngeal Swab; Nasopharyngeal(NP) swabs in vial transport medium  ?Result Value Ref Range  ? SARS Coronavirus 2 by RT PCR NEGATIVE NEGATIVE  ? Influenza A by PCR NEGATIVE NEGATIVE  ? Influenza B by PCR NEGATIVE NEGATIVE  ?Basic metabolic panel  ?Result Value Ref Range  ? Sodium 137 135 - 145 mmol/L  ? Potassium 3.8 3.5 - 5.1 mmol/L  ? Chloride 103 98 - 111 mmol/L  ? CO2 25 22 - 32 mmol/L  ? Glucose, Bld 116 (H) 70 - 99 mg/dL  ? BUN 19 8 - 23 mg/dL  ? Creatinine, Ser 1.03 0.61 - 1.24 mg/dL  ? Calcium 9.5 8.9 - 10.3 mg/dL  ? GFR, Estimated >60 >60 mL/min  ? Anion gap 9 5 - 15  ?CBC  ?Result Value Ref Range   ? WBC 7.9 4.0 - 10.5 K/uL  ? RBC 4.91 4.22 - 5.81 MIL/uL  ? Hemoglobin 14.4 13.0 - 17.0 g/dL  ? HCT 44.6 39.0 - 52.0 %  ? MCV 90.8 80.0 - 100.0 fL  ? MCH 29.3 26.0 - 34.0 pg  ? MCHC 32.3 30.0 - 36.0 g/dL  ? RDW 14.3 11.5 - 15

## 2021-12-24 NOTE — ED Notes (Signed)
Patient transported to CT 

## 2021-12-24 NOTE — ED Notes (Signed)
Called [facility] to transport patient to home address.  

## 2021-12-27 ENCOUNTER — Other Ambulatory Visit: Payer: Self-pay | Admitting: Cardiovascular Disease

## 2022-01-06 ENCOUNTER — Other Ambulatory Visit: Payer: Self-pay | Admitting: Cardiovascular Disease

## 2022-01-28 ENCOUNTER — Other Ambulatory Visit: Payer: Self-pay | Admitting: Cardiovascular Disease

## 2022-03-06 ENCOUNTER — Other Ambulatory Visit: Payer: Self-pay | Admitting: Cardiovascular Disease

## 2022-04-28 ENCOUNTER — Other Ambulatory Visit: Payer: Self-pay | Admitting: Cardiovascular Disease

## 2022-05-03 ENCOUNTER — Other Ambulatory Visit: Payer: Self-pay | Admitting: Cardiovascular Disease

## 2022-05-03 ENCOUNTER — Other Ambulatory Visit: Payer: Self-pay

## 2022-09-06 DIAGNOSIS — G9332 Myalgic encephalomyelitis/chronic fatigue syndrome: Secondary | ICD-10-CM | POA: Diagnosis not present

## 2022-10-07 DIAGNOSIS — G9332 Myalgic encephalomyelitis/chronic fatigue syndrome: Secondary | ICD-10-CM | POA: Diagnosis not present

## 2022-11-05 DIAGNOSIS — G9332 Myalgic encephalomyelitis/chronic fatigue syndrome: Secondary | ICD-10-CM | POA: Diagnosis not present

## 2022-12-06 DIAGNOSIS — G9332 Myalgic encephalomyelitis/chronic fatigue syndrome: Secondary | ICD-10-CM | POA: Diagnosis not present

## 2023-01-05 DIAGNOSIS — G9332 Myalgic encephalomyelitis/chronic fatigue syndrome: Secondary | ICD-10-CM | POA: Diagnosis not present

## 2023-01-22 DIAGNOSIS — E1140 Type 2 diabetes mellitus with diabetic neuropathy, unspecified: Secondary | ICD-10-CM | POA: Diagnosis not present

## 2023-01-22 DIAGNOSIS — E7800 Pure hypercholesterolemia, unspecified: Secondary | ICD-10-CM | POA: Diagnosis not present

## 2023-01-22 DIAGNOSIS — K580 Irritable bowel syndrome with diarrhea: Secondary | ICD-10-CM | POA: Diagnosis not present

## 2023-01-22 DIAGNOSIS — E1142 Type 2 diabetes mellitus with diabetic polyneuropathy: Secondary | ICD-10-CM | POA: Diagnosis not present

## 2023-01-22 DIAGNOSIS — I951 Orthostatic hypotension: Secondary | ICD-10-CM | POA: Diagnosis not present

## 2023-01-22 DIAGNOSIS — K219 Gastro-esophageal reflux disease without esophagitis: Secondary | ICD-10-CM | POA: Diagnosis not present

## 2023-01-22 DIAGNOSIS — E291 Testicular hypofunction: Secondary | ICD-10-CM | POA: Diagnosis not present

## 2023-01-22 DIAGNOSIS — I10 Essential (primary) hypertension: Secondary | ICD-10-CM | POA: Diagnosis not present

## 2023-01-22 DIAGNOSIS — U099 Post covid-19 condition, unspecified: Secondary | ICD-10-CM | POA: Diagnosis not present

## 2023-07-17 DIAGNOSIS — K580 Irritable bowel syndrome with diarrhea: Secondary | ICD-10-CM | POA: Diagnosis not present

## 2023-07-17 DIAGNOSIS — U099 Post covid-19 condition, unspecified: Secondary | ICD-10-CM | POA: Diagnosis not present

## 2023-07-17 DIAGNOSIS — E291 Testicular hypofunction: Secondary | ICD-10-CM | POA: Diagnosis not present

## 2023-07-17 DIAGNOSIS — I110 Hypertensive heart disease with heart failure: Secondary | ICD-10-CM | POA: Diagnosis not present

## 2023-07-17 DIAGNOSIS — E7800 Pure hypercholesterolemia, unspecified: Secondary | ICD-10-CM | POA: Diagnosis not present

## 2023-07-17 DIAGNOSIS — I429 Cardiomyopathy, unspecified: Secondary | ICD-10-CM | POA: Diagnosis not present

## 2023-07-17 DIAGNOSIS — I951 Orthostatic hypotension: Secondary | ICD-10-CM | POA: Diagnosis not present

## 2023-07-17 DIAGNOSIS — K219 Gastro-esophageal reflux disease without esophagitis: Secondary | ICD-10-CM | POA: Diagnosis not present

## 2023-07-17 DIAGNOSIS — E1142 Type 2 diabetes mellitus with diabetic polyneuropathy: Secondary | ICD-10-CM | POA: Diagnosis not present

## 2023-09-21 ENCOUNTER — Emergency Department (HOSPITAL_COMMUNITY): Payer: Medicare HMO

## 2023-09-21 ENCOUNTER — Inpatient Hospital Stay (HOSPITAL_COMMUNITY)
Admission: EM | Admit: 2023-09-21 | Discharge: 2023-10-05 | DRG: 682 | Disposition: A | Discharge status: Skilled Nursing Facility | Payer: Medicare HMO | Attending: Family Medicine | Admitting: Family Medicine

## 2023-09-21 ENCOUNTER — Other Ambulatory Visit: Payer: Self-pay

## 2023-09-21 DIAGNOSIS — E785 Hyperlipidemia, unspecified: Secondary | ICD-10-CM | POA: Diagnosis not present

## 2023-09-21 DIAGNOSIS — Z515 Encounter for palliative care: Secondary | ICD-10-CM

## 2023-09-21 DIAGNOSIS — F32A Depression, unspecified: Secondary | ICD-10-CM | POA: Diagnosis not present

## 2023-09-21 DIAGNOSIS — Z79899 Other long term (current) drug therapy: Secondary | ICD-10-CM

## 2023-09-21 DIAGNOSIS — R3911 Hesitancy of micturition: Secondary | ICD-10-CM | POA: Diagnosis present

## 2023-09-21 DIAGNOSIS — Z981 Arthrodesis status: Secondary | ICD-10-CM | POA: Diagnosis not present

## 2023-09-21 DIAGNOSIS — L89890 Pressure ulcer of other site, unstageable: Secondary | ICD-10-CM | POA: Diagnosis present

## 2023-09-21 DIAGNOSIS — Z9842 Cataract extraction status, left eye: Secondary | ICD-10-CM

## 2023-09-21 DIAGNOSIS — Z751 Person awaiting admission to adequate facility elsewhere: Secondary | ICD-10-CM

## 2023-09-21 DIAGNOSIS — R29898 Other symptoms and signs involving the musculoskeletal system: Secondary | ICD-10-CM

## 2023-09-21 DIAGNOSIS — L03115 Cellulitis of right lower limb: Secondary | ICD-10-CM | POA: Diagnosis not present

## 2023-09-21 DIAGNOSIS — Z96653 Presence of artificial knee joint, bilateral: Secondary | ICD-10-CM | POA: Diagnosis present

## 2023-09-21 DIAGNOSIS — Z7401 Bed confinement status: Secondary | ICD-10-CM | POA: Diagnosis not present

## 2023-09-21 DIAGNOSIS — G8929 Other chronic pain: Secondary | ICD-10-CM | POA: Diagnosis present

## 2023-09-21 DIAGNOSIS — B965 Pseudomonas (aeruginosa) (mallei) (pseudomallei) as the cause of diseases classified elsewhere: Secondary | ICD-10-CM | POA: Diagnosis not present

## 2023-09-21 DIAGNOSIS — I2510 Atherosclerotic heart disease of native coronary artery without angina pectoris: Secondary | ICD-10-CM | POA: Diagnosis present

## 2023-09-21 DIAGNOSIS — E876 Hypokalemia: Secondary | ICD-10-CM | POA: Diagnosis not present

## 2023-09-21 DIAGNOSIS — Z1152 Encounter for screening for COVID-19: Secondary | ICD-10-CM | POA: Diagnosis not present

## 2023-09-21 DIAGNOSIS — E119 Type 2 diabetes mellitus without complications: Secondary | ICD-10-CM | POA: Diagnosis not present

## 2023-09-21 DIAGNOSIS — E66811 Obesity, class 1: Secondary | ICD-10-CM | POA: Diagnosis present

## 2023-09-21 DIAGNOSIS — F419 Anxiety disorder, unspecified: Secondary | ICD-10-CM | POA: Diagnosis present

## 2023-09-21 DIAGNOSIS — M51379 Other intervertebral disc degeneration, lumbosacral region without mention of lumbar back pain or lower extremity pain: Secondary | ICD-10-CM | POA: Diagnosis not present

## 2023-09-21 DIAGNOSIS — I252 Old myocardial infarction: Secondary | ICD-10-CM | POA: Diagnosis not present

## 2023-09-21 DIAGNOSIS — Z7984 Long term (current) use of oral hypoglycemic drugs: Secondary | ICD-10-CM | POA: Diagnosis not present

## 2023-09-21 DIAGNOSIS — M48061 Spinal stenosis, lumbar region without neurogenic claudication: Secondary | ICD-10-CM | POA: Diagnosis not present

## 2023-09-21 DIAGNOSIS — L89313 Pressure ulcer of right buttock, stage 3: Secondary | ICD-10-CM | POA: Diagnosis not present

## 2023-09-21 DIAGNOSIS — N179 Acute kidney failure, unspecified: Principal | ICD-10-CM | POA: Diagnosis present

## 2023-09-21 DIAGNOSIS — I5022 Chronic systolic (congestive) heart failure: Secondary | ICD-10-CM | POA: Diagnosis present

## 2023-09-21 DIAGNOSIS — R0989 Other specified symptoms and signs involving the circulatory and respiratory systems: Secondary | ICD-10-CM | POA: Diagnosis not present

## 2023-09-21 DIAGNOSIS — H6121 Impacted cerumen, right ear: Secondary | ICD-10-CM | POA: Diagnosis present

## 2023-09-21 DIAGNOSIS — R531 Weakness: Secondary | ICD-10-CM | POA: Diagnosis not present

## 2023-09-21 DIAGNOSIS — I6782 Cerebral ischemia: Secondary | ICD-10-CM | POA: Diagnosis not present

## 2023-09-21 DIAGNOSIS — G4733 Obstructive sleep apnea (adult) (pediatric): Secondary | ICD-10-CM | POA: Diagnosis present

## 2023-09-21 DIAGNOSIS — R319 Hematuria, unspecified: Secondary | ICD-10-CM | POA: Diagnosis present

## 2023-09-21 DIAGNOSIS — B999 Unspecified infectious disease: Secondary | ICD-10-CM | POA: Diagnosis not present

## 2023-09-21 DIAGNOSIS — K219 Gastro-esophageal reflux disease without esophagitis: Secondary | ICD-10-CM | POA: Diagnosis present

## 2023-09-21 DIAGNOSIS — I509 Heart failure, unspecified: Secondary | ICD-10-CM | POA: Diagnosis not present

## 2023-09-21 DIAGNOSIS — Z7982 Long term (current) use of aspirin: Secondary | ICD-10-CM

## 2023-09-21 DIAGNOSIS — L8990 Pressure ulcer of unspecified site, unspecified stage: Secondary | ICD-10-CM | POA: Diagnosis present

## 2023-09-21 DIAGNOSIS — R52 Pain, unspecified: Secondary | ICD-10-CM | POA: Diagnosis not present

## 2023-09-21 DIAGNOSIS — I110 Hypertensive heart disease with heart failure: Secondary | ICD-10-CM | POA: Diagnosis not present

## 2023-09-21 DIAGNOSIS — N390 Urinary tract infection, site not specified: Secondary | ICD-10-CM | POA: Diagnosis not present

## 2023-09-21 DIAGNOSIS — E1143 Type 2 diabetes mellitus with diabetic autonomic (poly)neuropathy: Secondary | ICD-10-CM | POA: Diagnosis not present

## 2023-09-21 DIAGNOSIS — L89323 Pressure ulcer of left buttock, stage 3: Secondary | ICD-10-CM | POA: Diagnosis present

## 2023-09-21 DIAGNOSIS — E1100 Type 2 diabetes mellitus with hyperosmolarity without nonketotic hyperglycemic-hyperosmolar coma (NKHHC): Secondary | ICD-10-CM | POA: Diagnosis not present

## 2023-09-21 DIAGNOSIS — I10 Essential (primary) hypertension: Secondary | ICD-10-CM | POA: Diagnosis not present

## 2023-09-21 DIAGNOSIS — R059 Cough, unspecified: Secondary | ICD-10-CM | POA: Diagnosis not present

## 2023-09-21 DIAGNOSIS — R918 Other nonspecific abnormal finding of lung field: Secondary | ICD-10-CM | POA: Diagnosis not present

## 2023-09-21 DIAGNOSIS — M5126 Other intervertebral disc displacement, lumbar region: Secondary | ICD-10-CM | POA: Diagnosis not present

## 2023-09-21 DIAGNOSIS — R5381 Other malaise: Secondary | ICD-10-CM | POA: Diagnosis not present

## 2023-09-21 DIAGNOSIS — Z5982 Transportation insecurity: Secondary | ICD-10-CM

## 2023-09-21 DIAGNOSIS — Z8673 Personal history of transient ischemic attack (TIA), and cerebral infarction without residual deficits: Secondary | ICD-10-CM | POA: Diagnosis not present

## 2023-09-21 DIAGNOSIS — Z9841 Cataract extraction status, right eye: Secondary | ICD-10-CM

## 2023-09-21 DIAGNOSIS — G319 Degenerative disease of nervous system, unspecified: Secondary | ICD-10-CM | POA: Diagnosis not present

## 2023-09-21 DIAGNOSIS — Z961 Presence of intraocular lens: Secondary | ICD-10-CM | POA: Diagnosis present

## 2023-09-21 DIAGNOSIS — Z6831 Body mass index (BMI) 31.0-31.9, adult: Secondary | ICD-10-CM

## 2023-09-21 DIAGNOSIS — M7989 Other specified soft tissue disorders: Secondary | ICD-10-CM | POA: Diagnosis not present

## 2023-09-21 DIAGNOSIS — Z604 Social exclusion and rejection: Secondary | ICD-10-CM | POA: Diagnosis present

## 2023-09-21 DIAGNOSIS — Z8616 Personal history of COVID-19: Secondary | ICD-10-CM

## 2023-09-21 DIAGNOSIS — L03116 Cellulitis of left lower limb: Secondary | ICD-10-CM | POA: Diagnosis not present

## 2023-09-21 DIAGNOSIS — L0390 Cellulitis, unspecified: Principal | ICD-10-CM

## 2023-09-21 DIAGNOSIS — L89312 Pressure ulcer of right buttock, stage 2: Secondary | ICD-10-CM | POA: Diagnosis not present

## 2023-09-21 DIAGNOSIS — R32 Unspecified urinary incontinence: Secondary | ICD-10-CM

## 2023-09-21 DIAGNOSIS — M4316 Spondylolisthesis, lumbar region: Secondary | ICD-10-CM | POA: Diagnosis not present

## 2023-09-21 DIAGNOSIS — Z7189 Other specified counseling: Secondary | ICD-10-CM | POA: Diagnosis not present

## 2023-09-21 LAB — BASIC METABOLIC PANEL
Anion gap: 13 (ref 5–15)
BUN: 10 mg/dL (ref 8–23)
CO2: 25 mmol/L (ref 22–32)
Calcium: 8.5 mg/dL — ABNORMAL LOW (ref 8.9–10.3)
Chloride: 99 mmol/L (ref 98–111)
Creatinine, Ser: 0.63 mg/dL (ref 0.61–1.24)
GFR, Estimated: >60 mL/min (ref >60)
Glucose, Bld: 110 mg/dL — ABNORMAL HIGH (ref 70–99)
Potassium: 2.7 mmol/L — CL (ref 3.5–5.1)
Sodium: 137 mmol/L (ref 135–145)

## 2023-09-21 LAB — CBC WITH DIFFERENTIAL/PLATELET
Abs Immature Granulocytes: 0.07 10*3/uL (ref 0.00–0.07)
Basophils Absolute: 0.1 10*3/uL (ref 0.0–0.1)
Basophils Relative: 0 %
Eosinophils Absolute: 0.1 10*3/uL (ref 0.0–0.5)
Eosinophils Relative: 1 %
HCT: 40.7 % (ref 39.0–52.0)
Hemoglobin: 13.5 g/dL (ref 13.0–17.0)
Immature Granulocytes: 1 %
Lymphocytes Relative: 7 %
Lymphs Abs: 1 10*3/uL (ref 0.7–4.0)
MCH: 28.7 pg (ref 26.0–34.0)
MCHC: 33.2 g/dL (ref 30.0–36.0)
MCV: 86.6 fL (ref 80.0–100.0)
Monocytes Absolute: 0.9 10*3/uL (ref 0.1–1.0)
Monocytes Relative: 6 %
Neutro Abs: 12.2 10*3/uL — ABNORMAL HIGH (ref 1.7–7.7)
Neutrophils Relative %: 85 %
Platelets: 349 10*3/uL (ref 150–400)
RBC: 4.7 MIL/uL (ref 4.22–5.81)
RDW: 12.8 % (ref 11.5–15.5)
WBC: 14.3 10*3/uL — ABNORMAL HIGH (ref 4.0–10.5)
nRBC: 0 % (ref 0.0–0.2)

## 2023-09-21 LAB — RESP PANEL BY RT-PCR (RSV, FLU A&B, COVID)  RVPGX2
Influenza A by PCR: NEGATIVE
Influenza B by PCR: NEGATIVE
Resp Syncytial Virus by PCR: NEGATIVE
SARS Coronavirus 2 by RT PCR: NEGATIVE

## 2023-09-21 LAB — MAGNESIUM: Magnesium: 1.2 mg/dL — ABNORMAL LOW (ref 1.7–2.4)

## 2023-09-21 MED ORDER — HYDROMORPHONE HCL 1 MG/ML IJ SOLN
1.0000 mg | Freq: Once | INTRAMUSCULAR | Status: AC
  Administered 2023-09-22: 1 mg via INTRAVENOUS
  Filled 2023-09-21: qty 1

## 2023-09-21 MED ORDER — POTASSIUM CHLORIDE CRYS ER 20 MEQ PO TBCR
60.0000 meq | EXTENDED_RELEASE_TABLET | Freq: Once | ORAL | Status: AC
  Administered 2023-09-22: 60 meq via ORAL
  Filled 2023-09-21: qty 3

## 2023-09-21 MED ORDER — MAGNESIUM SULFATE 2 GM/50ML IV SOLN
2.0000 g | Freq: Once | INTRAVENOUS | Status: AC
  Administered 2023-09-22: 2 g via INTRAVENOUS
  Filled 2023-09-21: qty 50

## 2023-09-21 MED ORDER — SODIUM CHLORIDE 0.9 % IV SOLN
1.0000 g | Freq: Once | INTRAVENOUS | Status: AC
  Administered 2023-09-21: 1 g via INTRAVENOUS
  Filled 2023-09-21: qty 10

## 2023-09-21 MED ORDER — POTASSIUM CHLORIDE 10 MEQ/100ML IV SOLN
10.0000 meq | INTRAVENOUS | Status: AC
  Administered 2023-09-22: 10 meq via INTRAVENOUS
  Filled 2023-09-21: qty 100

## 2023-09-21 MED ORDER — HYDROMORPHONE HCL 1 MG/ML IJ SOLN
1.0000 mg | Freq: Once | INTRAMUSCULAR | Status: AC
  Administered 2023-09-21: 1 mg via INTRAVENOUS
  Filled 2023-09-21: qty 1

## 2023-09-21 NOTE — ED Triage Notes (Signed)
 Per ems pt was discharged on 1/31 and brought home via ems, per ems pt was found in hospital bed with EMS tarp under him, wife is unable to care for pt at home, pt has matted hair, strong ammonia smell and reportedly pressure sores that wife has not been able to care for since pt returned home.

## 2023-09-21 NOTE — ED Provider Notes (Signed)
 Devin Combs   CSN: [id number] Arrival date & time: 09/21/23  1748     History  Chief Complaint  Patient presents with   wound eval    Devin Combs is a 73 y.o. male presented to ED with progressively worsening weakness of the lower extremities, incontinence.  The patient's wife who is a former engineer, civil (consulting) and is primary caretaker reports she has been trying to take care of him for the past 2 years at home.  She says ever since he got COVID 3 years ago he had progressively worsening weakness in his lower extremities and now has been bedbound for about 2 years.  She herself deals with back issues and spine issues and now is having a hard time rolling the patient to clean him.  She is concerned because he feels that his weakness in his legs is gone even worse, we can barely twitch his toes, in the past 2 weeks he has also had incontinence bladder and bowel incontinence.  They deny any fevers, chills, cough, congestion, URI symptoms.  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  He has not been able to see any specialist as an outpatient, including neurology, the past 2 years.  Neurology evaluation 5 years ago in 2019 noting diabetic autonomic neuropathy and orthostatic hypotension, short-term memory issues and brain fog.  HPI     Home Medications Prior to Admission medications   Medication Sig Start Date End Date Taking? Authorizing Provider  acetaminophen  (TYLENOL ) 500 MG tablet Take 1,000 mg by mouth every 6 (six) hours as needed for mild pain.    [provider]  amphetamine-dextroamphetamine (ADDERALL XR) 30 MG 24 hr capsule Take 30 mg by mouth daily. 12/17/19   [provider]  Apoaequorin 10 MG CAPS Take 10 mg by mouth daily.     [provider]  aspirin  EC 81 MG EC tablet Take 1 tablet (81 mg total) by mouth  daily. 11/02/18   Henry Manuelita NOVAK, NP  atorvastatin  (LIPITOR ) 80 MG tablet TAKE 1 TABLET BY MOUTH EVERY DAY AT 6PM Patient taking differently: Take 80 mg by mouth daily. 08/04/19   Henry Manuelita NOVAK, NP  buPROPion  (WELLBUTRIN  XL) 300 MG 24 hr tablet Take 300 mg by mouth daily. 09/08/18   [provider]  carvedilol  (COREG ) 6.25 MG tablet TAKE 1 TABLET BY MOUTH 2 TIMES DAILY WITH A MEAL. 12/28/21   Wonda Sharper, MD  Cholecalciferol (QC VITAMIN D3) 50 MCG (2000 UT) TABS Take 2,000 Units by mouth daily.    [provider]  Cyanocobalamin  (VITAMIN B-12) 1000 MCG SUBL Place 1,000 mcg under the tongue daily.    [provider]  DULoxetine  (CYMBALTA ) 60 MG capsule Take 60 mg by mouth every evening.    [provider]  Epinastine HCl 0.05 % ophthalmic solution Place 1 drop into both eyes 2 (two) times daily as needed (allergies). 04/21/21   [provider]  fentaNYL  (DURAGESIC ) 75 MCG/HR Place 1 patch onto the skin every 3 (three) days. 01/26/20   [provider]  folic acid  (FOLVITE ) 1 MG tablet Take 1 mg by mouth daily. 08/10/18   [provider]  furosemide  (LASIX ) 20 MG tablet TAKE 1 TABLET BY MOUTH THREE TIMES A DAY AS NEEDED BETWEEN MEALS AND AT BEDTIME Patient taking differently: Take 20 mg by mouth daily as needed  for fluid or edema. 09/23/19   Bhagat, Aleene, PA  gabapentin  (NEURONTIN ) 600 MG tablet Take 600 mg by mouth daily as needed (pain). 04/28/21   [provider]  HYDROcodone -acetaminophen  (NORCO) 10-325 MG tablet Take 1 tablet by mouth 4 (four) times daily as needed for moderate pain.    [provider]  Hyoscyamine  Sulfate SL 0.125 MG SUBL Place 0.125 mg under the tongue 2 (two) times daily as needed (diarrhea).    [provider]  ipratropium (ATROVENT) 0.03 % nasal spray Place 2 sprays into both nostrils 3 (three) times daily as needed for rhinitis. 04/20/21   [provider]  losartan   (COZAAR ) 100 MG tablet Take 100 mg by mouth daily. 05/02/22   [provider]  metFORMIN  (GLUCOPHAGE ) 1000 MG tablet Take 1,000 mg by mouth 2 (two) times daily with a meal.    [provider]  nitroGLYCERIN  (NITROSTAT ) 0.4 MG SL tablet Place 1 tablet (0.4 mg total) under the tongue every 5 (five) minutes x 3 doses as needed for chest pain. 11/01/18   Henry Manuelita NOVAK, NP  ondansetron  (ZOFRAN ) 4 MG tablet Take 1 tablet (4 mg total) by mouth every 8 (eight) hours as needed for nausea or vomiting. 05/27/21   Burnetta Aures, MD  pioglitazone  (ACTOS ) 30 MG tablet Take 30 mg by mouth every morning. 11/28/19   [provider]  tamsulosin  (FLOMAX ) 0.4 MG CAPS capsule Take 1 capsule (0.4 mg total) by mouth daily after breakfast. Patient not taking: Reported on 12/24/2021 05/27/21   Burnetta Aures, MD  TRULICITY 1.5 MG/0.5ML SOPN Inject 0.5 mLs into the skin every Sunday. 09/19/18   [provider]      Allergies    Patient has no known allergies.    Review of Systems   Review of Systems  Physical Exam Updated Vital Signs BP (!) 156/59   Pulse 77   Temp 97.9 F (36.6 C) (Oral)   Resp 18   SpO2 98%  Physical Exam Constitutional:      General: He is not in acute distress. HENT:     Head: Normocephalic and atraumatic.  Eyes:     Conjunctiva/sclera: Conjunctivae normal.     Pupils: Pupils are equal, round, and reactive to light.  Cardiovascular:     Rate and Rhythm: Normal rate and regular rhythm.  Pulmonary:     Effort: Pulmonary effort is normal. No respiratory distress.  Abdominal:     General: There is no distension.     Tenderness: There is no abdominal tenderness.  Skin:    General: Skin is warm and dry.     Comments: There is a region of excoriation to the right posterior buttock, with some skin breakdown no surrounding erythema, no deep sacral decubitus ulceration noted  Neurological:     General: No focal deficit present.     Mental Status: He is  alert. Mental status is at baseline.     Comments: Chronic sensory deficits in the lower extremity, paresthesias diffusely, no anesthesia, no specific saddle anesthesia, gross sensation is intact Patient has 2 out of 5 strength in the bilateral ankles for dorsiflexion plantarflexion, 2 out of 5 strength with hip flexion bilaterally, normal upper body strength     ED Results / Procedures / Treatments   Labs (all labs ordered are listed, but only abnormal results are displayed) Labs Reviewed  BASIC METABOLIC PANEL - Abnormal; Notable for the following components:      Result Value   Potassium  2.7 (*)    Glucose, Bld 110 (*)    Calcium  8.5 (*)    All other components within normal limits  CBC WITH DIFFERENTIAL/PLATELET - Abnormal; Notable for the following components:   WBC 14.3 (*)    Neutro Abs 12.2 (*)    All other components within normal limits  MAGNESIUM  - Abnormal; Notable for the following components:   Magnesium  1.2 (*)    All other components within normal limits  RESP PANEL BY RT-PCR (RSV, FLU A&B, COVID)  [id number]  URINALYSIS, ROUTINE W REFLEX MICROSCOPIC    EKG None  Radiology No results found.  Procedures Procedures    Medications Ordered in ED Medications  cefTRIAXone  (ROCEPHIN ) 1 g in sodium chloride  0.9 % 100 mL IVPB (1 g Intravenous New Bag/Given 09/21/23 2129)  potassium chloride  10 mEq in 100 mL IVPB (has no administration in time range)  magnesium  sulfate IVPB 2 g 50 mL (has no administration in time range)  HYDROmorphone  (DILAUDID ) injection 1 mg (1 mg Intravenous Given 09/21/23 2034)    ED Course/ Medical Decision Making/ A&P Clinical Course as of 09/21/23 2155  Fri Sep 21, 2023  2055 No MRI available at [facility] due to staffing issues, pt will need transfer to [facility] for MRI.  Dr Bernarda Pereyra accepting EDP at [facility] ED.  MRI's ordered.  Will start antibiotic for likely cellulitis  [MT]    Clinical Course User Index [MT] Disaya Walt, Donnice PARAS, MD                                  Medical Decision Making Amount and/or Complexity of Data Reviewed Labs: ordered. Radiology: ordered.  Risk Prescription drug management.   This patient presents to the ED with concern for worsening weakness of lower extremity, incontinence, chronic back pain, excoriated buttock lesions. This involves an extensive number of treatment options, and is a complaint that carries with it a high risk of complications and morbidity.  The differential diagnosis includes worsening neuropathy versus lumbar pathology including infection or demyelinating disorder cauda equina versus CNS pathology versus other  Co-morbidities that complicate the patient evaluation: History of diabetes  Additional history obtained from his wife at bedside  External records from outside source obtained and reviewed including 2019 neurology evaluation.  MRI of the brain with and without contrast performed 2019 noting moderate perisylvian and mesial temporal atrophy but no acute findings.  I ordered and personally interpreted labs.  The pertinent results include: Hypokalemia potassium 2.7, hypomagnesemia.  No AKI.  White blood cell count 14.3.  UA pending  I ordered imaging studies including MRI brain, MRI lumbar spine, which were pending transfer to [facility]  The patient was maintained on a cardiac monitor.  I personally viewed and interpreted the cardiac monitored which showed an underlying rhythm of: Sinus rhythm  I ordered medication including Rocephin  for cellulitis, IV potassium magnesium  I have reviewed the patients home medicines and have made adjustments as needed  Dispostion:  Patient will be transferred by ambulance to [facility] ED for MRI imaging.  If there are no emergent neurological or neurosurgical conditions present, I anticipate the patient may require admission for electrolyte repletion, cellulitis, PT evaluation and admission, as he is requiring full time care,  bed-bound.         Final Clinical Impression(s) / ED Diagnoses Final diagnoses:  Cellulitis, unspecified cellulitis site  Urinary incontinence, unspecified type  Weakness of both legs  Rx / DC Orders ED Discharge Orders     None         Cottie Donnice PARAS, MD 09/21/23 2157

## 2023-09-21 NOTE — ED Provider Notes (Signed)
 Transferred from [facility] for MRI of brain and low back.  Has apparently had some worsening weakness of the LE and incontinence.  Low K+, getting repletion.  Also given abx for suspected cellulitis of LE.  Results for orders placed or performed during the hospital encounter of 09/21/23  Resp panel by RT-PCR (RSV, Flu A&B, Covid) Anterior Nasal Swab   Collection Time: 09/21/23  8:16 PM   Specimen: Anterior Nasal Swab  Result Value Ref Range   SARS Coronavirus 2 by RT PCR NEGATIVE NEGATIVE   Influenza A by PCR NEGATIVE NEGATIVE   Influenza B by PCR NEGATIVE NEGATIVE   Resp Syncytial Virus by PCR NEGATIVE NEGATIVE  Basic metabolic panel   Collection Time: 09/21/23  8:37 PM  Result Value Ref Range   Sodium 137 135 - 145 mmol/L   Potassium 2.7 (LL) 3.5 - 5.1 mmol/L   Chloride 99 98 - 111 mmol/L   CO2 25 22 - 32 mmol/L   Glucose, Bld 110 (H) 70 - 99 mg/dL   BUN 10 8 - 23 mg/dL   Creatinine, Ser 9.36 0.61 - 1.24 mg/dL   Calcium  8.5 (L) 8.9 - 10.3 mg/dL   GFR, Estimated >39 >39 mL/min   Anion gap 13 5 - 15  CBC with Differential   Collection Time: 09/21/23  8:37 PM  Result Value Ref Range   WBC 14.3 (H) 4.0 - 10.5 K/uL   RBC 4.70 4.22 - 5.81 MIL/uL   Hemoglobin 13.5 13.0 - 17.0 g/dL   HCT 59.2 60.9 - 47.9 %   MCV 86.6 80.0 - 100.0 fL   MCH 28.7 26.0 - 34.0 pg   MCHC 33.2 30.0 - 36.0 g/dL   RDW 87.1 88.4 - 84.4 %   Platelets 349 150 - 400 K/uL   nRBC 0.0 0.0 - 0.2 %   Neutrophils Relative % 85 %   Neutro Abs 12.2 (H) 1.7 - 7.7 K/uL   Lymphocytes Relative 7 %   Lymphs Abs 1.0 0.7 - 4.0 K/uL   Monocytes Relative 6 %   Monocytes Absolute 0.9 0.1 - 1.0 K/uL   Eosinophils Relative 1 %   Eosinophils Absolute 0.1 0.0 - 0.5 K/uL   Basophils Relative 0 %   Basophils Absolute 0.1 0.0 - 0.1 K/uL   Immature Granulocytes 1 %   Abs Immature Granulocytes 0.07 0.00 - 0.07 K/uL  Magnesium    Collection Time: 09/21/23  8:37 PM  Result Value Ref Range   Magnesium  1.2 (L) 1.7 - 2.4 mg/dL   MR BRAIN  W WO CONTRAST Result Date: 09/22/2023 CLINICAL DATA:  Initial evaluation for demyelinating disease. No other relevant history provided. EXAM: MRI HEAD WITHOUT AND WITH CONTRAST TECHNIQUE: Multiplanar, multiecho pulse sequences of the brain and surrounding structures were obtained without and with intravenous contrast. CONTRAST:  10mL GADAVIST  GADOBUTROL  1 MMOL/ML IV SOLN COMPARISON:  CT from 12/24/2021 and MRI from 09/19/2017. FINDINGS: Brain: Diffuse prominence of the CSF containing spaces compatible with generalized cerebral atrophy. Mild T2/FLAIR hyperintensity involving the periventricular white matter and pons, most characteristic of chronic microvascular ischemic disease, mild for age. Few small remote lacunar infarcts noted about the thalami. No other parenchymal changes to suggest demyelinating disease. No evidence for acute or subacute infarct. No areas of chronic cortical infarction. No acute or chronic intracranial blood products. No mass lesion, midline shift or mass effect. Diffuse ventricular prominence most likely related to global parenchymal volume loss. No hydrocephalus. No extra-axial fluid collection. Pituitary gland suprasellar region within normal  limits. No abnormal enhancement. Vascular: Major intracranial vascular flow voids are maintained. Skull and upper cervical spine: Craniocervical junction within normal limits. Bone marrow signal intensity normal. No acute scalp soft tissue abnormality. Subcentimeter nonenhancing nodular lesion noted at the left parietal scalp, likely benign and of doubtful significance. Sinuses/Orbits: Prior bilateral ocular lens replacement. Paranasal sinuses are largely clear. Moderate right mastoid effusion noted. Negative nasopharynx. Other: None. IMPRESSION: 1. No acute intracranial abnormality. 2. Moderately advanced cerebral atrophy with mild chronic microvascular ischemic disease. Few small remote lacunar infarcts about the thalami. 3. Moderate right mastoid  effusion. Electronically Signed   By: Morene Hoard M.D.   On: 09/22/2023 01:13   MR Lumbar Spine W Wo Contrast Result Date: 09/22/2023 CLINICAL DATA:  Initial evaluation for epidural abscess versus cauda equina. No other relevant history is provided. EXAM: MRI LUMBAR SPINE WITHOUT AND WITH CONTRAST TECHNIQUE: Multiplanar and multiecho pulse sequences of the lumbar spine were obtained without and with intravenous contrast. CONTRAST:  10mL GADAVIST  GADOBUTROL  1 MMOL/ML IV SOLN COMPARISON:  MRI from 07/20/2017. FINDINGS: Segmentation: Standard. Lowest well-formed disc space labeled the L5-S1 level. Alignment: Chronic 4 mm anterolisthesis of L4 on L5. Trace 2 mm retrolisthesis of L3 on L4. Vertebrae: Postoperative changes from prior PLIF at L4-5. Vertebral body height maintained without acute or chronic fracture. Bone marrow signal intensity within normal limits. No worrisome osseous lesions. No evidence for osteomyelitis discitis or septic arthritis. Conus medullaris and cauda equina: Conus extends to the L1 level. Conus and cauda equina appear normal. No abnormal enhancement or epidural collections. Paraspinal and other soft tissues: Paraspinous soft tissues within normal limits. Subcentimeter simple right renal cyst noted, of doubtful significance, no follow-up imaging recommended. Disc levels: L1-2: Disc desiccation with mild disc bulge. Superimposed right extraforaminal disc protrusion closely approximates the exiting right L1 nerve root. Mild facet spurring. No spinal stenosis. Foramina remain patent. L2-3: Disc desiccation with mild disc bulge. Mild reactive endplate spurring. No spinal stenosis. Mild to moderate bilateral L2 foraminal narrowing. L3-4: Disc desiccation with mild disc bulge. Reactive endplate spurring. Superimposed small right foraminal to extraforaminal disc protrusion closely approximates the exiting right L3 nerve root. Mild bilateral facet hypertrophy. No spinal stenosis. Mild to  moderate bilateral L3 foraminal stenosis. L4-5: Prior PLIF. Moderate right greater than left facet hypertrophy with residual mild narrowing of the lateral recesses bilaterally, slightly worse on the right. Central canal remains patent. Mild bilateral L4 foraminal stenosis. L5-S1: Disc desiccation with mild disc bulge. Mild reactive endplate spurring. No spinal stenosis. Foramina remain patent. IMPRESSION: 1. No acute abnormality within the lumbar spine. 2. Prior PLIF at L4-5. Residual mild bilateral lateral recess stenosis and mild bilateral L4 foraminal narrowing, largely due to facet disease. 3. Right foraminal to extraforaminal disc protrusions at L1-2 and L3-4, potentially affecting the exiting right L1 and L3 nerve roots respectively. 4. Mild to moderate bilateral L2 and L3 foraminal stenosis related to disc bulge and facet hypertrophy. Electronically Signed   By: Morene Hoard M.D.   On: 09/22/2023 00:52   MRI without signs of stroke or cauda equina.  No clear explanation for his worsening leg weakness.    Spoke with hospitalist, Dr. Laurence-- will see in consult, possibly observation admission.  Recommended getting TOC involved for possible placement.   Jarold Olam HERO, PA-C 09/22/23 9541    Tonia Chew, MD 09/23/23 [phone]

## 2023-09-22 ENCOUNTER — Encounter (HOSPITAL_COMMUNITY): Payer: Self-pay | Admitting: Internal Medicine

## 2023-09-22 DIAGNOSIS — L89312 Pressure ulcer of right buttock, stage 2: Secondary | ICD-10-CM

## 2023-09-22 DIAGNOSIS — E1100 Type 2 diabetes mellitus with hyperosmolarity without nonketotic hyperglycemic-hyperosmolar coma (NKHHC): Secondary | ICD-10-CM | POA: Diagnosis not present

## 2023-09-22 DIAGNOSIS — E785 Hyperlipidemia, unspecified: Secondary | ICD-10-CM | POA: Diagnosis not present

## 2023-09-22 DIAGNOSIS — L8990 Pressure ulcer of unspecified site, unspecified stage: Secondary | ICD-10-CM | POA: Diagnosis present

## 2023-09-22 DIAGNOSIS — I10 Essential (primary) hypertension: Secondary | ICD-10-CM

## 2023-09-22 DIAGNOSIS — Z7401 Bed confinement status: Secondary | ICD-10-CM

## 2023-09-22 DIAGNOSIS — E876 Hypokalemia: Secondary | ICD-10-CM | POA: Diagnosis present

## 2023-09-22 LAB — HEMOGLOBIN A1C
Hgb A1c MFr Bld: 5.4 % (ref 4.8–5.6)
Mean Plasma Glucose: 108.28 mg/dL

## 2023-09-22 LAB — GLUCOSE, CAPILLARY
Glucose-Capillary: 146 mg/dL — ABNORMAL HIGH (ref 70–99)
Glucose-Capillary: 132 mg/dL — ABNORMAL HIGH (ref 70–99)
Glucose-Capillary: 102 mg/dL — ABNORMAL HIGH (ref 70–99)
Glucose-Capillary: 150 mg/dL — ABNORMAL HIGH (ref 70–99)

## 2023-09-22 LAB — BASIC METABOLIC PANEL
Anion gap: 10 (ref 5–15)
BUN: 10 mg/dL (ref 8–23)
CO2: 25 mmol/L (ref 22–32)
Calcium: 8.4 mg/dL — ABNORMAL LOW (ref 8.9–10.3)
Chloride: 101 mmol/L (ref 98–111)
Creatinine, Ser: 0.75 mg/dL (ref 0.61–1.24)
GFR, Estimated: >60 mL/min (ref >60)
Glucose, Bld: 184 mg/dL — ABNORMAL HIGH (ref 70–99)
Potassium: 4.2 mmol/L (ref 3.5–5.1)
Sodium: 136 mmol/L (ref 135–145)

## 2023-09-22 LAB — MAGNESIUM: Magnesium: 1.8 mg/dL (ref 1.7–2.4)

## 2023-09-22 LAB — LIPID PANEL
Cholesterol: 115 mg/dL (ref 0–200)
HDL: 30 mg/dL — ABNORMAL LOW (ref >40)
LDL Cholesterol: 68 mg/dL (ref 0–99)
Total CHOL/HDL Ratio: 3.8 {ratio}
Triglycerides: 83 mg/dL (ref <150)
VLDL: 17 mg/dL (ref 0–40)

## 2023-09-22 LAB — TSH: TSH: 2.227 u[IU]/mL (ref 0.350–4.500)

## 2023-09-22 LAB — CBG MONITORING, ED: Glucose-Capillary: 160 mg/dL — ABNORMAL HIGH (ref 70–99)

## 2023-09-22 MED ORDER — CARVEDILOL 6.25 MG PO TABS
6.2500 mg | ORAL_TABLET | Freq: Two times a day (BID) | ORAL | Status: DC
  Administered 2023-09-22 – 2023-10-05 (×27): 6.25 mg via ORAL
  Filled 2023-09-22 (×28): qty 1

## 2023-09-22 MED ORDER — BUPROPION HCL ER (XL) 150 MG PO TB24
300.0000 mg | ORAL_TABLET | Freq: Every day | ORAL | Status: DC
  Administered 2023-09-22 – 2023-10-05 (×14): 300 mg via ORAL
  Filled 2023-09-22 (×14): qty 2

## 2023-09-22 MED ORDER — ACETAMINOPHEN 500 MG PO TABS
1000.0000 mg | ORAL_TABLET | Freq: Four times a day (QID) | ORAL | Status: DC | PRN
  Administered 2023-09-22 – 2023-10-05 (×13): 1000 mg via ORAL
  Filled 2023-09-22 (×13): qty 2

## 2023-09-22 MED ORDER — INSULIN ASPART 100 UNIT/ML IJ SOLN
0.0000 [IU] | Freq: Three times a day (TID) | INTRAMUSCULAR | Status: DC
  Administered 2023-09-22 – 2023-09-23 (×3): 2 [IU] via SUBCUTANEOUS
  Administered 2023-09-23 – 2023-09-25 (×4): 3 [IU] via SUBCUTANEOUS
  Administered 2023-09-25: 2 [IU] via SUBCUTANEOUS
  Administered 2023-09-26: 3 [IU] via SUBCUTANEOUS
  Administered 2023-09-27 (×2): 2 [IU] via SUBCUTANEOUS
  Administered 2023-09-28: 3 [IU] via SUBCUTANEOUS
  Administered 2023-09-28 – 2023-09-29 (×3): 2 [IU] via SUBCUTANEOUS
  Administered 2023-09-29 – 2023-10-01 (×5): 3 [IU] via SUBCUTANEOUS
  Administered 2023-10-01 – 2023-10-02 (×4): 2 [IU] via SUBCUTANEOUS
  Administered 2023-10-03: 3 [IU] via SUBCUTANEOUS
  Administered 2023-10-03: 2 [IU] via SUBCUTANEOUS
  Administered 2023-10-03 – 2023-10-05 (×4): 3 [IU] via SUBCUTANEOUS
  Administered 2023-10-05: 2 [IU] via SUBCUTANEOUS

## 2023-09-22 MED ORDER — ACETAMINOPHEN 650 MG RE SUPP: 650.0000 mg | Freq: Four times a day (QID) | RECTAL | Status: DC | PRN

## 2023-09-22 MED ORDER — DULOXETINE HCL 60 MG PO CPEP
60.0000 mg | ORAL_CAPSULE | Freq: Every evening | ORAL | Status: DC
Start: 2023-09-22 — End: 2023-10-06
  Administered 2023-09-22 – 2023-10-05 (×14): 60 mg via ORAL
  Filled 2023-09-22 (×14): qty 1

## 2023-09-22 MED ORDER — INSULIN ASPART 100 UNIT/ML IJ SOLN
0.0000 [IU] | Freq: Every day | INTRAMUSCULAR | Status: DC
  Administered 2023-10-02: 2 [IU] via SUBCUTANEOUS

## 2023-09-22 MED ORDER — MAGNESIUM SULFATE 2 GM/50ML IV SOLN
2.0000 g | Freq: Once | INTRAVENOUS | Status: AC
  Administered 2023-09-22: 2 g via INTRAVENOUS
  Filled 2023-09-22: qty 50

## 2023-09-22 MED ORDER — GADOBUTROL 1 MMOL/ML IV SOLN
10.0000 mL | Freq: Once | INTRAVENOUS | Status: AC | PRN
  Administered 2023-09-22: 10 mL via INTRAVENOUS

## 2023-09-22 MED ORDER — ONDANSETRON HCL 4 MG PO TABS: 4.0000 mg | ORAL_TABLET | Freq: Four times a day (QID) | ORAL | Status: DC | PRN

## 2023-09-22 MED ORDER — LIDOCAINE 5 % EX PTCH
1.0000 | MEDICATED_PATCH | CUTANEOUS | Status: DC
  Administered 2023-09-22 – 2023-10-05 (×14): 1 via TRANSDERMAL
  Filled 2023-09-22 (×15): qty 1

## 2023-09-22 MED ORDER — HEPARIN SODIUM (PORCINE) 5000 UNIT/ML IJ SOLN
5000.0000 [IU] | Freq: Three times a day (TID) | INTRAMUSCULAR | Status: DC
  Administered 2023-09-22 – 2023-10-05 (×41): 5000 [IU] via SUBCUTANEOUS
  Filled 2023-09-22 (×42): qty 1

## 2023-09-22 MED ORDER — VITAMIN B-12 1000 MCG PO TABS
1000.0000 ug | ORAL_TABLET | Freq: Every day | ORAL | Status: DC
  Administered 2023-09-22 – 2023-10-05 (×14): 1000 ug via ORAL
  Filled 2023-09-22 (×14): qty 1

## 2023-09-22 MED ORDER — POTASSIUM CHLORIDE 10 MEQ/100ML IV SOLN
INTRAVENOUS | Status: AC
  Administered 2023-09-22: 10 meq via INTRAVENOUS
  Filled 2023-09-22: qty 100

## 2023-09-22 MED ORDER — ASPIRIN 81 MG PO TBEC
81.0000 mg | DELAYED_RELEASE_TABLET | Freq: Every day | ORAL | Status: DC
  Administered 2023-09-22 – 2023-10-05 (×14): 81 mg via ORAL
  Filled 2023-09-22 (×14): qty 1

## 2023-09-22 MED ORDER — LOSARTAN POTASSIUM 50 MG PO TABS
100.0000 mg | ORAL_TABLET | Freq: Every day | ORAL | Status: DC
  Administered 2023-09-22 – 2023-09-23 (×2): 100 mg via ORAL
  Filled 2023-09-22 (×2): qty 2

## 2023-09-22 MED ORDER — ONDANSETRON HCL 4 MG/2ML IJ SOLN: 4.0000 mg | Freq: Four times a day (QID) | INTRAMUSCULAR | Status: DC | PRN

## 2023-09-22 MED ORDER — POTASSIUM CHLORIDE CRYS ER 20 MEQ PO TBCR
40.0000 meq | EXTENDED_RELEASE_TABLET | Freq: Once | ORAL | Status: AC
  Administered 2023-09-22: 40 meq via ORAL
  Filled 2023-09-22: qty 2

## 2023-09-22 MED ORDER — ATORVASTATIN CALCIUM 80 MG PO TABS
80.0000 mg | ORAL_TABLET | Freq: Every day | ORAL | Status: DC
  Administered 2023-09-22 – 2023-10-05 (×14): 80 mg via ORAL
  Filled 2023-09-22 (×14): qty 1

## 2023-09-22 NOTE — Assessment & Plan Note (Signed)
 Pt ordered 2 grams IV mag. Will give another 2 grams and repeat BMP and Mg.

## 2023-09-22 NOTE — Assessment & Plan Note (Signed)
 Continue with cozaar , coreg 

## 2023-09-22 NOTE — Assessment & Plan Note (Signed)
 Pt has been bedbound for 2 weeks. MRI brain and lumbar spine are negative for acute causes of his bedbound status. Doubt he would qualify for SNF bed. Will have TOC look per wife request.

## 2023-09-22 NOTE — Progress Notes (Signed)
 Patient presented earlier this morning for hypokalemia and decubitus ulcers and wife requesting SNF placement.  Patient seen and examined at bedside and plan of care discussed with him and wife at bedside.  I have reviewed patient's medical records including this morning's H&P, current vitals, labs, medications myself.  Labs pending this morning.  PT/OT eval.  TOC consult.

## 2023-09-22 NOTE — Assessment & Plan Note (Signed)
 Add SSI. Check A1c. Hold metformin  for now.

## 2023-09-22 NOTE — Assessment & Plan Note (Addendum)
 Present on admission. Right hip/buttocks, right buttocks, left posterior thighs. All superficial. None are infected. Pt does not have cellulitis. Wound care consult.  Right buttock/hip ulcer Left posterior thigh Right buttock ulcer

## 2023-09-22 NOTE — NC FL2 (Signed)
 [place]  MEDICAID FL2 LEVEL OF CARE FORM     IDENTIFICATION  Patient Name: Devin Combs Birthdate: 12/28/1950 Sex: male Admission Date (Current Location): 09/21/2023  [facility] and [place] Number:  Producer, Television/film/video and Address:  [facility] [place], [address] [place], [place] [address]      Provider Number: [id number]  Attending Physician Name and Address:  Cheryle Page, MD  Relative Name and Phone Number:  Peri,Catherine Spouse   [phone]    Current Level of Care: Hospital Recommended Level of Care: Skilled Nursing Facility Prior Approval Number:    Date Approved/Denied:   PASRR Number: [id number]  Discharge Plan: SNF    Current Diagnoses: Patient Active Problem List   Diagnosis Date Noted   Hypokalemia 09/22/2023   Hypomagnesemia 09/22/2023   Decubitus ulcers 09/22/2023   Bedbound 09/22/2023   S/P lumbar fusion 05/26/2021   Chronic back pain 05/24/2021   History of squamous cell carcinoma 05/17/2021   Melanocytic nevi of trunk 05/17/2021   Other melanin hyperpigmentation 05/17/2021   Other seborrheic keratosis 05/17/2021   Lumbar spondylosis 03/01/2021   Orthostatic hypotension 01/30/2020   Essential hypertension 11/01/2018   Hyperlipidemia 11/01/2018   Chronic pain 04/26/2018   DM (diabetes mellitus), type 2 (HCC) 04/26/2018   Degeneration of lumbar intervertebral disc 01/24/2018   Low back pain 01/11/2018   Complex sleep apnea syndrome 07/10/2017   Treatment-emergent central sleep apnea 07/10/2017   Excessive daytime sleepiness 05/31/2017   Snoring 05/31/2017   Coronary artery disease involving native coronary artery of native heart without angina pectoris 05/31/2017   Intermittent palpitations 05/31/2017   Perspiration-excessive 05/31/2017   Disorder of autonomic nervous system 05/31/2017   OA (osteoarthritis) of knee 07/19/2016    Orientation RESPIRATION BLADDER Height & Weight     Self, Time, Situation,  Place  Normal Incontinent Weight: 236 lb 12.4 oz (107.4 kg) Height:  6' 1 (185.4 cm)  BEHAVIORAL SYMPTOMS/MOOD NEUROLOGICAL BOWEL NUTRITION STATUS      Continent Diet (See dc summary)  AMBULATORY STATUS COMMUNICATION OF NEEDS Skin   Extensive Assist Verbally PU Stage and Appropriate Care (Wound / Incision  09/22/23 Buttocks,Wound / Incision 09/22/23 Thigh Anterior;Proximal;Right R hip;Wound / Incision  09/22/23 Thigh Distal;Left;Posterior;Wound / Incision 09/22/23 Vertebral column Right;)                       Personal Care Assistance Level of Assistance  Bathing, Feeding, Dressing, Total care Bathing Assistance: Maximum assistance Feeding assistance: Limited assistance Dressing Assistance: Maximum assistance Total Care Assistance: Maximum assistance   Functional Limitations Info  Sight, Hearing, Speech Sight Info: Impaired Hearing Info: Impaired Speech Info: Adequate    SPECIAL CARE FACTORS FREQUENCY  PT (By licensed PT), OT (By licensed OT)     PT Frequency: 5x weekly OT Frequency: 5x weekly            Contractures      Additional Factors Info  Code Status, Allergies Code Status Info: Full Allergies Info: No known allergies           Current Medications (09/22/2023):  This is the current hospital active medication list Current Facility-Administered Medications  Medication Dose Route Frequency Provider Last Rate Last Admin   acetaminophen  (TYLENOL ) tablet 1,000 mg  1,000 mg Oral Q6H PRN Laurence Locus, DO   1,000 mg at 09/22/23 0700   Or   acetaminophen  (TYLENOL ) suppository 650 mg  650 mg Rectal Q6H PRN Laurence Locus, DO  aspirin  EC tablet 81 mg  81 mg Oral Daily Laurence Locus, DO   81 mg at 09/22/23 0827   atorvastatin  (LIPITOR ) tablet 80 mg  80 mg Oral Daily Laurence Locus, DO   80 mg at 09/22/23 0827   buPROPion  (WELLBUTRIN  XL) 24 hr tablet 300 mg  300 mg Oral Daily Laurence Locus, DO   300 mg at 09/22/23 9171   carvedilol  (COREG ) tablet 6.25 mg  6.25 mg Oral BID WC  Laurence Locus, DO   6.25 mg at 09/22/23 0827   cyanocobalamin  (VITAMIN B12) tablet 1,000 mcg  1,000 mcg Oral Daily Laurence Locus, DO   1,000 mcg at 09/22/23 9171   DULoxetine  (CYMBALTA ) DR capsule 60 mg  60 mg Oral QPM Laurence Locus, DO       heparin  injection 5,000 Units  5,000 Units Subcutaneous Q8H Laurence Locus, DO   5,000 Units at 09/22/23 0700   insulin  aspart (novoLOG ) injection 0-15 Units  0-15 Units Subcutaneous TID WC Laurence Locus, DO   2 Units at 09/22/23 9165   insulin  aspart (novoLOG ) injection 0-5 Units  0-5 Units Subcutaneous QHS Laurence Locus, DO       lidocaine  (LIDODERM ) 5 % 1 patch  1 patch Transdermal Q24H Cheryle Page, MD   1 patch at 09/22/23 1015   losartan  (COZAAR ) tablet 100 mg  100 mg Oral Daily Laurence Locus, DO   100 mg at 09/22/23 9171   ondansetron  (ZOFRAN ) tablet 4 mg  4 mg Oral Q6H PRN Laurence Locus, DO       Or   ondansetron  (ZOFRAN ) injection 4 mg  4 mg Intravenous Q6H PRN Laurence Locus, DO         Discharge Medications: Please see discharge summary for a list of discharge medications.  Relevant Imaging Results:  Relevant Lab Results:   Additional Information SSN: [id number]  Arlana JINNY Moats, LCSWA

## 2023-09-22 NOTE — Evaluation (Signed)
 Occupational Therapy Evaluation Patient Details Name: Devin Combs MRN: [id number] DOB: 30-Mar-1951 Today's Date: 09/22/2023   History of Present Illness 73 year old Caucasian male presents 2/7 with complaints of recent fecal and urinary incontinence. Labs showed Hypokalemia, and Hypomagnesemia. He has also developed decubitus ulcers on the right hip, left buttock and left posterior thigh last week. Prior history of autonomic dysfunction, prior history of COVID with long COVID symptoms, history of hypertension, coronary artery disease, chronic back pain with a history of lumbar fusion, bedridden for the last 2 years,   Clinical Impression   Pt dependent at baseline for most ADLs and mobility, lives with spouse who has been assisting him as pt has been bed bound for ~2 years now. Pt currently max +2 for bed mobility and able to sit EOB x5 min with min-mod posterior assist. Pt able to self feed at bed level with min A, max A for all other ADLs at bed level. Pt presenting with impairments listed below, will follow acutely. Patient will benefit from continued inpatient follow up therapy, <3 hours/day to maximize safety/ind with ADL/functional mobility.        If plan is discharge home, recommend the following: Two people to help with walking and/or transfers;A lot of help with bathing/dressing/bathroom;Assistance with cooking/housework;Assistance with feeding;Direct supervision/assist for medications management;Direct supervision/assist for financial management;Assist for transportation;Help with stairs or ramp for entrance    Functional Status Assessment  Patient has had a recent decline in their functional status and demonstrates the ability to make significant improvements in function in a reasonable and predictable amount of time.  Equipment Recommendations  Other (comment) (defer)    Recommendations for Other Services PT consult     Precautions / Restrictions Precautions Precautions:  Fall Restrictions Weight Bearing Restrictions Per Provider Order: No      Mobility Bed Mobility Overal bed mobility: Needs Assistance Bed Mobility: Rolling, Sidelying to Sit, Sit to Supine Rolling: Max assist, +2 for physical assistance, Used rails Sidelying to sit: Max assist, +2 for physical assistance, HOB elevated, Used rails   Sit to supine: Max assist, +2 for physical assistance   General bed mobility comments: Max +2 for rolling to Rt side, able to reach and pull through bed rail. Some assist to bring LEs off bed. Max +2 to rise for trunk support. Max +2 to return to supine with helicopter technique to reduce friction on buttocks. Cues throughout for technique.    Transfers                   General transfer comment: Unable to perform safely at this time due to impaired strength/balance      Balance Overall balance assessment: Needs assistance Sitting-balance support: Bilateral upper extremity supported Sitting balance-Leahy Scale: Poor Sitting balance - Comments: Progressed from Mod A to brief periods of Min A seated EOB Postural control: Posterior lean                                 ADL either performed or assessed with clinical judgement   ADL Overall ADL's : Needs assistance/impaired Eating/Feeding: Minimal assistance   Grooming: Minimal assistance   Upper Body Bathing: Maximal assistance   Lower Body Bathing: Maximal assistance   Upper Body Dressing : Maximal assistance   Lower Body Dressing: Maximal assistance   Toilet Transfer: Maximal assistance   Toileting- Clothing Manipulation and Hygiene: Maximal assistance       Functional mobility during  ADLs: Maximal assistance       Vision   Vision Assessment?: No apparent visual deficits     Perception Perception: Not tested       Praxis Praxis: Not tested       Pertinent Vitals/Pain Pain Assessment Pain Assessment: Faces Pain Score: 6  Faces Pain Scale: Hurts even  more Pain Location: pressure wounds, feet, back and Rt shoulder Pain Descriptors / Indicators: Aching Pain Intervention(s): Limited activity within patient's tolerance, Monitored during session, Repositioned     Extremity/Trunk Assessment Upper Extremity Assessment Upper Extremity Assessment: Generalized weakness (hx of L shoulder impairment per pt)   Lower Extremity Assessment Lower Extremity Assessment: Defer to PT evaluation   Cervical / Trunk Assessment Cervical / Trunk Assessment: Normal   Communication Communication Communication: No apparent difficulties   Cognition Arousal: Alert Behavior During Therapy: WFL for tasks assessed/performed Overall Cognitive Status: Within Functional Limits for tasks assessed                                       General Comments  VSS, spouse present    Exercises     Shoulder Instructions      Home Living Family/patient expects to be discharged to:: Private residence Living Arrangements: Spouse/significant other Available Help at Discharge: Family;Available 24 hours/day Type of Home: House Home Access: Stairs to enter [employer] of Steps: 1 Entrance Stairs-Rails: None Home Layout: One level     Bathroom Shower/Tub: Tub/shower unit         Home Equipment: Hospital bed;Other (comment);Wheelchair - Forensic Psychologist (2 wheels);BSC/3in1;Shower seat (hoyer lift)          Prior Functioning/Environment Prior Level of Function : Needs assist             Mobility Comments: Bedbound for 2 years, has not even sat EOB. Wife performs all care. was able to roll previously but has gotten significantly weaker ADLs Comments: ADLs at bed level, pt able to feed self        OT Problem List: Decreased strength;Decreased range of motion;Decreased activity tolerance;Impaired balance (sitting and/or standing);Pain      OT Treatment/Interventions: Self-care/ADL training;Therapeutic exercise;Energy  conservation;DME and/or AE instruction;Therapeutic activities;Patient/family education;Balance training    OT Goals(Current goals can be found in the care plan section) Acute Rehab OT Goals Patient Stated Goal: none stated OT Goal Formulation: With patient Time For Goal Achievement: 10/06/23 Potential to Achieve Goals: Good ADL Goals Pt Will Perform Grooming: with supervision;sitting Pt Will Perform Upper Body Dressing: with contact guard assist;sitting Pt Will Perform Lower Body Dressing: with adaptive equipment;with min assist;bed level;sitting/lateral leans Additional ADL Goal #1: pt will perform bed mobility min A in prep for ADLs  OT Frequency: Min 1X/week    Co-evaluation PT/OT/SLP Co-Evaluation/Treatment: Yes Reason for Co-Treatment: Complexity of the patient's impairments (multi-system involvement);For patient/therapist safety;To address functional/ADL transfers PT goals addressed during session: Mobility/safety with mobility;Balance;Strengthening/ROM OT goals addressed during session: ADL's and self-care;Strengthening/ROM      AM-PAC OT 6 Clicks Daily Activity     Outcome Measure Help from another person eating meals?: A Little Help from another person taking care of personal grooming?: A Little Help from another person toileting, which includes using toliet, bedpan, or urinal?: A Lot Help from another person bathing (including washing, rinsing, drying)?: A Lot Help from another person to put on and taking off regular upper body clothing?: A Lot Help from another  person to put on and taking off regular lower body clothing?: A Lot 6 Click Score: 14   End of Session Nurse Communication: Mobility status  Activity Tolerance: Patient tolerated treatment well Patient left: in bed;with call bell/phone within reach;with bed alarm set;with family/visitor present  OT Visit Diagnosis: Unsteadiness on feet (R26.81);Other abnormalities of gait and mobility (R26.89);Muscle weakness  (generalized) (M62.81)                Time: 9153-9086 OT Time Calculation (min): 27 min Charges:  OT General Charges $OT Visit: 1 Visit OT Evaluation $OT Eval Moderate Complexity: 1 Mod  Hayward Rylander K, OTD, OTR/L [facility]erred Acute Rehab [phone]   Laneta POUR Koonce 09/22/2023, 10:39 AM

## 2023-09-22 NOTE — Assessment & Plan Note (Signed)
Continue with lipitor.

## 2023-09-22 NOTE — Evaluation (Signed)
 Physical Therapy Evaluation Patient Details Name: Devin Combs MRN: [id number] DOB: Jun 07, 1951 Today's Date: 09/22/2023  History of Present Illness  73 year old Caucasian male presents 2/7 with complaints of recent fecal and urinary incontinence. Labs showed Hypokalemia, and Hypomagnesemia. He has also developed decubitus ulcers on the right hip, left buttock and left posterior thigh last week. Prior history of autonomic dysfunction, prior history of COVID with long COVID symptoms, history of hypertension, coronary artery disease, chronic back pain with a history of lumbar fusion, bedridden for the last 2 years,   Clinical Impression  Pt admitted with above complications. Bed bound for nearly 2 years; wife performs all care. Pt was able to roll over and assist with bed mobility until about 2 months ago when he became too weak to assist with rolling in bed. Wife now having difficulty adequately caring for pt and has developed decubitus ulcers. Max assist +2 for bed mobility. Patient was able to sit EOB with mod-min assist and bil UE support (has not sat up on EOB in nearly 2 years.)  Pt currently with functional limitations due to the deficits listed below (see PT Problem List). Pt will benefit from acute skilled PT to increase their independence and safety with mobility to allow discharge.      Wife feels that pt became depressed after back surgery and eventually became bed ridden. Based on my assessment and given medical hx, he has potential to greatly enhance his functional independence if he obtains an adequate level of rehab at [facility]. They have a hoyer lift, w/c, and hospital bed at home. The hoyer lift is new and has not been used yet.   If pt not a candidate for SNF, would benefit from maximum HHPT services and in home aides to assist with care and  progress pt and family to safely transfer with hoyer lift to w/c.  I suspect given his debility, without adequate care at home and lack of daily PT  to improve his functional mobility, he has little chance to improve his level of independence and wounds are likely to worsen.   I believe pt deserves a chance to regain function with the appropriate level of rehab and nursing care provided by SNF. Family reports this was denied previously despite being recommended by HHPTs and PCP. Now he is admitted with worsening complications.  Will require PTAR transfer if returns home from hospital.    If plan is discharge home, recommend the following: A lot of help with walking and/or transfers;A lot of help with bathing/dressing/bathroom;Assistance with cooking/housework;Direct supervision/assist for medications management;Direct supervision/assist for financial management;Assist for transportation;Help with stairs or ramp for entrance   Can travel by private vehicle        Equipment Recommendations None recommended by PT  Recommendations for Other Services       Functional Status Assessment Patient has had a recent decline in their functional status and demonstrates the ability to make significant improvements in function in a reasonable and predictable amount of time.     Precautions / Restrictions Precautions Precautions: Fall Restrictions Weight Bearing Restrictions Per Provider Order: No      Mobility  Bed Mobility Overal bed mobility: Needs Assistance Bed Mobility: Rolling, Sidelying to Sit, Sit to Supine Rolling: Max assist, +2 for physical assistance, Used rails Sidelying to sit: Max assist, +2 for physical assistance, HOB elevated, Used rails   Sit to supine: Max assist, +2 for physical assistance   General bed mobility comments: Max +2 for rolling to Rt  side, able to reach and pull through bed rail. Some assist to bring LEs off bed. Max +2 to rise for trunk support. Max +2 to return to supine with helicopter technique to reduce friction on buttocks. Cues throughout for technique.    Transfers                    General transfer comment: Unable to perform safely at this time due to impaired strength/balance    Ambulation/Gait                  Stairs            Wheelchair Mobility     Tilt Bed    Modified Rankin (Stroke Patients Only)       Balance Overall balance assessment: Needs assistance Sitting-balance support: Bilateral upper extremity supported Sitting balance-Leahy Scale: Poor Sitting balance - Comments: Progressed from Mod A to brief periods of Min A seated EOB Postural control: Posterior lean                                   Pertinent Vitals/Pain Pain Assessment Pain Assessment: Faces Faces Pain Scale: Hurts even more Pain Location: pressure wounds, feet, back and Rt shoulder Pain Descriptors / Indicators: Aching Pain Intervention(s): Monitored during session, Repositioned    Home Living Family/patient expects to be discharged to:: Private residence Living Arrangements: Spouse/significant other Available Help at Discharge: Family;Available 24 hours/day Type of Home: House Home Access: Stairs to enter Entrance Stairs-Rails: None Entrance Stairs-Number of Steps: 1   Home Layout: One level Home Equipment: Hospital bed;Other (comment);Wheelchair - Forensic Psychologist (2 wheels);BSC/3in1;Shower seat Secondary [employer] Teacher)      Prior Function Prior Level of Function : Needs assist             Mobility Comments: Bedbound for 2 years, has not even sat EOB. Wife performs all care. ADLs Comments: Bedbound for 2 years, has not even sat EOB. Wife performs all care.     Extremity/Trunk Assessment   Upper Extremity Assessment Upper Extremity Assessment: Defer to OT evaluation    Lower Extremity Assessment Lower Extremity Assessment: Generalized weakness (Ankle DF >3/5, knee extension 2+/5)       Communication   Communication Communication: No apparent difficulties  Cognition Arousal: Alert Behavior During Therapy: WFL for tasks  assessed/performed                                            General Comments General comments (skin integrity, edema, etc.): Seated balance EOB working on weight shifting, Requies UE support to hold bed. Able to pull himself forward briefly.    Exercises General Exercises - Lower Extremity Ankle Circles/Pumps: AROM, Both, 15 reps, Supine Quad Sets: Strengthening, Both, 10 reps, Supine Long Arc Quad: Strengthening, Both, 5 reps, Seated (limited strength against gravity) Hip ABduction/ADduction: Strengthening, Both, 5 reps, Seated   Assessment/Plan    PT Assessment Patient needs continued PT services  PT Problem List Decreased strength;Decreased range of motion;Decreased activity tolerance;Decreased balance;Decreased mobility;Decreased knowledge of use of DME;Impaired sensation;Obesity;Decreased skin integrity;Pain       PT Treatment Interventions DME instruction;Functional mobility training;Therapeutic activities;Therapeutic exercise;Balance training;Neuromuscular re-education;Patient/family education;Wheelchair mobility training    PT Goals (Current goals can be found in the Care Plan section)  Acute Rehab PT Goals Patient  Stated Goal: Be able to get into wheelchair, eventually walk, or golf. PT Goal Formulation: With patient/family Time For Goal Achievement: 10/06/23 Potential to Achieve Goals: Fair    Frequency Min 1X/week     Co-evaluation PT/OT/SLP Co-Evaluation/Treatment: Yes Reason for Co-Treatment: Complexity of the patient's impairments (multi-system involvement);For patient/therapist safety;To address functional/ADL transfers PT goals addressed during session: Mobility/safety with mobility;Balance;Strengthening/ROM         AM-PAC PT 6 Clicks Mobility  Outcome Measure Help needed turning from your back to your side while in a flat bed without using bedrails?: Total Help needed moving from lying on your back to sitting on the side of a flat  bed without using bedrails?: Total Help needed moving to and from a bed to a chair (including a wheelchair)?: Total Help needed standing up from a chair using your arms (e.g., wheelchair or bedside chair)?: Total Help needed to walk in hospital room?: Total Help needed climbing 3-5 steps with a railing? : Total 6 Click Score: 6    End of Session   Activity Tolerance: Patient tolerated treatment well (Weakness) Patient left: in bed;with call bell/phone within reach;with bed alarm set;with family/visitor present Nurse Communication: Mobility status;Need for lift equipment PT Visit Diagnosis: Muscle weakness (generalized) (M62.81);Difficulty in walking, not elsewhere classified (R26.2);Other symptoms and signs involving the nervous system (R29.898);Pain Pain - part of body:  (back, Rt shoulder, wounds, feet)    Time: 9154-9084 PT Time Calculation (min) (ACUTE ONLY): 30 min   Charges:   PT Evaluation $PT Eval Low Complexity: 1 Low   PT General Charges $$ ACUTE PT VISIT: 1 Visit         Leontine Roads, PT, DPT [facility] Services Physical Therapist Office: [phone] Website: [email or web address]   Leontine GORMAN Roads 09/22/2023, 9:59 AM

## 2023-09-22 NOTE — Assessment & Plan Note (Signed)
 Observation med/surg bed. Pt already given 60 meq po, IV kcl 10 meq x 2. Repeat BMP and give additional kcl if needed

## 2023-09-22 NOTE — TOC Progression Note (Signed)
 Transition of Care [facility]) - Progression Note    Patient Details  Name: Devin Combs MRN: [id number] Date of Birth: Dec 16, 1950  Transition of Care [facility] CM/SW Contact  Arlana JINNY Nicholaus [place] Phone Number: [phone] 09/22/2023, 11:13 AM  Clinical Narrative:   [facility] CSW met with pt and wife at bedside. CSW discussed SNF consult with pt and wife and explained the process. Pt and wife stated that they understand and would like to move forward. CSW completed pts fl2 and faxed out to SNFs.   TOC will continue following.          Expected Discharge Plan and Services                                               Social Determinants of Health (SDOH) Interventions SDOH Screenings   Food Insecurity: No Food Insecurity (09/22/2023)  Housing: Low Risk  (09/22/2023)  Transportation Needs: Unmet Transportation Needs (09/22/2023)  Utilities: Not At Risk (09/22/2023)  Social Connections: Socially Isolated (09/22/2023)  Tobacco Use: Low Risk  (12/24/2021)    Readmission Risk Interventions     No data to display

## 2023-09-22 NOTE — Hospital Course (Signed)
 HPI: 73 year old Caucasian male prior history of autonomic dysfunction, prior history of COVID with long COVID symptoms, history of hypertension, coronary artery disease, chronic back pain with a history of lumbar fusion, bedridden for the last 2 years, presents to the ER with his wife.  She states that he has had recent fecal and urinary incontinence.  He has also developed decubitus ulcers on the right hip, left buttock and left posterior thigh last week.  She has been caring for him at home.  She does not have any home health.  He has been getting all of his care exclusively via virtual health care visits.  He has not seen a doctor in well over 2 years.  He was sent over from [facility] to the [facility] ER due to the MRI scanner not being operational at [facility].  He had a brain MRI that was negative for acute intracranial abnormality.  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   lumbar MRI shows no cute abnormality lumbar spine.  Had a posterior fusion of the L4-5 level.    EDP thought he saw a cellulitis of the right decubitus buttock ulcer but I disagree. he Does not have cellulitis.  He does have a staged II pressure ulcer    Wife states that she wants him to be placed into a nursing home and then ultimately   Transferred back to her care  at home.   she understands the patient does not have a inpatient admissible diagnosis.  He can only be observed in the hospital for 48 hours while care arrangements can be made.  He may not qualify for nursing home.  She understands this.  Significant Events: Admitted 09/21/2023 for hypokalemia, hypomagnesemia.   Significant Labs: White count 14.3, hemoglobin 13.5, platelets of 349 magnesium  1.2 sodium 137, potassium 2.7, chloride of 99, bicarb of 25, BUN of 10, creatinine 0.63, COVID-negative, influenza negative, RSV negative.  Significant Imaging Studies: brain MRI that was negative for acute  intracranial abnormality.  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  lumbar MRI shows no cute abnormality lumbar spine.  Had a posterior fusion of the L4-5 level.  Antibiotic Therapy: Anti-infectives (From admission, onward)    Start     Dose/Rate Route Frequency Ordered Stop   09/21/23 2100  cefTRIAXone  (ROCEPHIN ) 1 g in sodium chloride  0.9 % 100 mL IVPB        1 g 200 mL/hr over 30 Minutes Intravenous  Once 09/21/23 2056 09/21/23 2159       Procedures:   Consultants:

## 2023-09-22 NOTE — H&P (Signed)
 History and Physical    Devin Combs FMW:[id number] DOB: 1951/05/10 DOA: 09/21/2023  DOS: the patient was seen and examined on 09/21/2023  PCP: Gib Charleston, MD   Patient coming from: Home  I have personally briefly reviewed patient's old medical records in [facility]  HPI: 73 year old Caucasian male prior history of autonomic dysfunction, prior history of COVID with long COVID symptoms, history of hypertension, coronary artery disease, chronic back pain with a history of lumbar fusion, bedridden for the last 2 years, presents to the ER with his wife.  She states that he has had recent fecal and urinary incontinence.  He has also developed decubitus ulcers on the right hip, left buttock and left posterior thigh last week.  She has been caring for him at home.  She does not have any home health.  He has been getting all of his care exclusively via virtual health care visits.  He has not seen a doctor in well over 2 years.  He was sent over from [facility] to the [facility], ER due to the MRI scanner not being operational at [facility].  He had a brain MRI that was negative for acute intracranial abnormality.  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   lumbar MRI shows no cute abnormality lumbar spine.  Had a posterior fusion of the L4-5 level.    EDP thought he saw a cellulitis of the right decubitus buttock ulcer but I disagree. he Does not have cellulitis.  He does have a staged II pressure ulcer    Wife states that she wants him to be placed into a nursing home and then ultimately   Transferred back to her care  at home.   she understands the patient does not have a inpatient admissible diagnosis.  He can only be observed in the hospital for 48 hours while care arrangements can be made.  He may not qualify for nursing home.  She understands this.  Significant Events: Admitted 09/21/2023 for hypokalemia, hypomagnesemia.   Significant  Labs: White count 14.3, hemoglobin 13.5, platelets of 349 magnesium  1.2 sodium 137, potassium 2.7, chloride of 99, bicarb of 25, BUN of 10, creatinine 0.63, COVID-negative, influenza negative, RSV negative.  Significant Imaging Studies: brain MRI that was negative for acute intracranial abnormality.  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  lumbar MRI shows no cute abnormality lumbar spine.  Had a posterior fusion of the L4-5 level.  Antibiotic Therapy: Anti-infectives (From admission, onward)    Start     Dose/Rate Route Frequency Ordered Stop   09/21/23 2100  cefTRIAXone  (ROCEPHIN ) 1 g in sodium chloride  0.9 % 100 mL IVPB        1 g 200 mL/hr over 30 Minutes Intravenous  Once 09/21/23 2056 09/21/23 2159       Procedures:   Consultants:     ED Course: MRI brain negative for acute CVA, MRI lumbar spine negative for acute pathology.  Review of Systems:  Review of Systems  Constitutional: Negative.   HENT: Negative.    Eyes: Negative.   Respiratory: Negative.    Cardiovascular: Negative.   Gastrointestinal:        Fecal incontinence  Genitourinary:        Urinary incontinence  Musculoskeletal:  Positive for back pain.  Skin: Negative.        Has developed decubitus ulcers over the last 1-2 weeks  Neurological:        Has been  bedbound for 2 years  All other systems reviewed and are negative.   Past Medical History:  Diagnosis Date   Acute systolic heart failure (HCC)    Anemia    Anxiety    Arthritis    Cancer (HCC)    basal and squamous cell carcinoma   CHF (congestive heart failure) (HCC)    Chronic back pain    COVID-19    DDD (degenerative disc disease), thoracolumbar    Degenerative cervical spinal stenosis    Depression    Diabetic autonomic neuropathy (HCC)    Dyspnea    with exertion    GERD (gastroesophageal reflux disease)    Headache    History of bronchitis    History of kidney stones    Hyperlipidemia     Hypertension    Hypogonadism in male    Myocardial infarction [facility])    Neuropathy    NSTEMI (non-ST elevated myocardial infarction) (HCC)    10/29/2018- PCI/DESx1 to pLAD, EF 35%   Orthostatic hypotension    OSA (obstructive sleep apnea)    per pt study done 2006 (approx)  used cpap few yrs then stopped using, stated didn't feel he needed it anymore   Psoriasis    Retinal tear    Right knee meniscal tear    Type 2 diabetes mellitus (HCC)    Urinary hesitancy    Vitamin D deficiency     Past Surgical History:  Procedure Laterality Date   ABDOMINAL EXPOSURE Left 05/26/2021   Procedure: ABDOMINAL EXPOSURE;  Surgeon: Gretta Lonni PARAS, MD;  Location: [facility] OR;  Service: Vascular;  Laterality: Left;   BACK SURGERY     CATARACT EXTRACTION W/ INTRAOCULAR LENS  IMPLANT, BILATERAL  right 2015//  left 2012   CORONARY/GRAFT ACUTE MI REVASCULARIZATION N/A 10/30/2018   Procedure: Coronary/Graft Acute MI Revascularization;  Surgeon: Burnard Debby LABOR, MD;  Location: [facility] INVASIVE CV LAB;  Service: Cardiovascular;  Laterality: N/A;   CORRECTION REPAIR MULTIPLE  TOES , LEFT FOOT  YRS AGO   EXTRACORPOREAL SHOCK WAVE LITHOTRIPSY  2000 approx   KNEE ARTHROSCOPY Left 2007   KNEE ARTHROSCOPY Right 02/10/2015   Procedure: ARTHROSCOPY RIGHT KNEE WITH DEBRIDEMENT AND CHRONDROPLASTY;  Surgeon: Dempsey Moan, MD;  Location: [facility];  Service: Orthopedics;  Laterality: Right;   LEFT HEART CATH AND CORONARY ANGIOGRAPHY N/A 10/30/2018   Procedure: LEFT HEART CATH AND CORONARY ANGIOGRAPHY;  Surgeon: Burnard Debby LABOR, MD;  Location: [facility] INVASIVE CV LAB;  Service: Cardiovascular;  Laterality: N/A;   OBLIQUE LUMBAR INTERBODY FUSION 1 LEVEL WITH PERCUTANEOUS SCREWS Left 05/26/2021   Procedure: OBLIQUE LUMBAR INTERBODY FUSION 1 LEVEL WITH PERCUTANEOUS SCREWS (OLIF L4-5 WITH POSTERIOR SPINAL FUSION INTERBODY);  Surgeon: Burnetta Aures, MD;  Location: [facility] OR;  Service: Orthopedics;  Laterality: Left;  LEFT  TAP BLOCK WITH EXPAREL    PARTIAL KNEE ARTHROPLASTY Right 07/19/2016   Procedure: RIGHT KNEE MEDICAL UNICOMPARTMENTAL ARTHROPLASTY;  Surgeon: Dempsey Moan, MD;  Location: [facility] ORS;  Service: Orthopedics;  Laterality: Right;   RADICAL ORCHIECTOMY Left 1996   benign   REMOVAL SPURS RIGHT GREAT TOE  YRS AGO   TOTAL KNEE ARTHROPLASTY Left 09/16/2007     reports that he has never smoked. He has never used smokeless tobacco. He reports current alcohol use. He reports that he does not use drugs.  No Known Allergies  Family History  Problem Relation Age of Onset   Neuropathy Neg Hx     Prior to Admission medications   Medication Sig  Start Date End Date Taking? Authorizing Provider  acetaminophen  (TYLENOL ) 500 MG tablet Take 1,000 mg by mouth every 6 (six) hours as needed for mild pain.    [provider]  Apoaequorin 10 MG CAPS Take 10 mg by mouth daily.     [provider]  aspirin  EC 81 MG EC tablet Take 1 tablet (81 mg total) by mouth daily. 11/02/18   Henry Manuelita NOVAK, NP  atorvastatin  (LIPITOR ) 80 MG tablet TAKE 1 TABLET BY MOUTH EVERY DAY AT 6PM Patient taking differently: Take 80 mg by mouth daily. 08/04/19   Henry Manuelita NOVAK, NP  buPROPion  (WELLBUTRIN  XL) 300 MG 24 hr tablet Take 300 mg by mouth daily. 09/08/18   [provider]  carvedilol  (COREG ) 6.25 MG tablet TAKE 1 TABLET BY MOUTH 2 TIMES DAILY WITH A MEAL. 12/28/21   Wonda Sharper, MD  Cholecalciferol (QC VITAMIN D3) 50 MCG (2000 UT) TABS Take 2,000 Units by mouth daily.    [provider]  Cyanocobalamin  (VITAMIN B-12) 1000 MCG SUBL Place 1,000 mcg under the tongue daily.    [provider]  DULoxetine  (CYMBALTA ) 60 MG capsule Take 60 mg by mouth every evening.    [provider]  Epinastine HCl 0.05 % ophthalmic solution Place 1 drop into both eyes 2 (two) times daily as needed (allergies). 04/21/21   [provider]  gabapentin  (NEURONTIN ) 600 MG tablet Take 600 mg  by mouth daily as needed (pain). 04/28/21   [provider]  losartan  (COZAAR ) 100 MG tablet Take 100 mg by mouth daily. 05/02/22   [provider]  metFORMIN  (GLUCOPHAGE ) 1000 MG tablet Take 1,000 mg by mouth 2 (two) times daily with a meal.    [provider]  nitroGLYCERIN  (NITROSTAT ) 0.4 MG SL tablet Place 1 tablet (0.4 mg total) under the tongue every 5 (five) minutes x 3 doses as needed for chest pain. 11/01/18   Henry Manuelita NOVAK, NP    Physical Exam: Vitals:   09/21/23 1815 09/21/23 2015 09/21/23 2157 09/22/23 0238  BP: (!) 150/87 (!) 156/59  (!) 142/86  Pulse: 73 77  95  Resp: 20 18  16   Temp: 97.9 F (36.6 C)  98.5 F (36.9 C)   TempSrc: Oral  Oral   SpO2: 97% 98%  95%    Physical Exam Vitals reviewed.  Constitutional:      Appearance: He is obese. He is not toxic-appearing or diaphoretic.     Comments: Chronically ill appearing  HENT:     Nose: Nose normal.  Eyes:     General: No scleral icterus. Cardiovascular:     Rate and Rhythm: Normal rate and regular rhythm.  Pulmonary:     Effort: Pulmonary effort is normal.     Breath sounds: Normal breath sounds.  Abdominal:     General: There is no distension.     Tenderness: There is no abdominal tenderness.  Skin:    General: Skin is warm and dry.     Capillary Refill: Capillary refill takes less than 2 seconds.     Comments: See pictures of decubitus ulcers     Right buttock/hip ulcer Left posterior thigh Right buttock ulcer           Labs on Admission: I have personally reviewed following labs and imaging studies  CBC: Recent Labs  Lab 09/21/23 2037  WBC 14.3*  NEUTROABS 12.2*  HGB 13.5  HCT 40.7  MCV 86.6  PLT 349   Basic Metabolic  Panel: Recent Labs  Lab 09/21/23 2037  NA 137  K 2.7*  CL 99  CO2 25  GLUCOSE 110*  BUN 10  CREATININE 0.63  CALCIUM  8.5*  MG 1.2*   GFR: CrCl cannot be calculated (Unknown ideal weight.). CBG: Recent Labs  Lab 09/22/23 0247   GLUCAP 160*   Urine analysis:    Component Value Date/Time   COLORURINE YELLOW 12/24/2021 0340   APPEARANCEUR CLEAR 12/24/2021 0340   LABSPEC 1.026 12/24/2021 0340   PHURINE 5.0 12/24/2021 0340   GLUCOSEU NEGATIVE 12/24/2021 0340   HGBUR NEGATIVE 12/24/2021 0340   BILIRUBINUR NEGATIVE 12/24/2021 0340   KETONESUR NEGATIVE 12/24/2021 0340   PROTEINUR NEGATIVE 12/24/2021 0340   UROBILINOGEN 0.2 09/11/2007 1400   NITRITE NEGATIVE 12/24/2021 0340   LEUKOCYTESUR NEGATIVE 12/24/2021 0340    Radiological Exams on Admission: I have personally reviewed images MR BRAIN W WO CONTRAST Result Date: 09/22/2023 CLINICAL DATA:  Initial evaluation for demyelinating disease. No other relevant history provided. EXAM: MRI HEAD WITHOUT AND WITH CONTRAST TECHNIQUE: Multiplanar, multiecho pulse sequences of the brain and surrounding structures were obtained without and with intravenous contrast. CONTRAST:  10mL GADAVIST  GADOBUTROL  1 MMOL/ML IV SOLN COMPARISON:  CT from 12/24/2021 and MRI from 09/19/2017. FINDINGS: Brain: Diffuse prominence of the CSF containing spaces compatible with generalized cerebral atrophy. Mild T2/FLAIR hyperintensity involving the periventricular white matter and pons, most characteristic of chronic microvascular ischemic disease, mild for age. Few small remote lacunar infarcts noted about the thalami. No other parenchymal changes to suggest demyelinating disease. No evidence for acute or subacute infarct. No areas of chronic cortical infarction. No acute or chronic intracranial blood products. No mass lesion, midline shift or mass effect. Diffuse ventricular prominence most likely related to global parenchymal volume loss. No hydrocephalus. No extra-axial fluid collection. Pituitary gland suprasellar region within normal limits. No abnormal enhancement. Vascular: Major intracranial vascular flow voids are maintained. Skull and upper cervical spine: Craniocervical junction within normal  limits. Bone marrow signal intensity normal. No acute scalp soft tissue abnormality. Subcentimeter nonenhancing nodular lesion noted at the left parietal scalp, likely benign and of doubtful significance. Sinuses/Orbits: Prior bilateral ocular lens replacement. Paranasal sinuses are largely clear. Moderate right mastoid effusion noted. Negative nasopharynx. Other: None. IMPRESSION: 1. No acute intracranial abnormality. 2. Moderately advanced cerebral atrophy with mild chronic microvascular ischemic disease. Few small remote lacunar infarcts about the thalami. 3. Moderate right mastoid effusion. Electronically Signed   By: Morene Hoard M.D.   On: 09/22/2023 01:13   MR Lumbar Spine W Wo Contrast Result Date: 09/22/2023 CLINICAL DATA:  Initial evaluation for epidural abscess versus cauda equina. No other relevant history is provided. EXAM: MRI LUMBAR SPINE WITHOUT AND WITH CONTRAST TECHNIQUE: Multiplanar and multiecho pulse sequences of the lumbar spine were obtained without and with intravenous contrast. CONTRAST:  10mL GADAVIST  GADOBUTROL  1 MMOL/ML IV SOLN COMPARISON:  MRI from 07/20/2017. FINDINGS: Segmentation: Standard. Lowest well-formed disc space labeled the L5-S1 level. Alignment: Chronic 4 mm anterolisthesis of L4 on L5. Trace 2 mm retrolisthesis of L3 on L4. Vertebrae: Postoperative changes from prior PLIF at L4-5. Vertebral body height maintained without acute or chronic fracture. Bone marrow signal intensity within normal limits. No worrisome osseous lesions. No evidence for osteomyelitis discitis or septic arthritis. Conus medullaris and cauda equina: Conus extends to the L1 level. Conus and cauda equina appear normal. No abnormal enhancement or epidural collections. Paraspinal and other soft tissues: Paraspinous soft tissues within normal limits. Subcentimeter simple right renal cyst noted,  of doubtful significance, no follow-up imaging recommended. Disc levels: L1-2: Disc desiccation with mild  disc bulge. Superimposed right extraforaminal disc protrusion closely approximates the exiting right L1 nerve root. Mild facet spurring. No spinal stenosis. Foramina remain patent. L2-3: Disc desiccation with mild disc bulge. Mild reactive endplate spurring. No spinal stenosis. Mild to moderate bilateral L2 foraminal narrowing. L3-4: Disc desiccation with mild disc bulge. Reactive endplate spurring. Superimposed small right foraminal to extraforaminal disc protrusion closely approximates the exiting right L3 nerve root. Mild bilateral facet hypertrophy. No spinal stenosis. Mild to moderate bilateral L3 foraminal stenosis. L4-5: Prior PLIF. Moderate right greater than left facet hypertrophy with residual mild narrowing of the lateral recesses bilaterally, slightly worse on the right. Central canal remains patent. Mild bilateral L4 foraminal stenosis. L5-S1: Disc desiccation with mild disc bulge. Mild reactive endplate spurring. No spinal stenosis. Foramina remain patent. IMPRESSION: 1. No acute abnormality within the lumbar spine. 2. Prior PLIF at L4-5. Residual mild bilateral lateral recess stenosis and mild bilateral L4 foraminal narrowing, largely due to facet disease. 3. Right foraminal to extraforaminal disc protrusions at L1-2 and L3-4, potentially affecting the exiting right L1 and L3 nerve roots respectively. 4. Mild to moderate bilateral L2 and L3 foraminal stenosis related to disc bulge and facet hypertrophy. Electronically Signed   By: Morene Hoard M.D.   On: 09/22/2023 00:52   EKG: My personal interpretation of EKG shows: no EKG to review  Assessment/Plan Principal Problem:   Hypokalemia Active Problems:   Hypomagnesemia   Decubitus ulcers   Essential hypertension   Hyperlipidemia   DM (diabetes mellitus), type 2 (HCC)   Bedbound  Assessment and Plan: * Hypokalemia Observation med/surg bed. Pt already given 60 meq po, IV kcl 10 meq x 2. Repeat BMP and give additional kcl if  needed  Decubitus ulcers Present on admission. Right buttocks, left buttocks, left posterior thighs. All superficial. None are infected. Pt does not have cellulitis. Wound care consult.            Hypomagnesemia Pt ordered 2 grams IV mag. Will give another 2 grams and repeat BMP and Mg.  Bedbound Pt has been bedbound for 2 weeks. MRI brain and lumbar spine are negative for acute causes of his bedbound status. Doubt he would qualify for SNF bed. Will have TOC look per wife request.  DM (diabetes mellitus), type 2 (HCC) Add SSI. Check A1c. Hold metformin  for now.  Hyperlipidemia Continue with lipitor .  Essential hypertension Continue with cozaar , coreg    DVT prophylaxis: SQ Heparin  Code Status: Full Code Family Communication: discussed with wife kathy at bedside  Disposition Plan: SNF vs home  Consults called: none  Admission status: Observation, Med-Surg   Camellia Door, DO [facility] 09/22/2023, 4:25 AM

## 2023-09-23 DIAGNOSIS — E876 Hypokalemia: Secondary | ICD-10-CM | POA: Diagnosis not present

## 2023-09-23 LAB — CBC WITH DIFFERENTIAL/PLATELET
Abs Immature Granulocytes: 0.04 10*3/uL (ref 0.00–0.07)
Basophils Absolute: 0 10*3/uL (ref 0.0–0.1)
Basophils Relative: 0 %
Eosinophils Absolute: 0.2 10*3/uL (ref 0.0–0.5)
Eosinophils Relative: 2 %
HCT: 37.3 % — ABNORMAL LOW (ref 39.0–52.0)
Hemoglobin: 12.5 g/dL — ABNORMAL LOW (ref 13.0–17.0)
Immature Granulocytes: 0 %
Lymphocytes Relative: 9 %
Lymphs Abs: 0.9 10*3/uL (ref 0.7–4.0)
MCH: 29.1 pg (ref 26.0–34.0)
MCHC: 33.5 g/dL (ref 30.0–36.0)
MCV: 86.7 fL (ref 80.0–100.0)
Monocytes Absolute: 0.7 10*3/uL (ref 0.1–1.0)
Monocytes Relative: 7 %
Neutro Abs: 8.2 10*3/uL — ABNORMAL HIGH (ref 1.7–7.7)
Neutrophils Relative %: 82 %
Platelets: 274 10*3/uL (ref 150–400)
RBC: 4.3 MIL/uL (ref 4.22–5.81)
RDW: 13.2 % (ref 11.5–15.5)
WBC: 10.1 10*3/uL (ref 4.0–10.5)
nRBC: 0 % (ref 0.0–0.2)

## 2023-09-23 LAB — COMPREHENSIVE METABOLIC PANEL WITH GFR
ALT: 15 U/L (ref 0–44)
AST: 17 U/L (ref 15–41)
Albumin: 2.2 g/dL — ABNORMAL LOW (ref 3.5–5.0)
Alkaline Phosphatase: 107 U/L (ref 38–126)
Anion gap: 9 (ref 5–15)
BUN: 15 mg/dL (ref 8–23)
CO2: 25 mmol/L (ref 22–32)
Calcium: 8.4 mg/dL — ABNORMAL LOW (ref 8.9–10.3)
Chloride: 102 mmol/L (ref 98–111)
Creatinine, Ser: 1.43 mg/dL — ABNORMAL HIGH (ref 0.61–1.24)
GFR, Estimated: 52 mL/min — ABNORMAL LOW
Glucose, Bld: 142 mg/dL — ABNORMAL HIGH (ref 70–99)
Potassium: 3.6 mmol/L (ref 3.5–5.1)
Sodium: 136 mmol/L (ref 135–145)
Total Bilirubin: 0.6 mg/dL (ref 0.0–1.2)
Total Protein: 5.2 g/dL — ABNORMAL LOW (ref 6.5–8.1)

## 2023-09-23 LAB — GLUCOSE, CAPILLARY
Glucose-Capillary: 129 mg/dL — ABNORMAL HIGH (ref 70–99)
Glucose-Capillary: 157 mg/dL — ABNORMAL HIGH (ref 70–99)
Glucose-Capillary: 156 mg/dL — ABNORMAL HIGH (ref 70–99)
Glucose-Capillary: 113 mg/dL — ABNORMAL HIGH (ref 70–99)

## 2023-09-23 LAB — MAGNESIUM: Magnesium: 1.7 mg/dL (ref 1.7–2.4)

## 2023-09-23 MED ORDER — OXYCODONE HCL 5 MG PO TABS
5.0000 mg | ORAL_TABLET | Freq: Four times a day (QID) | ORAL | Status: DC | PRN
  Administered 2023-09-23 – 2023-10-05 (×23): 5 mg via ORAL
  Filled 2023-09-23 (×24): qty 1

## 2023-09-23 NOTE — Plan of Care (Signed)
  Problem: Education: Goal: Ability to describe self-care measures that may prevent or decrease complications (Diabetes Survival Skills Education) will improve Outcome: Progressing   Problem: Education: Goal: Individualized Educational Video(s) Outcome: Progressing   Problem: Coping: Goal: Ability to adjust to condition or change in health will improve Outcome: Progressing

## 2023-09-23 NOTE — Care Management Obs Status (Signed)
 MEDICARE OBSERVATION STATUS NOTIFICATION   Patient Details  Name: Khi Mcmillen MRN: [id number] Date of Birth: June 28, 1951   Medicare Observation Status Notification Given:  Yes    Omie Bickers, RN 09/23/2023, 8:48 AM

## 2023-09-23 NOTE — Progress Notes (Signed)
 PROGRESS NOTE    Devin Combs  FMW:[id number] DOB: 01/25/1951 DOA: 09/21/2023 PCP: Gib Charleston, MD   Brief Narrative:  73 year old Caucasian male prior history of autonomic dysfunction, prior history of COVID with long COVID symptoms, history of hypertension, coronary artery disease, chronic back pain with a history of lumbar fusion, bedridden for the last 2 years presented with worsening decubitus ulcers and wife requesting that patient be placed into rehab facility.  On presentation, MRI of brain did not show any acute intracranial abnormity.  Lumbar spine did not show acute abnormality in the lumbar spine.  Assessment & Plan:   Decubitus ulcers: Present on admission on right and left buttocks, left posterior thigh -Continue local care.  Wound care consult.  No signs of infection.  Hypokalemia -Resolved  Hypomagnesemia -Resolved  Acute kidney injury -Creatinine has bumped up to 1.43 this morning.  Encourage oral intake.  Repeat a.m. labs.  Hold losartan .  Hypertension Hyperlipidemia -Continue Coreg .  Hold losartan .  Continue statin  Diabetes mellitus type 2  -Continue CBGs with SSI.  Hold metformin .  Bedbound History of autonomic dysfunction -PT recommending SNF placement.  TOC consulted  Depression -Continue bupropion  and duloxetine .  Outpatient follow-up with PCP  Goals of care -Overall prognosis is guarded to poor.  Palliative care consultation pending.  Obesity class I -Outpatient follow-up  Leukocytosis -Resolved   DVT prophylaxis: Subcutaneous heparin  Code Status: Full Family Communication: Wife at bedside Disposition Plan: Status is: Observation The patient will require care spanning > 2 midnights and should be moved to inpatient because: Of severity of illness   Consultants: Palliative care  Procedures: None  Antimicrobials: None   Subjective: Patient seen and examined at bedside.  No fever, vomiting, agitation or seizures  reported.  Objective: Vitals:   09/22/23 1708 09/22/23 1952 09/23/23 0533 09/23/23 0753  BP: (!) 142/75 (!) 142/73 136/77 (!) 132/47  Pulse: 80 77 81 83  Resp: 18 18 16 18   Temp: 98.4 F (36.9 C) 98.5 F (36.9 C) 97.6 F (36.4 C) 97.7 F (36.5 C)  TempSrc: Oral Oral Oral Oral  SpO2: 99% 97% 97% 96%  Weight:      Height:       No intake or output data in the 24 hours ending 09/23/23 1016 Filed Weights   09/22/23 0642  Weight: 107.4 kg    Examination:  General exam: Appears calm and comfortable.  Looks chronically ill and deconditioned.  On room air. Respiratory system: Bilateral decreased breath sounds at bases with scattered crackles Cardiovascular system: S1 & S2 heard, Rate controlled Gastrointestinal system: Abdomen is obese, nondistended, soft and nontender. Normal bowel sounds heard. Extremities: No cyanosis, clubbing, edema  Central nervous system: Alert and oriented.  Slow to respond.  Poor historian.   Skin: No petechiae/rashes Psychiatry: Flat affect.  Not agitated.    Data Reviewed: I have personally reviewed following labs and imaging studies  CBC: Recent Labs  Lab 09/21/23 2037 09/23/23 0708  WBC 14.3* 10.1  NEUTROABS 12.2* 8.2*  HGB 13.5 12.5*  HCT 40.7 37.3*  MCV 86.6 86.7  PLT 349 274   Basic Metabolic Panel: Recent Labs  Lab 09/21/23 2037 09/22/23 1000 09/23/23 0708  NA 137 136 136  K 2.7* 4.2 3.6  CL 99 101 102  CO2 25 25 25   GLUCOSE 110* 184* 142*  BUN 10 10 15   CREATININE 0.63 0.75 1.43*  CALCIUM  8.5* 8.4* 8.4*  MG 1.2* 1.8 1.7   GFR: Estimated Creatinine Clearance: 60 mL/min (A) (by  C-G formula based on SCr of 1.43 mg/dL (H)). Liver Function Tests: Recent Labs  Lab 09/23/23 0708  AST 17  ALT 15  ALKPHOS 107  BILITOT 0.6  PROT 5.2*  ALBUMIN 2.2*   No results for input(s): LIPASE, AMYLASE in the last 168 hours. No results for input(s): AMMONIA in the last 168 hours. Coagulation Profile: No results for input(s):  INR, PROTIME in the last 168 hours. Cardiac Enzymes: No results for input(s): CKTOTAL, CKMB, CKMBINDEX, TROPONINI in the last 168 hours. BNP (last 3 results) No results for input(s): PROBNP in the last 8760 hours. HbA1C: Recent Labs    09/22/23 1000  HGBA1C 5.4   CBG: Recent Labs  Lab 09/22/23 0822 09/22/23 1206 09/22/23 1726 09/22/23 1950 09/23/23 0806  GLUCAP 146* 132* 102* 150* 129*   Lipid Profile: Recent Labs    09/22/23 1000  CHOL 115  HDL 30*  LDLCALC 68  TRIG 83  CHOLHDL 3.8   Thyroid  Function Tests: Recent Labs    09/22/23 1000  TSH 2.227   Anemia Panel: No results for input(s): VITAMINB12, FOLATE, FERRITIN, TIBC, IRON, RETICCTPCT in the last 72 hours. Sepsis Labs: No results for input(s): PROCALCITON, LATICACIDVEN in the last 168 hours.  Recent Results (from the past 240 hours)  Resp panel by RT-PCR (RSV, Flu A&B, Covid) Anterior Nasal Swab     Status: None   Collection Time: 09/21/23  8:16 PM   Specimen: Anterior Nasal Swab  Result Value Ref Range Status   SARS Coronavirus 2 by RT PCR NEGATIVE NEGATIVE Final    Comment: (NOTE) SARS-CoV-2 target nucleic acids are NOT DETECTED.  The SARS-CoV-2 RNA is generally detectable in upper respiratory specimens during the acute phase of infection. The lowest concentration of SARS-CoV-2 viral copies this assay can detect is 138 copies/mL. A negative result does not preclude SARS-Cov-2 infection and should not be used as the sole basis for treatment or other patient management decisions. A negative result may occur with  improper specimen collection/handling, submission of specimen other than nasopharyngeal swab, presence of viral mutation(s) within the areas targeted by this assay, and inadequate number of viral copies(<138 copies/mL). A negative result must be combined with clinical observations, patient history, and epidemiological information. The expected result is  Negative.  Fact Sheet for Patients:  bloggercourse.com  Fact Sheet for Healthcare Providers:  seriousbroker.it  This test is no t yet approved or cleared by the [place]  FDA and  has been authorized for detection and/or diagnosis of SARS-CoV-2 by FDA under an Emergency Use Authorization (EUA). This EUA will remain  in effect (meaning this test can be used) for the duration of the COVID-19 declaration under Section 564(b)(1) of the Act, 21 U.S.C.section 360bbb-3(b)(1), unless the authorization is terminated  or revoked sooner.       Influenza A by PCR NEGATIVE NEGATIVE Final   Influenza B by PCR NEGATIVE NEGATIVE Final    Comment: (NOTE) The Xpert Xpress SARS-CoV-2/FLU/RSV plus assay is intended as an aid in the diagnosis of influenza from Nasopharyngeal swab specimens and should not be used as a sole basis for treatment. Nasal washings and aspirates are unacceptable for Xpert Xpress SARS-CoV-2/FLU/RSV testing.  Fact Sheet for Patients: bloggercourse.com  Fact Sheet for Healthcare Providers: seriousbroker.it  This test is not yet approved or cleared by the [place]  FDA and has been authorized for detection and/or diagnosis of SARS-CoV-2 by FDA under an Emergency Use Authorization (EUA). This EUA will remain in effect (meaning this test can  be used) for the duration of the COVID-19 declaration under Section 564(b)(1) of the Act, 21 U.S.C. section 360bbb-3(b)(1), unless the authorization is terminated or revoked.     Resp Syncytial Virus by PCR NEGATIVE NEGATIVE Final    Comment: (NOTE) Fact Sheet for Patients: bloggercourse.com  Fact Sheet for Healthcare Providers: seriousbroker.it  This test is not yet approved or cleared by the [place]  FDA and has been authorized for detection and/or diagnosis of  SARS-CoV-2 by FDA under an Emergency Use Authorization (EUA). This EUA will remain in effect (meaning this test can be used) for the duration of the COVID-19 declaration under Section 564(b)(1) of the Act, 21 U.S.C. section 360bbb-3(b)(1), unless the authorization is terminated or revoked.  Performed at [facility], [address], [place], [place] [address]          Radiology Studies: MR BRAIN W WO CONTRAST Result Date: 09/22/2023 CLINICAL DATA:  Initial evaluation for demyelinating disease. No other relevant history provided. EXAM: MRI HEAD WITHOUT AND WITH CONTRAST TECHNIQUE: Multiplanar, multiecho pulse sequences of the brain and surrounding structures were obtained without and with intravenous contrast. CONTRAST:  10mL GADAVIST  GADOBUTROL  1 MMOL/ML IV SOLN COMPARISON:  CT from 12/24/2021 and MRI from 09/19/2017. FINDINGS: Brain: Diffuse prominence of the CSF containing spaces compatible with generalized cerebral atrophy. Mild T2/FLAIR hyperintensity involving the periventricular white matter and pons, most characteristic of chronic microvascular ischemic disease, mild for age. Few small remote lacunar infarcts noted about the thalami. No other parenchymal changes to suggest demyelinating disease. No evidence for acute or subacute infarct. No areas of chronic cortical infarction. No acute or chronic intracranial blood products. No mass lesion, midline shift or mass effect. Diffuse ventricular prominence most likely related to global parenchymal volume loss. No hydrocephalus. No extra-axial fluid collection. Pituitary gland suprasellar region within normal limits. No abnormal enhancement. Vascular: Major intracranial vascular flow voids are maintained. Skull and upper cervical spine: Craniocervical junction within normal limits. Bone marrow signal intensity normal. No acute scalp soft tissue abnormality. Subcentimeter nonenhancing nodular lesion noted at the left parietal  scalp, likely benign and of doubtful significance. Sinuses/Orbits: Prior bilateral ocular lens replacement. Paranasal sinuses are largely clear. Moderate right mastoid effusion noted. Negative nasopharynx. Other: None. IMPRESSION: 1. No acute intracranial abnormality. 2. Moderately advanced cerebral atrophy with mild chronic microvascular ischemic disease. Few small remote lacunar infarcts about the thalami. 3. Moderate right mastoid effusion. Electronically Signed   By: Morene Hoard M.D.   On: 09/22/2023 01:13   MR Lumbar Spine W Wo Contrast Result Date: 09/22/2023 CLINICAL DATA:  Initial evaluation for epidural abscess versus cauda equina. No other relevant history is provided. EXAM: MRI LUMBAR SPINE WITHOUT AND WITH CONTRAST TECHNIQUE: Multiplanar and multiecho pulse sequences of the lumbar spine were obtained without and with intravenous contrast. CONTRAST:  10mL GADAVIST  GADOBUTROL  1 MMOL/ML IV SOLN COMPARISON:  MRI from 07/20/2017. FINDINGS: Segmentation: Standard. Lowest well-formed disc space labeled the L5-S1 level. Alignment: Chronic 4 mm anterolisthesis of L4 on L5. Trace 2 mm retrolisthesis of L3 on L4. Vertebrae: Postoperative changes from prior PLIF at L4-5. Vertebral body height maintained without acute or chronic fracture. Bone marrow signal intensity within normal limits. No worrisome osseous lesions. No evidence for osteomyelitis discitis or septic arthritis. Conus medullaris and cauda equina: Conus extends to the L1 level. Conus and cauda equina appear normal. No abnormal enhancement or epidural collections. Paraspinal and other soft tissues: Paraspinous soft tissues within normal limits. Subcentimeter simple right renal cyst noted,  of doubtful significance, no follow-up imaging recommended. Disc levels: L1-2: Disc desiccation with mild disc bulge. Superimposed right extraforaminal disc protrusion closely approximates the exiting right L1 nerve root. Mild facet spurring. No spinal  stenosis. Foramina remain patent. L2-3: Disc desiccation with mild disc bulge. Mild reactive endplate spurring. No spinal stenosis. Mild to moderate bilateral L2 foraminal narrowing. L3-4: Disc desiccation with mild disc bulge. Reactive endplate spurring. Superimposed small right foraminal to extraforaminal disc protrusion closely approximates the exiting right L3 nerve root. Mild bilateral facet hypertrophy. No spinal stenosis. Mild to moderate bilateral L3 foraminal stenosis. L4-5: Prior PLIF. Moderate right greater than left facet hypertrophy with residual mild narrowing of the lateral recesses bilaterally, slightly worse on the right. Central canal remains patent. Mild bilateral L4 foraminal stenosis. L5-S1: Disc desiccation with mild disc bulge. Mild reactive endplate spurring. No spinal stenosis. Foramina remain patent. IMPRESSION: 1. No acute abnormality within the lumbar spine. 2. Prior PLIF at L4-5. Residual mild bilateral lateral recess stenosis and mild bilateral L4 foraminal narrowing, largely due to facet disease. 3. Right foraminal to extraforaminal disc protrusions at L1-2 and L3-4, potentially affecting the exiting right L1 and L3 nerve roots respectively. 4. Mild to moderate bilateral L2 and L3 foraminal stenosis related to disc bulge and facet hypertrophy. Electronically Signed   By: Morene Hoard M.D.   On: 09/22/2023 00:52        Scheduled Meds:  aspirin  EC  81 mg Oral Daily   atorvastatin   80 mg Oral Daily   buPROPion   300 mg Oral Daily   carvedilol   6.25 mg Oral BID WC   cyanocobalamin   1,000 mcg Oral Daily   DULoxetine   60 mg Oral QPM   heparin   5,000 Units Subcutaneous Q8H   insulin  aspart  0-15 Units Subcutaneous TID WC   insulin  aspart  0-5 Units Subcutaneous QHS   lidocaine   1 patch Transdermal Q24H   losartan   100 mg Oral Daily   Continuous Infusions:        Sophie Mao, MD [facility] 09/23/2023, 10:16 AM

## 2023-09-24 DIAGNOSIS — L89313 Pressure ulcer of right buttock, stage 3: Secondary | ICD-10-CM | POA: Diagnosis present

## 2023-09-24 DIAGNOSIS — E785 Hyperlipidemia, unspecified: Secondary | ICD-10-CM | POA: Diagnosis present

## 2023-09-24 DIAGNOSIS — Z7982 Long term (current) use of aspirin: Secondary | ICD-10-CM | POA: Diagnosis not present

## 2023-09-24 DIAGNOSIS — Z96653 Presence of artificial knee joint, bilateral: Secondary | ICD-10-CM | POA: Diagnosis present

## 2023-09-24 DIAGNOSIS — Z1152 Encounter for screening for COVID-19: Secondary | ICD-10-CM | POA: Diagnosis not present

## 2023-09-24 DIAGNOSIS — N390 Urinary tract infection, site not specified: Secondary | ICD-10-CM | POA: Diagnosis present

## 2023-09-24 DIAGNOSIS — Z7984 Long term (current) use of oral hypoglycemic drugs: Secondary | ICD-10-CM | POA: Diagnosis not present

## 2023-09-24 DIAGNOSIS — L89890 Pressure ulcer of other site, unstageable: Secondary | ICD-10-CM | POA: Diagnosis present

## 2023-09-24 DIAGNOSIS — I5022 Chronic systolic (congestive) heart failure: Secondary | ICD-10-CM | POA: Diagnosis present

## 2023-09-24 DIAGNOSIS — L89323 Pressure ulcer of left buttock, stage 3: Secondary | ICD-10-CM | POA: Diagnosis present

## 2023-09-24 DIAGNOSIS — Z7401 Bed confinement status: Secondary | ICD-10-CM | POA: Diagnosis not present

## 2023-09-24 DIAGNOSIS — L89312 Pressure ulcer of right buttock, stage 2: Secondary | ICD-10-CM | POA: Diagnosis not present

## 2023-09-24 DIAGNOSIS — Z7189 Other specified counseling: Secondary | ICD-10-CM | POA: Diagnosis not present

## 2023-09-24 DIAGNOSIS — F32A Depression, unspecified: Secondary | ICD-10-CM | POA: Diagnosis present

## 2023-09-24 DIAGNOSIS — I2510 Atherosclerotic heart disease of native coronary artery without angina pectoris: Secondary | ICD-10-CM | POA: Diagnosis present

## 2023-09-24 DIAGNOSIS — N179 Acute kidney failure, unspecified: Secondary | ICD-10-CM | POA: Diagnosis present

## 2023-09-24 DIAGNOSIS — Z981 Arthrodesis status: Secondary | ICD-10-CM | POA: Diagnosis not present

## 2023-09-24 DIAGNOSIS — Z8616 Personal history of COVID-19: Secondary | ICD-10-CM | POA: Diagnosis not present

## 2023-09-24 DIAGNOSIS — M7989 Other specified soft tissue disorders: Secondary | ICD-10-CM | POA: Diagnosis not present

## 2023-09-24 DIAGNOSIS — B965 Pseudomonas (aeruginosa) (mallei) (pseudomallei) as the cause of diseases classified elsewhere: Secondary | ICD-10-CM | POA: Diagnosis present

## 2023-09-24 DIAGNOSIS — Z515 Encounter for palliative care: Secondary | ICD-10-CM | POA: Diagnosis not present

## 2023-09-24 DIAGNOSIS — E66811 Obesity, class 1: Secondary | ICD-10-CM | POA: Diagnosis present

## 2023-09-24 DIAGNOSIS — I252 Old myocardial infarction: Secondary | ICD-10-CM | POA: Diagnosis not present

## 2023-09-24 DIAGNOSIS — B999 Unspecified infectious disease: Secondary | ICD-10-CM | POA: Diagnosis not present

## 2023-09-24 DIAGNOSIS — R5381 Other malaise: Secondary | ICD-10-CM | POA: Diagnosis not present

## 2023-09-24 DIAGNOSIS — E876 Hypokalemia: Secondary | ICD-10-CM | POA: Diagnosis present

## 2023-09-24 DIAGNOSIS — E1143 Type 2 diabetes mellitus with diabetic autonomic (poly)neuropathy: Secondary | ICD-10-CM | POA: Diagnosis present

## 2023-09-24 DIAGNOSIS — I110 Hypertensive heart disease with heart failure: Secondary | ICD-10-CM | POA: Diagnosis present

## 2023-09-24 LAB — GLUCOSE, CAPILLARY
Glucose-Capillary: 117 mg/dL — ABNORMAL HIGH (ref 70–99)
Glucose-Capillary: 157 mg/dL — ABNORMAL HIGH (ref 70–99)
Glucose-Capillary: 101 mg/dL — ABNORMAL HIGH (ref 70–99)
Glucose-Capillary: 133 mg/dL — ABNORMAL HIGH (ref 70–99)

## 2023-09-24 LAB — BASIC METABOLIC PANEL
Anion gap: 10 (ref 5–15)
BUN: 21 mg/dL (ref 8–23)
CO2: 27 mmol/L (ref 22–32)
Calcium: 8.8 mg/dL — ABNORMAL LOW (ref 8.9–10.3)
Chloride: 100 mmol/L (ref 98–111)
Creatinine, Ser: 1.52 mg/dL — ABNORMAL HIGH (ref 0.61–1.24)
GFR, Estimated: 48 mL/min — ABNORMAL LOW (ref >60)
Glucose, Bld: 112 mg/dL — ABNORMAL HIGH (ref 70–99)
Potassium: 3.9 mmol/L (ref 3.5–5.1)
Sodium: 137 mmol/L (ref 135–145)

## 2023-09-24 LAB — MAGNESIUM: Magnesium: 1.6 mg/dL — ABNORMAL LOW (ref 1.7–2.4)

## 2023-09-24 MED ORDER — GERHARDT'S BUTT CREAM
TOPICAL_CREAM | Freq: Two times a day (BID) | CUTANEOUS | Status: DC
  Filled 2023-09-24: qty 60

## 2023-09-24 MED ORDER — MEDIHONEY WOUND/BURN DRESSING EX PSTE
1.0000 | PASTE | Freq: Every day | CUTANEOUS | Status: DC
  Administered 2023-09-24 – 2023-10-05 (×12): 1 via TOPICAL
  Filled 2023-09-24 (×2): qty 44

## 2023-09-24 MED ORDER — SODIUM CHLORIDE 0.9 % IV SOLN: INTRAVENOUS | Status: DC

## 2023-09-24 NOTE — TOC Progression Note (Signed)
 Transition of Care [facility]) - Progression Note    Patient Details  Name: Devin Combs MRN: [id number] Date of Birth: May 24, 1951  Transition of Care [facility]) CM/SW Contact  Dane Dung, RN Phone Number: 09/24/2023, 9:09 AM  Clinical Narrative:    CM received a secure voicemail from Mauricia South, APS SW - for follow up regarding placement for the patient.  Felipe Horton, APS SW states that the patient was unable to care for the wife in the home and that patient will need SNF placement.  APS will continue to follow the patient - Mauricia South, APS - [phone].        Expected Discharge Plan and Services                                               Social Determinants of Health (SDOH) Interventions SDOH Screenings   Food Insecurity: No Food Insecurity (09/22/2023)  Housing: Low Risk  (09/22/2023)  Transportation Needs: Unmet Transportation Needs (09/22/2023)  Utilities: Not At Risk (09/22/2023)  Social Connections: Socially Isolated (09/22/2023)  Tobacco Use: Low Risk  (09/22/2023)    Readmission Risk Interventions     No data to display

## 2023-09-24 NOTE — TOC Progression Note (Signed)
 Transition of Care [facility]) - Progression Note    Patient Details  Name: Devin Combs MRN: [id number] Date of Birth: 1951-03-14  [facility]) CM/SW Contact  Teige Rountree A [place], LCSW Phone Number: 09/24/2023, 11:47 AM  Clinical Narrative:     CSW met with pt and pt's spouse at bedside to provide bed offers with Medicare.gov ratings. They stated they would review facilities and let CSW know of decision. CSW to keep APS social worker Ruthie Cowman updated as well.    TOC will continue to follow.        Expected Discharge Plan and Services                                               Social Determinants of Health (SDOH) Interventions SDOH Screenings   Food Insecurity: No Food Insecurity (09/22/2023)  Housing: Low Risk  (09/22/2023)  Transportation Needs: Unmet Transportation Needs (09/22/2023)  Utilities: Not At Risk (09/22/2023)  Social Connections: Socially Isolated (09/22/2023)  Tobacco Use: Low Risk  (09/22/2023)    Readmission Risk Interventions     No data to display

## 2023-09-24 NOTE — Consult Note (Signed)
 WOC Nurse Consult Note: this consult performed remotely utilizing EMR including photo documentation  Reason for Consult: multiple pressure injuries  Wound type: 1.  Stage 3 pressure injury R buttock and L posterior thigh  2.  Unstageable pressure injury R trochanter  Pressure Injury POA: Yes Measurement: per nursing flowsheet  Wound bed: R buttock 75% pink moist 25% yellow; L posterior thigh 75% pink moist 25% yellow; R trochanter 100% yellow  Drainage (amount, consistency, odor) per nursing flowsheet  Periwound: erythema  Dressing procedure/placement/frequency: Cleanse buttocks wounds, R hip and L posterior thigh with NS, apply Medihoney to wound bed daily, cover with dry gauze and silicone foam or ABD pad to secure whichever is preferred.   Will order Gerhardt's Butt Cream 2 times a day for skin surrounding buttocks wounds.   POC discussed with bedside nurse. WOC team will not follow. Re-consult if further needs arise.   Thank you,    Ronni Colace MSN, RN-BC, [employer] [phone]

## 2023-09-24 NOTE — Plan of Care (Signed)
   Problem: Metabolic: Goal: Ability to maintain appropriate glucose levels will improve Outcome: Progressing   Problem: Nutritional: Goal: Maintenance of adequate nutrition will improve Outcome: Progressing   Problem: Nutritional: Goal: Progress toward achieving an optimal weight will improve Outcome: Progressing

## 2023-09-24 NOTE — Progress Notes (Signed)
 PROGRESS NOTE    Devin Combs  WUJ:[id number] DOB: Mar 10, 1951 DOA: 09/21/2023 PCP: Vidal Graven, MD   Brief Narrative:  73 year old Caucasian male prior history of autonomic dysfunction, prior history of COVID with long COVID symptoms, history of hypertension, coronary artery disease, chronic back pain with a history of lumbar fusion, bedridden for the last 2 years presented with worsening decubitus ulcers and wife requesting that patient be placed into rehab facility.  On presentation, MRI of brain did not show any acute intracranial abnormity.  Lumbar spine did not show acute abnormality in the lumbar spine.  PT recommended SNF placement.  TOC consulted.  Assessment & Plan:   Decubitus ulcers: Present on admission on right and left buttocks, left posterior thigh -Continue local care.  Wound care consult.  No signs of infection.  Hypokalemia -Resolved  Hypomagnesemia -Resolved  Acute kidney injury -Creatinine 1.43 on 09/23/2023.  Pending today.  Encourage oral intake.  Repeat a.m. labs. Start gentle hydration. Losartan  held.  Hypertension Hyperlipidemia -Continue Coreg .  Keep losartan  on hold.  Continue statin  Diabetes mellitus type 2  -Continue CBGs with SSI.  Hold metformin .  Bedbound History of autonomic dysfunction -PT recommending SNF placement.  TOC consulted  Depression -Continue bupropion  and duloxetine .  Outpatient follow-up with PCP  Goals of care -Overall prognosis is guarded to poor.  Palliative care consultation pending.  Obesity class I -Outpatient follow-up  Leukocytosis -Resolved   DVT prophylaxis: Subcutaneous heparin  Code Status: Full Family Communication: Wife at bedside Disposition Plan: Status is: Observation The patient will require care spanning > 2 midnights and should be moved to inpatient because: Of severity of illness   Consultants: Palliative care  Procedures: None  Antimicrobials: None   Subjective: Patient seen and  examined at bedside.  No agitation, fever, vomiting reported. Objective: Vitals:   09/23/23 0753 09/23/23 1558 09/23/23 2028 09/24/23 0335  BP: (!) 132/47 107/67 109/67 128/72  Pulse: 83 67 72 78  Resp: 18 16 18 18   Temp: 97.7 F (36.5 C)  98 F (36.7 C) 98 F (36.7 C)  TempSrc: Oral  Oral Oral  SpO2: 96% 95% 97% 94%  Weight:      Height:       No intake or output data in the 24 hours ending 09/24/23 0640 Filed Weights   09/22/23 0642  Weight: 107.4 kg    Examination:  General: Currently on room air.  No distress.  Chronically ill and deconditioned looking ENT/neck: No thyromegaly.  JVD is not elevated  respiratory: Decreased breath sounds at bases bilaterally with some crackles; no wheezing  CVS: S1-S2 heard, rate controlled currently Abdominal: Soft, obese, nontender, slightly distended; no organomegaly, bowel sounds are heard Extremities: Trace lower extremity edema; no cyanosis  CNS: Awake and alert.  Still slow to respond and a poor historian.   Lymph: No obvious lymphadenopathy Skin: No obvious ecchymosis/lesions  psych: Mostly flat affect.  Currently not agitated.   Musculoskeletal: No obvious joint swelling/deformity     Data Reviewed: I have personally reviewed following labs and imaging studies  CBC: Recent Labs  Lab 09/21/23 2037 09/23/23 0708  WBC 14.3* 10.1  NEUTROABS 12.2* 8.2*  HGB 13.5 12.5*  HCT 40.7 37.3*  MCV 86.6 86.7  PLT 349 274   Basic Metabolic Panel: Recent Labs  Lab 09/21/23 2037 09/22/23 1000 09/23/23 0708  NA 137 136 136  K 2.7* 4.2 3.6  CL 99 101 102  CO2 25 25 25   GLUCOSE 110* 184* 142*  BUN 10  10 15  CREATININE 0.63 0.75 1.43*  CALCIUM  8.5* 8.4* 8.4*  MG 1.2* 1.8 1.7   GFR: Estimated Creatinine Clearance: 60 mL/min (A) (by C-G formula based on SCr of 1.43 mg/dL (H)). Liver Function Tests: Recent Labs  Lab 09/23/23 0708  AST 17  ALT 15  ALKPHOS 107  BILITOT 0.6  PROT 5.2*  ALBUMIN 2.2*   No results for  input(s): "LIPASE", "AMYLASE" in the last 168 hours. No results for input(s): "AMMONIA" in the last 168 hours. Coagulation Profile: No results for input(s): "INR", "PROTIME" in the last 168 hours. Cardiac Enzymes: No results for input(s): "CKTOTAL", "CKMB", "CKMBINDEX", "TROPONINI" in the last 168 hours. BNP (last 3 results) No results for input(s): "PROBNP" in the last 8760 hours. HbA1C: Recent Labs    09/22/23 1000  HGBA1C 5.4   CBG: Recent Labs  Lab 09/22/23 1950 09/23/23 0806 09/23/23 1200 09/23/23 1639 09/23/23 2030  GLUCAP 150* 129* 157* 156* 113*   Lipid Profile: Recent Labs    09/22/23 1000  CHOL 115  HDL 30*  LDLCALC 68  TRIG 83  CHOLHDL 3.8   Thyroid  Function Tests: Recent Labs    09/22/23 1000  TSH 2.227   Anemia Panel: No results for input(s): "VITAMINB12", "FOLATE", "FERRITIN", "TIBC", "IRON", "RETICCTPCT" in the last 72 hours. Sepsis Labs: No results for input(s): "PROCALCITON", "LATICACIDVEN" in the last 168 hours.  Recent Results (from the past 240 hours)  Resp panel by RT-PCR (RSV, Flu A&B, Covid) Anterior Nasal Swab     Status: None   Collection Time: 09/21/23  8:16 PM   Specimen: Anterior Nasal Swab  Result Value Ref Range Status   SARS Coronavirus 2 by RT PCR NEGATIVE NEGATIVE Final    Comment: (NOTE) SARS-CoV-2 target nucleic acids are NOT DETECTED.  The SARS-CoV-2 RNA is generally detectable in upper respiratory specimens during the acute phase of infection. The lowest concentration of SARS-CoV-2 viral copies this assay can detect is 138 copies/mL. A negative result does not preclude SARS-Cov-2 infection and should not be used as the sole basis for treatment or other patient management decisions. A negative result may occur with  improper specimen collection/handling, submission of specimen other than nasopharyngeal swab, presence of viral mutation(s) within the areas targeted by this assay, and inadequate number of  viral copies(<138 copies/mL). A negative result must be combined with clinical observations, patient history, and epidemiological information. The expected result is Negative.  Fact Sheet for Patients:  BloggerCourse.com  Fact Sheet for Healthcare Providers:  SeriousBroker.it  This test is no t yet approved or cleared by the [place]  FDA and  has been authorized for detection and/or diagnosis of SARS-CoV-2 by FDA under an Emergency Use Authorization (EUA). This EUA will remain  in effect (meaning this test can be used) for the duration of the COVID-19 declaration under Section 564(b)(1) of the Act, 21 U.S.C.section 360bbb-3(b)(1), unless the authorization is terminated  or revoked sooner.       Influenza A by PCR NEGATIVE NEGATIVE Final   Influenza B by PCR NEGATIVE NEGATIVE Final    Comment: (NOTE) The Xpert Xpress SARS-CoV-2/FLU/RSV plus assay is intended as an aid in the diagnosis of influenza from Nasopharyngeal swab specimens and should not be used as a sole basis for treatment. Nasal washings and aspirates are unacceptable for Xpert Xpress SARS-CoV-2/FLU/RSV testing.  Fact Sheet for Patients: BloggerCourse.com  Fact Sheet for Healthcare Providers: SeriousBroker.it  This test is not yet approved or cleared by the [place]  FDA and  has been authorized for detection and/or diagnosis of SARS-CoV-2 by FDA under an Emergency Use Authorization (EUA). This EUA will remain in effect (meaning this test can be used) for the duration of the COVID-19 declaration under Section 564(b)(1) of the Act, 21 U.S.C. section 360bbb-3(b)(1), unless the authorization is terminated or revoked.     Resp Syncytial Virus by PCR NEGATIVE NEGATIVE Final    Comment: (NOTE) Fact Sheet for Patients: BloggerCourse.com  Fact Sheet for Healthcare  Providers: SeriousBroker.it  This test is not yet approved or cleared by the [place]  FDA and has been authorized for detection and/or diagnosis of SARS-CoV-2 by FDA under an Emergency Use Authorization (EUA). This EUA will remain in effect (meaning this test can be used) for the duration of the COVID-19 declaration under Section 564(b)(1) of the Act, 21 U.S.C. section 360bbb-3(b)(1), unless the authorization is terminated or revoked.  Performed at [facility], [address], [place], [place] [address]          Radiology Studies: No results found.       Scheduled Meds:  aspirin  EC  81 mg Oral Daily   atorvastatin   80 mg Oral Daily   buPROPion   300 mg Oral Daily   carvedilol   6.25 mg Oral BID WC   cyanocobalamin   1,000 mcg Oral Daily   DULoxetine   60 mg Oral QPM   heparin   5,000 Units Subcutaneous Q8H   insulin  aspart  0-15 Units Subcutaneous TID WC   insulin  aspart  0-5 Units Subcutaneous QHS   lidocaine   1 patch Transdermal Q24H   Continuous Infusions:        Audria Leather, MD [facility] 09/24/2023, 6:40 AM

## 2023-09-25 DIAGNOSIS — E876 Hypokalemia: Secondary | ICD-10-CM | POA: Diagnosis not present

## 2023-09-25 DIAGNOSIS — B999 Unspecified infectious disease: Secondary | ICD-10-CM | POA: Diagnosis not present

## 2023-09-25 DIAGNOSIS — Z7189 Other specified counseling: Secondary | ICD-10-CM | POA: Diagnosis not present

## 2023-09-25 DIAGNOSIS — L89312 Pressure ulcer of right buttock, stage 2: Secondary | ICD-10-CM | POA: Diagnosis not present

## 2023-09-25 DIAGNOSIS — M7989 Other specified soft tissue disorders: Secondary | ICD-10-CM

## 2023-09-25 DIAGNOSIS — Z515 Encounter for palliative care: Secondary | ICD-10-CM

## 2023-09-25 LAB — MAGNESIUM: Magnesium: 1.4 mg/dL — ABNORMAL LOW (ref 1.7–2.4)

## 2023-09-25 LAB — BASIC METABOLIC PANEL
Anion gap: 14 (ref 5–15)
BUN: 19 mg/dL (ref 8–23)
CO2: 24 mmol/L (ref 22–32)
Calcium: 9 mg/dL (ref 8.9–10.3)
Chloride: 101 mmol/L (ref 98–111)
Creatinine, Ser: 1.26 mg/dL — ABNORMAL HIGH (ref 0.61–1.24)
GFR, Estimated: >60 mL/min (ref >60)
Glucose, Bld: 116 mg/dL — ABNORMAL HIGH (ref 70–99)
Potassium: 4 mmol/L (ref 3.5–5.1)
Sodium: 139 mmol/L (ref 135–145)

## 2023-09-25 LAB — GLUCOSE, CAPILLARY
Glucose-Capillary: 110 mg/dL — ABNORMAL HIGH (ref 70–99)
Glucose-Capillary: 175 mg/dL — ABNORMAL HIGH (ref 70–99)
Glucose-Capillary: 130 mg/dL — ABNORMAL HIGH (ref 70–99)
Glucose-Capillary: 129 mg/dL — ABNORMAL HIGH (ref 70–99)

## 2023-09-25 MED ORDER — MAGNESIUM SULFATE 2 GM/50ML IV SOLN
2.0000 g | Freq: Once | INTRAVENOUS | Status: AC
  Administered 2023-09-25: 2 g via INTRAVENOUS
  Filled 2023-09-25: qty 50

## 2023-09-25 MED ORDER — CARBAMIDE PEROXIDE 6.5 % OT SOLN
5.0000 [drp] | Freq: Two times a day (BID) | OTIC | Status: DC
  Administered 2023-09-25 – 2023-09-26 (×2): 5 [drp] via OTIC
  Filled 2023-09-25 (×2): qty 15

## 2023-09-25 MED ORDER — ORAL CARE MOUTH RINSE: 15.0000 mL | OROMUCOSAL | Status: DC | PRN

## 2023-09-25 NOTE — Plan of Care (Signed)
  Problem: Health Behavior/Discharge Planning: Goal: Ability to manage health-related needs will improve Outcome: Progressing   Problem: Metabolic: Goal: Ability to maintain appropriate glucose levels will improve Outcome: Progressing   Problem: Nutritional: Goal: Maintenance of adequate nutrition will improve Outcome: Progressing

## 2023-09-25 NOTE — Progress Notes (Signed)
PROGRESS NOTE    Devin Combs  GEX:[id number] DOB: 01-Jan-1951 DOA: 09/21/2023 PCP: Elias Else, MD   Brief Narrative:  73 year old Caucasian male prior history of autonomic dysfunction, prior history of COVID with long COVID symptoms, history of hypertension, coronary artery disease, chronic back pain with a history of lumbar fusion, bedridden for the last 2 years presented with worsening decubitus ulcers and wife requesting that patient be placed into rehab facility.  On presentation, MRI of brain did not show any acute intracranial abnormity.  Lumbar spine did not show acute abnormality in the lumbar spine.  PT recommended SNF placement.  TOC consulted.  Assessment & Plan:   Decubitus ulcers: Present on admission on right and left buttocks, left posterior thigh -Continue local care.  Wound care consult.  No signs of infection.  Hypokalemia -Resolved  Hypomagnesemia -Replace.  Repeat a.m. labs  Acute kidney injury -Creatinine 1.52 on 09/24/2023.  Creatinine 1.26 today.  Encourage oral intake.  Repeat a.m. labs.  Currently on gentle hydration.  DC IV fluids.  Losartan held.  Hypertension Hyperlipidemia -Continue Coreg.  Keep losartan on hold.  Continue statin  Diabetes mellitus type 2  -Continue CBGs with SSI.  Hold metformin.  Bedbound History of autonomic dysfunction -PT recommending SNF placement.  TOC consulted  Depression -Continue bupropion and duloxetine.  Outpatient follow-up with PCP  Goals of care -Overall prognosis is guarded to poor.  Palliative care consultation pending.  Obesity class I -Outpatient follow-up  Leukocytosis -Resolved   DVT prophylaxis: Subcutaneous heparin Code Status: Full Family Communication: Wife at bedside Disposition Plan: Status is: inpatient because: Of severity of illness.  SNF placement is pending   Consultants: Palliative care  Procedures: None  Antimicrobials: None   Subjective: Patient seen and examined at  bedside.  No fever, vomiting, agitation, seizures reported.   Objective: Vitals:   09/24/23 0742 09/24/23 1651 09/24/23 2015 09/25/23 0511  BP: (!) 148/73 (!) 147/78 (!) 158/87 106/60  Pulse: 77 69 72 68  Resp: 18 18 18 18   Temp: 97.9 F (36.6 C)  97.6 F (36.4 C) 97.8 F (36.6 C)  TempSrc:   Oral Oral  SpO2: 94% 95% 97% 95%  Weight:      Height:        Intake/Output Summary (Last 24 hours) at 09/25/2023 0643 Last data filed at 09/25/2023 0102 Gross per 24 hour  Intake --  Output 2375 ml  Net -2375 ml   Filed Weights   09/22/23 0642  Weight: 107.4 kg    Examination:  General: On room air.  No acute distress.  Looks chronically ill and deconditioned. respiratory: Bilateral decreased breath sounds at bases bilaterally with scattered crackles CVS: Currently rate controlled; S1-S2 heard  abdominal: Soft, obese, nontender, distended mildly, no organomegaly; normal bowel sounds are heard  extremities: No clubbing.  Mild lower extremity edema present  Data Reviewed: I have personally reviewed following labs and imaging studies  CBC: Recent Labs  Lab 09/21/23 2037 09/23/23 0708  WBC 14.3* 10.1  NEUTROABS 12.2* 8.2*  HGB 13.5 12.5*  HCT 40.7 37.3*  MCV 86.6 86.7  PLT 349 274   Basic Metabolic Panel: Recent Labs  Lab 09/21/23 2037 09/22/23 1000 09/23/23 0708 09/24/23 0623  NA 137 136 136 137  K 2.7* 4.2 3.6 3.9  CL 99 101 102 100  CO2 25 25 25 27   GLUCOSE 110* 184* 142* 112*  BUN 10 10 15 21   CREATININE 0.63 0.75 1.43* 1.52*  CALCIUM 8.5* 8.4* 8.4* 8.8*  MG 1.2* 1.8 1.7 1.6*   GFR: Estimated Creatinine Clearance: 56.5 mL/min (A) (by C-G formula based on SCr of 1.52 mg/dL (H)). Liver Function Tests: Recent Labs  Lab 09/23/23 0708  AST 17  ALT 15  ALKPHOS 107  BILITOT 0.6  PROT 5.2*  ALBUMIN 2.2*   No results for input(s): "LIPASE", "AMYLASE" in the last 168 hours. No results for input(s): "AMMONIA" in the last 168 hours. Coagulation Profile: No  results for input(s): "INR", "PROTIME" in the last 168 hours. Cardiac Enzymes: No results for input(s): "CKTOTAL", "CKMB", "CKMBINDEX", "TROPONINI" in the last 168 hours. BNP (last 3 results) No results for input(s): "PROBNP" in the last 8760 hours. HbA1C: Recent Labs    09/22/23 1000  HGBA1C 5.4   CBG: Recent Labs  Lab 09/23/23 2030 09/24/23 0741 09/24/23 1211 09/24/23 1652 09/24/23 2025  GLUCAP 113* 117* 157* 101* 133*   Lipid Profile: Recent Labs    09/22/23 1000  CHOL 115  HDL 30*  LDLCALC 68  TRIG 83  CHOLHDL 3.8   Thyroid Function Tests: Recent Labs    09/22/23 1000  TSH 2.227   Anemia Panel: No results for input(s): "VITAMINB12", "FOLATE", "FERRITIN", "TIBC", "IRON", "RETICCTPCT" in the last 72 hours. Sepsis Labs: No results for input(s): "PROCALCITON", "LATICACIDVEN" in the last 168 hours.  Recent Results (from the past 240 hours)  Resp panel by RT-PCR (RSV, Flu A&B, Covid) Anterior Nasal Swab     Status: None   Collection Time: 09/21/23  8:16 PM   Specimen: Anterior Nasal Swab  Result Value Ref Range Status   SARS Coronavirus 2 by RT PCR NEGATIVE NEGATIVE Final    Comment: (NOTE) SARS-CoV-2 target nucleic acids are NOT DETECTED.  The SARS-CoV-2 RNA is generally detectable in upper respiratory specimens during the acute phase of infection. The lowest concentration of SARS-CoV-2 viral copies this assay can detect is 138 copies/mL. A negative result does not preclude SARS-Cov-2 infection and should not be used as the sole basis for treatment or other patient management decisions. A negative result may occur with  improper specimen collection/handling, submission of specimen other than nasopharyngeal swab, presence of viral mutation(s) within the areas targeted by this assay, and inadequate number of viral copies(<138 copies/mL). A negative result must be combined with clinical observations, patient history, and epidemiological information. The  expected result is Negative.  Fact Sheet for Patients:  BloggerCourse.com  Fact Sheet for Healthcare Providers:  SeriousBroker.it  This test is no t yet approved or cleared by the [place] FDA and  has been authorized for detection and/or diagnosis of SARS-CoV-2 by FDA under an Emergency Use Authorization (EUA). This EUA will remain  in effect (meaning this test can be used) for the duration of the COVID-19 declaration under Section 564(b)(1) of the Act, 21 U.S.C.section 360bbb-3(b)(1), unless the authorization is terminated  or revoked sooner.       Influenza A by PCR NEGATIVE NEGATIVE Final   Influenza B by PCR NEGATIVE NEGATIVE Final    Comment: (NOTE) The Xpert Xpress SARS-CoV-2/FLU/RSV plus assay is intended as an aid in the diagnosis of influenza from Nasopharyngeal swab specimens and should not be used as a sole basis for treatment. Nasal washings and aspirates are unacceptable for Xpert Xpress SARS-CoV-2/FLU/RSV testing.  Fact Sheet for Patients: BloggerCourse.com  Fact Sheet for Healthcare Providers: SeriousBroker.it  This test is not yet approved or cleared by the [place] FDA and has been authorized for detection and/or diagnosis of SARS-CoV-2 by FDA under  an Emergency Use Authorization (EUA). This EUA will remain in effect (meaning this test can be used) for the duration of the COVID-19 declaration under Section 564(b)(1) of the Act, 21 U.S.C. section 360bbb-3(b)(1), unless the authorization is terminated or revoked.     Resp Syncytial Virus by PCR NEGATIVE NEGATIVE Final    Comment: (NOTE) Fact Sheet for Patients: BloggerCourse.com  Fact Sheet for Healthcare Providers: SeriousBroker.it  This test is not yet approved or cleared by the [place] FDA and has been authorized for detection and/or  diagnosis of SARS-CoV-2 by FDA under an Emergency Use Authorization (EUA). This EUA will remain in effect (meaning this test can be used) for the duration of the COVID-19 declaration under Section 564(b)(1) of the Act, 21 U.S.C. section 360bbb-3(b)(1), unless the authorization is terminated or revoked.  Performed at [facility], [address] [place], [place] [address]          Radiology Studies: No results found.       Scheduled Meds:  aspirin EC  81 mg Oral Daily   atorvastatin  80 mg Oral Daily   buPROPion  300 mg Oral Daily   carvedilol  6.25 mg Oral BID WC   cyanocobalamin  1,000 mcg Oral Daily   DULoxetine  60 mg Oral QPM   Gerhardt's butt cream   Topical BID   heparin  5,000 Units Subcutaneous Q8H   insulin aspart  0-15 Units Subcutaneous TID WC   insulin aspart  0-5 Units Subcutaneous QHS   leptospermum manuka honey  1 Application Topical Daily   lidocaine  1 patch Transdermal Q24H   Continuous Infusions:  sodium chloride 75 mL/hr at 09/24/23 2304          Glade Lloyd, MD [facility] 09/25/2023, 6:43 AM

## 2023-09-25 NOTE — Progress Notes (Signed)
Physical Therapy Treatment Patient Details Name: Devin Combs MRN: [id number] DOB: Oct 05, 1950 Today's Date: 09/25/2023   History of Present Illness 73 year old Caucasian male presents 2/7 with complaints of recent fecal and urinary incontinence. Labs showed Hypokalemia, and Hypomagnesemia. He has also developed decubitus ulcers on the right hip, left buttock and left posterior thigh last week. Prior history of autonomic dysfunction, prior history of COVID with long COVID symptoms, history of hypertension, coronary artery disease, chronic back pain with a history of lumbar fusion, bedridden for the last 2 years,    PT Comments  Eager to participate. Focused on bed mobility independence with techniques to ease movement and reduce back and wound pain. Tolerated sitting EOB majority of session with BIL UE support, progressed to CGA. Holding steady worked on weight shifting and midline control. Able to attempt standing transitions but does not fully clear buttocks with Stedy today. Patient will continue to benefit from skilled physical therapy services to further improve independence with functional mobility. Patient will benefit from continued inpatient follow up therapy, <3 hours/day.     If plan is discharge home, recommend the following: A lot of help with bathing/dressing/bathroom;Assistance with cooking/housework;Direct supervision/assist for medications management;Direct supervision/assist for financial management;Assist for transportation;Help with stairs or ramp for entrance;Two people to help with walking and/or transfers   Can travel by private vehicle     No  Equipment Recommendations  None recommended by PT    Recommendations for Other Services       Precautions / Restrictions Precautions Precautions: Fall Recall of Precautions/Restrictions: Impaired Precaution/Restrictions Comments: bed sores Restrictions Weight Bearing Restrictions Per Provider Order: No     Mobility   Bed Mobility Overal bed mobility: Needs Assistance Bed Mobility: Rolling, Sidelying to Sit, Sit to Sidelying Rolling: Max assist, Used rails Sidelying to sit: Max assist, HOB elevated, Used rails   Sit to supine: Mod assist, HOB elevated, Used rails   General bed mobility comments: Max assist to Roll towards left a couple of times during session, using rail to pull. Max assist for trunk support to rise with assist to lower LEs but improved from last visit. Mod assist to lower onto Rt shoulder back into bed, and assisted LEs into bed. Max cues throughout.    Transfers Overall transfer level: Needs assistance Equipment used: Ambulation equipment used Transfers: Sit to/from Stand Sit to Stand: Total assist, From elevated surface, Via lift equipment           General transfer comment: Practiced initiating standing techniques with the use of a Stedy today. Lower back pain but able to pull himself forward with Stedy and push through LEs. Practiced 5 times, eventually demonstrating heels flat and reducing pressure through buttocks although unable to fully clear yet. Transfer via Lift Equipment: Stedy  Ambulation/Gait                   Stairs             Wheelchair Mobility     Tilt Bed    Modified Rankin (Stroke Patients Only)       Balance Overall balance assessment: Needs assistance Sitting-balance support: Bilateral upper extremity supported Sitting balance-Leahy Scale: Poor Sitting balance - Comments: Progressed from min assist to CGA with hands supported. Postural control: Posterior lean, Right lateral lean                                  Communication Communication  Communication: No apparent difficulties Factors Affecting Communication: Hearing impaired  Cognition Arousal: Alert Behavior During Therapy: WFL for tasks assessed/performed   PT - Cognitive impairments: Initiation, Sequencing, Problem solving                          Following commands: Impaired Following commands impaired: Only follows one step commands consistently, Follows one step commands with increased time    Cueing Cueing Techniques: Verbal cues, Gestural cues, Tactile cues  Exercises General Exercises - Lower Extremity Long Arc Quad: Strengthening, Both, Seated, 10 reps    General Comments        Pertinent Vitals/Pain Pain Assessment Pain Assessment: Faces Faces Pain Scale: Hurts even more Pain Location: back, pressure ulcers Pain Descriptors / Indicators: Aching, Sore Pain Intervention(s): Monitored during session, Repositioned    Home Living                          Prior Function            PT Goals (current goals can now be found in the care plan section) Acute Rehab PT Goals Patient Stated Goal: Be able to get into wheelchair, eventually walk, or golf. PT Goal Formulation: With patient/family Time For Goal Achievement: 10/06/23 Potential to Achieve Goals: Fair Progress towards PT goals: Progressing toward goals    Frequency    Min 1X/week      PT Plan      Co-evaluation              AM-PAC PT "6 Clicks" Mobility   Outcome Measure  Help needed turning from your back to your side while in a flat bed without using bedrails?: Total Help needed moving from lying on your back to sitting on the side of a flat bed without using bedrails?: A Lot Help needed moving to and from a bed to a chair (including a wheelchair)?: Total Help needed standing up from a chair using your arms (e.g., wheelchair or bedside chair)?: Total Help needed to walk in hospital room?: Total Help needed climbing 3-5 steps with a railing? : Total 6 Click Score: 7    End of Session   Activity Tolerance: Patient tolerated treatment well (Weakness) Patient left: in bed;with call bell/phone within reach;with bed alarm set;with family/visitor present Nurse Communication: Mobility status;Need for lift equipment PT Visit  Diagnosis: Muscle weakness (generalized) (M62.81);Difficulty in walking, not elsewhere classified (R26.2);Other symptoms and signs involving the nervous system (R29.898);Pain Pain - part of body:  (back, wounds)     Time: 6045-4098 PT Time Calculation (min) (ACUTE ONLY): 26 min  Charges:    $Therapeutic Activity: 23-37 mins PT General Charges $$ ACUTE PT VISIT: 1 Visit                     Kathlyn Sacramento, PT, DPT [facility]  Rehabilitation Services Physical Therapist Office: [phone] Website: [email or web address]    Devin Combs 09/25/2023, 1:19 PM

## 2023-09-25 NOTE — Consult Note (Signed)
Consultation Note Date: 09/25/2023   Patient Name: Devin Combs  DOB: October 22, 1950  MRN: [id number]  Age / Sex: 73 y.o., male  PCP: Elias Else, MD Referring Physician: Glade Lloyd, MD  Reason for Consultation: Establishing goals of care  HPI/Patient Profile: 73 y.o. male  with past medical history of autonomic dysfunction, COVID history with long COVID symptoms, HTN, CAD, diabetes, neuropathy, chronic back pain s/p lumbar fusion, depression, bedridden x 2 years admitted on 09/21/2023 with worsening decubitus and concern for increased care needs.   Clinical Assessment and Goals of Care: Consult received and extensive chart review completed. I met today with [name] and wife [name] at bedside. They share about Devin Combs's health journey with having heart attack and then his back surgery. He had significant back pain for many years and decades and finally had relief after surgery. He was doing well after his back surgery but then he caught COVID during his therapy appointment. He has struggled since COVID infection. He has been mostly in bed but has had strength in his arms and legs to move and help his wife to care for him. He lost much strength in his legs especially ~1 month ago. Wife has been trying to care for him at home but she has health issues limiting her physical capabilities. Wounds have developed over past month along with increased leg edema per wife (last ECHO 02/23/20 - repeat needed?). They have really struggled over the past month and they have been trying to get help and get him in rehab facility with no success. [name] reports that she spoke with EMS about going to the hospital but then learned there were not many beds and long ED wait (in the midst of many respiratory viruses going around).   They have been having tele visits with primary physician but have been unable to follow up with cardiology  and dermatology as they would like too. They are hopeful for transition to rehab stay where he can work to re-gain some strength to help his wife. This will also allow [name] time to prepare the home with a goal to get him back home. [name] is motivated to rehab - we did discuss that this will take time and hard work on his part and he is ready. They feel like they are finally on a good path and plan forward after feeling lost for the past ~1 month. I explained that I am more optimistic about benefits of rehab hearing what he was able to do prior to 1 month ago. They understand that we are awaiting insurance approval.   We discussed quality of life. Sumner has fair quality of life. He enjoys television and sports. 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 He has one son who is local and 21 years old. He worked in Therapist, sports. We discussed also Advance Directives and they share that they feel they have completed these but probably need updating - I brought them 2 copies to review. Otherwise they  are hopeful to move forward with rehab with ultimate goal to return home.   All questions/concerns addressed. Emotional support provided.   Primary Decision Maker PATIENT    SUMMARY OF RECOMMENDATIONS   - Hopeful for rehab - Hopeful to eventually return home with wife - Advance Directive packet provided for review  Code Status/Advance Care Planning: Full code   Symptom Management:  Ears clogged R > L: Debrox ordered. Will see if we can get equipment to flush out if needed. Will evaluate further tomorrow.   Prognosis:  Unable to determine  Discharge Planning: Skilled Nursing Facility for rehab with Palliative care service follow-up      Primary Diagnoses: Present on Admission:  Hypokalemia  Decubitus ulcers  Essential hypertension  Hyperlipidemia  AKI (acute kidney injury) (HCC)   I have reviewed the medical record, interviewed the patient and  family, and examined the patient. The following aspects are pertinent.  Past Medical History:  Diagnosis Date   Acute systolic heart failure (HCC)    Anemia    Anxiety    Arthritis    Cancer (HCC)    basal and squamous cell carcinoma   CHF (congestive heart failure) (HCC)    Chronic back pain    COVID-19    DDD (degenerative disc disease), thoracolumbar    Degenerative cervical spinal stenosis    Depression    Diabetic autonomic neuropathy (HCC)    Dyspnea    with exertion    GERD (gastroesophageal reflux disease)    Headache    History of bronchitis    History of kidney stones    Hyperlipidemia    Hypertension    Hypogonadism in male    Myocardial infarction [facility])    Neuropathy    NSTEMI (non-ST elevated myocardial infarction) (HCC)    10/29/2018- PCI/DESx1 to pLAD, EF 35%   Orthostatic hypotension    OSA (obstructive sleep apnea)    per pt study done 2006 (approx)  used cpap few yrs then stopped using, stated didn't feel he needed it anymore   Psoriasis    Retinal tear    Right knee meniscal tear    Type 2 diabetes mellitus (HCC)    Urinary hesitancy    Vitamin D deficiency    Social History   Socioeconomic History   Marital status: Married    Spouse name: Not on file   Number of children: 1   Years of education: Not on file   Highest education level: Some college, no degree  Occupational History   Not on file  Tobacco Use   Smoking status: Never   Smokeless tobacco: Never  Vaping Use   Vaping status: Never Used  Substance and Sexual Activity   Alcohol use: Yes    Comment: rare   Drug use: No   Sexual activity: Not on file  Other Topics Concern   Not on file  Social History Narrative   Lives at home with wife, who is Charity fundraiser.  Pt is retired.  Caffeine 2-3 cups per week. Right handed.    Social Drivers of Corporate investment banker Strain: Not on file  Food Insecurity: No Food Insecurity (09/22/2023)   Hunger Vital Sign    Worried About Running Out of  Food in the Last Year: Never true    Ran Out of Food in the Last Year: Never true  Transportation Needs: Unmet Transportation Needs (09/22/2023)   PRAPARE - Administrator, Civil Service (Medical): Yes  Lack of Transportation (Non-Medical): Yes  Physical Activity: Not on file  Stress: Not on file  Social Connections: Socially Isolated (09/22/2023)   Social Connection and Isolation Panel [NHANES]    Frequency of Communication with Friends and Family: Once a week    Frequency of Social Gatherings with Friends and Family: Never    Attends Religious Services: Never    Diplomatic Services operational officer: No    Attends Engineer, structural: Never    Marital Status: Married   Family History  Problem Relation Age of Onset   Neuropathy Neg Hx    Scheduled Meds:  aspirin EC  81 mg Oral Daily   atorvastatin  80 mg Oral Daily   buPROPion  300 mg Oral Daily   carvedilol  6.25 mg Oral BID WC   cyanocobalamin  1,000 mcg Oral Daily   DULoxetine  60 mg Oral QPM   Gerhardt's butt cream   Topical BID   heparin  5,000 Units Subcutaneous Q8H   insulin aspart  0-15 Units Subcutaneous TID WC   insulin aspart  0-5 Units Subcutaneous QHS   leptospermum manuka honey  1 Application Topical Daily   lidocaine  1 patch Transdermal Q24H   Continuous Infusions: PRN Meds:.acetaminophen **OR** acetaminophen, ondansetron **OR** ondansetron (ZOFRAN) IV, oxyCODONE No Known Allergies Review of Systems  Constitutional:  Positive for activity change. Negative for appetite change and fatigue.  Neurological:  Positive for weakness.    Physical Exam Vitals and nursing note reviewed.  Constitutional:      General: He is awake. He is not in acute distress. Cardiovascular:     Rate and Rhythm: Normal rate.  Pulmonary:     Effort: No tachypnea, accessory muscle usage or respiratory distress.  Abdominal:     Palpations: Abdomen is soft.  Neurological:     Mental Status: He is alert and  oriented to person, place, and time.     Vital Signs: BP (!) 145/74   Pulse 77   Temp 97.8 F (36.6 C) (Oral)   Resp 18   Ht 6\' 1"  (1.854 m)   Wt 107.4 kg   SpO2 97%   BMI 31.24 kg/m  Pain Scale: 0-10 POSS *See Group Information*: 1-Acceptable,Awake and alert Pain Score: 4    SpO2: SpO2: 97 % O2 Device:SpO2: 97 % O2 Flow Rate: .   IO: Intake/output summary:  Intake/Output Summary (Last 24 hours) at 09/25/2023 1457 Last data filed at 09/25/2023 9629 Gross per 24 hour  Intake 1148.73 ml  Output 3200 ml  Net -2051.27 ml    LBM: Last BM Date : 09/22/23 (Pt try to move his but was unable too) Baseline Weight: Weight: 107.4 kg Most recent weight: Weight: 107.4 kg     Palliative Assessment/Data:     Time Total: 80 min  Greater than 50%  of this time was spent counseling and coordinating care related to the above assessment and plan.  Signed by: Yong Channel, NP Palliative Medicine Team Pager # [phone] (M-F 8a-5p) Team Phone # [phone] (Nights/Weekends)

## 2023-09-25 NOTE — TOC Progression Note (Signed)
Transition of Care [facility]) - Progression Note    Patient Details  Name: Devin Combs MRN: [id number] Date of Birth: Aug 10, 1951  [facility]) CM/SW Contact  Erin Sons, [place] Phone Number: 09/25/2023, 1:51 PM  Clinical Narrative:     CSW called pt's spouse for SNF choice. Spouse states they would like [facility].   CSW confirmed bed with [facility].  Auth request submitted; Berkley Harvey ID# [id number] Auth is pending  Expected Discharge Plan: Skilled Nursing Facility Barriers to Discharge: Continued Medical Work up, English as a second language teacher      Social Determinants of Health (SDOH) Interventions SDOH Screenings   Food Insecurity: No Food Insecurity (09/22/2023)  Housing: Low Risk  (09/22/2023)  Transportation Needs: Unmet Transportation Needs (09/22/2023)  Utilities: Not At Risk (09/22/2023)  Social Connections: Socially Isolated (09/22/2023)  Tobacco Use: Low Risk  (09/22/2023)    Readmission Risk Interventions     No data to display

## 2023-09-26 DIAGNOSIS — R5381 Other malaise: Secondary | ICD-10-CM

## 2023-09-26 DIAGNOSIS — Z7189 Other specified counseling: Secondary | ICD-10-CM | POA: Diagnosis not present

## 2023-09-26 DIAGNOSIS — Z515 Encounter for palliative care: Secondary | ICD-10-CM | POA: Diagnosis not present

## 2023-09-26 DIAGNOSIS — E876 Hypokalemia: Secondary | ICD-10-CM | POA: Diagnosis not present

## 2023-09-26 LAB — BASIC METABOLIC PANEL
Anion gap: 9 (ref 5–15)
BUN: 16 mg/dL (ref 8–23)
CO2: 25 mmol/L (ref 22–32)
Calcium: 9.1 mg/dL (ref 8.9–10.3)
Chloride: 102 mmol/L (ref 98–111)
Creatinine, Ser: 1.17 mg/dL (ref 0.61–1.24)
GFR, Estimated: >60 mL/min (ref >60)
Glucose, Bld: 127 mg/dL — ABNORMAL HIGH (ref 70–99)
Potassium: 4.1 mmol/L (ref 3.5–5.1)
Sodium: 136 mmol/L (ref 135–145)

## 2023-09-26 LAB — GLUCOSE, CAPILLARY
Glucose-Capillary: 117 mg/dL — ABNORMAL HIGH (ref 70–99)
Glucose-Capillary: 158 mg/dL — ABNORMAL HIGH (ref 70–99)
Glucose-Capillary: 112 mg/dL — ABNORMAL HIGH (ref 70–99)
Glucose-Capillary: 130 mg/dL — ABNORMAL HIGH (ref 70–99)

## 2023-09-26 LAB — MAGNESIUM: Magnesium: 1.6 mg/dL — ABNORMAL LOW (ref 1.7–2.4)

## 2023-09-26 MED ORDER — CARBAMIDE PEROXIDE 6.5 % OT SOLN
5.0000 [drp] | Freq: Two times a day (BID) | OTIC | Status: AC
  Administered 2023-09-26 – 2023-09-28 (×4): 5 [drp] via OTIC
  Filled 2023-09-26: qty 15

## 2023-09-26 MED ORDER — METHOCARBAMOL 500 MG PO TABS
500.0000 mg | ORAL_TABLET | Freq: Three times a day (TID) | ORAL | Status: DC | PRN
  Administered 2023-09-26 – 2023-10-05 (×6): 500 mg via ORAL
  Filled 2023-09-26 (×7): qty 1

## 2023-09-26 MED ORDER — MAGNESIUM SULFATE 2 GM/50ML IV SOLN
2.0000 g | Freq: Once | INTRAVENOUS | Status: AC
Start: 2023-09-26 — End: 2023-09-26
  Administered 2023-09-26: 2 g via INTRAVENOUS
  Filled 2023-09-26: qty 50

## 2023-09-26 NOTE — Progress Notes (Signed)
PROGRESS NOTE    Devin Combs  ZOX:[id number] DOB: Jun 28, 1951 DOA: 09/21/2023 PCP: Elias Else, MD   Brief Narrative:  This 73 year old male with prior history of autonomic dysfunction, prior history of COVID with long COVID symptoms, history of hypertension, coronary artery disease, chronic back pain with a history of lumbar fusion, bedridden for the last 2 years presented with worsening decubitus ulcers and wife requesting that patient be placed into rehab facility. On presentation, MRI of brain did not show any acute intracranial abnormity. Lumbar spine did not show acute abnormality in the lumbar spine. PT recommended SNF placement. TOC consulted.   Assessment & Plan:   Principal Problem:   Hypokalemia Active Problems:   Hypomagnesemia   Decubitus ulcers   Essential hypertension   Hyperlipidemia   DM (diabetes mellitus), type 2 (HCC)   Bedbound   AKI (acute kidney injury) (HCC)   Decubitus ulcers:  POA Decubitus noted on right and left buttocks, left posterior thigh. Continue local care.  Wound care consult.  No signs of infection.   Hypokalemia: Replaced.  Continue to monitor   Hypomagnesemia Replaced.  Continue to monitor   Acute kidney injury: > Resolved. Creatinine on admission 1.43.  Baseline creatinine normal. Encourage oral intake. Currently on gentle hydration.   DC IV fluids.  Losartan held.   Hypertension: Continue Coreg.  Keep losartan on hold.    Hyperlipidemia: Continue Lipitor.   Diabetes mellitus type 2  Continue CBGs with SSI.  Hold metformin.   Bed bound : History of autonomic dysfunction PT recommending SNF placement.  TOC consulted   Depression: Continue bupropion and duloxetine.  Outpatient follow-up with PCP   Goals of care: Overall prognosis is guarded to poor.  Palliative care consulted.  Patient is hopeful for rehab.   Obesity class I -Outpatient follow-up   Leukocytosis: -Resolved   DVT prophylaxis: Heparin sq Code  Status: Full code Family Communication: No family at bed side Disposition Plan:   Status is: Inpatient Remains inpatient appropriate because: Medically clear, awaiting SNF placement.    Consultants:  Palliative care  Procedures: None  Antimicrobials:  Anti-infectives (From admission, onward)    Start     Dose/Rate Route Frequency Ordered Stop   09/21/23 2100  cefTRIAXone (ROCEPHIN) 1 g in sodium chloride 0.9 % 100 mL IVPB        1 g 200 mL/hr over 30 Minutes Intravenous  Once 09/21/23 2056 09/21/23 2159      Subjective: Patient was seen and examined at bedside.Overnight events noted.   Patient reports doing much better. Patient has decubitus ulcers which are not infected and healing well.  Objective: Vitals:   09/25/23 1611 09/25/23 2111 09/26/23 0510 09/26/23 0819  BP: (!) 142/76 133/75 (!) 140/86 135/80  Pulse: 71 68 74 71  Resp: 20 18 16    Temp:  97.7 F (36.5 C) 97.7 F (36.5 C) 97.9 F (36.6 C)  TempSrc:  Oral Oral   SpO2: 95% 95% 98% 95%  Weight:      Height:        Intake/Output Summary (Last 24 hours) at 09/26/2023 1236 Last data filed at 09/26/2023 0500 Gross per 24 hour  Intake --  Output 1450 ml  Net -1450 ml   Filed Weights   09/22/23 0642  Weight: 107.4 kg    Examination:  General exam: Appears calm and comfortable, deconditioned, not in any acute distress. Respiratory system: CTA bilaterally. Respiratory effort normal.  RR 15 Cardiovascular system: S1 & S2 heard, RRR. No  JVD, murmurs, rubs, gallops or clicks.  Gastrointestinal system: Abdomen is non distended, soft and non tender. Normal bowel sounds heard. Central nervous system: Alert and oriented x 3. No focal neurological deficits. Extremities: Edema+, no cyanosis, no clubbing Skin: No rashes, lesions or ulcers Psychiatry: Judgement and insight appear normal. Mood & affect appropriate.     Data Reviewed: I have personally reviewed following labs and imaging studies  CBC: Recent  Labs  Lab 09/21/23 2037 09/23/23 0708  WBC 14.3* 10.1  NEUTROABS 12.2* 8.2*  HGB 13.5 12.5*  HCT 40.7 37.3*  MCV 86.6 86.7  PLT 349 274   Basic Metabolic Panel: Recent Labs  Lab 09/22/23 1000 09/23/23 0708 09/24/23 0623 09/25/23 0554 09/26/23 0622  NA 136 136 137 139 136  K 4.2 3.6 3.9 4.0 4.1  CL 101 102 100 101 102  CO2 25 25 27 24 25   GLUCOSE 184* 142* 112* 116* 127*  BUN 10 15 21 19 16   CREATININE 0.75 1.43* 1.52* 1.26* 1.17  CALCIUM 8.4* 8.4* 8.8* 9.0 9.1  MG 1.8 1.7 1.6* 1.4* 1.6*   GFR: Estimated Creatinine Clearance: 73.4 mL/min (by C-G formula based on SCr of 1.17 mg/dL). Liver Function Tests: Recent Labs  Lab 09/23/23 0708  AST 17  ALT 15  ALKPHOS 107  BILITOT 0.6  PROT 5.2*  ALBUMIN 2.2*   No results for input(s): "LIPASE", "AMYLASE" in the last 168 hours. No results for input(s): "AMMONIA" in the last 168 hours. Coagulation Profile: No results for input(s): "INR", "PROTIME" in the last 168 hours. Cardiac Enzymes: No results for input(s): "CKTOTAL", "CKMB", "CKMBINDEX", "TROPONINI" in the last 168 hours. BNP (last 3 results) No results for input(s): "PROBNP" in the last 8760 hours. HbA1C: No results for input(s): "HGBA1C" in the last 72 hours. CBG: Recent Labs  Lab 09/25/23 1129 09/25/23 1643 09/25/23 2115 09/26/23 0908 09/26/23 1211  GLUCAP 175* 130* 129* 117* 158*   Lipid Profile: No results for input(s): "CHOL", "HDL", "LDLCALC", "TRIG", "CHOLHDL", "LDLDIRECT" in the last 72 hours. Thyroid Function Tests: No results for input(s): "TSH", "T4TOTAL", "FREET4", "T3FREE", "THYROIDAB" in the last 72 hours. Anemia Panel: No results for input(s): "VITAMINB12", "FOLATE", "FERRITIN", "TIBC", "IRON", "RETICCTPCT" in the last 72 hours. Sepsis Labs: No results for input(s): "PROCALCITON", "LATICACIDVEN" in the last 168 hours.  Recent Results (from the past 240 hours)  Resp panel by RT-PCR (RSV, Flu A&B, Covid) Anterior Nasal Swab     Status:  None   Collection Time: 09/21/23  8:16 PM   Specimen: Anterior Nasal Swab  Result Value Ref Range Status   SARS Coronavirus 2 by RT PCR NEGATIVE NEGATIVE Final    Comment: (NOTE) SARS-CoV-2 target nucleic acids are NOT DETECTED.  The SARS-CoV-2 RNA is generally detectable in upper respiratory specimens during the acute phase of infection. The lowest concentration of SARS-CoV-2 viral copies this assay can detect is 138 copies/mL. A negative result does not preclude SARS-Cov-2 infection and should not be used as the sole basis for treatment or other patient management decisions. A negative result may occur with  improper specimen collection/handling, submission of specimen other than nasopharyngeal swab, presence of viral mutation(s) within the areas targeted by this assay, and inadequate number of viral copies(<138 copies/mL). A negative result must be combined with clinical observations, patient history, and epidemiological information. The expected result is Negative.  Fact Sheet for Patients:  BloggerCourse.com  Fact Sheet for Healthcare Providers:  SeriousBroker.it  This test is no t yet approved or cleared by  the [employer] and  has been authorized for detection and/or diagnosis of SARS-CoV-2 by FDA under an Emergency Use Authorization (EUA). This EUA will remain  in effect (meaning this test can be used) for the duration of the COVID-19 declaration under Section 564(b)(1) of the Act, 21 U.S.C.section 360bbb-3(b)(1), unless the authorization is terminated  or revoked sooner.       Influenza A by PCR NEGATIVE NEGATIVE Final   Influenza B by PCR NEGATIVE NEGATIVE Final    Comment: (NOTE) The Xpert Xpress SARS-CoV-2/FLU/RSV plus assay is intended as an aid in the diagnosis of influenza from Nasopharyngeal swab specimens and should not be used as a sole basis for treatment. Nasal washings and aspirates are unacceptable  for Xpert Xpress SARS-CoV-2/FLU/RSV testing.  Fact Sheet for Patients: BloggerCourse.com  Fact Sheet for Healthcare Providers: SeriousBroker.it  This test is not yet approved or cleared by the [place] FDA and has been authorized for detection and/or diagnosis of SARS-CoV-2 by FDA under an Emergency Use Authorization (EUA). This EUA will remain in effect (meaning this test can be used) for the duration of the COVID-19 declaration under Section 564(b)(1) of the Act, 21 U.S.C. section 360bbb-3(b)(1), unless the authorization is terminated or revoked.     Resp Syncytial Virus by PCR NEGATIVE NEGATIVE Final    Comment: (NOTE) Fact Sheet for Patients: BloggerCourse.com  Fact Sheet for Healthcare Providers: SeriousBroker.it  This test is not yet approved or cleared by the [place] FDA and has been authorized for detection and/or diagnosis of SARS-CoV-2 by FDA under an Emergency Use Authorization (EUA). This EUA will remain in effect (meaning this test can be used) for the duration of the COVID-19 declaration under Section 564(b)(1) of the Act, 21 U.S.C. section 360bbb-3(b)(1), unless the authorization is terminated or revoked.  Performed at [facility], [address], [place], [place] [address]    Radiology Studies: No results found.  Scheduled Meds:  aspirin EC  81 mg Oral Daily   atorvastatin  80 mg Oral Daily   buPROPion  300 mg Oral Daily   carbamide peroxide  5 drop Right EAR BID   carvedilol  6.25 mg Oral BID WC   cyanocobalamin  1,000 mcg Oral Daily   DULoxetine  60 mg Oral QPM   Gerhardt's butt cream   Topical BID   heparin  5,000 Units Subcutaneous Q8H   insulin aspart  0-15 Units Subcutaneous TID WC   insulin aspart  0-5 Units Subcutaneous QHS   leptospermum manuka honey  1 Application Topical Daily   lidocaine  1 patch Transdermal  Q24H   Continuous Infusions:   LOS: 2 days    Time spent: 50 mins    Willeen Niece, MD [facility]   If 7PM-7AM, please contact night-coverage

## 2023-09-26 NOTE — Progress Notes (Signed)
Palliative:  HPI:  72 y.o. male  with past medical history of autonomic dysfunction, COVID history with long COVID symptoms, HTN, CAD, diabetes, neuropathy, chronic back pain s/p lumbar fusion, depression, bedridden x 2 years admitted on 09/21/2023 with worsening decubitus and concern for increased care needs.   I met today with Iantha Fallen and [place]. Devin Combs is lying in bed and watching television. He is eating well and sleeping well. He does have increase lower back pain when working with therapy. He has significant history of back pain which improved after surgical intervention in 2022. He reports this is a similar pain - deep, gnawing, shooting pain. We discussed pre-medication with [facility]. We discussed what worked for him before his surgery and they report that opioids were most effective and he was previously on fentanyl patch and had some relief from lidocaine injections. We discussed PRN muscle relaxer and they do not recall if they tried this before.  I also examined ears with otoscope. L ear looks good. R ear does have wax buildup - continue debrox. I do not have flushing kit but discussed with wife who is RN where she can locate and that she can always flush ear out at any time.   They continue to be hopeful for SNF rehab to optimize his functional status with ultimate goal to return home with wife.   All questions/concerns addressed. Emotional support provided.   Exam: Alert, oriented. No distress. Breathing regular, unlabored. Abd soft. Generalized weakness.   Plan: - Continue Debrox for right ear wax - Robaxin PRN to assist with back pain to better endure therapy - he is very motivated - Hopeful for SNF rehab - Advance Directive provided for review - Recommend outpatient palliative follow up at [facility] - Please call [phone] for further acute palliative needs  35 min  Yong Channel, NP Palliative Medicine Team Pager [phone] (Please see [email or web address] for schedule) Team Phone  [phone]

## 2023-09-26 NOTE — Progress Notes (Signed)
Occupational Therapy Treatment Patient Details Name: Devin Combs MRN: [id number] DOB: [birth year] Today's Date: 09/26/2023   History of present illness 73 year old Caucasian male presents 2/7 with complaints of recent fecal and urinary incontinence. Labs showed Hypokalemia, and Hypomagnesemia. He has also developed decubitus ulcers on the right hip, left buttock and left posterior thigh last week. Prior history of autonomic dysfunction, prior history of COVID with long COVID symptoms, history of hypertension, coronary artery disease, chronic back pain with a history of lumbar fusion, bedridden for the last 2 years,   OT comments  Pt is making continued progress towards acute OT goals. Pt continues to be limited by deficits listed below. Pt required up to MAX A+2 for functional mobility tasks with verbal/tactile cues for sequencing. Once seated EOB, pt observed with R lateral lean. Pt educated on B hand/foot placement and weight shifting to maintain upright sitting posture. Pt initially required MIN A to maintain balance but progressed to MAX when fatigued. Pt tolerated sitting EOB ~10 minutes while engaging in BUE exercises without resistance. Pt completed 5-10 reps of each exercise (shoulder flexion, elbow flexion, forearm supination/pronation). Due to decreased activity tolerance, pt required rest breaks after each set. Pt encouraged to perform exercises frequently throughout the day to increase strength. Pt and wife verbalized understanding. OT to continue following pt acutely to address functional needs with the recommendation of follow-up OT services <3hrs/day to maximize functional independence.       If plan is discharge home, recommend the following:  Two people to help with walking and/or transfers;A lot of help with bathing/dressing/bathroom;Assistance with cooking/housework;Assistance with feeding;Direct supervision/assist for medications management;Direct supervision/assist for financial  management;Assist for transportation;Help with stairs or ramp for entrance   Equipment Recommendations  Other (comment) (defer)    Recommendations for Other Services      Precautions / Restrictions Precautions Precautions: Fall Restrictions Weight Bearing Restrictions Per Provider Order: No       Mobility Bed Mobility Overal bed mobility: Needs Assistance Bed Mobility: Rolling, Sidelying to Sit, Sit to Sidelying Rolling: Max assist, Used rails Sidelying to sit: Max assist, +2 for physical assistance, Used rails     Sit to sidelying: Max assist, +2 for physical assistance, Used rails General bed mobility comments: Pt required verbal cues for sequencing. Pt educated on using bed rail and pushing through BLE to assist with rolling. Pt required assistance facilitating trunk to upight position EOB and to scoot forward until B feet were flat on floor.    Transfers Overall transfer level: Needs assistance                 General transfer comment: Did not attempt on this date.     Balance Overall balance assessment: Needs assistance Sitting-balance support: Bilateral upper extremity supported, Feet supported Sitting balance-Leahy Scale: Poor Sitting balance - Comments: Pt engaged in BUE exercises while seated EOB Postural control: Right lateral lean                                 ADL either performed or assessed with clinical judgement   ADL Overall ADL's : Needs assistance/impaired Eating/Feeding: Set up;Bed level                                   Functional mobility during ADLs: Maximal assistance;+2 for physical assistance General ADL Comments: Pt requires verbal/tactile cues for  sequencing. Pt required MAX +2 due to pain, decreased BUE ROM in shoulders, weakness, and decreased activity tolerance.    Extremity/Trunk Assessment Upper Extremity Assessment Upper Extremity Assessment: Generalized weakness;RUE deficits/detail;LUE  deficits/detail RUE Deficits / Details: Difficulty maintaining shoulder flexion against gravity. Impaired grip strength. Decreased ROM in shoulder. Pain in R shoulder LUE Deficits / Details: Difficulty maintaining shoulder flexion against gravity. Impaired grip strength. Decreased ROM in shoulder   Lower Extremity Assessment Lower Extremity Assessment: Defer to PT evaluation        Vision   Vision Assessment?: No apparent visual deficits   Perception Perception Perception: Not tested   Praxis Praxis Praxis: Not tested   Communication Communication Communication: No apparent difficulties   Cognition Arousal: Alert Behavior During Therapy: WFL for tasks assessed/performed Cognition: No apparent impairments             OT - Cognition Comments: Required increased time to initate transitioning from seated EOB to sidelying. Requries verbal/tactile cues to sequence tasks                 Following commands: Impaired Following commands impaired: Follows multi-step commands inconsistently      Cueing   Cueing Techniques: Verbal cues, Tactile cues  Exercises Exercises: General Upper Extremity, Other exercises General Exercises - Upper Extremity Shoulder Flexion: AROM, Both, 5 reps Elbow Flexion: 10 reps, Both Other Exercises Other Exercises: forearm supination/pronation; BUE; 10 reps    Shoulder Instructions       General Comments VSS    Pertinent Vitals/ Pain       Pain Assessment Pain Assessment: Faces Faces Pain Scale: Hurts even more Pain Location: back, pressure ulcers Pain Descriptors / Indicators: Grimacing, Aching, Discomfort Pain Intervention(s): Monitored during session  Home Living                                          Prior Functioning/Environment              Frequency  Min 1X/week        Progress Toward Goals  OT Goals(current goals can now be found in the care plan section)  Progress towards OT goals:  Progressing toward goals  Acute Rehab OT Goals Patient Stated Goal: to get stronger OT Goal Formulation: With patient/family Time For Goal Achievement: 10/06/23 Potential to Achieve Goals: Good ADL Goals Pt Will Perform Grooming: with supervision;sitting Pt Will Perform Upper Body Dressing: with contact guard assist;sitting Pt Will Perform Lower Body Dressing: with adaptive equipment;with min assist;bed level;sitting/lateral leans Additional ADL Goal #1: pt will perform bed mobility min A in prep for ADLs Additional ADL Goal #2: Pt will complete BUE HEP at bed level with supervision  Plan      Co-evaluation          OT goals addressed during session: Strengthening/ROM;Other (comment) (bed mobility, sitting balance)      AM-PAC OT "6 Clicks" Daily Activity     Outcome Measure   Help from another person eating meals?: A Little Help from another person taking care of personal grooming?: A Little Help from another person toileting, which includes using toliet, bedpan, or urinal?: A Lot Help from another person bathing (including washing, rinsing, drying)?: A Lot Help from another person to put on and taking off regular upper body clothing?: A Lot Help from another person to put on and taking off regular lower body clothing?:  A Lot 6 Click Score: 14    End of Session    OT Visit Diagnosis: Unsteadiness on feet (R26.81);Other abnormalities of gait and mobility (R26.89);Muscle weakness (generalized) (M62.81)   Activity Tolerance Patient tolerated treatment well   Patient Left in bed;with call bell/phone within reach;with bed alarm set;with family/visitor present   Nurse Communication Mobility status        Time: 1353-1416 OT Time Calculation (min): 23 min  Charges: OT General Charges $OT Visit: 1 Visit OT Treatments $Therapeutic Activity: 8-22 mins $Therapeutic Exercise: 8-22 mins  Lynnda Shields 09/26/2023, 2:43 PM

## 2023-09-26 NOTE — Plan of Care (Signed)

## 2023-09-26 NOTE — TOC Progression Note (Signed)
Transition of Care [facility]) - Progression Note    Patient Details  Name: Devin Combs MRN: [id number] Date of Birth: 1950-12-21  Transition of Care [facility]) CM/SW Contact  Erin Sons, [place] Phone Number: 09/26/2023, 11:46 AM  Clinical Narrative:      SNF auth still pending at this time.   Expected Discharge Plan: Skilled Nursing Facility Barriers to Discharge: Continued Medical Work up, English as a second language teacher                  Social Determinants of Health (SDOH) Interventions SDOH Screenings   Food Insecurity: No Food Insecurity (09/22/2023)  Housing: Low Risk  (09/22/2023)  Transportation Needs: Unmet Transportation Needs (09/22/2023)  Utilities: Not At Risk (09/22/2023)  Social Connections: Socially Isolated (09/22/2023)  Tobacco Use: Low Risk  (09/22/2023)    Readmission Risk Interventions     No data to display

## 2023-09-27 DIAGNOSIS — E876 Hypokalemia: Secondary | ICD-10-CM | POA: Diagnosis not present

## 2023-09-27 LAB — GLUCOSE, CAPILLARY
Glucose-Capillary: 114 mg/dL — ABNORMAL HIGH (ref 70–99)
Glucose-Capillary: 145 mg/dL — ABNORMAL HIGH (ref 70–99)
Glucose-Capillary: 125 mg/dL — ABNORMAL HIGH (ref 70–99)
Glucose-Capillary: 114 mg/dL — ABNORMAL HIGH (ref 70–99)

## 2023-09-27 LAB — MAGNESIUM: Magnesium: 1.6 mg/dL — ABNORMAL LOW (ref 1.7–2.4)

## 2023-09-27 MED ORDER — BISACODYL 10 MG RE SUPP
10.0000 mg | Freq: Once | RECTAL | Status: AC
  Administered 2023-09-27: 10 mg via RECTAL
  Filled 2023-09-27: qty 1

## 2023-09-27 MED ORDER — MAGNESIUM SULFATE 2 GM/50ML IV SOLN
2.0000 g | Freq: Once | INTRAVENOUS | Status: AC
  Administered 2023-09-27: 2 g via INTRAVENOUS
  Filled 2023-09-27: qty 50

## 2023-09-27 NOTE — Plan of Care (Signed)

## 2023-09-27 NOTE — TOC Progression Note (Signed)
Transition of Care [facility]) - Progression Note    Patient Details  Name: Devin Combs MRN: [id number] Date of Birth: Jan 24, 1951  Transition of Care [facility]) CM/SW Contact  Erin Sons, [place] Phone Number: 09/27/2023, 9:22 AM  Clinical Narrative:     Peer to peer offered for SNF auth. MD would need to call [phone] select option 5. Deadline of 12pm. Attending notified.   Expected Discharge Plan: Skilled Nursing Facility Barriers to Discharge: Continued Medical Work up, English as a second language teacher  Expected Discharge Plan and Services                                               Social Determinants of Health (SDOH) Interventions SDOH Screenings   Food Insecurity: No Food Insecurity (09/22/2023)  Housing: Low Risk  (09/22/2023)  Transportation Needs: Unmet Transportation Needs (09/22/2023)  Utilities: Not At Risk (09/22/2023)  Social Connections: Socially Isolated (09/22/2023)  Tobacco Use: Low Risk  (09/22/2023)    Readmission Risk Interventions     No data to display

## 2023-09-27 NOTE — Care Management Important Message (Signed)
Important Message  Patient Details  Name: Devin Combs MRN: [id number] Date of Birth: October 01, 1950   Important Message Given:  Yes - Medicare IM     Dorena Bodo 09/27/2023, 3:22 PM

## 2023-09-27 NOTE — TOC Progression Note (Addendum)
Transition of Care [facility]) - Progression Note    Patient Details  Name: Devin Combs MRN: [id number] Date of Birth: 05-17-1951  Transition of Care [facility]) CM/SW Contact  Kerrilyn Azbill A [place], LCSW Phone Number: 09/27/2023, 11:35 AM  Clinical Narrative:     Update 1548 CSW met with pt and pt's spouse at bedside to give update on denial, she stated that she wanted to appeal the decision for denial. CSW provided fast track appeal information to pt's spouse to complete. She stated that she wanted to make appeal after speaking with the provider in the AM tomorrow.  CSW was informed via secure chat by provider that  P2P was completed. Pt was denied insurance coverage for SNF. MD with insurance stated that pt needs long term care at facility.   CSW to reach out to family and discuss disposition options and if interested in appeal process.    TOC will continue to follow.   Expected Discharge Plan: Skilled Nursing Facility Barriers to Discharge: Continued Medical Work up, English as a second language teacher  Expected Discharge Plan and Services                                               Social Determinants of Health (SDOH) Interventions SDOH Screenings   Food Insecurity: No Food Insecurity (09/22/2023)  Housing: Low Risk  (09/22/2023)  Transportation Needs: Unmet Transportation Needs (09/22/2023)  Utilities: Not At Risk (09/22/2023)  Social Connections: Socially Isolated (09/22/2023)  Tobacco Use: Low Risk  (09/22/2023)    Readmission Risk Interventions     No data to display

## 2023-09-27 NOTE — Progress Notes (Signed)
PROGRESS NOTE    Devin Combs  GNF:[id number] DOB: April 15, 1951 DOA: 09/21/2023 PCP: Elias Else, MD   Brief Narrative:  This 73 year old male with prior history of autonomic dysfunction, prior history of COVID with long COVID symptoms, history of hypertension, coronary artery disease, chronic back pain with a history of lumbar fusion, bedridden for the last 2 years presented with worsening decubitus ulcers and wife requesting that patient be placed into rehab facility. On presentation, MRI of brain did not show any acute intracranial abnormity. Lumbar spine did not show acute abnormality in the lumbar spine. PT recommended SNF placement. TOC consulted.   Assessment & Plan:   Principal Problem:   Hypokalemia Active Problems:   Hypomagnesemia   Decubitus ulcers   Essential hypertension   Hyperlipidemia   DM (diabetes mellitus), type 2 (HCC)   Bedbound   AKI (acute kidney injury) (HCC)   Decubitus ulcers:  POA Decubitus noted on right and left buttocks, left posterior thigh. Continue local care.  Wound care consult.  No signs of infection.   Hypokalemia: Replaced.  Continue to monitor   Hypomagnesemia Replaced.  Continue to monitor   Acute kidney injury: > Resolved. Creatinine on admission 1.43.  Baseline creatinine normal. Encourage oral intake. Currently on gentle hydration.   DC IV fluids.  Losartan held.   Hypertension: Continue Coreg.  Keep losartan on hold.    Hyperlipidemia: Continue Lipitor.   Diabetes mellitus type 2  Continue CBGs with SSI.  Hold metformin.   Bed bound : History of autonomic dysfunction PT recommending SNF placement.  TOC consulted Peer 2 peer completed, SNF declined.  Family deciding for appeal.   Depression: Continue bupropion and duloxetine.  Outpatient follow-up with PCP   Goals of care: Overall prognosis is guarded to poor.  Palliative care consulted.  Patient is hopeful for rehab.   Obesity class I -Outpatient follow-up    Leukocytosis: -Resolved.     DVT prophylaxis: Heparin sq Code Status: Full code Family Communication: No family at bed side Disposition Plan:   Status is: Inpatient Remains inpatient appropriate because: Medically clear, awaiting SNF placement.  Peer 2 peer completed, SNF declined.  Family deciding for appeal.  Consultants:  Palliative care  Procedures: None  Antimicrobials:  Anti-infectives (From admission, onward)    Start     Dose/Rate Route Frequency Ordered Stop   09/21/23 2100  cefTRIAXone (ROCEPHIN) 1 g in sodium chloride 0.9 % 100 mL IVPB        1 g 200 mL/hr over 30 Minutes Intravenous  Once 09/21/23 2056 09/21/23 2159      Subjective: Patient was seen and examined at bedside.Overnight events noted.   Patient reports feeling much better,  Patient has decubitus ulcers which are not infected and healing well.  Objective: Vitals:   09/26/23 1537 09/26/23 1956 09/27/23 0416 09/27/23 0811  BP: (!) 148/82 136/74 136/84 (!) 157/79  Pulse: 70 73 71 81  Resp:  18 18 16   Temp:  (!) 97.2 F (36.2 C) 97.9 F (36.6 C) 98 F (36.7 C)  TempSrc:  Oral Oral Oral  SpO2: 94% 96% 95% 95%  Weight:      Height:        Intake/Output Summary (Last 24 hours) at 09/27/2023 1234 Last data filed at 09/27/2023 1229 Gross per 24 hour  Intake 240 ml  Output 2650 ml  Net -2410 ml   Filed Weights   09/22/23 0642  Weight: 107.4 kg    Examination:  General exam: Appears calm  and comfortable, deconditioned, not in any acute distress. Respiratory system: CTA bilaterally. Respiratory effort normal.  RR 16 Cardiovascular system: S1 & S2 heard, RRR. No JVD, murmurs, rubs, gallops or clicks.  Gastrointestinal system: Abdomen is non distended, soft and non tender. Normal bowel sounds heard. Central nervous system: Alert and oriented x 3. No focal neurological deficits. Extremities: Edema+, no cyanosis, no clubbing Skin: No rashes, lesions or ulcers Psychiatry: Judgement and  insight appear normal. Mood & affect appropriate.     Data Reviewed: I have personally reviewed following labs and imaging studies  CBC: Recent Labs  Lab 09/21/23 2037 09/23/23 0708  WBC 14.3* 10.1  NEUTROABS 12.2* 8.2*  HGB 13.5 12.5*  HCT 40.7 37.3*  MCV 86.6 86.7  PLT 349 274   Basic Metabolic Panel: Recent Labs  Lab 09/22/23 1000 09/23/23 0708 09/24/23 0623 09/25/23 0554 09/26/23 0622 09/27/23 0623  NA 136 136 137 139 136  --   K 4.2 3.6 3.9 4.0 4.1  --   CL 101 102 100 101 102  --   CO2 25 25 27 24 25   --   GLUCOSE 184* 142* 112* 116* 127*  --   BUN 10 15 21 19 16   --   CREATININE 0.75 1.43* 1.52* 1.26* 1.17  --   CALCIUM 8.4* 8.4* 8.8* 9.0 9.1  --   MG 1.8 1.7 1.6* 1.4* 1.6* 1.6*   GFR: Estimated Creatinine Clearance: 73.4 mL/min (by C-G formula based on SCr of 1.17 mg/dL). Liver Function Tests: Recent Labs  Lab 09/23/23 0708  AST 17  ALT 15  ALKPHOS 107  BILITOT 0.6  PROT 5.2*  ALBUMIN 2.2*   No results for input(s): "LIPASE", "AMYLASE" in the last 168 hours. No results for input(s): "AMMONIA" in the last 168 hours. Coagulation Profile: No results for input(s): "INR", "PROTIME" in the last 168 hours. Cardiac Enzymes: No results for input(s): "CKTOTAL", "CKMB", "CKMBINDEX", "TROPONINI" in the last 168 hours. BNP (last 3 results) No results for input(s): "PROBNP" in the last 8760 hours. HbA1C: No results for input(s): "HGBA1C" in the last 72 hours. CBG: Recent Labs  Lab 09/26/23 1211 09/26/23 1540 09/26/23 1958 09/27/23 0809 09/27/23 1138  GLUCAP 158* 112* 130* 114* 145*   Lipid Profile: No results for input(s): "CHOL", "HDL", "LDLCALC", "TRIG", "CHOLHDL", "LDLDIRECT" in the last 72 hours. Thyroid Function Tests: No results for input(s): "TSH", "T4TOTAL", "FREET4", "T3FREE", "THYROIDAB" in the last 72 hours. Anemia Panel: No results for input(s): "VITAMINB12", "FOLATE", "FERRITIN", "TIBC", "IRON", "RETICCTPCT" in the last 72  hours. Sepsis Labs: No results for input(s): "PROCALCITON", "LATICACIDVEN" in the last 168 hours.  Recent Results (from the past 240 hours)  Resp panel by RT-PCR (RSV, Flu A&B, Covid) Anterior Nasal Swab     Status: None   Collection Time: 09/21/23  8:16 PM   Specimen: Anterior Nasal Swab  Result Value Ref Range Status   SARS Coronavirus 2 by RT PCR NEGATIVE NEGATIVE Final    Comment: (NOTE) SARS-CoV-2 target nucleic acids are NOT DETECTED.  The SARS-CoV-2 RNA is generally detectable in upper respiratory specimens during the acute phase of infection. The lowest concentration of SARS-CoV-2 viral copies this assay can detect is 138 copies/mL. A negative result does not preclude SARS-Cov-2 infection and should not be used as the sole basis for treatment or other patient management decisions. A negative result may occur with  improper specimen collection/handling, submission of specimen other than nasopharyngeal swab, presence of viral mutation(s) within the areas targeted by this  assay, and inadequate number of viral copies(<138 copies/mL). A negative result must be combined with clinical observations, patient history, and epidemiological information. The expected result is Negative.  Fact Sheet for Patients:  BloggerCourse.com  Fact Sheet for Healthcare Providers:  SeriousBroker.it  This test is no t yet approved or cleared by the [place] FDA and  has been authorized for detection and/or diagnosis of SARS-CoV-2 by FDA under an Emergency Use Authorization (EUA). This EUA will remain  in effect (meaning this test can be used) for the duration of the COVID-19 declaration under Section 564(b)(1) of the Act, 21 U.S.C.section 360bbb-3(b)(1), unless the authorization is terminated  or revoked sooner.       Influenza A by PCR NEGATIVE NEGATIVE Final   Influenza B by PCR NEGATIVE NEGATIVE Final    Comment: (NOTE) The Xpert  Xpress SARS-CoV-2/FLU/RSV plus assay is intended as an aid in the diagnosis of influenza from Nasopharyngeal swab specimens and should not be used as a sole basis for treatment. Nasal washings and aspirates are unacceptable for Xpert Xpress SARS-CoV-2/FLU/RSV testing.  Fact Sheet for Patients: BloggerCourse.com  Fact Sheet for Healthcare Providers: SeriousBroker.it  This test is not yet approved or cleared by the [place] FDA and has been authorized for detection and/or diagnosis of SARS-CoV-2 by FDA under an Emergency Use Authorization (EUA). This EUA will remain in effect (meaning this test can be used) for the duration of the COVID-19 declaration under Section 564(b)(1) of the Act, 21 U.S.C. section 360bbb-3(b)(1), unless the authorization is terminated or revoked.     Resp Syncytial Virus by PCR NEGATIVE NEGATIVE Final    Comment: (NOTE) Fact Sheet for Patients: BloggerCourse.com  Fact Sheet for Healthcare Providers: SeriousBroker.it  This test is not yet approved or cleared by the [place] FDA and has been authorized for detection and/or diagnosis of SARS-CoV-2 by FDA under an Emergency Use Authorization (EUA). This EUA will remain in effect (meaning this test can be used) for the duration of the COVID-19 declaration under Section 564(b)(1) of the Act, 21 U.S.C. section 360bbb-3(b)(1), unless the authorization is terminated or revoked.  Performed at [facility], [address], [place], [place] [address]    Radiology Studies: No results found.  Scheduled Meds:  aspirin EC  81 mg Oral Daily   atorvastatin  80 mg Oral Daily   buPROPion  300 mg Oral Daily   carbamide peroxide  5 drop Right EAR BID   carvedilol  6.25 mg Oral BID WC   cyanocobalamin  1,000 mcg Oral Daily   DULoxetine  60 mg Oral QPM   Gerhardt's butt cream   Topical BID    heparin  5,000 Units Subcutaneous Q8H   insulin aspart  0-15 Units Subcutaneous TID WC   insulin aspart  0-5 Units Subcutaneous QHS   leptospermum manuka honey  1 Application Topical Daily   lidocaine  1 patch Transdermal Q24H   Continuous Infusions:   LOS: 3 days    Time spent: 35 mins    Willeen Niece, MD [facility]   If 7PM-7AM, please contact night-coverage

## 2023-09-28 DIAGNOSIS — E876 Hypokalemia: Secondary | ICD-10-CM | POA: Diagnosis not present

## 2023-09-28 LAB — GLUCOSE, CAPILLARY
Glucose-Capillary: 121 mg/dL — ABNORMAL HIGH (ref 70–99)
Glucose-Capillary: 150 mg/dL — ABNORMAL HIGH (ref 70–99)
Glucose-Capillary: 180 mg/dL — ABNORMAL HIGH (ref 70–99)
Glucose-Capillary: 161 mg/dL — ABNORMAL HIGH (ref 70–99)

## 2023-09-28 LAB — MAGNESIUM: Magnesium: 1.5 mg/dL — ABNORMAL LOW (ref 1.7–2.4)

## 2023-09-28 MED ORDER — MAGNESIUM SULFATE 2 GM/50ML IV SOLN
2.0000 g | Freq: Once | INTRAVENOUS | Status: AC
  Administered 2023-09-28: 2 g via INTRAVENOUS
  Filled 2023-09-28: qty 50

## 2023-09-28 NOTE — Progress Notes (Signed)
PROGRESS NOTE    Devin Combs  ZOX:[id number] DOB: [birth year] DOA: 09/21/2023 PCP: Elias Else, MD   Brief Narrative:  This 73 year old male with prior history of autonomic dysfunction, prior history of COVID with long COVID symptoms, history of hypertension, coronary artery disease, chronic back pain with a history of lumbar fusion, bedridden for the last 2 years presented with worsening decubitus ulcers and wife requesting that patient be placed into rehab facility. On presentation, MRI of brain did not show any acute intracranial abnormity. Lumbar spine did not show acute abnormality in the lumbar spine. PT recommended SNF placement. TOC consulted.   Assessment & Plan:   Principal Problem:   Hypokalemia Active Problems:   Hypomagnesemia   Decubitus ulcers   Essential hypertension   Hyperlipidemia   DM (diabetes mellitus), type 2 (HCC)   Bedbound   AKI (acute kidney injury) (HCC)   Decubitus ulcers:  POA Decubitus noted on right and left buttocks, left posterior thigh. Continue local care.  Wound care consulted.  No signs of infection.   Hypokalemia: Replaced.  Continue to monitor   Hypomagnesemia Replaced.  Continue to monitor   Acute kidney injury: > Resolved. Creatinine on admission 1.43. Baseline creatinine normal. Encourage oral intake. Currently on gentle hydration.   DC IV fluids.  Resume losartan.   Hypertension: Continue Coreg.  Resume losartan.  Hyperlipidemia: Continue Lipitor.   Diabetes mellitus type 2  Continue CBGs with SSI.  Hold metformin.   Bed bound : History of autonomic dysfunction. PT recommending SNF placement.  TOC consulted Peer 2 peer completed, SNF declined.  Family deciding for appeal.   Depression: Continue bupropion and duloxetine.  Outpatient follow-up with PCP   Goals of care: Overall prognosis is guarded to poor.  Palliative care consulted.  Patient is hopeful for rehab.   Obesity class I -Outpatient follow-up    Leukocytosis: -Resolved.     DVT prophylaxis: Heparin sq Code Status: Full code Family Communication: Wife at bed side Disposition Plan:   Status is: Inpatient Remains inpatient appropriate because: Medically clear, awaiting SNF placement.  Peer 2 peer completed, SNF declined.  Family deciding for appeal.  Consultants:  Palliative care  Procedures: None  Antimicrobials:  Anti-infectives (From admission, onward)    Start     Dose/Rate Route Frequency Ordered Stop   09/21/23 2100  cefTRIAXone (ROCEPHIN) 1 g in sodium chloride 0.9 % 100 mL IVPB        1 g 200 mL/hr over 30 Minutes Intravenous  Once 09/21/23 2056 09/21/23 2159      Subjective: Patient was seen and examined at bedside.Overnight events noted.   Patient reports feeling better, wife is hopeful for rehab.  She appealed against decision. Patient has decubitus ulcers which are not infected and healing well.  Objective: Vitals:   09/27/23 1656 09/27/23 2023 09/28/23 0402 09/28/23 0747  BP: (!) 149/77 (!) 148/80 (!) 147/79 (!) 158/82  Pulse: 71 68 71 71  Resp: 17 18 18 20   Temp: 98 F (36.7 C) 98 F (36.7 C) (!) 97.5 F (36.4 C) 98.2 F (36.8 C)  TempSrc: Oral Oral Oral Oral  SpO2: 98% 98% 95% 96%  Weight:      Height:        Intake/Output Summary (Last 24 hours) at 09/28/2023 1232 Last data filed at 09/28/2023 8119 Gross per 24 hour  Intake 120 ml  Output 3650 ml  Net -3530 ml   Filed Weights   09/22/23 0642  Weight: 107.4 kg  Examination:  General exam: Appears comfortable, deconditioned, not in any acute distress. Respiratory system: CTA bilaterally. Respiratory effort normal.  RR 15 Cardiovascular system: S1 & S2 heard, RRR. No JVD, murmurs, rubs, gallops or clicks.  Gastrointestinal system: Abdomen is non distended, soft and non tender. Normal bowel sounds heard. Central nervous system: Alert and oriented x 3. No focal neurological deficits. Extremities: Edema+, no cyanosis, no  clubbing Skin: No rashes, lesions or ulcers Psychiatry: Judgement and insight appear normal. Mood & affect appropriate.     Data Reviewed: I have personally reviewed following labs and imaging studies  CBC: Recent Labs  Lab 09/21/23 2037 09/23/23 0708  WBC 14.3* 10.1  NEUTROABS 12.2* 8.2*  HGB 13.5 12.5*  HCT 40.7 37.3*  MCV 86.6 86.7  PLT 349 274   Basic Metabolic Panel: Recent Labs  Lab 09/22/23 1000 09/23/23 0708 09/24/23 0623 09/25/23 0554 09/26/23 0622 09/27/23 0623 09/28/23 0753  NA 136 136 137 139 136  --   --   K 4.2 3.6 3.9 4.0 4.1  --   --   CL 101 102 100 101 102  --   --   CO2 25 25 27 24 25   --   --   GLUCOSE 184* 142* 112* 116* 127*  --   --   BUN 10 15 21 19 16   --   --   CREATININE 0.75 1.43* 1.52* 1.26* 1.17  --   --   CALCIUM 8.4* 8.4* 8.8* 9.0 9.1  --   --   MG 1.8 1.7 1.6* 1.4* 1.6* 1.6* 1.5*   GFR: Estimated Creatinine Clearance: 73.4 mL/min (by C-G formula based on SCr of 1.17 mg/dL). Liver Function Tests: Recent Labs  Lab 09/23/23 0708  AST 17  ALT 15  ALKPHOS 107  BILITOT 0.6  PROT 5.2*  ALBUMIN 2.2*   No results for input(s): "LIPASE", "AMYLASE" in the last 168 hours. No results for input(s): "AMMONIA" in the last 168 hours. Coagulation Profile: No results for input(s): "INR", "PROTIME" in the last 168 hours. Cardiac Enzymes: No results for input(s): "CKTOTAL", "CKMB", "CKMBINDEX", "TROPONINI" in the last 168 hours. BNP (last 3 results) No results for input(s): "PROBNP" in the last 8760 hours. HbA1C: No results for input(s): "HGBA1C" in the last 72 hours. CBG: Recent Labs  Lab 09/27/23 1138 09/27/23 1722 09/27/23 2025 09/28/23 0749 09/28/23 1133  GLUCAP 145* 125* 114* 121* 150*   Lipid Profile: No results for input(s): "CHOL", "HDL", "LDLCALC", "TRIG", "CHOLHDL", "LDLDIRECT" in the last 72 hours. Thyroid Function Tests: No results for input(s): "TSH", "T4TOTAL", "FREET4", "T3FREE", "THYROIDAB" in the last 72  hours. Anemia Panel: No results for input(s): "VITAMINB12", "FOLATE", "FERRITIN", "TIBC", "IRON", "RETICCTPCT" in the last 72 hours. Sepsis Labs: No results for input(s): "PROCALCITON", "LATICACIDVEN" in the last 168 hours.  Recent Results (from the past 240 hours)  Resp panel by RT-PCR (RSV, Flu A&B, Covid) Anterior Nasal Swab     Status: None   Collection Time: 09/21/23  8:16 PM   Specimen: Anterior Nasal Swab  Result Value Ref Range Status   SARS Coronavirus 2 by RT PCR NEGATIVE NEGATIVE Final    Comment: (NOTE) SARS-CoV-2 target nucleic acids are NOT DETECTED.  The SARS-CoV-2 RNA is generally detectable in upper respiratory specimens during the acute phase of infection. The lowest concentration of SARS-CoV-2 viral copies this assay can detect is 138 copies/mL. A negative result does not preclude SARS-Cov-2 infection and should not be used as the sole basis for treatment or  other patient management decisions. A negative result may occur with  improper specimen collection/handling, submission of specimen other than nasopharyngeal swab, presence of viral mutation(s) within the areas targeted by this assay, and inadequate number of viral copies(<138 copies/mL). A negative result must be combined with clinical observations, patient history, and epidemiological information. The expected result is Negative.  Fact Sheet for Patients:  BloggerCourse.com  Fact Sheet for Healthcare Providers:  SeriousBroker.it  This test is no t yet approved or cleared by the [place] FDA and  has been authorized for detection and/or diagnosis of SARS-CoV-2 by FDA under an Emergency Use Authorization (EUA). This EUA will remain  in effect (meaning this test can be used) for the duration of the COVID-19 declaration under Section 564(b)(1) of the Act, 21 U.S.C.section 360bbb-3(b)(1), unless the authorization is terminated  or revoked sooner.        Influenza A by PCR NEGATIVE NEGATIVE Final   Influenza B by PCR NEGATIVE NEGATIVE Final    Comment: (NOTE) The Xpert Xpress SARS-CoV-2/FLU/RSV plus assay is intended as an aid in the diagnosis of influenza from Nasopharyngeal swab specimens and should not be used as a sole basis for treatment. Nasal washings and aspirates are unacceptable for Xpert Xpress SARS-CoV-2/FLU/RSV testing.  Fact Sheet for Patients: BloggerCourse.com  Fact Sheet for Healthcare Providers: SeriousBroker.it  This test is not yet approved or cleared by the [place] FDA and has been authorized for detection and/or diagnosis of SARS-CoV-2 by FDA under an Emergency Use Authorization (EUA). This EUA will remain in effect (meaning this test can be used) for the duration of the COVID-19 declaration under Section 564(b)(1) of the Act, 21 U.S.C. section 360bbb-3(b)(1), unless the authorization is terminated or revoked.     Resp Syncytial Virus by PCR NEGATIVE NEGATIVE Final    Comment: (NOTE) Fact Sheet for Patients: BloggerCourse.com  Fact Sheet for Healthcare Providers: SeriousBroker.it  This test is not yet approved or cleared by the [place] FDA and has been authorized for detection and/or diagnosis of SARS-CoV-2 by FDA under an Emergency Use Authorization (EUA). This EUA will remain in effect (meaning this test can be used) for the duration of the COVID-19 declaration under Section 564(b)(1) of the Act, 21 U.S.C. section 360bbb-3(b)(1), unless the authorization is terminated or revoked.  Performed at [facility], [address] [place], [place] [address]    Radiology Studies: No results found.  Scheduled Meds:  aspirin EC  81 mg Oral Daily   atorvastatin  80 mg Oral Daily   buPROPion  300 mg Oral Daily   carvedilol  6.25 mg Oral BID WC   cyanocobalamin  1,000 mcg  Oral Daily   DULoxetine  60 mg Oral QPM   Gerhardt's butt cream   Topical BID   heparin  5,000 Units Subcutaneous Q8H   insulin aspart  0-15 Units Subcutaneous TID WC   insulin aspart  0-5 Units Subcutaneous QHS   leptospermum manuka honey  1 Application Topical Daily   lidocaine  1 patch Transdermal Q24H   Continuous Infusions:   LOS: 4 days    Time spent: 35 mins    Willeen Niece, MD [facility]   If 7PM-7AM, please contact night-coverage

## 2023-09-28 NOTE — Plan of Care (Signed)

## 2023-09-28 NOTE — Progress Notes (Signed)
Physical Therapy Treatment Patient Details Name: Devin Combs MRN: [id number] DOB: [birth year] Today's Date: 09/28/2023   History of Present Illness 73 year old Caucasian male presents 2/7 with complaints of recent fecal and urinary incontinence. Labs showed Hypokalemia, and Hypomagnesemia. He has also developed decubitus ulcers on the right hip, left buttock and left posterior thigh last week. Prior history of autonomic dysfunction, prior history of COVID with long COVID symptoms, history of hypertension, coronary artery disease, chronic back pain with a history of lumbar fusion, bedridden for the last 2 years,    PT Comments  Tolerated treatment well. Goals updated based on progress made in acute setting. Performed bed mobility (rolling at mod assist level), sidelying to sit with max assist, and sit to sidelying with mod assist. Able to assist with modified bridge and rails to pull self up in bed and reposition. Emphasis on seated balance today holding Stedy rail for support, weight shifting Lt and Rt, scooting forward and backwards with assistance. Progressed with transfer training to stand with stedy  - able to clear buttocks with thighs still lightly on EOB 3/5 attempts. Reviewed LE exercises and encouraged frequent performance, daily. Patient will continue to benefit from skilled physical therapy services to further improve independence with functional mobility. Plan to try [facility] + for transfer training next visit. Patient will benefit from continued inpatient follow up therapy, <3 hours/day     If plan is discharge home, recommend the following: A lot of help with bathing/dressing/bathroom;Assistance with cooking/housework;Direct supervision/assist for medications management;Direct supervision/assist for financial management;Assist for transportation;Help with stairs or ramp for entrance;Two people to help with walking and/or transfers   Can travel by private vehicle     No  Equipment  Recommendations  None recommended by PT    Recommendations for Other Services       Precautions / Restrictions Precautions Precautions: Fall Recall of Precautions/Restrictions: Impaired Precaution/Restrictions Comments: bed sores Restrictions Weight Bearing Restrictions Per Provider Order: No     Mobility  Bed Mobility Overal bed mobility: Needs Assistance Bed Mobility: Rolling, Sidelying to Sit, Sit to Sidelying Rolling: Used rails, Mod assist Sidelying to sit: Max assist, Used rails, HOB elevated     Sit to sidelying: Used rails, Mod assist General bed mobility comments: Mod assist to roll Lt and Rt in bed multiple times today emphasis on rail use to reduce caregiver burden. Still limited lifting LEs adequately. Max assist, approaching mod to rise to EOB pushing through RUE, pulling through LUE, LEs dependent position for leverage - trunk supported by therapist. Mod assist, able to lower onto Rt elbow and shoulder, but assisted with LEs into bed.    Transfers Overall transfer level: Needs assistance Equipment used: Ambulation equipment used Transfers: Sit to/from Stand Sit to Stand: Total assist, From elevated surface, Via lift equipment           General transfer comment: Tolerated 5 attempts to stand with Stedy. Extensive education on technique/leverage with Stedy. Progressed ability to pull self forward and cleared buttocks from bed x3 with thighs still lightly touching EOB. Reviewed lateral scoot along bed but toof atigued, posterior LOB min assist to correct intermittently. Transfer via Lift Equipment: Stedy  Ambulation/Gait               General Gait Details: n/a   Stairs             Wheelchair Mobility     Tilt Bed    Modified Rankin (Stroke Patients Only)  Balance Overall balance assessment: Needs assistance Sitting-balance support: Bilateral upper extremity supported, Feet supported Sitting balance-Leahy Scale: Poor Sitting  balance - Comments: Intermittent assist for LOB EOB. With hands on stedy able to sit upright majority of session. Postural control: Right lateral lean, Posterior lean                                  Communication Communication Communication: Impaired Factors Affecting Communication: Hearing impaired  Cognition Arousal: Alert Behavior During Therapy: WFL for tasks assessed/performed   PT - Cognitive impairments: Initiation, Sequencing, Problem solving, Safety/Judgement                         Following commands: Impaired Following commands impaired: Follows multi-step commands inconsistently, Follows multi-step commands with increased time, Follows one step commands with increased time    Cueing Cueing Techniques: Verbal cues, Tactile cues  Exercises General Exercises - Lower Extremity Ankle Circles/Pumps: AROM, Both, 15 reps, Supine Quad Sets: Strengthening, Both, 10 reps, Supine Gluteal Sets: Strengthening, Both, 10 reps, Supine Short Arc Quad: AAROM, Strengthening, Both, 5 reps, Supine Other Exercises Other Exercises: Seated trunk flexion lower back stretches Other Exercises: Seated balance, weight shifting to Lt and Rt elbows return to midline. Other Exercises: Passive knee flexion closed chain stretch EOB    General Comments        Pertinent Vitals/Pain Pain Assessment Pain Assessment: Faces Faces Pain Scale: Hurts even more Pain Location: back, pressure ulcers Pain Descriptors / Indicators: Grimacing, Aching, Discomfort Pain Intervention(s): Monitored during session, Repositioned, Limited activity within patient's tolerance    Home Living                          Prior Function            PT Goals (current goals can now be found in the care plan section) Acute Rehab PT Goals Patient Stated Goal: Be able to get into wheelchair, eventually walk, or golf. PT Goal Formulation: With patient/family Time For Goal Achievement:  10/06/23 Potential to Achieve Goals: Fair Progress towards PT goals: Progressing toward goals    Frequency    Min 1X/week      PT Plan      Co-evaluation              AM-PAC PT "6 Clicks" Mobility   Outcome Measure  Help needed turning from your back to your side while in a flat bed without using bedrails?: A Lot Help needed moving from lying on your back to sitting on the side of a flat bed without using bedrails?: A Lot Help needed moving to and from a bed to a chair (including a wheelchair)?: Total Help needed standing up from a chair using your arms (e.g., wheelchair or bedside chair)?: Total Help needed to walk in hospital room?: Total Help needed climbing 3-5 steps with a railing? : Total 6 Click Score: 8    End of Session   Activity Tolerance: Patient tolerated treatment well (Weakness) Patient left: in bed;with call bell/phone within reach;with bed alarm set;with family/visitor present   PT Visit Diagnosis: Muscle weakness (generalized) (M62.81);Difficulty in walking, not elsewhere classified (R26.2);Other symptoms and signs involving the nervous system (R29.898);Pain Pain - part of body:  (back, wounds)     Time: 5284-1324 PT Time Calculation (min) (ACUTE ONLY): 38 min  Charges:    $Therapeutic Exercise:  8-22 mins $Therapeutic Activity: 8-22 mins $Neuromuscular Re-education: 8-22 mins PT General Charges $$ ACUTE PT VISIT: 1 Visit                     Kathlyn Sacramento, PT, DPT [facility]  Rehabilitation Services Physical Therapist Office: [phone] Website: [email or web address]    Berton Mount 09/28/2023, 5:19 PM

## 2023-09-29 DIAGNOSIS — E876 Hypokalemia: Secondary | ICD-10-CM | POA: Diagnosis not present

## 2023-09-29 LAB — BASIC METABOLIC PANEL
Anion gap: 11 (ref 5–15)
BUN: 15 mg/dL (ref 8–23)
CO2: 24 mmol/L (ref 22–32)
Calcium: 9.3 mg/dL (ref 8.9–10.3)
Chloride: 102 mmol/L (ref 98–111)
Creatinine, Ser: 0.8 mg/dL (ref 0.61–1.24)
GFR, Estimated: >60 mL/min (ref >60)
Glucose, Bld: 100 mg/dL — ABNORMAL HIGH (ref 70–99)
Potassium: 3.9 mmol/L (ref 3.5–5.1)
Sodium: 137 mmol/L (ref 135–145)

## 2023-09-29 LAB — CBC
HCT: 38.7 % — ABNORMAL LOW (ref 39.0–52.0)
Hemoglobin: 12.8 g/dL — ABNORMAL LOW (ref 13.0–17.0)
MCH: 29.3 pg (ref 26.0–34.0)
MCHC: 33.1 g/dL (ref 30.0–36.0)
MCV: 88.6 fL (ref 80.0–100.0)
Platelets: 294 10*3/uL (ref 150–400)
RBC: 4.37 MIL/uL (ref 4.22–5.81)
RDW: 13.5 % (ref 11.5–15.5)
WBC: 9.9 10*3/uL (ref 4.0–10.5)
nRBC: 0 % (ref 0.0–0.2)

## 2023-09-29 LAB — GLUCOSE, CAPILLARY
Glucose-Capillary: 127 mg/dL — ABNORMAL HIGH (ref 70–99)
Glucose-Capillary: 167 mg/dL — ABNORMAL HIGH (ref 70–99)
Glucose-Capillary: 200 mg/dL — ABNORMAL HIGH (ref 70–99)
Glucose-Capillary: 136 mg/dL — ABNORMAL HIGH (ref 70–99)

## 2023-09-29 LAB — PHOSPHORUS: Phosphorus: 3.3 mg/dL (ref 2.5–4.6)

## 2023-09-29 LAB — MAGNESIUM: Magnesium: 1.5 mg/dL — ABNORMAL LOW (ref 1.7–2.4)

## 2023-09-29 NOTE — Progress Notes (Signed)
Patient called primary nurse said "My  lower belly hurts really bad".  Wife said every time  he retained he had a same problem so Clinical research associate did a bladder scan and it showed 760 ml. MD aware.

## 2023-09-29 NOTE — Plan of Care (Signed)

## 2023-09-29 NOTE — Progress Notes (Signed)
In-and- out cath done using sterile  technique with buddy Satara, RN, and obtained 800 ml of cloudy urine. Patient tolerated procedure well.

## 2023-09-29 NOTE — Progress Notes (Signed)
PROGRESS NOTE    Devin Combs  ION:[id number] DOB: 1950-09-04 DOA: 09/21/2023 PCP: Elias Else, MD   Brief Narrative:  This 73 year old male with prior history of autonomic dysfunction, prior history of COVID with long COVID symptoms, history of hypertension, coronary artery disease, chronic back pain with a history of lumbar fusion, bedridden for the last 2 years presented with worsening decubitus ulcers and wife requesting that patient be placed into rehab facility. On presentation, MRI of brain did not show any acute intracranial abnormity. Lumbar spine did not show acute abnormality in the lumbar spine. PT recommended SNF placement. TOC consulted.   Assessment & Plan:   Principal Problem:   Hypokalemia Active Problems:   Hypomagnesemia   Decubitus ulcers   Essential hypertension   Hyperlipidemia   DM (diabetes mellitus), type 2 (HCC)   Bedbound   AKI (acute kidney injury) (HCC)   Decubitus ulcers:  POA Decubitus noted on right and left buttocks, left posterior thigh. Continue local care.  Wound care consulted.  No signs of infection.   Hypokalemia: Replaced.  Continue to monitor   Hypomagnesemia Replaced.  Continue to monitor   Acute kidney injury: > Resolved. Creatinine on admission 1.43. Baseline creatinine normal. Encourage oral intake. Currently on gentle hydration.   DC IV fluids.  Resumed losartan.   Hypertension: Continue Coreg and losartan.  Hyperlipidemia: Continue Lipitor.   Diabetes mellitus type 2  Continue CBGs with SSI.  Hold metformin.   Bed bound : History of autonomic dysfunction. PT recommending SNF placement.  TOC consulted Peer 2 peer completed, SNF declined.  Family deciding for appeal.   Depression: Continue bupropion and duloxetine.  Outpatient follow-up with PCP   Goals of care: Overall prognosis is guarded to poor.  Palliative care consulted.  Patient is hopeful for rehab.   Obesity class I -Outpatient follow-up    Leukocytosis: -Resolved.     DVT prophylaxis: Heparin sq Code Status: Full code Family Communication: Wife at bed side Disposition Plan:   Status is: Inpatient Remains inpatient appropriate because: Medically clear, awaiting SNF placement.  Peer 2 peer completed, SNF declined.  Family appealed against denial.  Consultants:  Palliative care  Procedures: None  Antimicrobials:  Anti-infectives (From admission, onward)    Start     Dose/Rate Route Frequency Ordered Stop   09/21/23 2100  cefTRIAXone (ROCEPHIN) 1 g in sodium chloride 0.9 % 100 mL IVPB        1 g 200 mL/hr over 30 Minutes Intravenous  Once 09/21/23 2056 09/21/23 2159      Subjective: Patient was seen and examined at bedside. Overnight events noted.   Patient reports feeling better,  wife is at bedside , she is hopeful of the rehab.  Appeal is in process. Patient has decubitus ulcers which are not infected and healing well.  Objective: Vitals:   09/28/23 1500 09/28/23 1950 09/29/23 0454 09/29/23 0746  BP: 137/85 (!) 141/67 (!) 156/76 (!) 152/98  Pulse: 88 74 73 86  Resp: 18 18 18 18   Temp: 98.7 F (37.1 C) (!) 97.2 F (36.2 C) (!) 96.4 F (35.8 C) 98.5 F (36.9 C)  TempSrc: Oral   Oral  SpO2: 98% 96% 97% 99%  Weight:      Height:        Intake/Output Summary (Last 24 hours) at 09/29/2023 1257 Last data filed at 09/29/2023 2440 Gross per 24 hour  Intake 120 ml  Output 2200 ml  Net -2080 ml   Filed Weights   09/22/23  8295  Weight: 107.4 kg    Examination:  General exam: Appears comfortable, deconditioned, not in any acute distress. Respiratory system: CTA bilaterally. Respiratory effort normal.  RR 16 Cardiovascular system: S1 & S2 heard, RRR. No JVD, murmurs, rubs, gallops or clicks.  Gastrointestinal system: Abdomen is non distended, soft and non tender. Normal bowel sounds heard. Central nervous system: Alert and oriented x 3. No focal neurological deficits. Extremities: Edema+, no  cyanosis, no clubbing Skin: Decubitus + No rashes, lesions or ulcers Psychiatry: Judgement and insight appear normal. Mood & affect appropriate.     Data Reviewed: I have personally reviewed following labs and imaging studies  CBC: Recent Labs  Lab 09/23/23 0708 09/29/23 0656  WBC 10.1 9.9  NEUTROABS 8.2*  --   HGB 12.5* 12.8*  HCT 37.3* 38.7*  MCV 86.7 88.6  PLT 274 294   Basic Metabolic Panel: Recent Labs  Lab 09/23/23 0708 09/24/23 0623 09/25/23 0554 09/26/23 0622 09/27/23 0623 09/28/23 0753 09/29/23 0656  NA 136 137 139 136  --   --  137  K 3.6 3.9 4.0 4.1  --   --  3.9  CL 102 100 101 102  --   --  102  CO2 25 27 24 25   --   --  24  GLUCOSE 142* 112* 116* 127*  --   --  100*  BUN 15 21 19 16   --   --  15  CREATININE 1.43* 1.52* 1.26* 1.17  --   --  0.80  CALCIUM 8.4* 8.8* 9.0 9.1  --   --  9.3  MG 1.7 1.6* 1.4* 1.6* 1.6* 1.5* 1.5*  PHOS  --   --   --   --   --   --  3.3   GFR: Estimated Creatinine Clearance: 107.3 mL/min (by C-G formula based on SCr of 0.8 mg/dL). Liver Function Tests: Recent Labs  Lab 09/23/23 0708  AST 17  ALT 15  ALKPHOS 107  BILITOT 0.6  PROT 5.2*  ALBUMIN 2.2*   No results for input(s): "LIPASE", "AMYLASE" in the last 168 hours. No results for input(s): "AMMONIA" in the last 168 hours. Coagulation Profile: No results for input(s): "INR", "PROTIME" in the last 168 hours. Cardiac Enzymes: No results for input(s): "CKTOTAL", "CKMB", "CKMBINDEX", "TROPONINI" in the last 168 hours. BNP (last 3 results) No results for input(s): "PROBNP" in the last 8760 hours. HbA1C: No results for input(s): "HGBA1C" in the last 72 hours. CBG: Recent Labs  Lab 09/28/23 1133 09/28/23 1534 09/28/23 2024 09/29/23 0746 09/29/23 1115  GLUCAP 150* 180* 161* 127* 167*   Lipid Profile: No results for input(s): "CHOL", "HDL", "LDLCALC", "TRIG", "CHOLHDL", "LDLDIRECT" in the last 72 hours. Thyroid Function Tests: No results for input(s): "TSH",  "T4TOTAL", "FREET4", "T3FREE", "THYROIDAB" in the last 72 hours. Anemia Panel: No results for input(s): "VITAMINB12", "FOLATE", "FERRITIN", "TIBC", "IRON", "RETICCTPCT" in the last 72 hours. Sepsis Labs: No results for input(s): "PROCALCITON", "LATICACIDVEN" in the last 168 hours.  Recent Results (from the past 240 hours)  Resp panel by RT-PCR (RSV, Flu A&B, Covid) Anterior Nasal Swab     Status: None   Collection Time: 09/21/23  8:16 PM   Specimen: Anterior Nasal Swab  Result Value Ref Range Status   SARS Coronavirus 2 by RT PCR NEGATIVE NEGATIVE Final    Comment: (NOTE) SARS-CoV-2 target nucleic acids are NOT DETECTED.  The SARS-CoV-2 RNA is generally detectable in upper respiratory specimens during the acute phase of infection. The  lowest concentration of SARS-CoV-2 viral copies this assay can detect is 138 copies/mL. A negative result does not preclude SARS-Cov-2 infection and should not be used as the sole basis for treatment or other patient management decisions. A negative result may occur with  improper specimen collection/handling, submission of specimen other than nasopharyngeal swab, presence of viral mutation(s) within the areas targeted by this assay, and inadequate number of viral copies(<138 copies/mL). A negative result must be combined with clinical observations, patient history, and epidemiological information. The expected result is Negative.  Fact Sheet for Patients:  BloggerCourse.com  Fact Sheet for Healthcare Providers:  SeriousBroker.it  This test is no t yet approved or cleared by the [place] FDA and  has been authorized for detection and/or diagnosis of SARS-CoV-2 by FDA under an Emergency Use Authorization (EUA). This EUA will remain  in effect (meaning this test can be used) for the duration of the COVID-19 declaration under Section 564(b)(1) of the Act, 21 U.S.C.section 360bbb-3(b)(1), unless  the authorization is terminated  or revoked sooner.       Influenza A by PCR NEGATIVE NEGATIVE Final   Influenza B by PCR NEGATIVE NEGATIVE Final    Comment: (NOTE) The Xpert Xpress SARS-CoV-2/FLU/RSV plus assay is intended as an aid in the diagnosis of influenza from Nasopharyngeal swab specimens and should not be used as a sole basis for treatment. Nasal washings and aspirates are unacceptable for Xpert Xpress SARS-CoV-2/FLU/RSV testing.  Fact Sheet for Patients: BloggerCourse.com  Fact Sheet for Healthcare Providers: SeriousBroker.it  This test is not yet approved or cleared by the [place] FDA and has been authorized for detection and/or diagnosis of SARS-CoV-2 by FDA under an Emergency Use Authorization (EUA). This EUA will remain in effect (meaning this test can be used) for the duration of the COVID-19 declaration under Section 564(b)(1) of the Act, 21 U.S.C. section 360bbb-3(b)(1), unless the authorization is terminated or revoked.     Resp Syncytial Virus by PCR NEGATIVE NEGATIVE Final    Comment: (NOTE) Fact Sheet for Patients: BloggerCourse.com  Fact Sheet for Healthcare Providers: SeriousBroker.it  This test is not yet approved or cleared by the [place] FDA and has been authorized for detection and/or diagnosis of SARS-CoV-2 by FDA under an Emergency Use Authorization (EUA). This EUA will remain in effect (meaning this test can be used) for the duration of the COVID-19 declaration under Section 564(b)(1) of the Act, 21 U.S.C. section 360bbb-3(b)(1), unless the authorization is terminated or revoked.  Performed at [facility], [address] [place], [place] [address]    Radiology Studies: No results found.  Scheduled Meds:  aspirin EC  81 mg Oral Daily   atorvastatin  80 mg Oral Daily   buPROPion  300 mg Oral Daily    carvedilol  6.25 mg Oral BID WC   cyanocobalamin  1,000 mcg Oral Daily   DULoxetine  60 mg Oral QPM   Gerhardt's butt cream   Topical BID   heparin  5,000 Units Subcutaneous Q8H   insulin aspart  0-15 Units Subcutaneous TID WC   insulin aspart  0-5 Units Subcutaneous QHS   leptospermum manuka honey  1 Application Topical Daily   lidocaine  1 patch Transdermal Q24H   Continuous Infusions:   LOS: 5 days    Time spent: 35 mins    Willeen Niece, MD [facility]   If 7PM-7AM, please contact night-coverage

## 2023-09-30 DIAGNOSIS — E876 Hypokalemia: Secondary | ICD-10-CM | POA: Diagnosis not present

## 2023-09-30 LAB — MAGNESIUM: Magnesium: 1.4 mg/dL — ABNORMAL LOW (ref 1.7–2.4)

## 2023-09-30 LAB — GLUCOSE, CAPILLARY
Glucose-Capillary: 118 mg/dL — ABNORMAL HIGH (ref 70–99)
Glucose-Capillary: 154 mg/dL — ABNORMAL HIGH (ref 70–99)
Glucose-Capillary: 169 mg/dL — ABNORMAL HIGH (ref 70–99)
Glucose-Capillary: 154 mg/dL — ABNORMAL HIGH (ref 70–99)
Glucose-Capillary: 118 mg/dL — ABNORMAL HIGH (ref 70–99)

## 2023-09-30 MED ORDER — MAGNESIUM SULFATE 2 GM/50ML IV SOLN
2.0000 g | Freq: Once | INTRAVENOUS | Status: AC
  Administered 2023-09-30: 2 g via INTRAVENOUS
  Filled 2023-09-30: qty 50

## 2023-09-30 NOTE — Plan of Care (Signed)

## 2023-09-30 NOTE — Progress Notes (Signed)
A 16 Fr. Foley catheter was  inserted using sterile technique with Norton Pastel, RN. Cloudy urine returned. The Patient tolerated the procedure well.

## 2023-09-30 NOTE — Progress Notes (Signed)
PROGRESS NOTE    Devin Combs  ZOX:[id number] DOB: 1951-07-22 DOA: 09/21/2023 PCP: Elias Else, MD   Brief Narrative:  This 73 year old male with prior history of autonomic dysfunction, prior history of COVID with long COVID symptoms, history of hypertension, coronary artery disease, chronic back pain with a history of lumbar fusion, bedridden for the last 2 years presented with worsening decubitus ulcers and wife requesting that patient be placed into rehab facility. On presentation, MRI of brain did not show any acute intracranial abnormity. Lumbar spine did not show acute abnormality in the lumbar spine. PT recommended SNF placement. TOC consulted.   Assessment & Plan:   Principal Problem:   Hypokalemia Active Problems:   Hypomagnesemia   Decubitus ulcers   Essential hypertension   Hyperlipidemia   DM (diabetes mellitus), type 2 (HCC)   Bedbound   AKI (acute kidney injury) (HCC)   Decubitus ulcers:  POA Decubitus noted on right and left buttocks, left posterior thigh. Continue local care.  Wound care consulted.  No signs of infection.   Hypokalemia: Replaced.  Continue to monitor   Hypomagnesemia Replaced.  Continue to monitor   Acute kidney injury: > Resolved. Creatinine on admission 1.43. Baseline creatinine normal. Encourage oral intake. Currently on gentle hydration.   DC IV fluids.  Resumed losartan.   Hypertension: Continue Coreg and losartan.  Hyperlipidemia: Continue Lipitor.   Diabetes mellitus type 2  Continue CBGs with SSI.  Hold metformin.   Bed bound : History of autonomic dysfunction. PT recommending SNF placement.  TOC consulted Peer 2 peer completed, SNF declined.  Family deciding for appeal.   Depression: Continue bupropion and duloxetine.  Outpatient follow-up with PCP   Goals of care: Overall prognosis is guarded to poor.  Palliative care consulted.  Patient is hopeful for rehab.   Obesity class I -Outpatient follow-up    Leukocytosis: -Resolved.     DVT prophylaxis: Heparin sq Code Status: Full code Family Communication: Wife at bed side Disposition Plan:   Status is: Inpatient Remains inpatient appropriate because: Medically clear, awaiting SNF placement.  Peer 2 peer completed, SNF declined.  Family appealed against denial.  Consultants:  Palliative care  Procedures: None  Antimicrobials:  Anti-infectives (From admission, onward)    Start     Dose/Rate Route Frequency Ordered Stop   09/21/23 2100  cefTRIAXone (ROCEPHIN) 1 g in sodium chloride 0.9 % 100 mL IVPB        1 g 200 mL/hr over 30 Minutes Intravenous  Once 09/21/23 2056 09/21/23 2159      Subjective: Patient was seen and examined at bedside. Overnight events noted.   Patient reports feeling better, has developed urinary retention overnight.  Appeal is in process. Patient has decubitus ulcers which are not infected and healing well.  Objective: Vitals:   09/29/23 2037 09/29/23 2147 09/30/23 0407 09/30/23 0739  BP: (!) 164/82 (!) 160/84 (!) 179/91 (!) 150/82  Pulse: 70 69 79 77  Resp: 17  18 18   Temp: 98.5 F (36.9 C)  98.4 F (36.9 C) 98.4 F (36.9 C)  TempSrc: Oral  Oral Oral  SpO2: 97%  95% 97%  Weight:      Height:        Intake/Output Summary (Last 24 hours) at 09/30/2023 1304 Last data filed at 09/30/2023 1011 Gross per 24 hour  Intake 120 ml  Output 3900 ml  Net -3780 ml   Filed Weights   09/22/23 0642  Weight: 107.4 kg    Examination:  General  exam: Appears comfortable, deconditioned, not in any acute distress. Respiratory system: CTA bilaterally. Respiratory effort normal.  RR 14 Cardiovascular system: S1 & S2 heard, RRR. No JVD, murmurs, rubs, gallops or clicks.  Gastrointestinal system: Abdomen is non distended, soft and non tender. Normal bowel sounds heard. Central nervous system: Alert and oriented x 3. No focal neurological deficits. Extremities: Edema+, no cyanosis, no clubbing Skin:  Decubitus + No rashes, lesions or ulcers Psychiatry: Judgement and insight appear normal. Mood & affect appropriate.     Data Reviewed: I have personally reviewed following labs and imaging studies  CBC: Recent Labs  Lab 09/29/23 0656  WBC 9.9  HGB 12.8*  HCT 38.7*  MCV 88.6  PLT 294   Basic Metabolic Panel: Recent Labs  Lab 09/24/23 0623 09/25/23 0554 09/26/23 0622 09/27/23 0623 09/28/23 0753 09/29/23 0656 09/30/23 0655  NA 137 139 136  --   --  137  --   K 3.9 4.0 4.1  --   --  3.9  --   CL 100 101 102  --   --  102  --   CO2 27 24 25   --   --  24  --   GLUCOSE 112* 116* 127*  --   --  100*  --   BUN 21 19 16   --   --  15  --   CREATININE 1.52* 1.26* 1.17  --   --  0.80  --   CALCIUM 8.8* 9.0 9.1  --   --  9.3  --   MG 1.6* 1.4* 1.6* 1.6* 1.5* 1.5* 1.4*  PHOS  --   --   --   --   --  3.3  --    GFR: Estimated Creatinine Clearance: 107.3 mL/min (by C-G formula based on SCr of 0.8 mg/dL). Liver Function Tests: No results for input(s): "AST", "ALT", "ALKPHOS", "BILITOT", "PROT", "ALBUMIN" in the last 168 hours.  No results for input(s): "LIPASE", "AMYLASE" in the last 168 hours. No results for input(s): "AMMONIA" in the last 168 hours. Coagulation Profile: No results for input(s): "INR", "PROTIME" in the last 168 hours. Cardiac Enzymes: No results for input(s): "CKTOTAL", "CKMB", "CKMBINDEX", "TROPONINI" in the last 168 hours. BNP (last 3 results) No results for input(s): "PROBNP" in the last 8760 hours. HbA1C: No results for input(s): "HGBA1C" in the last 72 hours. CBG: Recent Labs  Lab 09/29/23 1115 09/29/23 1539 09/29/23 2016 09/30/23 0738 09/30/23 1201  GLUCAP 167* 200* 136* 118* 154*   Lipid Profile: No results for input(s): "CHOL", "HDL", "LDLCALC", "TRIG", "CHOLHDL", "LDLDIRECT" in the last 72 hours. Thyroid Function Tests: No results for input(s): "TSH", "T4TOTAL", "FREET4", "T3FREE", "THYROIDAB" in the last 72 hours. Anemia Panel: No results  for input(s): "VITAMINB12", "FOLATE", "FERRITIN", "TIBC", "IRON", "RETICCTPCT" in the last 72 hours. Sepsis Labs: No results for input(s): "PROCALCITON", "LATICACIDVEN" in the last 168 hours.  Recent Results (from the past 240 hours)  Resp panel by RT-PCR (RSV, Flu A&B, Covid) Anterior Nasal Swab     Status: None   Collection Time: 09/21/23  8:16 PM   Specimen: Anterior Nasal Swab  Result Value Ref Range Status   SARS Coronavirus 2 by RT PCR NEGATIVE NEGATIVE Final    Comment: (NOTE) SARS-CoV-2 target nucleic acids are NOT DETECTED.  The SARS-CoV-2 RNA is generally detectable in upper respiratory specimens during the acute phase of infection. The lowest concentration of SARS-CoV-2 viral copies this assay can detect is 138 copies/mL. A negative result does not  preclude SARS-Cov-2 infection and should not be used as the sole basis for treatment or other patient management decisions. A negative result may occur with  improper specimen collection/handling, submission of specimen other than nasopharyngeal swab, presence of viral mutation(s) within the areas targeted by this assay, and inadequate number of viral copies(<138 copies/mL). A negative result must be combined with clinical observations, patient history, and epidemiological information. The expected result is Negative.  Fact Sheet for Patients:  BloggerCourse.com  Fact Sheet for Healthcare Providers:  SeriousBroker.it  This test is no t yet approved or cleared by the [place] FDA and  has been authorized for detection and/or diagnosis of SARS-CoV-2 by FDA under an Emergency Use Authorization (EUA). This EUA will remain  in effect (meaning this test can be used) for the duration of the COVID-19 declaration under Section 564(b)(1) of the Act, 21 U.S.C.section 360bbb-3(b)(1), unless the authorization is terminated  or revoked sooner.       Influenza A by PCR NEGATIVE  NEGATIVE Final   Influenza B by PCR NEGATIVE NEGATIVE Final    Comment: (NOTE) The Xpert Xpress SARS-CoV-2/FLU/RSV plus assay is intended as an aid in the diagnosis of influenza from Nasopharyngeal swab specimens and should not be used as a sole basis for treatment. Nasal washings and aspirates are unacceptable for Xpert Xpress SARS-CoV-2/FLU/RSV testing.  Fact Sheet for Patients: BloggerCourse.com  Fact Sheet for Healthcare Providers: SeriousBroker.it  This test is not yet approved or cleared by the [place] FDA and has been authorized for detection and/or diagnosis of SARS-CoV-2 by FDA under an Emergency Use Authorization (EUA). This EUA will remain in effect (meaning this test can be used) for the duration of the COVID-19 declaration under Section 564(b)(1) of the Act, 21 U.S.C. section 360bbb-3(b)(1), unless the authorization is terminated or revoked.     Resp Syncytial Virus by PCR NEGATIVE NEGATIVE Final    Comment: (NOTE) Fact Sheet for Patients: BloggerCourse.com  Fact Sheet for Healthcare Providers: SeriousBroker.it  This test is not yet approved or cleared by the [place] FDA and has been authorized for detection and/or diagnosis of SARS-CoV-2 by FDA under an Emergency Use Authorization (EUA). This EUA will remain in effect (meaning this test can be used) for the duration of the COVID-19 declaration under Section 564(b)(1) of the Act, 21 U.S.C. section 360bbb-3(b)(1), unless the authorization is terminated or revoked.  Performed at [facility], [address] [place], [place] [address]    Radiology Studies: No results found.  Scheduled Meds:  aspirin EC  81 mg Oral Daily   atorvastatin  80 mg Oral Daily   buPROPion  300 mg Oral Daily   carvedilol  6.25 mg Oral BID WC   cyanocobalamin  1,000 mcg Oral Daily   DULoxetine  60 mg  Oral QPM   Gerhardt's butt cream   Topical BID   heparin  5,000 Units Subcutaneous Q8H   insulin aspart  0-15 Units Subcutaneous TID WC   insulin aspart  0-5 Units Subcutaneous QHS   leptospermum manuka honey  1 Application Topical Daily   lidocaine  1 patch Transdermal Q24H   Continuous Infusions:   LOS: 6 days    Time spent: 35 mins    Willeen Niece, MD [facility]   If 7PM-7AM, please contact night-coverage

## 2023-10-01 DIAGNOSIS — E876 Hypokalemia: Secondary | ICD-10-CM | POA: Diagnosis not present

## 2023-10-01 LAB — BASIC METABOLIC PANEL
Anion gap: 14 (ref 5–15)
BUN: 18 mg/dL (ref 8–23)
CO2: 17 mmol/L — ABNORMAL LOW (ref 22–32)
Calcium: 9.1 mg/dL (ref 8.9–10.3)
Chloride: 106 mmol/L (ref 98–111)
Creatinine, Ser: 0.86 mg/dL (ref 0.61–1.24)
GFR, Estimated: >60 mL/min (ref >60)
Glucose, Bld: 105 mg/dL — ABNORMAL HIGH (ref 70–99)
Potassium: 4.4 mmol/L (ref 3.5–5.1)
Sodium: 137 mmol/L (ref 135–145)

## 2023-10-01 LAB — GLUCOSE, CAPILLARY
Glucose-Capillary: 113 mg/dL — ABNORMAL HIGH (ref 70–99)
Glucose-Capillary: 175 mg/dL — ABNORMAL HIGH (ref 70–99)
Glucose-Capillary: 132 mg/dL — ABNORMAL HIGH (ref 70–99)
Glucose-Capillary: 163 mg/dL — ABNORMAL HIGH (ref 70–99)

## 2023-10-01 LAB — MAGNESIUM: Magnesium: 1.5 mg/dL — ABNORMAL LOW (ref 1.7–2.4)

## 2023-10-01 MED ORDER — MAGNESIUM SULFATE 2 GM/50ML IV SOLN
2.0000 g | Freq: Once | INTRAVENOUS | Status: AC
  Administered 2023-10-01: 2 g via INTRAVENOUS
  Filled 2023-10-01: qty 50

## 2023-10-01 MED ORDER — CHLORHEXIDINE GLUCONATE CLOTH 2 % EX PADS
6.0000 | MEDICATED_PAD | Freq: Every day | CUTANEOUS | Status: DC
  Administered 2023-10-01 – 2023-10-05 (×5): 6 via TOPICAL

## 2023-10-01 NOTE — Plan of Care (Signed)

## 2023-10-01 NOTE — Progress Notes (Signed)
Physical Therapy Treatment Patient Details Name: Devin Combs MRN: [id number] DOB: November 28, 1950 Today's Date: 10/01/2023   History of Present Illness 73 year old Caucasian male presents 2/7 with complaints of recent fecal and urinary incontinence. Labs showed Hypokalemia, and Hypomagnesemia. He has also developed decubitus ulcers on the right hip, left buttock and left posterior thigh last week. Prior history of autonomic dysfunction, prior history of COVID with long COVID symptoms, history of hypertension, coronary artery disease, chronic back pain with a history of lumbar fusion, bedridden for the last 2 years,    PT Comments  Tolerated standing from EOB with use of Sara +. Pt with great effort pushing through UEs and LEs. Totaled 3 stands, 30 seconds on the first two while performing isometric postural exercises; third stand tolerated over 1 minute. Mod assist +2 for bed mobility, back pain with transitions from sidelying to sitting but improves once vertical on EOB and holding himself up with BIL UEs. Patient will benefit from continued inpatient follow up therapy, <3 hours/day. Tolerated LE exercise review well. Patient will continue to benefit from skilled physical therapy services to further improve independence with functional mobility.     If plan is discharge home, recommend the following: A lot of help with bathing/dressing/bathroom;Assistance with cooking/housework;Direct supervision/assist for medications management;Direct supervision/assist for financial management;Assist for transportation;Help with stairs or ramp for entrance;Two people to help with walking and/or transfers   Can travel by private vehicle     No  Equipment Recommendations  None recommended by PT    Recommendations for Other Services       Precautions / Restrictions Precautions Precautions: Fall Recall of Precautions/Restrictions: Impaired Precaution/Restrictions Comments: bed sores Restrictions Weight  Bearing Restrictions Per Provider Order: No     Mobility  Bed Mobility Overal bed mobility: Needs Assistance Bed Mobility: Rolling, Sidelying to Sit, Sit to Sidelying Rolling: Used rails, Mod assist Sidelying to sit: Used rails, HOB elevated, Mod assist, +2 for physical assistance     Sit to sidelying: Used rails, Mod assist, +2 for safety/equipment General bed mobility comments: Mod assist to roll, requires cues to grab rail, poor recall. Very slow initiation with LEs, required assist to bring them off the bed completely. Heavy assist with trunk, mod +2, leaning posteriorly. Painful transition until seated upright. Mod assist to lower back into sidelying, with VC, and support for LEs. Better job to repositioning LEs in midline with some help.    Transfers Overall transfer level: Needs assistance Equipment used: Ambulation equipment used Transfers: Sit to/from Stand Sit to Stand: Via lift equipment           General transfer comment: [place] Dec Lift utilized for sit to stand transitions. Performed 3 times with full stand, getting extension through Knees, hips and back in near neutral alignment. Pt with good effort throughout. Utilizing LEs and UEs. Transfer via Lift Equipment: Kandee Keen  Ambulation/Gait               General Gait Details: n/a   Comptroller Bed    Modified Rankin (Stroke Patients Only)       Balance Overall balance assessment: Needs assistance Sitting-balance support: Bilateral upper extremity supported, Feet supported Sitting balance-Leahy Scale: Poor Sitting balance - Comments: Intermittent assist for LOB EOB. Leans posteriorly Postural control: Posterior lean Standing balance support: Bilateral upper extremity supported, Reliant on assistive device for balance Standing balance-Leahy Scale: Zero Standing balance comment:  Stood 3 times with [place] back support and UEs on Chief Technology Officer Communication: Impaired Factors Affecting Communication: Hearing impaired  Cognition Arousal: Alert Behavior During Therapy: WFL for tasks assessed/performed   PT - Cognitive impairments: Initiation, Sequencing, Problem solving, Safety/Judgement                         Following commands: Impaired Following commands impaired: Follows multi-step commands inconsistently, Follows multi-step commands with increased time, Follows one step commands with increased time    Cueing Cueing Techniques: Verbal cues, Tactile cues  Exercises General Exercises - Lower Extremity Ankle Circles/Pumps: AROM, Both, Supine, 10 reps Quad Sets: Strengthening, Both, 10 reps, Supine Gluteal Sets: Strengthening, Both, 10 reps, Supine Hip ABduction/ADduction: Strengthening, Both, 10 reps, Supine Other Exercises Other Exercises: Seated scapular retraction, thoracic extension x5 Other Exercises: Standing quad and glute contractions x 3 each stand (3 stands total)    General Comments        Pertinent Vitals/Pain Pain Assessment Pain Assessment: Faces Faces Pain Scale: Hurts even more Pain Location: back, pressure ulcers Pain Descriptors / Indicators: Grimacing, Aching, Discomfort    Home Living                          Prior Function            PT Goals (current goals can now be found in the care plan section) Acute Rehab PT Goals Patient Stated Goal: Be able to get into wheelchair, eventually walk, or golf. PT Goal Formulation: With patient/family Time For Goal Achievement: 10/06/23 Potential to Achieve Goals: Fair Progress towards PT goals: Progressing toward goals    Frequency    Min 1X/week      PT Plan      Co-evaluation   Reason for Co-Treatment: Complexity of the patient's impairments (multi-system involvement);For patient/therapist safety;To address functional/ADL transfers          AM-PAC PT "6 Clicks" Mobility    Outcome Measure  Help needed turning from your back to your side while in a flat bed without using bedrails?: A Lot Help needed moving from lying on your back to sitting on the side of a flat bed without using bedrails?: A Lot Help needed moving to and from a bed to a chair (including a wheelchair)?: Total Help needed standing up from a chair using your arms (e.g., wheelchair or bedside chair)?: Total Help needed to walk in hospital room?: Total Help needed climbing 3-5 steps with a railing? : Total 6 Click Score: 8    End of Session   Activity Tolerance: Patient tolerated treatment well Patient left: in bed;with call bell/phone within reach;with bed alarm set;with family/visitor present;with SCD's reapplied Nurse Communication: Mobility status;Need for lift equipment PT Visit Diagnosis: Muscle weakness (generalized) (M62.81);Difficulty in walking, not elsewhere classified (R26.2);Other symptoms and signs involving the nervous system (R29.898);Pain Pain - part of body:  (back, wounds)     Time: 0981-1914 PT Time Calculation (min) (ACUTE ONLY): 27 min  Charges:    $Therapeutic Exercise: 8-22 mins $Therapeutic Activity: 8-22 mins PT General Charges $$ ACUTE PT VISIT: 1 Visit                     Kathlyn Sacramento, PT, DPT [facility]  Rehabilitation  Services Physical Therapist Office: [phone] Website: [email or web address]    Berton Mount 10/01/2023, 4:25 PM

## 2023-10-01 NOTE — Progress Notes (Signed)
PROGRESS NOTE    Devin Combs  ZOX:[id number] DOB: [birth year] DOA: 09/21/2023 PCP: Elias Else, MD   Brief Narrative:  This 73 year old male with prior history of autonomic dysfunction, prior history of COVID with long COVID symptoms, history of hypertension, coronary artery disease, chronic back pain with a history of lumbar fusion, bedridden for the last 2 years presented with worsening decubitus ulcers and wife requesting that patient be placed into rehab facility. On presentation, MRI of brain did not show any acute intracranial abnormity. Lumbar spine did not show acute abnormality in the lumbar spine. PT recommended SNF placement. TOC consulted.   Assessment & Plan:   Principal Problem:   Hypokalemia Active Problems:   Hypomagnesemia   Decubitus ulcers   Essential hypertension   Hyperlipidemia   DM (diabetes mellitus), type 2 (HCC)   Bedbound   AKI (acute kidney injury) (HCC)   Decubitus ulcers:  POA Decubitus noted on right and left buttocks, left posterior thigh. Continue local care.  Wound care consulted.  No signs of infection.   Hypokalemia: Replaced.  Continue to monitor   Hypomagnesemia Replaced.  Continue to monitor   Acute kidney injury: > Resolved. Creatinine on admission 1.43. Baseline creatinine normal. Encourage oral intake. Currently on gentle hydration.   DC IV fluids.  Resumed losartan.   Hypertension: Continue Coreg and losartan.  Hyperlipidemia: Continue Lipitor.   Diabetes mellitus type 2  Continue CBGs with SSI.  Hold metformin.   Bed bound : History of autonomic dysfunction. PT recommending SNF placement.  TOC consulted Peer 2 peer completed, SNF declined.  Family appealed, decision pending .   Depression: Continue bupropion and duloxetine.  Outpatient follow-up with PCP   Goals of care: Overall prognosis is guarded to poor.  Palliative care consulted.  Patient is hopeful for rehab.   Obesity class I -Outpatient follow-up    Leukocytosis: -Resolved.  Hematuria : Resolved. Patient has bloody urine following Foley catheterization. Likely due to traumatic urinary catheterization.  UA now clear.   DVT prophylaxis: Heparin sq Code Status: Full code Family Communication: Wife at bed side. Disposition Plan:   Status is: Inpatient Remains inpatient appropriate because: Medically clear, awaiting SNF placement.  Peer 2 peer completed, SNF declined.  Family appealed against denial.  Consultants:  Palliative care  Procedures: None  Antimicrobials:  Anti-infectives (From admission, onward)    Start     Dose/Rate Route Frequency Ordered Stop   09/21/23 2100  cefTRIAXone (ROCEPHIN) 1 g in sodium chloride 0.9 % 100 mL IVPB        1 g 200 mL/hr over 30 Minutes Intravenous  Once 09/21/23 2056 09/21/23 2159      Subjective: Patient was seen and examined at bedside. Overnight events noted.   Patient reports feeling better,  has developed urinary retention requiring Foley catheter.  Hematuria has resolved.   Appeal is in process. Patient has decubitus ulcers which are not infected and healing well.  Objective: Vitals:   09/30/23 1637 09/30/23 2012 10/01/23 0548 10/01/23 0725  BP: (!) 143/84 (!) 142/79 121/72 (!) 119/55  Pulse: 80 73 80 72  Resp: 19 18 18 16   Temp: 98 F (36.7 C) 98.6 F (37 C) 98.3 F (36.8 C) 98.2 F (36.8 C)  TempSrc: Oral   Oral  SpO2: 96% 96% 95% 96%  Weight:   111.2 kg   Height:        Intake/Output Summary (Last 24 hours) at 10/01/2023 1213 Last data filed at 09/30/2023 2322 Gross per 24  hour  Intake --  Output 1325 ml  Net -1325 ml   Filed Weights   09/22/23 0642 10/01/23 0548  Weight: 107.4 kg 111.2 kg    Examination:  General exam: Appears comfortable, deconditioned, not in any acute distress. Respiratory system: CTA bilaterally. Respiratory effort normal.  RR 15 Cardiovascular system: S1 & S2 heard, RRR. No JVD, murmurs, rubs, gallops or clicks.   Gastrointestinal system: Abdomen is non distended, soft and non tender. Normal bowel sounds heard. Central nervous system: Alert and oriented x 3. No focal neurological deficits. Extremities: Edema+, no cyanosis, no clubbing Skin: Decubitus + No rashes, lesions or ulcers Psychiatry: Judgement and insight appear normal. Mood & affect appropriate.     Data Reviewed: I have personally reviewed following labs and imaging studies  CBC: Recent Labs  Lab 09/29/23 0656  WBC 9.9  HGB 12.8*  HCT 38.7*  MCV 88.6  PLT 294   Basic Metabolic Panel: Recent Labs  Lab 09/25/23 0554 09/26/23 0622 09/27/23 0623 09/28/23 0753 09/29/23 0656 09/30/23 0655 10/01/23 0642  NA 139 136  --   --  137  --   --   K 4.0 4.1  --   --  3.9  --   --   CL 101 102  --   --  102  --   --   CO2 24 25  --   --  24  --   --   GLUCOSE 116* 127*  --   --  100*  --   --   BUN 19 16  --   --  15  --   --   CREATININE 1.26* 1.17  --   --  0.80  --   --   CALCIUM 9.0 9.1  --   --  9.3  --   --   MG 1.4* 1.6* 1.6* 1.5* 1.5* 1.4* 1.5*  PHOS  --   --   --   --  3.3  --   --    GFR: Estimated Creatinine Clearance: 109.1 mL/min (by C-G formula based on SCr of 0.8 mg/dL). Liver Function Tests: No results for input(s): "AST", "ALT", "ALKPHOS", "BILITOT", "PROT", "ALBUMIN" in the last 168 hours.  No results for input(s): "LIPASE", "AMYLASE" in the last 168 hours. No results for input(s): "AMMONIA" in the last 168 hours. Coagulation Profile: No results for input(s): "INR", "PROTIME" in the last 168 hours. Cardiac Enzymes: No results for input(s): "CKTOTAL", "CKMB", "CKMBINDEX", "TROPONINI" in the last 168 hours. BNP (last 3 results) No results for input(s): "PROBNP" in the last 8760 hours. HbA1C: No results for input(s): "HGBA1C" in the last 72 hours. CBG: Recent Labs  Lab 09/30/23 1635 09/30/23 2012 09/30/23 2326 10/01/23 0730 10/01/23 1150  GLUCAP 169* 154* 118* 113* 175*   Lipid Profile: No results  for input(s): "CHOL", "HDL", "LDLCALC", "TRIG", "CHOLHDL", "LDLDIRECT" in the last 72 hours. Thyroid Function Tests: No results for input(s): "TSH", "T4TOTAL", "FREET4", "T3FREE", "THYROIDAB" in the last 72 hours. Anemia Panel: No results for input(s): "VITAMINB12", "FOLATE", "FERRITIN", "TIBC", "IRON", "RETICCTPCT" in the last 72 hours. Sepsis Labs: No results for input(s): "PROCALCITON", "LATICACIDVEN" in the last 168 hours.  Recent Results (from the past 240 hours)  Resp panel by RT-PCR (RSV, Flu A&B, Covid) Anterior Nasal Swab     Status: None   Collection Time: 09/21/23  8:16 PM   Specimen: Anterior Nasal Swab  Result Value Ref Range Status   SARS Coronavirus 2 by RT PCR NEGATIVE  NEGATIVE Final    Comment: (NOTE) SARS-CoV-2 target nucleic acids are NOT DETECTED.  The SARS-CoV-2 RNA is generally detectable in upper respiratory specimens during the acute phase of infection. The lowest concentration of SARS-CoV-2 viral copies this assay can detect is 138 copies/mL. A negative result does not preclude SARS-Cov-2 infection and should not be used as the sole basis for treatment or other patient management decisions. A negative result may occur with  improper specimen collection/handling, submission of specimen other than nasopharyngeal swab, presence of viral mutation(s) within the areas targeted by this assay, and inadequate number of viral copies(<138 copies/mL). A negative result must be combined with clinical observations, patient history, and epidemiological information. The expected result is Negative.  Fact Sheet for Patients:  BloggerCourse.com  Fact Sheet for Healthcare Providers:  SeriousBroker.it  This test is no t yet approved or cleared by the [place] FDA and  has been authorized for detection and/or diagnosis of SARS-CoV-2 by FDA under an Emergency Use Authorization (EUA). This EUA will remain  in effect  (meaning this test can be used) for the duration of the COVID-19 declaration under Section 564(b)(1) of the Act, 21 U.S.C.section 360bbb-3(b)(1), unless the authorization is terminated  or revoked sooner.       Influenza A by PCR NEGATIVE NEGATIVE Final   Influenza B by PCR NEGATIVE NEGATIVE Final    Comment: (NOTE) The Xpert Xpress SARS-CoV-2/FLU/RSV plus assay is intended as an aid in the diagnosis of influenza from Nasopharyngeal swab specimens and should not be used as a sole basis for treatment. Nasal washings and aspirates are unacceptable for Xpert Xpress SARS-CoV-2/FLU/RSV testing.  Fact Sheet for Patients: BloggerCourse.com  Fact Sheet for Healthcare Providers: SeriousBroker.it  This test is not yet approved or cleared by the [place] FDA and has been authorized for detection and/or diagnosis of SARS-CoV-2 by FDA under an Emergency Use Authorization (EUA). This EUA will remain in effect (meaning this test can be used) for the duration of the COVID-19 declaration under Section 564(b)(1) of the Act, 21 U.S.C. section 360bbb-3(b)(1), unless the authorization is terminated or revoked.     Resp Syncytial Virus by PCR NEGATIVE NEGATIVE Final    Comment: (NOTE) Fact Sheet for Patients: BloggerCourse.com  Fact Sheet for Healthcare Providers: SeriousBroker.it  This test is not yet approved or cleared by the [place] FDA and has been authorized for detection and/or diagnosis of SARS-CoV-2 by FDA under an Emergency Use Authorization (EUA). This EUA will remain in effect (meaning this test can be used) for the duration of the COVID-19 declaration under Section 564(b)(1) of the Act, 21 U.S.C. section 360bbb-3(b)(1), unless the authorization is terminated or revoked.  Performed at [facility], [address], [place], [place] [address]     Radiology Studies: No results found.  Scheduled Meds:  aspirin EC  81 mg Oral Daily   atorvastatin  80 mg Oral Daily   buPROPion  300 mg Oral Daily   carvedilol  6.25 mg Oral BID WC   Chlorhexidine Gluconate Cloth  6 each Topical Daily   cyanocobalamin  1,000 mcg Oral Daily   DULoxetine  60 mg Oral QPM   Gerhardt's butt cream   Topical BID   heparin  5,000 Units Subcutaneous Q8H   insulin aspart  0-15 Units Subcutaneous TID WC   insulin aspart  0-5 Units Subcutaneous QHS   leptospermum manuka honey  1 Application Topical Daily   lidocaine  1 patch Transdermal Q24H   Continuous  Infusions:   LOS: 7 days    Time spent: 35 mins    Willeen Niece, MD [facility]   If 7PM-7AM, please contact night-coverage

## 2023-10-02 DIAGNOSIS — E876 Hypokalemia: Secondary | ICD-10-CM | POA: Diagnosis not present

## 2023-10-02 LAB — GLUCOSE, CAPILLARY
Glucose-Capillary: 133 mg/dL — ABNORMAL HIGH (ref 70–99)
Glucose-Capillary: 150 mg/dL — ABNORMAL HIGH (ref 70–99)
Glucose-Capillary: 150 mg/dL — ABNORMAL HIGH (ref 70–99)
Glucose-Capillary: 210 mg/dL — ABNORMAL HIGH (ref 70–99)

## 2023-10-02 NOTE — Progress Notes (Signed)
Occupational Therapy Treatment Patient Details Name: Devin Combs MRN: [id number] DOB: [birth year] Today's Date: 10/02/2023   History of present illness 73 year old Caucasian male presents 2/7 with complaints of recent fecal and urinary incontinence. Labs showed Hypokalemia, and Hypomagnesemia. He has also developed decubitus ulcers on the right hip, left buttock and left posterior thigh last week. Prior history of autonomic dysfunction, prior history of COVID with long COVID symptoms, history of hypertension, coronary artery disease, chronic back pain with a history of lumbar fusion, bedridden for the last 2 years,   OT comments  Pt is making continued progress towards acute OT goals. Pt continues to be limited by deficits listed below. Overall, pt continues to require up to MAX A+2 for bed mobility tasks. Pt initially required MAX A to maintain upright sitting posture once seated EOB. Pt observed with posterior lean and educated on weight shifting to regain balance. Pt progressed to CGA-MIN A with anterior weight shifts. Pt engaged in BUE AROM exercises while seated EOB to improve core and BUE strength in preparation for EOB ADLs. Pt tolerated ~10-20 reps of each exercise (shoulder flexion, scapular elevation, elbow flexion, forearm supination and pronation). OT to continue following pt acutely to address functional needs with the d/c recommendation of follow-up OT services <3hrs/day to maximize functional independence.       If plan is discharge home, recommend the following:  Two people to help with walking and/or transfers;A lot of help with bathing/dressing/bathroom;Assistance with cooking/housework;Assistance with feeding;Direct supervision/assist for medications management;Direct supervision/assist for financial management;Assist for transportation;Help with stairs or ramp for entrance   Equipment Recommendations  Other (comment) (defer)    Recommendations for Other Services PT  consult    Precautions / Restrictions Precautions Precautions: Fall Restrictions Weight Bearing Restrictions Per Provider Order: No       Mobility Bed Mobility Overal bed mobility: Needs Assistance Bed Mobility: Rolling, Sidelying to Sit, Sit to Sidelying Rolling: Used rails, Mod assist Sidelying to sit: Max assist, +2 for physical assistance, Used rails     Sit to sidelying: Max assist, +2 for physical assistance, Used rails General bed mobility comments: Reports of soreness and increased weakness on this date. Mod assist to roll, requires cues for sequencing. Pt required assist to bring BLE off and into the bed completely. Heavy assist with trunk elevation. Pt required assistance to scoot forward until B feet were on the floor.    Transfers Overall transfer level: Needs assistance                 General transfer comment: Did not attempt on this date     Balance Overall balance assessment: Needs assistance Sitting-balance support: Bilateral upper extremity supported, Feet supported Sitting balance-Leahy Scale: Poor Sitting balance - Comments: Required MAX A to maintain positioning due to posterior lean while engaging in AROM exercises seated EOB. Requires BUE to maintain positioning. Progressed to MIN-CGA with anterior weight shifting. Postural control: Posterior lean                                 ADL either performed or assessed with clinical judgement   ADL Overall ADL's : Needs assistance/impaired                                     Functional mobility during ADLs: Maximal assistance;+2 for physical assistance General ADL Comments: Pt  required MAX +2 due to weakness and reports of soreness. Verbal cues provided for sequencing and to initiate task.    Extremity/Trunk Assessment Upper Extremity Assessment Upper Extremity Assessment: Generalized weakness            Vision   Vision Assessment?: No apparent visual deficits    Perception Perception Perception: Not tested   Praxis Praxis Praxis: Not tested   Communication Communication Communication: No apparent difficulties   Cognition Arousal: Alert Behavior During Therapy: WFL for tasks assessed/performed Cognition: No apparent impairments             OT - Cognition Comments: Requires verbal cues to initiate and sequence tasks                 Following commands: Impaired Following commands impaired: Follows multi-step commands inconsistently, Follows multi-step commands with increased time, Follows one step commands with increased time      Cueing   Cueing Techniques: Verbal cues, Tactile cues  Exercises Exercises: General Upper Extremity, Other exercises General Exercises - Upper Extremity Shoulder Flexion: AROM, Both, 10 reps, Seated Elbow Flexion: 20 reps, Both, Seated, AROM Other Exercises Other Exercises: AROM; both; scapular elevation x10 Other Exercises: AROM; both; forearm supination/pronation x10    Shoulder Instructions       General Comments VSS on room air, wife at bedside    Pertinent Vitals/ Pain       Pain Assessment Pain Assessment: Faces Faces Pain Scale: Hurts little more Pain Location: lower back Pain Descriptors / Indicators: Discomfort, Grimacing Pain Intervention(s): Monitored during session, Limited activity within patient's tolerance  Home Living                                          Prior Functioning/Environment              Frequency  Min 1X/week        Progress Toward Goals  OT Goals(current goals can now be found in the care plan section)  Progress towards OT goals: Progressing toward goals  Acute Rehab OT Goals Patient Stated Goal: none stated OT Goal Formulation: With patient Time For Goal Achievement: 10/06/23 Potential to Achieve Goals: Good ADL Goals Pt Will Perform Grooming: with supervision;sitting Pt Will Perform Upper Body Dressing: with contact  guard assist;sitting Pt Will Perform Lower Body Dressing: with adaptive equipment;with min assist;bed level;sitting/lateral leans Additional ADL Goal #1: pt will perform bed mobility min A in prep for ADLs Additional ADL Goal #2: Pt will complete BUE HEP at bed level with supervision  Plan      Co-evaluation                 AM-PAC OT "6 Clicks" Daily Activity     Outcome Measure   Help from another person eating meals?: A Little Help from another person taking care of personal grooming?: A Little Help from another person toileting, which includes using toliet, bedpan, or urinal?: A Lot Help from another person bathing (including washing, rinsing, drying)?: A Lot Help from another person to put on and taking off regular upper body clothing?: A Lot Help from another person to put on and taking off regular lower body clothing?: A Lot 6 Click Score: 14    End of Session    OT Visit Diagnosis: Unsteadiness on feet (R26.81);Other abnormalities of gait and mobility (R26.89);Muscle weakness (generalized) (M62.81)   Activity  Tolerance Patient tolerated treatment well   Patient Left in bed;with call bell/phone within reach;with family/visitor present   Nurse Communication Mobility status        Time: 2956-2130 OT Time Calculation (min): 32 min  Charges: OT General Charges $OT Visit: 1 Visit OT Treatments $Therapeutic Exercise: 23-37 mins  Kevan Ny, Darliss Cheney 10/02/2023, 1:35 PM

## 2023-10-02 NOTE — Plan of Care (Signed)
Problem: Education: Goal: Ability to describe self-care measures that may prevent or decrease complications (Diabetes Survival Skills Education) will improve 10/02/2023 0627 by Crawford Givens, RN Outcome: Progressing 10/02/2023 0523 by Crawford Givens, RN Outcome: Progressing Goal: Individualized Educational Video(s) 10/02/2023 0627 by Crawford Givens, RN Outcome: Progressing 10/02/2023 0523 by Crawford Givens, RN Outcome: Progressing   Problem: Coping: Goal: Ability to adjust to condition or change in health will improve 10/02/2023 0627 by Crawford Givens, RN Outcome: Progressing 10/02/2023 0523 by Crawford Givens, RN Outcome: Progressing   Problem: Fluid Volume: Goal: Ability to maintain a balanced intake and output will improve 10/02/2023 0627 by Crawford Givens, RN Outcome: Progressing 10/02/2023 0523 by Crawford Givens, RN Outcome: Progressing   Problem: Health Behavior/Discharge Planning: Goal: Ability to identify and utilize available resources and services will improve 10/02/2023 0627 by Crawford Givens, RN Outcome: Progressing 10/02/2023 0523 by Crawford Givens, RN Outcome: Progressing Goal: Ability to manage health-related needs will improve 10/02/2023 0627 by Crawford Givens, RN Outcome: Progressing 10/02/2023 0523 by Crawford Givens, RN Outcome: Progressing   Problem: Metabolic: Goal: Ability to maintain appropriate glucose levels will improve 10/02/2023 0627 by Crawford Givens, RN Outcome: Progressing 10/02/2023 0523 by Crawford Givens, RN Outcome: Progressing   Problem: Nutritional: Goal: Maintenance of adequate nutrition will improve 10/02/2023 0627 by Crawford Givens, RN Outcome: Progressing 10/02/2023 0523 by Crawford Givens, RN Outcome: Progressing Goal: Progress toward achieving an optimal weight will improve 10/02/2023 0627 by Crawford Givens, RN Outcome: Progressing 10/02/2023 0523 by Crawford Givens, RN Outcome: Progressing   Problem:  Skin Integrity: Goal: Risk for impaired skin integrity will decrease 10/02/2023 0627 by Crawford Givens, RN Outcome: Progressing 10/02/2023 0523 by Crawford Givens, RN Outcome: Progressing   Problem: Tissue Perfusion: Goal: Adequacy of tissue perfusion will improve 10/02/2023 0627 by Crawford Givens, RN Outcome: Progressing 10/02/2023 0523 by Crawford Givens, RN Outcome: Progressing   Problem: Education: Goal: Knowledge of General Education information will improve Description: Including pain rating scale, medication(s)/side effects and non-pharmacologic comfort measures 10/02/2023 0627 by Crawford Givens, RN Outcome: Progressing 10/02/2023 0523 by Crawford Givens, RN Outcome: Progressing   Problem: Health Behavior/Discharge Planning: Goal: Ability to manage health-related needs will improve 10/02/2023 0627 by Crawford Givens, RN Outcome: Progressing 10/02/2023 0523 by Crawford Givens, RN Outcome: Progressing   Problem: Clinical Measurements: Goal: Ability to maintain clinical measurements within normal limits will improve 10/02/2023 0627 by Crawford Givens, RN Outcome: Progressing 10/02/2023 0523 by Crawford Givens, RN Outcome: Progressing Goal: Will remain free from infection 10/02/2023 0627 by Crawford Givens, RN Outcome: Progressing 10/02/2023 0523 by Crawford Givens, RN Outcome: Progressing Goal: Diagnostic test results will improve 10/02/2023 0627 by Crawford Givens, RN Outcome: Progressing 10/02/2023 0523 by Crawford Givens, RN Outcome: Progressing Goal: Respiratory complications will improve 10/02/2023 0627 by Crawford Givens, RN Outcome: Progressing 10/02/2023 0523 by Crawford Givens, RN Outcome: Progressing Goal: Cardiovascular complication will be avoided 10/02/2023 0627 by Crawford Givens, RN Outcome: Progressing 10/02/2023 0523 by Crawford Givens, RN Outcome: Progressing   Problem: Activity: Goal: Risk for activity intolerance will  decrease 10/02/2023 0627 by Crawford Givens, RN Outcome: Progressing 10/02/2023 0523 by Crawford Givens, RN Outcome: Progressing   Problem: Nutrition: Goal: Adequate nutrition will be maintained 10/02/2023 0627 by Crawford Givens, RN Outcome: Progressing 10/02/2023 0523 by Crawford Givens, RN Outcome: Progressing   Problem: Coping: Goal: Level of anxiety will decrease 10/02/2023 0627 by Crawford Givens, RN Outcome: Progressing 10/02/2023 0523 by Crawford Givens, RN Outcome: Progressing   Problem: Elimination: Goal: Will not experience complications related to bowel motility 10/02/2023 0627  by Crawford Givens, RN Outcome: Progressing 10/02/2023 0523 by Crawford Givens, RN Outcome: Progressing Goal: Will not experience complications related to urinary retention 10/02/2023 0627 by Crawford Givens, RN Outcome: Progressing 10/02/2023 0523 by Crawford Givens, RN Outcome: Progressing   Problem: Pain Managment: Goal: General experience of comfort will improve and/or be controlled 10/02/2023 0627 by Crawford Givens, RN Outcome: Progressing 10/02/2023 0523 by Crawford Givens, RN Outcome: Progressing   Problem: Safety: Goal: Ability to remain free from injury will improve 10/02/2023 0627 by Crawford Givens, RN Outcome: Progressing 10/02/2023 0523 by Crawford Givens, RN Outcome: Progressing   Problem: Skin Integrity: Goal: Risk for impaired skin integrity will decrease 10/02/2023 0627 by Crawford Givens, RN Outcome: Progressing 10/02/2023 0523 by Crawford Givens, RN Outcome: Progressing

## 2023-10-02 NOTE — Progress Notes (Signed)
Pt has foley cath in place with no orders and reached night time covering [facility] hospital[facility] for foley order, and waiting for orders at this time.

## 2023-10-02 NOTE — Plan of Care (Signed)

## 2023-10-02 NOTE — Progress Notes (Signed)
PROGRESS NOTE    Devin Combs  UJW:[id number] DOB: November 11, 1950 DOA: 09/21/2023 PCP: Elias Else, MD   Brief Narrative:  This 73 year old male with prior history of autonomic dysfunction, prior history of COVID with long COVID symptoms, history of hypertension, coronary artery disease, chronic back pain with a history of lumbar fusion, bedridden for the last 2 years presented with worsening decubitus ulcers and wife requesting that patient be placed into rehab facility. On presentation, MRI of brain did not show any acute intracranial abnormity. Lumbar spine did not show acute abnormality in the lumbar spine. PT recommended SNF placement. TOC consulted.   Assessment & Plan:   Principal Problem:   Hypokalemia Active Problems:   Hypomagnesemia   Decubitus ulcers   Essential hypertension   Hyperlipidemia   DM (diabetes mellitus), type 2 (HCC)   Bedbound   AKI (acute kidney injury) (HCC)   Decubitus ulcers:  POA Decubitus noted on right and left buttocks, left posterior thigh. Continue local care.  Wound care consulted.  No signs of infection.   Hypokalemia: Replaced.  Continue to monitor   Hypomagnesemia Replaced.  Continue to monitor   Acute kidney injury: > Resolved. Creatinine on admission 1.43. Baseline creatinine normal. Encourage oral intake. Currently on gentle hydration.   DC IV fluids.  Resumed losartan.   Hypertension: Continue Coreg and losartan.  Hyperlipidemia: Continue Lipitor.   Diabetes mellitus type 2  Continue CBGs with SSI.  Hold metformin.   Bed bound : History of autonomic dysfunction. PT recommending SNF placement.  TOC consulted Peer 2 peer completed, SNF declined.  Family appealed, decision pending .   Depression: Continue bupropion and duloxetine.  Outpatient follow-up with PCP   Goals of care: Overall prognosis is guarded to poor.  Palliative care consulted.  Patient is hopeful for rehab.  Pending decision.   Obesity class  I -Outpatient follow-up   Leukocytosis: -Resolved.  Urinary retention  / hematuria : Resolved. Patient has bloody urine following Foley catheterization. Likely due to traumatic urinary catheterization.  UA now clear. Consider Voiding trial.   DVT prophylaxis: Heparin sq Code Status: Full code Family Communication: Wife at bed side. Disposition Plan:   Status is: Inpatient Remains inpatient appropriate because: Medically clear, awaiting SNF placement.  Peer 2 peer completed, SNF declined.  Family appealed against denial.  Consultants:  Palliative care  Procedures: None  Antimicrobials:  Anti-infectives (From admission, onward)    Start     Dose/Rate Route Frequency Ordered Stop   09/21/23 2100  cefTRIAXone (ROCEPHIN) 1 g in sodium chloride 0.9 % 100 mL IVPB        1 g 200 mL/hr over 30 Minutes Intravenous  Once 09/21/23 2056 09/21/23 2159      Subjective: Patient was seen and examined at bedside. Overnight events noted.   Patient reports feeling better,  urine has became clear.  Hematuria has resolved.   Appeal is in process. Patient has decubitus ulcers which are not infected and healing well.  Objective: Vitals:   10/01/23 1613 10/01/23 1957 10/02/23 0500 10/02/23 0742  BP: 136/68 132/62 131/73 (!) 154/87  Pulse: 76 75 80 89  Resp: 18 18 18 18   Temp:  98.6 F (37 C) 98.7 F (37.1 C) (!) 97.3 F (36.3 C)  TempSrc:  Oral Oral Oral  SpO2: 95% 96% 95% 96%  Weight:   109.4 kg   Height:        Intake/Output Summary (Last 24 hours) at 10/02/2023 1341 Last data filed at 10/02/2023 [phone]  Gross per 24 hour  Intake --  Output 1650 ml  Net -1650 ml   Filed Weights   09/22/23 0642 10/01/23 0548 10/02/23 0500  Weight: 107.4 kg 111.2 kg 109.4 kg    Examination:  General exam: Appears comfortable, deconditioned, not in any acute distress. Respiratory system: CTA bilaterally. Respiratory effort normal.  RR 14 Cardiovascular system: S1 & S2 heard, RRR. No JVD,  murmurs, rubs, gallops or clicks.  Gastrointestinal system: Abdomen is non distended, soft and non tender. Normal bowel sounds heard. Central nervous system: Alert and oriented x 3. No focal neurological deficits. Extremities: Edema+, no cyanosis, no clubbing. Skin: Decubitus + No rashes, lesions or ulcers Psychiatry: Judgement and insight appear normal. Mood & affect appropriate.     Data Reviewed: I have personally reviewed following labs and imaging studies  CBC: Recent Labs  Lab 09/29/23 0656  WBC 9.9  HGB 12.8*  HCT 38.7*  MCV 88.6  PLT 294   Basic Metabolic Panel: Recent Labs  Lab 09/26/23 0622 09/27/23 0623 09/28/23 0753 09/29/23 0656 09/30/23 0655 10/01/23 0639 10/01/23 0642  NA 136  --   --  137  --  137  --   K 4.1  --   --  3.9  --  4.4  --   CL 102  --   --  102  --  106  --   CO2 25  --   --  24  --  17*  --   GLUCOSE 127*  --   --  100*  --  105*  --   BUN 16  --   --  15  --  18  --   CREATININE 1.17  --   --  0.80  --  0.86  --   CALCIUM 9.1  --   --  9.3  --  9.1  --   MG 1.6* 1.6* 1.5* 1.5* 1.4*  --  1.5*  PHOS  --   --   --  3.3  --   --   --    GFR: Estimated Creatinine Clearance: 100.7 mL/min (by C-G formula based on SCr of 0.86 mg/dL). Liver Function Tests: No results for input(s): "AST", "ALT", "ALKPHOS", "BILITOT", "PROT", "ALBUMIN" in the last 168 hours.  No results for input(s): "LIPASE", "AMYLASE" in the last 168 hours. No results for input(s): "AMMONIA" in the last 168 hours. Coagulation Profile: No results for input(s): "INR", "PROTIME" in the last 168 hours. Cardiac Enzymes: No results for input(s): "CKTOTAL", "CKMB", "CKMBINDEX", "TROPONINI" in the last 168 hours. BNP (last 3 results) No results for input(s): "PROBNP" in the last 8760 hours. HbA1C: No results for input(s): "HGBA1C" in the last 72 hours. CBG: Recent Labs  Lab 10/01/23 1150 10/01/23 1613 10/01/23 1957 10/02/23 0735 10/02/23 1140  GLUCAP 175* 132* 163* 133*  150*   Lipid Profile: No results for input(s): "CHOL", "HDL", "LDLCALC", "TRIG", "CHOLHDL", "LDLDIRECT" in the last 72 hours. Thyroid Function Tests: No results for input(s): "TSH", "T4TOTAL", "FREET4", "T3FREE", "THYROIDAB" in the last 72 hours. Anemia Panel: No results for input(s): "VITAMINB12", "FOLATE", "FERRITIN", "TIBC", "IRON", "RETICCTPCT" in the last 72 hours. Sepsis Labs: No results for input(s): "PROCALCITON", "LATICACIDVEN" in the last 168 hours.  No results found for this or any previous visit (from the past 240 hours).  Radiology Studies: No results found.  Scheduled Meds:  aspirin EC  81 mg Oral Daily   atorvastatin  80 mg Oral Daily   buPROPion  300 mg  Oral Daily   carvedilol  6.25 mg Oral BID WC   Chlorhexidine Gluconate Cloth  6 each Topical Daily   cyanocobalamin  1,000 mcg Oral Daily   DULoxetine  60 mg Oral QPM   Gerhardt's butt cream   Topical BID   heparin  5,000 Units Subcutaneous Q8H   insulin aspart  0-15 Units Subcutaneous TID WC   insulin aspart  0-5 Units Subcutaneous QHS   leptospermum manuka honey  1 Application Topical Daily   lidocaine  1 patch Transdermal Q24H   Continuous Infusions:   LOS: 8 days    Time spent: 35 mins    Willeen Niece, MD [facility]   If 7PM-7AM, please contact night-coverage

## 2023-10-02 NOTE — TOC Progression Note (Signed)
Transition of Care [facility]) - Progression Note    Patient Details  Name: Devin Combs MRN: [id number] Date of Birth: [birth year]  Transition [facility] CM/SW Contact  Norlene Lanes A [place], LCSW Phone Number: 10/02/2023, 10:07 AM  Clinical Narrative:     CSW sent updated PT and MD progress notes  as requested by insurance to [facility] portal for [facility] as well as faxed to [phone].   Insurance appeal status still pending.     TOC will continue to follow.   Expected Discharge Plan: Skilled Nursing Facility Barriers to Discharge: Continued Medical Work up, English as a second language teacher  Expected Discharge Plan and Services                                               Social Determinants of Health (SDOH) Interventions SDOH Screenings   Food Insecurity: No Food Insecurity (09/22/2023)  Housing: Low Risk  (09/22/2023)  Transportation Needs: Unmet Transportation Needs (09/22/2023)  Utilities: Not At Risk (09/22/2023)  Social Connections: Socially Isolated (09/22/2023)  Tobacco Use: Low Risk  (09/22/2023)    Readmission Risk Interventions     No data to display

## 2023-10-03 DIAGNOSIS — E876 Hypokalemia: Secondary | ICD-10-CM | POA: Diagnosis not present

## 2023-10-03 LAB — MAGNESIUM: Magnesium: 1.3 mg/dL — ABNORMAL LOW (ref 1.7–2.4)

## 2023-10-03 LAB — CBC
HCT: 38 % — ABNORMAL LOW (ref 39.0–52.0)
Hemoglobin: 12.6 g/dL — ABNORMAL LOW (ref 13.0–17.0)
MCH: 29.4 pg (ref 26.0–34.0)
MCHC: 33.2 g/dL (ref 30.0–36.0)
MCV: 88.8 fL (ref 80.0–100.0)
Platelets: 255 10*3/uL (ref 150–400)
RBC: 4.28 MIL/uL (ref 4.22–5.81)
RDW: 14.5 % (ref 11.5–15.5)
WBC: 14.7 10*3/uL — ABNORMAL HIGH (ref 4.0–10.5)
nRBC: 0 % (ref 0.0–0.2)

## 2023-10-03 LAB — GLUCOSE, CAPILLARY
Glucose-Capillary: 125 mg/dL — ABNORMAL HIGH (ref 70–99)
Glucose-Capillary: 170 mg/dL — ABNORMAL HIGH (ref 70–99)
Glucose-Capillary: 154 mg/dL — ABNORMAL HIGH (ref 70–99)
Glucose-Capillary: 140 mg/dL — ABNORMAL HIGH (ref 70–99)

## 2023-10-03 LAB — BASIC METABOLIC PANEL
Anion gap: 9 (ref 5–15)
Anion gap: 12 (ref 5–15)
BUN: 18 mg/dL (ref 8–23)
BUN: 19 mg/dL (ref 8–23)
CO2: 23 mmol/L (ref 22–32)
CO2: 24 mmol/L (ref 22–32)
Calcium: 9.3 mg/dL (ref 8.9–10.3)
Calcium: 9 mg/dL (ref 8.9–10.3)
Chloride: 103 mmol/L (ref 98–111)
Chloride: 100 mmol/L (ref 98–111)
Creatinine, Ser: 1.21 mg/dL (ref 0.61–1.24)
Creatinine, Ser: 0.82 mg/dL (ref 0.61–1.24)
GFR, Estimated: >60 mL/min (ref >60)
GFR, Estimated: >60 mL/min (ref >60)
Glucose, Bld: 130 mg/dL — ABNORMAL HIGH (ref 70–99)
Glucose, Bld: 136 mg/dL — ABNORMAL HIGH (ref 70–99)
Potassium: 3.8 mmol/L (ref 3.5–5.1)
Potassium: 4.3 mmol/L (ref 3.5–5.1)
Sodium: 135 mmol/L (ref 135–145)
Sodium: 136 mmol/L (ref 135–145)

## 2023-10-03 LAB — PHOSPHORUS: Phosphorus: 3.2 mg/dL (ref 2.5–4.6)

## 2023-10-03 MED ORDER — MAGNESIUM SULFATE 2 GM/50ML IV SOLN
2.0000 g | Freq: Once | INTRAVENOUS | Status: AC
Start: 2023-10-03 — End: 2023-10-03
  Administered 2023-10-03: 2 g via INTRAVENOUS
  Filled 2023-10-03: qty 50

## 2023-10-03 NOTE — Progress Notes (Signed)
PROGRESS NOTE  Devin Combs  EAV:[id number] DOB: 12-Sep-1950 DOA: 09/21/2023 PCP: Elias Else, MD  Consultants  Brief Narrative: 73 y.o. male with with prior history of autonomic dysfunction, prior history of COVID with long COVID symptoms, history of hypertension, coronary artery disease, chronic back pain with a history of lumbar fusion, bedridden for the last 2 years presented with worsening decubitus ulcers and wife requesting that patient be placed into rehab facility. On presentation, MRI of brain did not show any acute intracranial abnormity. Lumbar spine did not show acute abnormality in the lumbar spine. PT recommended SNF placement. TOC consulted.    Assessment & Plan: Principal Problem:   Hypokalemia Active Problems:   Hypomagnesemia   Decubitus ulcers   Essential hypertension   Hyperlipidemia   DM (diabetes mellitus), type 2 (HCC)   Bedbound   AKI (acute kidney injury) (HCC)    Decubitus ulcers:  POA Decubitus noted on right and left buttocks, left posterior thigh. Continue local care.  Wound care consulted.  No signs of infection. - on honey treatment - WBC elevated today.  HR good.  No other signs of infection.   Hypokalemia: Replaced.  Continue to monitor   Hypomagnesemia Persistently low.  - Will replace    Acute kidney injury: > Resolved. Creatinine on admission 1.43. Baseline creatinine normal. Encourage oral intake. Currently on gentle hydration.   DC IV fluids.  Resumed losartan.   Hypertension: Continue Coreg and losartan.   Hyperlipidemia: Continue Lipitor.   Diabetes mellitus type 2  Continue CBGs with SSI.  Hold metformin.   Bed bound : History of autonomic dysfunction. PT recommending SNF placement.  TOC consulted Peer 2 peer completed, SNF declined.  Family appealed, SNF now accepted   Depression: Continue bupropion and duloxetine.  Outpatient follow-up with PCP   Goals of care: Overall prognosis is guarded to poor.  Palliative  care consulted.  Patient is hopeful for rehab.  SNF accepted patient.     Obesity class I -Outpatient follow-up   Leukocytosis: -Resolved.   Urinary retention  / hematuria : Resolved. Patient has bloody urine following Foley catheterization. Likely due to traumatic urinary catheterization.  UA now clear. Consider Voiding trial.      DVT prophylaxis:  heparin injection 5,000 Units Start: 09/22/23 0600 SCDs Start: 09/22/23 0453  Code Status:   Code Status: Full Code Family Communication: Wife at bedside and discussed with her. Level of care: Med-Surg Status is: Inpatient Remains inpatient appropriate Dispo: Likely able to DC to SNF tomorrow.  Subjective: Patient awake and alert.  No complaints today.  Wife at bedside.  Eating and drinking well.  Objective: Vitals:   10/02/23 1635 10/02/23 2027 10/03/23 0433 10/03/23 0759  BP: 128/75 102/88 (!) 148/73 (!) 142/83  Pulse: 75 81 84 84  Resp: 18 18 18 18   Temp: 97.6 F (36.4 C) 97.7 F (36.5 C) 99 F (37.2 C) 97.8 F (36.6 C)  TempSrc: Oral     SpO2: 97% 96% 96% 94%  Weight:      Height:        Intake/Output Summary (Last 24 hours) at 10/03/2023 1454 Last data filed at 10/03/2023 0905 Gross per 24 hour  Intake --  Output 1475 ml  Net -1475 ml   Filed Weights   09/22/23 0642 10/01/23 0548 10/02/23 0500  Weight: 107.4 kg 111.2 kg 109.4 kg   Body mass index is 31.82 kg/m.  Gen: 73 y.o. male in no apparent distress.  Nontoxic Pulm: Non-labored breathing.  Clear  to auscultation bilaterally.  CV: Regular rate and rhythm. No murmur, rub, or gallop. No JVD GI: Abdomen soft, non-tender, non-distended, with normoactive bowel sounds. No organomegaly or masses felt. Ext: Warm, no deformities, trace bilateral pedal edema Skin: No rashes, lesions  Neuro: Alert and oriented. No focal neurological deficits. Psych: Calm  Judgement and insight appear normal. Mood & affect appropriate.     I have personally reviewed the  following labs and images: CBC: Recent Labs  Lab 09/29/23 0656 10/03/23 0632  WBC 9.9 14.7*  HGB 12.8* 12.6*  HCT 38.7* 38.0*  MCV 88.6 88.8  PLT 294 255   BMP &GFR Recent Labs  Lab 09/28/23 0753 09/29/23 0656 09/30/23 0655 10/01/23 0639 10/01/23 0642 10/03/23 0632  NA  --  137  --  137  --  135  K  --  3.9  --  4.4  --  3.8  CL  --  102  --  106  --  103  CO2  --  24  --  17*  --  23  GLUCOSE  --  100*  --  105*  --  130*  BUN  --  15  --  18  --  18  CREATININE  --  0.80  --  0.86  --  1.21  CALCIUM  --  9.3  --  9.1  --  9.3  MG 1.5* 1.5* 1.4*  --  1.5* 1.3*  PHOS  --  3.3  --   --   --  3.2   Estimated Creatinine Clearance: 71.6 mL/min (by C-G formula based on SCr of 1.21 mg/dL). Liver & Pancreas: No results for input(s): "AST", "ALT", "ALKPHOS", "BILITOT", "PROT", "ALBUMIN" in the last 168 hours. No results for input(s): "LIPASE", "AMYLASE" in the last 168 hours. No results for input(s): "AMMONIA" in the last 168 hours. Diabetic: No results for input(s): "HGBA1C" in the last 72 hours. Recent Labs  Lab 10/02/23 1140 10/02/23 1636 10/02/23 2026 10/03/23 0759 10/03/23 1158  GLUCAP 150* 150* 210* 125* 170*   Cardiac Enzymes: No results for input(s): "CKTOTAL", "CKMB", "CKMBINDEX", "TROPONINI" in the last 168 hours. No results for input(s): "PROBNP" in the last 8760 hours. Coagulation Profile: No results for input(s): "INR", "PROTIME" in the last 168 hours. Thyroid Function Tests: No results for input(s): "TSH", "T4TOTAL", "FREET4", "T3FREE", "THYROIDAB" in the last 72 hours. Lipid Profile: No results for input(s): "CHOL", "HDL", "LDLCALC", "TRIG", "CHOLHDL", "LDLDIRECT" in the last 72 hours. Anemia Panel: No results for input(s): "VITAMINB12", "FOLATE", "FERRITIN", "TIBC", "IRON", "RETICCTPCT" in the last 72 hours. Urine analysis:    Component Value Date/Time   COLORURINE YELLOW 12/24/2021 0340   APPEARANCEUR CLEAR 12/24/2021 0340   LABSPEC 1.026  12/24/2021 0340   PHURINE 5.0 12/24/2021 0340   GLUCOSEU NEGATIVE 12/24/2021 0340   HGBUR NEGATIVE 12/24/2021 0340   BILIRUBINUR NEGATIVE 12/24/2021 0340   KETONESUR NEGATIVE 12/24/2021 0340   PROTEINUR NEGATIVE 12/24/2021 0340   UROBILINOGEN 0.2 09/11/2007 1400   NITRITE NEGATIVE 12/24/2021 0340   LEUKOCYTESUR NEGATIVE 12/24/2021 0340   Sepsis Labs: Invalid input(s): "PROCALCITONIN", "LACTICIDVEN"  Microbiology: No results found for this or any previous visit (from the past 240 hours).  Radiology Studies: No results found.  Scheduled Meds:  aspirin EC  81 mg Oral Daily   atorvastatin  80 mg Oral Daily   buPROPion  300 mg Oral Daily   carvedilol  6.25 mg Oral BID WC   Chlorhexidine Gluconate Cloth  6 each Topical Daily  cyanocobalamin  1,000 mcg Oral Daily   DULoxetine  60 mg Oral QPM   Gerhardt's butt cream   Topical BID   heparin  5,000 Units Subcutaneous Q8H   insulin aspart  0-15 Units Subcutaneous TID WC   insulin aspart  0-5 Units Subcutaneous QHS   leptospermum manuka honey  1 Application Topical Daily   lidocaine  1 patch Transdermal Q24H   Continuous Infusions:   LOS: 9 days   35 minutes with more than 50% spent in reviewing records, counseling patient/family and coordinating care.  Tobey Grim, MD [facility] [email or web address] 10/03/2023, 2:54 PM

## 2023-10-03 NOTE — TOC Progression Note (Addendum)
Transition of Care [facility]) - Progression Note    Patient Details  Name: Devin Combs MRN: [id number] Date of Birth: [birth year]  [facility] CM/SW Contact  Jt Brabec A [place], LCSW Phone Number: 10/03/2023, 9:10 AM  Clinical Narrative:     Update 1611 CSW met with pt and pt's wife at bedside. They were aware of pt's approval with insurance, stated they would prefer DC tomorrow per concern with foley. Provider made aware and agreed to DC for estimated DC tomorrow. [facility] notified.  CSW was provided information from [facility] and [facility] that pt's appeal was approved and denial was overturned. Pt's approval.  Berkley Harvey ID: [id number] Reference ID [id number]  Approval Dates:  10/02/2023-10/06/2023   CSW reached out to [facility] regarding possible DC today. Left vm with Tresa Endo in admissions to return call to CSW. Provider notified.    TOC will continue to follow.  Expected Discharge Plan: Skilled Nursing Facility Barriers to Discharge: Continued Medical Work up, English as a second language teacher  Expected Discharge Plan and Services                                               Social Determinants of Health (SDOH) Interventions SDOH Screenings   Food Insecurity: No Food Insecurity (09/22/2023)  Housing: Low Risk  (09/22/2023)  Transportation Needs: Unmet Transportation Needs (09/22/2023)  Utilities: Not At Risk (09/22/2023)  Social Connections: Socially Isolated (09/22/2023)  Tobacco Use: Low Risk  (09/22/2023)    Readmission Risk Interventions     No data to display

## 2023-10-03 NOTE — Plan of Care (Signed)

## 2023-10-04 ENCOUNTER — Inpatient Hospital Stay (HOSPITAL_COMMUNITY): Payer: Medicare HMO

## 2023-10-04 DIAGNOSIS — E876 Hypokalemia: Secondary | ICD-10-CM | POA: Diagnosis not present

## 2023-10-04 LAB — BASIC METABOLIC PANEL
Anion gap: 11 (ref 5–15)
BUN: 19 mg/dL (ref 8–23)
CO2: 26 mmol/L (ref 22–32)
Calcium: 9.5 mg/dL (ref 8.9–10.3)
Chloride: 101 mmol/L (ref 98–111)
Creatinine, Ser: 1.06 mg/dL (ref 0.61–1.24)
GFR, Estimated: >60 mL/min (ref >60)
Glucose, Bld: 107 mg/dL — ABNORMAL HIGH (ref 70–99)
Potassium: 4.5 mmol/L (ref 3.5–5.1)
Sodium: 138 mmol/L (ref 135–145)

## 2023-10-04 LAB — URINALYSIS, W/ REFLEX TO CULTURE (INFECTION SUSPECTED)
Bilirubin Urine: NEGATIVE
Glucose, UA: NEGATIVE mg/dL
Ketones, ur: NEGATIVE mg/dL
Nitrite: NEGATIVE
Protein, ur: NEGATIVE mg/dL
Specific Gravity, Urine: 1.003 — ABNORMAL LOW (ref 1.005–1.030)
WBC, UA: >50 WBC/hpf (ref 0–5)
pH: 7 (ref 5.0–8.0)

## 2023-10-04 LAB — GLUCOSE, CAPILLARY
Glucose-Capillary: 107 mg/dL — ABNORMAL HIGH (ref 70–99)
Glucose-Capillary: 170 mg/dL — ABNORMAL HIGH (ref 70–99)
Glucose-Capillary: 116 mg/dL — ABNORMAL HIGH (ref 70–99)
Glucose-Capillary: 174 mg/dL — ABNORMAL HIGH (ref 70–99)

## 2023-10-04 LAB — CBC
HCT: 36.7 % — ABNORMAL LOW (ref 39.0–52.0)
Hemoglobin: 12.1 g/dL — ABNORMAL LOW (ref 13.0–17.0)
MCH: 29.3 pg (ref 26.0–34.0)
MCHC: 33 g/dL (ref 30.0–36.0)
MCV: 88.9 fL (ref 80.0–100.0)
Platelets: 258 10*3/uL (ref 150–400)
RBC: 4.13 MIL/uL — ABNORMAL LOW (ref 4.22–5.81)
RDW: 14.5 % (ref 11.5–15.5)
WBC: 13.2 10*3/uL — ABNORMAL HIGH (ref 4.0–10.5)
nRBC: 0 % (ref 0.0–0.2)

## 2023-10-04 LAB — MAGNESIUM: Magnesium: 1.5 mg/dL — ABNORMAL LOW (ref 1.7–2.4)

## 2023-10-04 MED ORDER — MAGNESIUM SULFATE 2 GM/50ML IV SOLN
2.0000 g | Freq: Once | INTRAVENOUS | Status: AC
  Administered 2023-10-04: 2 g via INTRAVENOUS
  Filled 2023-10-04: qty 50

## 2023-10-04 NOTE — Plan of Care (Signed)
VSS, continues on room air. Wound dressings changed as ordered. Safety maintained. Will continue to monitor.   Problem: Education: Goal: Ability to describe self-care measures that may prevent or decrease complications (Diabetes Survival Skills Education) will improve Outcome: Progressing Goal: Individualized Educational Video(s) Outcome: Progressing   Problem: Coping: Goal: Ability to adjust to condition or change in health will improve Outcome: Progressing   Problem: Fluid Volume: Goal: Ability to maintain a balanced intake and output will improve Outcome: Progressing   Problem: Health Behavior/Discharge Planning: Goal: Ability to identify and utilize available resources and services will improve Outcome: Progressing Goal: Ability to manage health-related needs will improve Outcome: Progressing   Problem: Metabolic: Goal: Ability to maintain appropriate glucose levels will improve Outcome: Progressing   Problem: Nutritional: Goal: Maintenance of adequate nutrition will improve Outcome: Progressing Goal: Progress toward achieving an optimal weight will improve Outcome: Progressing   Problem: Skin Integrity: Goal: Risk for impaired skin integrity will decrease Outcome: Progressing   Problem: Tissue Perfusion: Goal: Adequacy of tissue perfusion will improve Outcome: Progressing   Problem: Education: Goal: Knowledge of General Education information will improve Description: Including pain rating scale, medication(s)/side effects and non-pharmacologic comfort measures Outcome: Progressing   Problem: Health Behavior/Discharge Planning: Goal: Ability to manage health-related needs will improve Outcome: Progressing   Problem: Clinical Measurements: Goal: Ability to maintain clinical measurements within normal limits will improve Outcome: Progressing Goal: Will remain free from infection Outcome: Progressing Goal: Diagnostic test results will improve Outcome:  Progressing Goal: Respiratory complications will improve Outcome: Progressing Goal: Cardiovascular complication will be avoided Outcome: Progressing   Problem: Activity: Goal: Risk for activity intolerance will decrease Outcome: Progressing   Problem: Nutrition: Goal: Adequate nutrition will be maintained Outcome: Progressing   Problem: Coping: Goal: Level of anxiety will decrease Outcome: Progressing   Problem: Elimination: Goal: Will not experience complications related to bowel motility Outcome: Progressing Goal: Will not experience complications related to urinary retention Outcome: Progressing   Problem: Pain Managment: Goal: General experience of comfort will improve and/or be controlled Outcome: Progressing   Problem: Safety: Goal: Ability to remain free from injury will improve Outcome: Progressing   Problem: Skin Integrity: Goal: Risk for impaired skin integrity will decrease Outcome: Progressing

## 2023-10-04 NOTE — Progress Notes (Signed)
Physical Therapy Treatment Patient Details Name: Devin Combs MRN: [id number] DOB: 04-15-1951 Today's Date: 10/04/2023   History of Present Illness 73 year old Caucasian male presents 2/7 with complaints of recent fecal and urinary incontinence. Labs showed Hypokalemia, and Hypomagnesemia. He has also developed decubitus ulcers on the right hip, left buttock and left posterior thigh last week. Prior history of autonomic dysfunction, prior history of COVID with long COVID symptoms, history of hypertension, coronary artery disease, chronic back pain with a history of lumbar fusion, bedridden for the last 2 years,    PT Comments  Progressing towards acute functional goals, updated as appropriate based on progress thus far. Able to stand with assist of Huntley Dec + 4 times today. Engaging LE and trunk musculature in standing with isometric contractions, longest duration 45 seconds in standing. Worked on bed mobility, increasing independence, using rail, up to [employer] +2. Reviewed LE exercises. Patient will benefit from continued inpatient follow up therapy, <3 hours/day. Patient will continue to benefit from skilled physical therapy services to further improve independence with functional mobility. .    If plan is discharge home, recommend the following: A lot of help with bathing/dressing/bathroom;Assistance with cooking/housework;Direct supervision/assist for medications management;Direct supervision/assist for financial management;Assist for transportation;Help with stairs or ramp for entrance;Two people to help with walking and/or transfers   Can travel by private vehicle     No  Equipment Recommendations  None recommended by PT    Recommendations for Other Services       Precautions / Restrictions Precautions Precautions: Fall Recall of Precautions/Restrictions: Impaired Precaution/Restrictions Comments: bed sores Restrictions Weight Bearing Restrictions Per Provider Order: No      Mobility  Bed Mobility Overal bed mobility: Needs Assistance Bed Mobility: Rolling, Sidelying to Sit, Sit to Sidelying Rolling: Used rails, Mod assist Sidelying to sit: +2 for physical assistance, Used rails, Mod assist, HOB elevated     Sit to sidelying: +2 for physical assistance, Used rails, Mod assist General bed mobility comments: Mod assist to roll, needs heavy cues for sequencing to grasp rail. Able to do the majority of work to pull LEs off bed but requires some assist. Mod assist +2 for trunk support to rise (+back pain). Mod assist +2 for LE support back into bed and to assist with pivot on bed pad.    Transfers Overall transfer level: Needs assistance Equipment used: Ambulation equipment used Transfers: Sit to/from Stand Sit to Stand: Via lift equipment           General transfer comment: Peter [place] + again today. Tolerated 4 fully upright stands. Max cues for hip extension, knee extension (improved) and scapular retraction to engage musculature. Tolerated longest duration of 45 seconds upright. Transfer via Lift Equipment: Hydrographic surveyor  Ambulation/Gait               General Gait Details: n/a   Comptroller Bed    Modified Rankin (Stroke Patients Only)       Balance Overall balance assessment: Needs assistance Sitting-balance support: Bilateral upper extremity supported, Feet supported Sitting balance-Leahy Scale: Poor Sitting balance - Comments: CGA at EOB, progressed from min assist with initial sit which increased pain during transition but eases eventually.   Standing balance support: Bilateral upper extremity supported, Reliant on assistive device for balance Standing balance-Leahy Scale: Zero Standing balance comment: Huntley Dec + to stand.  Communication Communication Communication: No apparent difficulties Factors Affecting Communication: Hearing impaired   Cognition Arousal: Alert Behavior During Therapy: WFL for tasks assessed/performed   PT - Cognitive impairments: Initiation, Sequencing, Problem solving, Safety/Judgement                         Following commands: Impaired Following commands impaired: Follows multi-step commands inconsistently, Follows multi-step commands with increased time, Follows one step commands with increased time    Cueing Cueing Techniques: Verbal cues, Tactile cues  Exercises General Exercises - Lower Extremity Ankle Circles/Pumps: AROM, Both, Supine, 10 reps Quad Sets: Strengthening, Both, 10 reps, Supine Gluteal Sets: Strengthening, Both, 10 reps, Standing Long Arc Quad: Strengthening, Both, Seated, 10 reps, AAROM Hip ABduction/ADduction: Strengthening, Both, 10 reps, Seated    General Comments General comments (skin integrity, edema, etc.): Max cues to facilitate LE and core engagement when rising and lowering with sara +      Pertinent Vitals/Pain Pain Assessment Pain Assessment: Faces Faces Pain Scale: Hurts little more Pain Location: lower back Pain Descriptors / Indicators: Grimacing, Sore Pain Intervention(s): Limited activity within patient's tolerance, Monitored during session, Repositioned    Home Living                          Prior Function            PT Goals (current goals can now be found in the care plan section) Acute Rehab PT Goals Patient Stated Goal: Be able to get into wheelchair, eventually walk, or golf. PT Goal Formulation: With patient/family Time For Goal Achievement: 10/06/23 Potential to Achieve Goals: Fair Progress towards PT goals: Progressing toward goals    Frequency    Min 1X/week      PT Plan      Co-evaluation              AM-PAC PT "6 Clicks" Mobility   Outcome Measure  Help needed turning from your back to your side while in a flat bed without using bedrails?: A Lot Help needed moving from lying on your back to  sitting on the side of a flat bed without using bedrails?: A Lot Help needed moving to and from a bed to a chair (including a wheelchair)?: Total Help needed standing up from a chair using your arms (e.g., wheelchair or bedside chair)?: Total Help needed to walk in hospital room?: Total Help needed climbing 3-5 steps with a railing? : Total 6 Click Score: 8    End of Session   Activity Tolerance: Patient tolerated treatment well Patient left: in bed;with call bell/phone within reach;with bed alarm set;with family/visitor present;with SCD's reapplied   PT Visit Diagnosis: Muscle weakness (generalized) (M62.81);Difficulty in walking, not elsewhere classified (R26.2);Other symptoms and signs involving the nervous system (R29.898);Pain Pain - part of body:  (back, wounds)     Time: 4098-1191 PT Time Calculation (min) (ACUTE ONLY): 26 min  Charges:    $Therapeutic Exercise: 8-22 mins $Therapeutic Activity: 8-22 mins PT General Charges $$ ACUTE PT VISIT: 1 Visit                     Kathlyn Sacramento, PT, DPT [facility]  Rehabilitation Services Physical Therapist Office: [phone] Website: [email or web address]    Berton Mount 10/04/2023, 3:04 PM

## 2023-10-04 NOTE — Progress Notes (Signed)
PROGRESS NOTE  Devin Combs  KYH:[id number] DOB: Mar 28, 1951 DOA: 09/21/2023 PCP: Elias Else, MD  Consultants  Brief Narrative: 73 y.o. male with with prior history of autonomic dysfunction, prior history of COVID with long COVID symptoms, history of hypertension, coronary artery disease, chronic back pain with a history of lumbar fusion, bedridden for the last 2 years presented with worsening decubitus ulcers and wife requesting that patient be placed into rehab facility. On presentation, MRI of brain did not show any acute intracranial abnormity. Lumbar spine did not show acute abnormality in the lumbar spine. PT recommended SNF placement. TOC consulted.  Moving towards DC, but now has become more confused.    Assessment & Plan: Principal Problem:   Hypokalemia Active Problems:   Hypomagnesemia   Decubitus ulcers   Essential hypertension   Hyperlipidemia   DM (diabetes mellitus), type 2 (HCC)   Bedbound   AKI (acute kidney injury) (HCC)    Decubitus ulcers:  POA Decubitus noted on right and left buttocks, left posterior thigh. Continue local care.  Wound care consulted.   - on honey treatment - WBC elevated today.  HR good.   Leukocytosis: - remains elevated - more confused and groggy today - Checking CXR/UA.  Wife reports more sediment in urine as well as urinary hesitancy. Culture also sent.      Hypokalemia: Replaced.  Continue to monitor   Hypomagnesemia Persistently low.  - replaced ysterday, rechecking    Acute kidney injury: > Resolved. Creatinine on admission 1.43. Baseline creatinine normal. Encourage oral intake. Currently on gentle hydration.   DC IV fluids.  Resumed losartan.   Hypertension: Continue Coreg and losartan.   Hyperlipidemia: Continue Lipitor.   Diabetes mellitus type 2  Continue CBGs with SSI.  Hold metformin.   Bed bound : History of autonomic dysfunction. PT recommending SNF placement.  TOC consulted Peer 2 peer completed, SNF  declined.  Family appealed, SNF now accepted.  Awaiting medical clearance.    Depression: Continue bupropion and duloxetine.  Outpatient follow-up with PCP   Goals of care: Overall prognosis is guarded to poor.   Patient is hopeful for rehab.  SNF accepted patient.     Obesity class I -Outpatient follow-up   Leukocytosis: -Resolved.   Urinary retention  / hematuria : Resolved. Patient has bloody urine following Foley catheterization. Remains with blood in urine, possibly traumatic Foley placement.  Urine culture pending.        DVT prophylaxis:  heparin injection 5,000 Units Start: 09/22/23 0600 SCDs Start: 09/22/23 0453  Code Status:   Code Status: Full Code Family Communication: Wife at bedside and discussed with her. Level of care: Med-Surg Status is: Inpatient Remains inpatient appropriate Dispo: inpt as we await further testing.    Subjective: Patient awake, but confused today.  Wife notes as well.  He himself has no complaints today.  Wife at bedside.  Eating and drinking well.  Objective: Vitals:   10/03/23 1602 10/03/23 2024 10/04/23 0447 10/04/23 0744  BP: (!) 138/53 (!) 142/65 (!) 144/63 137/87  Pulse: 75 71 87 78  Resp: 16 18 18 18   Temp: 98.1 F (36.7 C) 98 F (36.7 C) 98.5 F (36.9 C) 98 F (36.7 C)  TempSrc:      SpO2: 98% 95% 95% 95%  Weight:      Height:        Intake/Output Summary (Last 24 hours) at 10/04/2023 1425 Last data filed at 10/04/2023 1000 Gross per 24 hour  Intake 143.37 ml  Output 2625 ml  Net -2481.63 ml   Filed Weights   09/22/23 0642 10/01/23 0548 10/02/23 0500  Weight: 107.4 kg 111.2 kg 109.4 kg   Body mass index is 31.82 kg/m.  Gen: 73 y.o. male in no apparent distress.  Nontoxic Pulm: Non-labored breathing.  Clear to auscultation bilaterally.  CV: Regular rate and rhythm. No murmur, rub, or gallop. No JVD GI: Abdomen soft, non-tender, non-distended, with normoactive bowel sounds. No organomegaly or masses  felt. Ext: Warm, no deformities, trace bilateral pedal edema Skin: No rashes, lesions  Neuro: Alert.  Oriented to person and place, not date.  Unable to spell "world" backwards. Difficulty remembering what happened yesterday vs today. Otherwise, no focal neurological deficits. Psych: Calm  Judgement and insight appear normal. Mood & affect appropriate.     I have personally reviewed the following labs and images: CBC: Recent Labs  Lab 09/29/23 0656 10/03/23 0632 10/04/23 0621  WBC 9.9 14.7* 13.2*  HGB 12.8* 12.6* 12.1*  HCT 38.7* 38.0* 36.7*  MCV 88.6 88.8 88.9  PLT 294 255 258   BMP &GFR Recent Labs  Lab 09/28/23 0753 09/29/23 0656 09/30/23 0655 10/01/23 0639 10/01/23 0642 10/03/23 0632 10/03/23 1548  NA  --  137  --  137  --  135 136  K  --  3.9  --  4.4  --  3.8 4.3  CL  --  102  --  106  --  103 100  CO2  --  24  --  17*  --  23 24  GLUCOSE  --  100*  --  105*  --  130* 136*  BUN  --  15  --  18  --  18 19  CREATININE  --  0.80  --  0.86  --  1.21 0.82  CALCIUM  --  9.3  --  9.1  --  9.3 9.0  MG 1.5* 1.5* 1.4*  --  1.5* 1.3*  --   PHOS  --  3.3  --   --   --  3.2  --    Estimated Creatinine Clearance: 105.6 mL/min (by C-G formula based on SCr of 0.82 mg/dL). Liver & Pancreas: No results for input(s): "AST", "ALT", "ALKPHOS", "BILITOT", "PROT", "ALBUMIN" in the last 168 hours. No results for input(s): "LIPASE", "AMYLASE" in the last 168 hours. No results for input(s): "AMMONIA" in the last 168 hours. Diabetic: No results for input(s): "HGBA1C" in the last 72 hours. Recent Labs  Lab 10/03/23 1158 10/03/23 1608 10/03/23 2026 10/04/23 0728 10/04/23 1133  GLUCAP 170* 154* 140* 107* 170*   Cardiac Enzymes: No results for input(s): "CKTOTAL", "CKMB", "CKMBINDEX", "TROPONINI" in the last 168 hours. No results for input(s): "PROBNP" in the last 8760 hours. Coagulation Profile: No results for input(s): "INR", "PROTIME" in the last 168 hours. Thyroid Function  Tests: No results for input(s): "TSH", "T4TOTAL", "FREET4", "T3FREE", "THYROIDAB" in the last 72 hours. Lipid Profile: No results for input(s): "CHOL", "HDL", "LDLCALC", "TRIG", "CHOLHDL", "LDLDIRECT" in the last 72 hours. Anemia Panel: No results for input(s): "VITAMINB12", "FOLATE", "FERRITIN", "TIBC", "IRON", "RETICCTPCT" in the last 72 hours. Urine analysis:    Component Value Date/Time   COLORURINE STRAW (A) 10/04/2023 1232   APPEARANCEUR HAZY (A) 10/04/2023 1232   LABSPEC 1.003 (L) 10/04/2023 1232   PHURINE 7.0 10/04/2023 1232   GLUCOSEU NEGATIVE 10/04/2023 1232   HGBUR LARGE (A) 10/04/2023 1232   BILIRUBINUR NEGATIVE 10/04/2023 1232   KETONESUR NEGATIVE 10/04/2023 1232   PROTEINUR  NEGATIVE 10/04/2023 1232   UROBILINOGEN 0.2 09/11/2007 1400   NITRITE NEGATIVE 10/04/2023 1232   LEUKOCYTESUR LARGE (A) 10/04/2023 1232   Sepsis Labs: Invalid input(s): "PROCALCITONIN", "LACTICIDVEN"  Microbiology: No results found for this or any previous visit (from the past 240 hours).  Radiology Studies: No results found.  Scheduled Meds:  aspirin EC  81 mg Oral Daily   atorvastatin  80 mg Oral Daily   buPROPion  300 mg Oral Daily   carvedilol  6.25 mg Oral BID WC   Chlorhexidine Gluconate Cloth  6 each Topical Daily   cyanocobalamin  1,000 mcg Oral Daily   DULoxetine  60 mg Oral QPM   Gerhardt's butt cream   Topical BID   heparin  5,000 Units Subcutaneous Q8H   insulin aspart  0-15 Units Subcutaneous TID WC   insulin aspart  0-5 Units Subcutaneous QHS   leptospermum manuka honey  1 Application Topical Daily   lidocaine  1 patch Transdermal Q24H   Continuous Infusions:   LOS: 10 days   35 minutes with more than 50% spent in reviewing records, counseling patient/family and coordinating care.  Tobey Grim, MD [facility] [email or web address] 10/04/2023, 2:25 PM

## 2023-10-05 ENCOUNTER — Other Ambulatory Visit (HOSPITAL_COMMUNITY): Payer: Self-pay

## 2023-10-05 DIAGNOSIS — B965 Pseudomonas (aeruginosa) (mallei) (pseudomallei) as the cause of diseases classified elsewhere: Secondary | ICD-10-CM | POA: Diagnosis not present

## 2023-10-05 DIAGNOSIS — Z7401 Bed confinement status: Secondary | ICD-10-CM | POA: Diagnosis not present

## 2023-10-05 DIAGNOSIS — E1140 Type 2 diabetes mellitus with diabetic neuropathy, unspecified: Secondary | ICD-10-CM | POA: Diagnosis not present

## 2023-10-05 DIAGNOSIS — E119 Type 2 diabetes mellitus without complications: Secondary | ICD-10-CM | POA: Diagnosis not present

## 2023-10-05 DIAGNOSIS — K5901 Slow transit constipation: Secondary | ICD-10-CM | POA: Diagnosis not present

## 2023-10-05 DIAGNOSIS — M79672 Pain in left foot: Secondary | ICD-10-CM | POA: Diagnosis not present

## 2023-10-05 DIAGNOSIS — R531 Weakness: Secondary | ICD-10-CM | POA: Diagnosis not present

## 2023-10-05 DIAGNOSIS — F32A Depression, unspecified: Secondary | ICD-10-CM | POA: Diagnosis not present

## 2023-10-05 DIAGNOSIS — E1143 Type 2 diabetes mellitus with diabetic autonomic (poly)neuropathy: Secondary | ICD-10-CM | POA: Diagnosis not present

## 2023-10-05 DIAGNOSIS — L8990 Pressure ulcer of unspecified site, unspecified stage: Secondary | ICD-10-CM | POA: Diagnosis not present

## 2023-10-05 DIAGNOSIS — M4802 Spinal stenosis, cervical region: Secondary | ICD-10-CM | POA: Diagnosis not present

## 2023-10-05 DIAGNOSIS — Z7984 Long term (current) use of oral hypoglycemic drugs: Secondary | ICD-10-CM | POA: Diagnosis not present

## 2023-10-05 DIAGNOSIS — I10 Essential (primary) hypertension: Secondary | ICD-10-CM | POA: Diagnosis not present

## 2023-10-05 DIAGNOSIS — N390 Urinary tract infection, site not specified: Secondary | ICD-10-CM | POA: Diagnosis not present

## 2023-10-05 DIAGNOSIS — R52 Pain, unspecified: Secondary | ICD-10-CM | POA: Diagnosis not present

## 2023-10-05 DIAGNOSIS — G8929 Other chronic pain: Secondary | ICD-10-CM | POA: Diagnosis not present

## 2023-10-05 DIAGNOSIS — M79675 Pain in left toe(s): Secondary | ICD-10-CM | POA: Diagnosis not present

## 2023-10-05 DIAGNOSIS — Z794 Long term (current) use of insulin: Secondary | ICD-10-CM | POA: Diagnosis not present

## 2023-10-05 DIAGNOSIS — E876 Hypokalemia: Secondary | ICD-10-CM | POA: Diagnosis not present

## 2023-10-05 DIAGNOSIS — I2510 Atherosclerotic heart disease of native coronary artery without angina pectoris: Secondary | ICD-10-CM | POA: Diagnosis not present

## 2023-10-05 DIAGNOSIS — M25511 Pain in right shoulder: Secondary | ICD-10-CM | POA: Diagnosis not present

## 2023-10-05 DIAGNOSIS — M5135 Other intervertebral disc degeneration, thoracolumbar region: Secondary | ICD-10-CM | POA: Diagnosis not present

## 2023-10-05 LAB — CBC
HCT: 39.1 % (ref 39.0–52.0)
Hemoglobin: 12.7 g/dL — ABNORMAL LOW (ref 13.0–17.0)
MCH: 28.9 pg (ref 26.0–34.0)
MCHC: 32.5 g/dL (ref 30.0–36.0)
MCV: 89.1 fL (ref 80.0–100.0)
Platelets: 293 10*3/uL (ref 150–400)
RBC: 4.39 MIL/uL (ref 4.22–5.81)
RDW: 14.7 % (ref 11.5–15.5)
WBC: 11.1 10*3/uL — ABNORMAL HIGH (ref 4.0–10.5)
nRBC: 0 % (ref 0.0–0.2)

## 2023-10-05 LAB — GLUCOSE, CAPILLARY
Glucose-Capillary: 135 mg/dL — ABNORMAL HIGH (ref 70–99)
Glucose-Capillary: 159 mg/dL — ABNORMAL HIGH (ref 70–99)
Glucose-Capillary: 164 mg/dL — ABNORMAL HIGH (ref 70–99)

## 2023-10-05 LAB — BASIC METABOLIC PANEL
Anion gap: 11 (ref 5–15)
BUN: 20 mg/dL (ref 8–23)
CO2: 25 mmol/L (ref 22–32)
Calcium: 9.4 mg/dL (ref 8.9–10.3)
Chloride: 101 mmol/L (ref 98–111)
Creatinine, Ser: 0.93 mg/dL (ref 0.61–1.24)
GFR, Estimated: >60 mL/min (ref >60)
Glucose, Bld: 127 mg/dL — ABNORMAL HIGH (ref 70–99)
Potassium: 4 mmol/L (ref 3.5–5.1)
Sodium: 137 mmol/L (ref 135–145)

## 2023-10-05 LAB — PSA: Prostatic Specific Antigen: 2.06 ng/mL (ref 0.00–4.00)

## 2023-10-05 LAB — MAGNESIUM: Magnesium: 1.8 mg/dL (ref 1.7–2.4)

## 2023-10-05 MED ORDER — SULFAMETHOXAZOLE-TRIMETHOPRIM 800-160 MG PO TABS
1.0000 | ORAL_TABLET | Freq: Two times a day (BID) | ORAL | 0 refills | Status: DC
  Filled 2023-10-05: qty 14, 7d supply, fill #0

## 2023-10-05 MED ORDER — CIPROFLOXACIN HCL 500 MG PO TABS
500.0000 mg | ORAL_TABLET | Freq: Two times a day (BID) | ORAL | Status: DC
  Administered 2023-10-05: 500 mg via ORAL
  Filled 2023-10-05 (×2): qty 1

## 2023-10-05 MED ORDER — CIPROFLOXACIN HCL 500 MG PO TABS
500.0000 mg | ORAL_TABLET | Freq: Two times a day (BID) | ORAL | 0 refills | Status: DC
  Filled 2023-10-05: qty 14, 7d supply, fill #0

## 2023-10-05 MED ORDER — CIPROFLOXACIN HCL 500 MG PO TABS: 500.0000 mg | ORAL_TABLET | Freq: Two times a day (BID) | ORAL | 0 refills | Status: AC

## 2023-10-05 MED ORDER — SULFAMETHOXAZOLE-TRIMETHOPRIM 800-160 MG PO TABS: 1.0000 | ORAL_TABLET | Freq: Two times a day (BID) | ORAL | Status: DC

## 2023-10-05 NOTE — Plan of Care (Signed)

## 2023-10-05 NOTE — TOC Transition Note (Signed)
Transition of Care [facility]) - Discharge Note   Patient Details  Name: Devin Combs MRN: [id number] Date of Birth: [birth year]  [facility] of [facility]) CM/SW Contact:  Deontaye Civello A [place], LCSW Phone Number: 10/05/2023, 4:49 PM   Clinical Narrative:     Patient will DC to: [facility]  Anticipated DC date: 10/05/23  Family notified: Tanna Savoy  Transport by: Sharin Mons    Per MD patient ready for DC to [facility] . RN, patient, patient's family, and facility notified of DC. Discharge Summary and FL2 sent to facility. RN to call report prior to discharge ( [phone]). CSW reached out to and left voicemail with DSS social worker, [phone], Rulon Sera, who was following pt in the community to inform them of pt's discharge.   DC packet on chart. Ambulance transport requested for patient.   Berkley Harvey ID: [id number] Reference ID [id number]   Approval Dates:  10/02/2023-10/06/2023    CSW will sign off for now as social work intervention is no longer needed. Please consult [place] again if new needs arise.   Final next level of care: Skilled Nursing Facility Barriers to Discharge: Barriers Resolved   Patient Goals and CMS Choice            Discharge Placement              Patient chooses bed at: [facility] Patient to be transferred to facility by: [facility] Name of family member notified: Cyncere Sontag Patient and family notified of of transfer: 10/05/23  Discharge Plan and Services Additional resources added to the After Visit Summary for                                       Social Drivers of Health (SDOH) Interventions SDOH Screenings   Food Insecurity: No Food Insecurity (09/22/2023)  Housing: Low Risk  (09/22/2023)  Transportation Needs: Unmet Transportation Needs (09/22/2023)  Utilities: Not At Risk (09/22/2023)  Social Connections: Socially Isolated (09/22/2023)  Tobacco Use: Low Risk  (09/22/2023)      Readmission Risk Interventions     No data to display

## 2023-10-05 NOTE — Progress Notes (Addendum)
Foley removed at 1009, pt educated and waiting for pt to void  1615: pt was incontinent of urine(large amt noted). Post void done 24ml.  Report given to [facility] Nurse

## 2023-10-05 NOTE — Discharge Summary (Addendum)
Physician Discharge Summary   Patient: Devin Combs MRN: [id number] DOB: 1950/10/14  Admit date:     09/21/2023  Discharge date: 10/05/23  Discharge Physician: Tobey Grim   PCP: Elias Else, MD   Recommendations at discharge:   Patient had increased white count and some confusion day prior to discharge.  UA concerning for UTI and urine culture eventually grew greater than 100,000 colonies of Pseudomonas.  Patient started on Cipro 500 mg p.o. twice daily for total of 7 days prior to discharge. Ensure resolution of urinary symptoms.  Patient with blood in UA which could also have just been traumatic Foley placement, but also growing Pseudomonas prior to discharge. Low threshold to replace Foley.  Voiding trial here prior to discharge. Continue good decubitus ulcer wound care.  This was initial reason for his hospitalization. Continue with strength training with eye towards getting patient to move back home.  Discharge Diagnoses: Principal Problem:   Hypokalemia Active Problems:   Hypomagnesemia   Decubitus ulcers   Essential hypertension   Hyperlipidemia   DM (diabetes mellitus), type 2 (HCC)   Bedbound   AKI (acute kidney injury) (HCC)  Resolved Problems:   * No resolved hospital problems. *  Hospital Course: 73 y.o. male with with prior history of autonomic dysfunction, prior history of COVID with long COVID symptoms, history of hypertension, coronary artery disease, chronic back pain with a history of lumbar fusion, bedridden for the last 2 years presented with worsening decubitus ulcers and wife requesting that patient be placed into rehab facility. On presentation, MRI of brain did not show any acute intracranial abnormity. Lumbar spine did not show acute abnormality in the lumbar spine.  Patient continued with wound therapy here and showed good improvement.  2 days prior to discharge she became little bit more confused.  Confusion persisted the day prior to his  discharge.  White count elevated.  Chest x-ray obtained and negative.  UA also obtained and showed persistent blood and leukocytes.  Culture grew greater than 100,000 colonies of Pseudomonas day of discharge.  Started on Cipro.  Fortunately confusion had completely resolved day of discharge.   Assessment & Plan:   Decubitus ulcers:  POA Decubitus noted on right and left buttocks, left posterior thigh. Continue local care.  Wound care consulted.   - on honey treatment.  Continue wound care discharge.  Pseudomonas UTI: -Confusion and elevated white count 2 days prior to discharge led to chest x-ray and UA. -UA with blood and leukocytes. -Day of discharge urine culture grew greater than 100,000 colonies of Pseudomonas.  Patient started on Cipro 500 mg p.o. twice daily for total of 7 days.  To be continued at discharge.   Hypokalemia: Replaced.  Continue to monitor.  At goal day of discharge.   Hypomagnesemia -Replaced and normal on day of discharge.   Acute kidney injury: > Resolved. Creatinine on admission 1.43. Baseline creatinine normal. Encourage oral intake. Currently on gentle hydration.   Resumed losartan.   Hypertension: Continue Coreg and losartan.   Hyperlipidemia: Continue Lipitor.   Diabetes mellitus type 2  Continue CBGs with SSI.  Hold metformin.   Bed bound : History of autonomic dysfunction. PT recommending SNF placement.  TOC consulted Peer 2 peer completed, SNF declined.  Family appealed, SNF now accepted.  Now medically cleared.   Depression: Continue bupropion and duloxetine.  Outpatient follow-up with PCP   Goals of care: Overall prognosis is guarded to poor.   Wife and patient are hopeful for  strength through rehab.   Obesity class I -Outpatient follow-up   Leukocytosis: -Resolved.   Right buttock/hip ulcer Left posterior thigh Right buttock ulcer                Disposition: Skilled nursing facility Diet recommendation:  Discharge Diet  Orders (From admission, onward)     Start     Ordered   10/05/23 0000  Diet - low sodium heart healthy        10/05/23 1404           Regular diet DISCHARGE MEDICATION: Allergies as of 10/05/2023   No Known Allergies      Medication List     TAKE these medications    acetaminophen 500 MG tablet Commonly known as: TYLENOL Take 1,000 mg by mouth every 6 (six) hours as needed for mild pain.   Apoaequorin 10 MG Caps Take 10 mg by mouth daily.   aspirin EC 81 MG tablet Take 1 tablet (81 mg total) by mouth daily.   atorvastatin 80 MG tablet Commonly known as: LIPITOR TAKE 1 TABLET BY MOUTH EVERY DAY AT 6PM What changed: See the new instructions.   buPROPion 300 MG 24 hr tablet Commonly known as: WELLBUTRIN XL Take 300 mg by mouth daily.   carvedilol 6.25 MG tablet Commonly known as: COREG TAKE 1 TABLET BY MOUTH 2 TIMES DAILY WITH A MEAL.   ciprofloxacin 500 MG tablet Commonly known as: CIPRO Take 1 tablet (500 mg total) by mouth 2 (two) times daily for 7 days.   DULoxetine 60 MG capsule Commonly known as: CYMBALTA Take 60 mg by mouth every evening.   gabapentin 600 MG tablet Commonly known as: NEURONTIN Take 600 mg by mouth daily as needed (pain).   losartan 100 MG tablet Commonly known as: COZAAR Take 100 mg by mouth daily.   metFORMIN 1000 MG tablet Commonly known as: GLUCOPHAGE Take 1,000 mg by mouth 2 (two) times daily with a meal.   nitroGLYCERIN 0.4 MG SL tablet Commonly known as: NITROSTAT Place 1 tablet (0.4 mg total) under the tongue every 5 (five) minutes x 3 doses as needed for chest pain.   QC Vitamin D3 50 MCG (2000 UT) Tabs Generic drug: Cholecalciferol Take 2,000 Units by mouth daily.   SEMAGLUTIDE(0.25 OR 0.5MG /DOS) Raymond Inject 0.25 mg into the skin every 7 (seven) days. On Sundays   Vitamin B-12 1000 MCG Subl Place 1,000 mcg under the tongue daily.               Discharge Care Instructions  (From admission, onward)            Start     Ordered   10/05/23 0000  Discharge wound care:       Comments: Cleanse buttocks wounds, R hip and L posterior thigh wounds with NS, apply Medihoney to wound bed daily, cover with dry gauze and silicone foam or ABD pad to secure whichever is preferred.   10/05/23 1404            Contact information for follow-up providers     Go to  [facility] Emergency Department at [facility].   Specialty: Emergency Medicine Why: If symptoms worsen Contact information: [address] [place] [address] [phone]             Contact information for after-discharge care     Destination     [facility] .   Service: Skilled Nursing  Contact information: [address] [place] [address] [phone]                    Discharge Exam: Ceasar Mons Weights   09/22/23 9147 10/01/23 0548 10/02/23 0500  Weight: 107.4 kg 111.2 kg 109.4 kg   Gen: 73 y.o. male in no apparent distress.  Nontoxic.  Lying in bed and chronically ill-appearing.  Obese Pulm: Non-labored breathing.  Clear to auscultation bilaterally.  CV: Regular rate and rhythm. No murmur, rub, or gallop. No JVD GI: Abdomen soft, non-tender, non-distended, with normoactive bowel sounds. No organomegaly or masses felt. Ext: Warm, no deformities, trace bilateral pedal edema Skin: No rashes, lesions  Neuro: Alert.  Fully oriented today.  With a notable weakness bilateral upper and lower extremities, which has been his baseline for the past 2 years. Psych: Calm  Judgement and insight appear normal. Mood & affect appropriate.   Condition at discharge: fair  The results of significant diagnostics from this hospitalization (including imaging, microbiology, ancillary and laboratory) are listed below for reference.   Imaging Studies: DG Chest Port 1 View Result Date: 10/04/2023 CLINICAL DATA:  Cough.  CHF. EXAM: PORTABLE CHEST 1  VIEW COMPARISON:  12/23/2021 FINDINGS: Limited exam secondary to AP portable technique and patient size. Moderate right hemidiaphragm elevation. The Chin overlies the apices. Patient rotated to the right. Borderline cardiomegaly. Possible trace left pleural fluid. No pneumothorax. No congestive failure. Mild right base volume loss. No lobar consolidation. IMPRESSION: Low lung volumes, without acute disease. Decreased sensitivity and specificity exam due to technique related factors, as described above. Possible trace left pleural fluid. Electronically Signed   By: Jeronimo Greaves M.D.   On: 10/04/2023 15:16   MR BRAIN W WO CONTRAST Result Date: 09/22/2023 CLINICAL DATA:  Initial evaluation for demyelinating disease. No other relevant history provided. EXAM: MRI HEAD WITHOUT AND WITH CONTRAST TECHNIQUE: Multiplanar, multiecho pulse sequences of the brain and surrounding structures were obtained without and with intravenous contrast. CONTRAST:  10mL GADAVIST GADOBUTROL 1 MMOL/ML IV SOLN COMPARISON:  CT from 12/24/2021 and MRI from 09/19/2017. FINDINGS: Brain: Diffuse prominence of the CSF containing spaces compatible with generalized cerebral atrophy. Mild T2/FLAIR hyperintensity involving the periventricular white matter and pons, most characteristic of chronic microvascular ischemic disease, mild for age. Few small remote lacunar infarcts noted about the thalami. No other parenchymal changes to suggest demyelinating disease. No evidence for acute or subacute infarct. No areas of chronic cortical infarction. No acute or chronic intracranial blood products. No mass lesion, midline shift or mass effect. Diffuse ventricular prominence most likely related to global parenchymal volume loss. No hydrocephalus. No extra-axial fluid collection. Pituitary gland suprasellar region within normal limits. No abnormal enhancement. Vascular: Major intracranial vascular flow voids are maintained. Skull and upper cervical spine:  Craniocervical junction within normal limits. Bone marrow signal intensity normal. No acute scalp soft tissue abnormality. Subcentimeter nonenhancing nodular lesion noted at the left parietal scalp, likely benign and of doubtful significance. Sinuses/Orbits: Prior bilateral ocular lens replacement. Paranasal sinuses are largely clear. Moderate right mastoid effusion noted. Negative nasopharynx. Other: None. IMPRESSION: 1. No acute intracranial abnormality. 2. Moderately advanced cerebral atrophy with mild chronic microvascular ischemic disease. Few small remote lacunar infarcts about the thalami. 3. Moderate right mastoid effusion. Electronically Signed   By: Rise Mu M.D.   On: 09/22/2023 01:13   MR Lumbar Spine W Wo Contrast Result Date: 09/22/2023 CLINICAL DATA:  Initial evaluation for epidural abscess versus cauda equina. No other  relevant history is provided. EXAM: MRI LUMBAR SPINE WITHOUT AND WITH CONTRAST TECHNIQUE: Multiplanar and multiecho pulse sequences of the lumbar spine were obtained without and with intravenous contrast. CONTRAST:  10mL GADAVIST GADOBUTROL 1 MMOL/ML IV SOLN COMPARISON:  MRI from 07/20/2017. FINDINGS: Segmentation: Standard. Lowest well-formed disc space labeled the L5-S1 level. Alignment: Chronic 4 mm anterolisthesis of L4 on L5. Trace 2 mm retrolisthesis of L3 on L4. Vertebrae: Postoperative changes from prior PLIF at L4-5. Vertebral body height maintained without acute or chronic fracture. Bone marrow signal intensity within normal limits. No worrisome osseous lesions. No evidence for osteomyelitis discitis or septic arthritis. Conus medullaris and cauda equina: Conus extends to the L1 level. Conus and cauda equina appear normal. No abnormal enhancement or epidural collections. Paraspinal and other soft tissues: Paraspinous soft tissues within normal limits. Subcentimeter simple right renal cyst noted, of doubtful significance, no follow-up imaging recommended. Disc  levels: L1-2: Disc desiccation with mild disc bulge. Superimposed right extraforaminal disc protrusion closely approximates the exiting right L1 nerve root. Mild facet spurring. No spinal stenosis. Foramina remain patent. L2-3: Disc desiccation with mild disc bulge. Mild reactive endplate spurring. No spinal stenosis. Mild to moderate bilateral L2 foraminal narrowing. L3-4: Disc desiccation with mild disc bulge. Reactive endplate spurring. Superimposed small right foraminal to extraforaminal disc protrusion closely approximates the exiting right L3 nerve root. Mild bilateral facet hypertrophy. No spinal stenosis. Mild to moderate bilateral L3 foraminal stenosis. L4-5: Prior PLIF. Moderate right greater than left facet hypertrophy with residual mild narrowing of the lateral recesses bilaterally, slightly worse on the right. Central canal remains patent. Mild bilateral L4 foraminal stenosis. L5-S1: Disc desiccation with mild disc bulge. Mild reactive endplate spurring. No spinal stenosis. Foramina remain patent. IMPRESSION: 1. No acute abnormality within the lumbar spine. 2. Prior PLIF at L4-5. Residual mild bilateral lateral recess stenosis and mild bilateral L4 foraminal narrowing, largely due to facet disease. 3. Right foraminal to extraforaminal disc protrusions at L1-2 and L3-4, potentially affecting the exiting right L1 and L3 nerve roots respectively. 4. Mild to moderate bilateral L2 and L3 foraminal stenosis related to disc bulge and facet hypertrophy. Electronically Signed   By: Rise Mu M.D.   On: 09/22/2023 00:52    Microbiology: Results for orders placed or performed during the hospital encounter of 09/21/23  Resp panel by RT-PCR (RSV, Flu A&B, Covid) Anterior Nasal Swab     Status: None   Collection Time: 09/21/23  8:16 PM   Specimen: Anterior Nasal Swab  Result Value Ref Range Status   SARS Coronavirus 2 by RT PCR NEGATIVE NEGATIVE Final    Comment: (NOTE) SARS-CoV-2 target nucleic  acids are NOT DETECTED.  The SARS-CoV-2 RNA is generally detectable in upper respiratory specimens during the acute phase of infection. The lowest concentration of SARS-CoV-2 viral copies this assay can detect is 138 copies/mL. A negative result does not preclude SARS-Cov-2 infection and should not be used as the sole basis for treatment or other patient management decisions. A negative result may occur with  improper specimen collection/handling, submission of specimen other than nasopharyngeal swab, presence of viral mutation(s) within the areas targeted by this assay, and inadequate number of viral copies(<138 copies/mL). A negative result must be combined with clinical observations, patient history, and epidemiological information. The expected result is Negative.  Fact Sheet for Patients:  BloggerCourse.com  Fact Sheet for Healthcare Providers:  SeriousBroker.it  This test is no t yet approved or cleared by the [place] and  has been authorized for detection and/or diagnosis of SARS-CoV-2 by FDA under an Emergency Use Authorization (EUA). This EUA will remain  in effect (meaning this test can be used) for the duration of the COVID-19 declaration under Section 564(b)(1) of the Act, 21 U.S.C.section 360bbb-3(b)(1), unless the authorization is terminated  or revoked sooner.       Influenza A by PCR NEGATIVE NEGATIVE Final   Influenza B by PCR NEGATIVE NEGATIVE Final    Comment: (NOTE) The Xpert Xpress SARS-CoV-2/FLU/RSV plus assay is intended as an aid in the diagnosis of influenza from Nasopharyngeal swab specimens and should not be used as a sole basis for treatment. Nasal washings and aspirates are unacceptable for Xpert Xpress SARS-CoV-2/FLU/RSV testing.  Fact Sheet for Patients: BloggerCourse.com  Fact Sheet for Healthcare Providers: SeriousBroker.it  This  test is not yet approved or cleared by the [place] FDA and has been authorized for detection and/or diagnosis of SARS-CoV-2 by FDA under an Emergency Use Authorization (EUA). This EUA will remain in effect (meaning this test can be used) for the duration of the COVID-19 declaration under Section 564(b)(1) of the Act, 21 U.S.C. section 360bbb-3(b)(1), unless the authorization is terminated or revoked.     Resp Syncytial Virus by PCR NEGATIVE NEGATIVE Final    Comment: (NOTE) Fact Sheet for Patients: BloggerCourse.com  Fact Sheet for Healthcare Providers: SeriousBroker.it  This test is not yet approved or cleared by the [place] FDA and has been authorized for detection and/or diagnosis of SARS-CoV-2 by FDA under an Emergency Use Authorization (EUA). This EUA will remain in effect (meaning this test can be used) for the duration of the COVID-19 declaration under Section 564(b)(1) of the Act, 21 U.S.C. section 360bbb-3(b)(1), unless the authorization is terminated or revoked.  Performed at [facility], [address] [place], [place] [address]   Urine Culture     Status: Abnormal (Preliminary result)   Collection Time: 10/04/23 12:32 PM   Specimen: Urine, Random  Result Value Ref Range Status   Specimen Description URINE, RANDOM  Final   Special Requests NONE Reflexed from [id number]  Final   Culture (A)  Final    >=100,000 COLONIES/mL PSEUDOMONAS AERUGINOSA SUSCEPTIBILITIES TO FOLLOW Performed at [facility], [address] [place], [place] [address]    Report Status PENDING  Incomplete    Labs: CBC: Recent Labs  Lab 09/29/23 0656 10/03/23 0632 10/04/23 0621 10/05/23 0612  WBC 9.9 14.7* 13.2* 11.1*  HGB 12.8* 12.6* 12.1* 12.7*  HCT 38.7* 38.0* 36.7* 39.1  MCV 88.6 88.8 88.9 89.1  PLT 294 255 258 293   Basic Metabolic Panel: Recent Labs  Lab 09/29/23 0656 09/30/23 0655  10/01/23 0639 10/01/23 0642 10/03/23 0632 10/03/23 1548 10/04/23 1510 10/05/23 0612  NA 137  --  137  --  135 136 138 137  K 3.9  --  4.4  --  3.8 4.3 4.5 4.0  CL 102  --  106  --  103 100 101 101  CO2 24  --  17*  --  23 24 26 25   GLUCOSE 100*  --  105*  --  130* 136* 107* 127*  BUN 15  --  18  --  18 19 19 20   CREATININE 0.80  --  0.86  --  1.21 0.82 1.06 0.93  CALCIUM 9.3  --  9.1  --  9.3 9.0 9.5 9.4  MG 1.5* 1.4*  --  1.5* 1.3*  --  1.5*  1.8  PHOS 3.3  --   --   --  3.2  --   --   --    Liver Function Tests: No results for input(s): "AST", "ALT", "ALKPHOS", "BILITOT", "PROT", "ALBUMIN" in the last 168 hours. CBG: Recent Labs  Lab 10/04/23 1546 10/04/23 2127 10/05/23 0738 10/05/23 1117 10/05/23 1513  GLUCAP 116* 174* 135* 159* 164*    Discharge time spent: less than 30 minutes.  Signed: Tobey Grim, MD [facility] 10/05/2023

## 2023-10-07 ENCOUNTER — Other Ambulatory Visit: Payer: Self-pay | Admitting: Family Medicine

## 2023-10-07 ENCOUNTER — Telehealth: Payer: Self-pay | Admitting: Family Medicine

## 2023-10-07 LAB — URINE CULTURE: Culture: >=100000 — AB

## 2023-10-07 MED ORDER — AMOXICILLIN 500 MG PO CAPS
500.0000 mg | ORAL_CAPSULE | Freq: Three times a day (TID) | ORAL | 0 refills | Status: AC
Start: 2023-10-07 — End: 2023-10-14

## 2023-10-07 NOTE — Telephone Encounter (Signed)
 After discharge phone call:  After patient was discharged his urine culture came back positive for Enterococcus.  We knew about the positive Pseudomonas for which she was discharged on ciprofloxacin.  However the Enterococcus came back positive after the patient had already left.  Susceptibilities have come back susceptible to amoxicillin.  He has no allergies to this per his wife.  Will send in 500mg  amoxicillin p.o. 3 daily for the next 7 days to fully treat.  Also told her to update the skilled rehab facility.  Med prescribed through "orders only encounter.".  Wife very appreciative of call.

## 2023-10-07 NOTE — Progress Notes (Signed)
 After discharge phone call:  After patient was discharged his urine culture came back positive for Enterococcus.  We knew about the positive Pseudomonas for which he was discharged on ciprofloxacin.  However the Enterococcus came back positive after he had a already left.  Susceptibilities have come back susceptible to amoxicillin.  He has no allergies to this per his wife.  Will send in 500 mg amoxicillin p.o. 3 times daily for the next 7 days to fully treat.  Also told her to update the skilled rehab facility.  Med prescribed through Hospital encounter.  Wife very appreciative of call.

## 2023-10-08 DIAGNOSIS — G8929 Other chronic pain: Secondary | ICD-10-CM | POA: Diagnosis not present

## 2023-10-08 DIAGNOSIS — E876 Hypokalemia: Secondary | ICD-10-CM | POA: Diagnosis not present

## 2023-10-08 DIAGNOSIS — I10 Essential (primary) hypertension: Secondary | ICD-10-CM | POA: Diagnosis not present

## 2023-10-08 DIAGNOSIS — L8990 Pressure ulcer of unspecified site, unspecified stage: Secondary | ICD-10-CM | POA: Diagnosis not present

## 2023-10-08 DIAGNOSIS — I2510 Atherosclerotic heart disease of native coronary artery without angina pectoris: Secondary | ICD-10-CM | POA: Diagnosis not present

## 2023-10-08 DIAGNOSIS — Z794 Long term (current) use of insulin: Secondary | ICD-10-CM | POA: Diagnosis not present

## 2023-10-08 DIAGNOSIS — N390 Urinary tract infection, site not specified: Secondary | ICD-10-CM | POA: Diagnosis not present

## 2023-10-08 DIAGNOSIS — B965 Pseudomonas (aeruginosa) (mallei) (pseudomallei) as the cause of diseases classified elsewhere: Secondary | ICD-10-CM | POA: Diagnosis not present

## 2023-10-09 DIAGNOSIS — B965 Pseudomonas (aeruginosa) (mallei) (pseudomallei) as the cause of diseases classified elsewhere: Secondary | ICD-10-CM | POA: Diagnosis not present

## 2023-10-09 DIAGNOSIS — E1143 Type 2 diabetes mellitus with diabetic autonomic (poly)neuropathy: Secondary | ICD-10-CM | POA: Diagnosis not present

## 2023-10-09 DIAGNOSIS — I2510 Atherosclerotic heart disease of native coronary artery without angina pectoris: Secondary | ICD-10-CM | POA: Diagnosis not present

## 2023-10-09 DIAGNOSIS — G8929 Other chronic pain: Secondary | ICD-10-CM | POA: Diagnosis not present

## 2023-10-09 DIAGNOSIS — N390 Urinary tract infection, site not specified: Secondary | ICD-10-CM | POA: Diagnosis not present

## 2023-10-09 DIAGNOSIS — E876 Hypokalemia: Secondary | ICD-10-CM | POA: Diagnosis not present

## 2023-10-09 DIAGNOSIS — I10 Essential (primary) hypertension: Secondary | ICD-10-CM | POA: Diagnosis not present

## 2023-10-09 DIAGNOSIS — M25511 Pain in right shoulder: Secondary | ICD-10-CM | POA: Diagnosis not present

## 2023-10-16 DIAGNOSIS — M79675 Pain in left toe(s): Secondary | ICD-10-CM | POA: Diagnosis not present

## 2023-10-16 DIAGNOSIS — I10 Essential (primary) hypertension: Secondary | ICD-10-CM | POA: Diagnosis not present

## 2023-10-16 DIAGNOSIS — M5135 Other intervertebral disc degeneration, thoracolumbar region: Secondary | ICD-10-CM | POA: Diagnosis not present

## 2023-10-16 DIAGNOSIS — K5901 Slow transit constipation: Secondary | ICD-10-CM | POA: Diagnosis not present

## 2023-10-16 DIAGNOSIS — Z7984 Long term (current) use of oral hypoglycemic drugs: Secondary | ICD-10-CM | POA: Diagnosis not present

## 2023-10-16 DIAGNOSIS — M25511 Pain in right shoulder: Secondary | ICD-10-CM | POA: Diagnosis not present

## 2023-10-16 DIAGNOSIS — M79672 Pain in left foot: Secondary | ICD-10-CM | POA: Diagnosis not present

## 2023-10-16 DIAGNOSIS — L8990 Pressure ulcer of unspecified site, unspecified stage: Secondary | ICD-10-CM | POA: Diagnosis not present

## 2023-10-16 DIAGNOSIS — E1140 Type 2 diabetes mellitus with diabetic neuropathy, unspecified: Secondary | ICD-10-CM | POA: Diagnosis not present

## 2023-10-22 DIAGNOSIS — N390 Urinary tract infection, site not specified: Secondary | ICD-10-CM | POA: Diagnosis not present

## 2023-10-22 DIAGNOSIS — M6281 Muscle weakness (generalized): Secondary | ICD-10-CM | POA: Diagnosis not present

## 2023-10-23 DIAGNOSIS — I2510 Atherosclerotic heart disease of native coronary artery without angina pectoris: Secondary | ICD-10-CM | POA: Diagnosis not present

## 2023-10-23 DIAGNOSIS — K5901 Slow transit constipation: Secondary | ICD-10-CM | POA: Diagnosis not present

## 2023-10-23 DIAGNOSIS — M5135 Other intervertebral disc degeneration, thoracolumbar region: Secondary | ICD-10-CM | POA: Diagnosis not present

## 2023-10-23 DIAGNOSIS — N390 Urinary tract infection, site not specified: Secondary | ICD-10-CM | POA: Diagnosis not present

## 2023-10-23 DIAGNOSIS — E1143 Type 2 diabetes mellitus with diabetic autonomic (poly)neuropathy: Secondary | ICD-10-CM | POA: Diagnosis not present

## 2023-10-23 DIAGNOSIS — R350 Frequency of micturition: Secondary | ICD-10-CM | POA: Diagnosis not present

## 2023-10-23 DIAGNOSIS — E1140 Type 2 diabetes mellitus with diabetic neuropathy, unspecified: Secondary | ICD-10-CM | POA: Diagnosis not present

## 2023-10-23 DIAGNOSIS — L8990 Pressure ulcer of unspecified site, unspecified stage: Secondary | ICD-10-CM | POA: Diagnosis not present

## 2023-10-23 DIAGNOSIS — I10 Essential (primary) hypertension: Secondary | ICD-10-CM | POA: Diagnosis not present

## 2023-10-23 DIAGNOSIS — Z7984 Long term (current) use of oral hypoglycemic drugs: Secondary | ICD-10-CM | POA: Diagnosis not present

## 2023-10-23 DIAGNOSIS — M6281 Muscle weakness (generalized): Secondary | ICD-10-CM | POA: Diagnosis not present

## 2023-10-24 DIAGNOSIS — M6281 Muscle weakness (generalized): Secondary | ICD-10-CM | POA: Diagnosis not present

## 2023-10-24 DIAGNOSIS — N390 Urinary tract infection, site not specified: Secondary | ICD-10-CM | POA: Diagnosis not present

## 2023-10-25 DIAGNOSIS — N390 Urinary tract infection, site not specified: Secondary | ICD-10-CM | POA: Diagnosis not present

## 2023-10-25 DIAGNOSIS — M6281 Muscle weakness (generalized): Secondary | ICD-10-CM | POA: Diagnosis not present

## 2023-10-26 DIAGNOSIS — M6281 Muscle weakness (generalized): Secondary | ICD-10-CM | POA: Diagnosis not present

## 2023-10-26 DIAGNOSIS — N390 Urinary tract infection, site not specified: Secondary | ICD-10-CM | POA: Diagnosis not present

## 2023-10-29 DIAGNOSIS — M6281 Muscle weakness (generalized): Secondary | ICD-10-CM | POA: Diagnosis not present

## 2023-10-29 DIAGNOSIS — N390 Urinary tract infection, site not specified: Secondary | ICD-10-CM | POA: Diagnosis not present

## 2023-10-30 DIAGNOSIS — N390 Urinary tract infection, site not specified: Secondary | ICD-10-CM | POA: Diagnosis not present

## 2023-10-30 DIAGNOSIS — M6281 Muscle weakness (generalized): Secondary | ICD-10-CM | POA: Diagnosis not present

## 2023-10-31 DIAGNOSIS — N390 Urinary tract infection, site not specified: Secondary | ICD-10-CM | POA: Diagnosis not present

## 2023-10-31 DIAGNOSIS — M6281 Muscle weakness (generalized): Secondary | ICD-10-CM | POA: Diagnosis not present

## 2023-11-01 DIAGNOSIS — M6281 Muscle weakness (generalized): Secondary | ICD-10-CM | POA: Diagnosis not present

## 2023-11-01 DIAGNOSIS — N390 Urinary tract infection, site not specified: Secondary | ICD-10-CM | POA: Diagnosis not present

## 2023-11-02 DIAGNOSIS — N390 Urinary tract infection, site not specified: Secondary | ICD-10-CM | POA: Diagnosis not present

## 2023-11-02 DIAGNOSIS — M6281 Muscle weakness (generalized): Secondary | ICD-10-CM | POA: Diagnosis not present

## 2023-11-03 DIAGNOSIS — N390 Urinary tract infection, site not specified: Secondary | ICD-10-CM | POA: Diagnosis not present

## 2023-11-03 DIAGNOSIS — M6281 Muscle weakness (generalized): Secondary | ICD-10-CM | POA: Diagnosis not present

## 2023-11-05 DIAGNOSIS — M6281 Muscle weakness (generalized): Secondary | ICD-10-CM | POA: Diagnosis not present

## 2023-11-05 DIAGNOSIS — N390 Urinary tract infection, site not specified: Secondary | ICD-10-CM | POA: Diagnosis not present

## 2023-11-06 DIAGNOSIS — N390 Urinary tract infection, site not specified: Secondary | ICD-10-CM | POA: Diagnosis not present

## 2023-11-06 DIAGNOSIS — M6281 Muscle weakness (generalized): Secondary | ICD-10-CM | POA: Diagnosis not present

## 2023-11-07 DIAGNOSIS — N390 Urinary tract infection, site not specified: Secondary | ICD-10-CM | POA: Diagnosis not present

## 2023-11-07 DIAGNOSIS — M6281 Muscle weakness (generalized): Secondary | ICD-10-CM | POA: Diagnosis not present

## 2023-11-08 DIAGNOSIS — N390 Urinary tract infection, site not specified: Secondary | ICD-10-CM | POA: Diagnosis not present

## 2023-11-08 DIAGNOSIS — M6281 Muscle weakness (generalized): Secondary | ICD-10-CM | POA: Diagnosis not present

## 2023-11-09 DIAGNOSIS — N390 Urinary tract infection, site not specified: Secondary | ICD-10-CM | POA: Diagnosis not present

## 2023-11-09 DIAGNOSIS — M6281 Muscle weakness (generalized): Secondary | ICD-10-CM | POA: Diagnosis not present

## 2023-11-10 DIAGNOSIS — N390 Urinary tract infection, site not specified: Secondary | ICD-10-CM | POA: Diagnosis not present

## 2023-11-10 DIAGNOSIS — M6281 Muscle weakness (generalized): Secondary | ICD-10-CM | POA: Diagnosis not present

## 2023-11-11 DIAGNOSIS — N390 Urinary tract infection, site not specified: Secondary | ICD-10-CM | POA: Diagnosis not present

## 2023-11-11 DIAGNOSIS — M6281 Muscle weakness (generalized): Secondary | ICD-10-CM | POA: Diagnosis not present

## 2023-11-12 DIAGNOSIS — N390 Urinary tract infection, site not specified: Secondary | ICD-10-CM | POA: Diagnosis not present

## 2023-11-12 DIAGNOSIS — E1140 Type 2 diabetes mellitus with diabetic neuropathy, unspecified: Secondary | ICD-10-CM | POA: Diagnosis not present

## 2023-11-12 DIAGNOSIS — K5901 Slow transit constipation: Secondary | ICD-10-CM | POA: Diagnosis not present

## 2023-11-12 DIAGNOSIS — Z7985 Long-term (current) use of injectable non-insulin antidiabetic drugs: Secondary | ICD-10-CM | POA: Diagnosis not present

## 2023-11-12 DIAGNOSIS — M6281 Muscle weakness (generalized): Secondary | ICD-10-CM | POA: Diagnosis not present

## 2023-11-12 DIAGNOSIS — Z7984 Long term (current) use of oral hypoglycemic drugs: Secondary | ICD-10-CM | POA: Diagnosis not present

## 2023-11-12 DIAGNOSIS — R5383 Other fatigue: Secondary | ICD-10-CM | POA: Diagnosis not present

## 2023-11-13 DIAGNOSIS — N390 Urinary tract infection, site not specified: Secondary | ICD-10-CM | POA: Diagnosis not present

## 2023-11-13 DIAGNOSIS — M6281 Muscle weakness (generalized): Secondary | ICD-10-CM | POA: Diagnosis not present

## 2023-11-14 DIAGNOSIS — M6281 Muscle weakness (generalized): Secondary | ICD-10-CM | POA: Diagnosis not present

## 2023-11-14 DIAGNOSIS — N390 Urinary tract infection, site not specified: Secondary | ICD-10-CM | POA: Diagnosis not present

## 2023-11-15 DIAGNOSIS — M6281 Muscle weakness (generalized): Secondary | ICD-10-CM | POA: Diagnosis not present

## 2023-11-15 DIAGNOSIS — N390 Urinary tract infection, site not specified: Secondary | ICD-10-CM | POA: Diagnosis not present

## 2023-11-16 DIAGNOSIS — M6281 Muscle weakness (generalized): Secondary | ICD-10-CM | POA: Diagnosis not present

## 2023-11-16 DIAGNOSIS — I10 Essential (primary) hypertension: Secondary | ICD-10-CM | POA: Diagnosis not present

## 2023-11-16 DIAGNOSIS — E876 Hypokalemia: Secondary | ICD-10-CM | POA: Diagnosis not present

## 2023-11-16 DIAGNOSIS — I2510 Atherosclerotic heart disease of native coronary artery without angina pectoris: Secondary | ICD-10-CM | POA: Diagnosis not present

## 2023-11-16 DIAGNOSIS — N390 Urinary tract infection, site not specified: Secondary | ICD-10-CM | POA: Diagnosis not present

## 2023-11-16 DIAGNOSIS — E119 Type 2 diabetes mellitus without complications: Secondary | ICD-10-CM | POA: Diagnosis not present

## 2023-11-17 DIAGNOSIS — M6281 Muscle weakness (generalized): Secondary | ICD-10-CM | POA: Diagnosis not present

## 2023-11-17 DIAGNOSIS — N390 Urinary tract infection, site not specified: Secondary | ICD-10-CM | POA: Diagnosis not present

## 2023-11-19 DIAGNOSIS — B961 Klebsiella pneumoniae [K. pneumoniae] as the cause of diseases classified elsewhere: Secondary | ICD-10-CM | POA: Diagnosis not present

## 2023-11-19 DIAGNOSIS — B965 Pseudomonas (aeruginosa) (mallei) (pseudomallei) as the cause of diseases classified elsewhere: Secondary | ICD-10-CM | POA: Diagnosis not present

## 2023-11-19 DIAGNOSIS — M6281 Muscle weakness (generalized): Secondary | ICD-10-CM | POA: Diagnosis not present

## 2023-11-19 DIAGNOSIS — B9689 Other specified bacterial agents as the cause of diseases classified elsewhere: Secondary | ICD-10-CM | POA: Diagnosis not present

## 2023-11-19 DIAGNOSIS — N390 Urinary tract infection, site not specified: Secondary | ICD-10-CM | POA: Diagnosis not present

## 2023-11-19 DIAGNOSIS — I10 Essential (primary) hypertension: Secondary | ICD-10-CM | POA: Diagnosis not present

## 2023-11-20 DIAGNOSIS — M6281 Muscle weakness (generalized): Secondary | ICD-10-CM | POA: Diagnosis not present

## 2023-11-20 DIAGNOSIS — N390 Urinary tract infection, site not specified: Secondary | ICD-10-CM | POA: Diagnosis not present

## 2023-11-21 DIAGNOSIS — M6281 Muscle weakness (generalized): Secondary | ICD-10-CM | POA: Diagnosis not present

## 2023-11-21 DIAGNOSIS — N390 Urinary tract infection, site not specified: Secondary | ICD-10-CM | POA: Diagnosis not present

## 2023-12-04 DIAGNOSIS — N390 Urinary tract infection, site not specified: Secondary | ICD-10-CM | POA: Diagnosis not present

## 2023-12-06 DIAGNOSIS — K5901 Slow transit constipation: Secondary | ICD-10-CM | POA: Diagnosis not present

## 2023-12-06 DIAGNOSIS — Z7982 Long term (current) use of aspirin: Secondary | ICD-10-CM | POA: Diagnosis not present

## 2023-12-06 DIAGNOSIS — I2510 Atherosclerotic heart disease of native coronary artery without angina pectoris: Secondary | ICD-10-CM | POA: Diagnosis not present

## 2023-12-06 DIAGNOSIS — I10 Essential (primary) hypertension: Secondary | ICD-10-CM | POA: Diagnosis not present

## 2023-12-06 DIAGNOSIS — Z7985 Long-term (current) use of injectable non-insulin antidiabetic drugs: Secondary | ICD-10-CM | POA: Diagnosis not present

## 2023-12-06 DIAGNOSIS — E1143 Type 2 diabetes mellitus with diabetic autonomic (poly)neuropathy: Secondary | ICD-10-CM | POA: Diagnosis not present

## 2023-12-06 DIAGNOSIS — M5135 Other intervertebral disc degeneration, thoracolumbar region: Secondary | ICD-10-CM | POA: Diagnosis not present

## 2023-12-06 DIAGNOSIS — L8990 Pressure ulcer of unspecified site, unspecified stage: Secondary | ICD-10-CM | POA: Diagnosis not present

## 2023-12-06 DIAGNOSIS — Z7984 Long term (current) use of oral hypoglycemic drugs: Secondary | ICD-10-CM | POA: Diagnosis not present

## 2023-12-07 DIAGNOSIS — R197 Diarrhea, unspecified: Secondary | ICD-10-CM | POA: Diagnosis not present

## 2023-12-07 DIAGNOSIS — I10 Essential (primary) hypertension: Secondary | ICD-10-CM | POA: Diagnosis not present

## 2023-12-07 DIAGNOSIS — R972 Elevated prostate specific antigen [PSA]: Secondary | ICD-10-CM | POA: Diagnosis not present

## 2023-12-09 DIAGNOSIS — A048 Other specified bacterial intestinal infections: Secondary | ICD-10-CM | POA: Diagnosis not present

## 2023-12-10 DIAGNOSIS — R8271 Bacteriuria: Secondary | ICD-10-CM | POA: Diagnosis not present

## 2023-12-10 DIAGNOSIS — B961 Klebsiella pneumoniae [K. pneumoniae] as the cause of diseases classified elsewhere: Secondary | ICD-10-CM | POA: Diagnosis not present

## 2023-12-10 DIAGNOSIS — N390 Urinary tract infection, site not specified: Secondary | ICD-10-CM | POA: Diagnosis not present

## 2023-12-10 DIAGNOSIS — F552 Abuse of laxatives: Secondary | ICD-10-CM | POA: Diagnosis not present

## 2023-12-10 DIAGNOSIS — R197 Diarrhea, unspecified: Secondary | ICD-10-CM | POA: Diagnosis not present

## 2023-12-10 DIAGNOSIS — Z221 Carrier of other intestinal infectious diseases: Secondary | ICD-10-CM | POA: Diagnosis not present

## 2023-12-13 DIAGNOSIS — M6281 Muscle weakness (generalized): Secondary | ICD-10-CM | POA: Diagnosis not present

## 2023-12-13 DIAGNOSIS — N390 Urinary tract infection, site not specified: Secondary | ICD-10-CM | POA: Diagnosis not present

## 2023-12-14 DIAGNOSIS — M6281 Muscle weakness (generalized): Secondary | ICD-10-CM | POA: Diagnosis not present

## 2023-12-14 DIAGNOSIS — N390 Urinary tract infection, site not specified: Secondary | ICD-10-CM | POA: Diagnosis not present

## 2023-12-15 DIAGNOSIS — N390 Urinary tract infection, site not specified: Secondary | ICD-10-CM | POA: Diagnosis not present

## 2023-12-15 DIAGNOSIS — M6281 Muscle weakness (generalized): Secondary | ICD-10-CM | POA: Diagnosis not present

## 2023-12-17 DIAGNOSIS — N390 Urinary tract infection, site not specified: Secondary | ICD-10-CM | POA: Diagnosis not present

## 2023-12-17 DIAGNOSIS — M6281 Muscle weakness (generalized): Secondary | ICD-10-CM | POA: Diagnosis not present

## 2023-12-18 DIAGNOSIS — M6281 Muscle weakness (generalized): Secondary | ICD-10-CM | POA: Diagnosis not present

## 2023-12-18 DIAGNOSIS — N390 Urinary tract infection, site not specified: Secondary | ICD-10-CM | POA: Diagnosis not present

## 2023-12-18 DIAGNOSIS — B351 Tinea unguium: Secondary | ICD-10-CM | POA: Diagnosis not present

## 2023-12-18 DIAGNOSIS — R197 Diarrhea, unspecified: Secondary | ICD-10-CM | POA: Diagnosis not present

## 2023-12-18 DIAGNOSIS — M79675 Pain in left toe(s): Secondary | ICD-10-CM | POA: Diagnosis not present

## 2023-12-18 DIAGNOSIS — Z221 Carrier of other intestinal infectious diseases: Secondary | ICD-10-CM | POA: Diagnosis not present

## 2023-12-18 DIAGNOSIS — M79674 Pain in right toe(s): Secondary | ICD-10-CM | POA: Diagnosis not present

## 2023-12-19 DIAGNOSIS — M6281 Muscle weakness (generalized): Secondary | ICD-10-CM | POA: Diagnosis not present

## 2023-12-19 DIAGNOSIS — N390 Urinary tract infection, site not specified: Secondary | ICD-10-CM | POA: Diagnosis not present

## 2023-12-20 DIAGNOSIS — N390 Urinary tract infection, site not specified: Secondary | ICD-10-CM | POA: Diagnosis not present

## 2023-12-20 DIAGNOSIS — M6281 Muscle weakness (generalized): Secondary | ICD-10-CM | POA: Diagnosis not present

## 2023-12-21 DIAGNOSIS — M6281 Muscle weakness (generalized): Secondary | ICD-10-CM | POA: Diagnosis not present

## 2023-12-21 DIAGNOSIS — N390 Urinary tract infection, site not specified: Secondary | ICD-10-CM | POA: Diagnosis not present

## 2023-12-24 DIAGNOSIS — R197 Diarrhea, unspecified: Secondary | ICD-10-CM | POA: Diagnosis not present

## 2023-12-24 DIAGNOSIS — N390 Urinary tract infection, site not specified: Secondary | ICD-10-CM | POA: Diagnosis not present

## 2023-12-24 DIAGNOSIS — M6281 Muscle weakness (generalized): Secondary | ICD-10-CM | POA: Diagnosis not present

## 2023-12-25 DIAGNOSIS — M6281 Muscle weakness (generalized): Secondary | ICD-10-CM | POA: Diagnosis not present

## 2023-12-25 DIAGNOSIS — N390 Urinary tract infection, site not specified: Secondary | ICD-10-CM | POA: Diagnosis not present

## 2023-12-26 DIAGNOSIS — N390 Urinary tract infection, site not specified: Secondary | ICD-10-CM | POA: Diagnosis not present

## 2023-12-26 DIAGNOSIS — M6281 Muscle weakness (generalized): Secondary | ICD-10-CM | POA: Diagnosis not present

## 2023-12-27 DIAGNOSIS — A0471 Enterocolitis due to Clostridium difficile, recurrent: Secondary | ICD-10-CM | POA: Diagnosis not present

## 2023-12-27 DIAGNOSIS — N390 Urinary tract infection, site not specified: Secondary | ICD-10-CM | POA: Diagnosis not present

## 2023-12-27 DIAGNOSIS — M6281 Muscle weakness (generalized): Secondary | ICD-10-CM | POA: Diagnosis not present

## 2023-12-28 DIAGNOSIS — F3341 Major depressive disorder, recurrent, in partial remission: Secondary | ICD-10-CM | POA: Diagnosis not present

## 2023-12-28 DIAGNOSIS — E1143 Type 2 diabetes mellitus with diabetic autonomic (poly)neuropathy: Secondary | ICD-10-CM | POA: Diagnosis not present

## 2023-12-28 DIAGNOSIS — I509 Heart failure, unspecified: Secondary | ICD-10-CM | POA: Diagnosis not present

## 2023-12-28 DIAGNOSIS — L409 Psoriasis, unspecified: Secondary | ICD-10-CM | POA: Diagnosis not present

## 2023-12-28 DIAGNOSIS — I2510 Atherosclerotic heart disease of native coronary artery without angina pectoris: Secondary | ICD-10-CM | POA: Diagnosis not present

## 2023-12-28 DIAGNOSIS — I110 Hypertensive heart disease with heart failure: Secondary | ICD-10-CM | POA: Diagnosis not present

## 2023-12-28 DIAGNOSIS — N390 Urinary tract infection, site not specified: Secondary | ICD-10-CM | POA: Diagnosis not present

## 2023-12-28 DIAGNOSIS — M4802 Spinal stenosis, cervical region: Secondary | ICD-10-CM | POA: Diagnosis not present

## 2023-12-28 DIAGNOSIS — M6281 Muscle weakness (generalized): Secondary | ICD-10-CM | POA: Diagnosis not present

## 2023-12-28 DIAGNOSIS — F419 Anxiety disorder, unspecified: Secondary | ICD-10-CM | POA: Diagnosis not present

## 2023-12-29 DIAGNOSIS — M6281 Muscle weakness (generalized): Secondary | ICD-10-CM | POA: Diagnosis not present

## 2023-12-29 DIAGNOSIS — N390 Urinary tract infection, site not specified: Secondary | ICD-10-CM | POA: Diagnosis not present

## 2023-12-30 DIAGNOSIS — N390 Urinary tract infection, site not specified: Secondary | ICD-10-CM | POA: Diagnosis not present

## 2023-12-30 DIAGNOSIS — M6281 Muscle weakness (generalized): Secondary | ICD-10-CM | POA: Diagnosis not present

## 2023-12-31 DIAGNOSIS — M6281 Muscle weakness (generalized): Secondary | ICD-10-CM | POA: Diagnosis not present

## 2023-12-31 DIAGNOSIS — N390 Urinary tract infection, site not specified: Secondary | ICD-10-CM | POA: Diagnosis not present

## 2024-01-01 DIAGNOSIS — R1900 Intra-abdominal and pelvic swelling, mass and lump, unspecified site: Secondary | ICD-10-CM | POA: Diagnosis not present

## 2024-01-01 DIAGNOSIS — Z221 Carrier of other intestinal infectious diseases: Secondary | ICD-10-CM | POA: Diagnosis not present

## 2024-01-01 DIAGNOSIS — N390 Urinary tract infection, site not specified: Secondary | ICD-10-CM | POA: Diagnosis not present

## 2024-01-01 DIAGNOSIS — K529 Noninfective gastroenteritis and colitis, unspecified: Secondary | ICD-10-CM | POA: Diagnosis not present

## 2024-01-01 DIAGNOSIS — M533 Sacrococcygeal disorders, not elsewhere classified: Secondary | ICD-10-CM | POA: Diagnosis not present

## 2024-01-01 DIAGNOSIS — M6281 Muscle weakness (generalized): Secondary | ICD-10-CM | POA: Diagnosis not present

## 2024-01-02 DIAGNOSIS — N390 Urinary tract infection, site not specified: Secondary | ICD-10-CM | POA: Diagnosis not present

## 2024-01-02 DIAGNOSIS — R109 Unspecified abdominal pain: Secondary | ICD-10-CM | POA: Diagnosis not present

## 2024-01-02 DIAGNOSIS — M6281 Muscle weakness (generalized): Secondary | ICD-10-CM | POA: Diagnosis not present

## 2024-01-03 DIAGNOSIS — M6281 Muscle weakness (generalized): Secondary | ICD-10-CM | POA: Diagnosis not present

## 2024-01-03 DIAGNOSIS — N390 Urinary tract infection, site not specified: Secondary | ICD-10-CM | POA: Diagnosis not present

## 2024-01-04 DIAGNOSIS — N390 Urinary tract infection, site not specified: Secondary | ICD-10-CM | POA: Diagnosis not present

## 2024-01-04 DIAGNOSIS — M6281 Muscle weakness (generalized): Secondary | ICD-10-CM | POA: Diagnosis not present

## 2024-01-05 DIAGNOSIS — N390 Urinary tract infection, site not specified: Secondary | ICD-10-CM | POA: Diagnosis not present

## 2024-01-05 DIAGNOSIS — M6281 Muscle weakness (generalized): Secondary | ICD-10-CM | POA: Diagnosis not present

## 2024-01-06 DIAGNOSIS — M6281 Muscle weakness (generalized): Secondary | ICD-10-CM | POA: Diagnosis not present

## 2024-01-06 DIAGNOSIS — N390 Urinary tract infection, site not specified: Secondary | ICD-10-CM | POA: Diagnosis not present

## 2024-01-07 DIAGNOSIS — N390 Urinary tract infection, site not specified: Secondary | ICD-10-CM | POA: Diagnosis not present

## 2024-01-07 DIAGNOSIS — M6281 Muscle weakness (generalized): Secondary | ICD-10-CM | POA: Diagnosis not present

## 2024-01-08 DIAGNOSIS — N390 Urinary tract infection, site not specified: Secondary | ICD-10-CM | POA: Diagnosis not present

## 2024-01-08 DIAGNOSIS — M6281 Muscle weakness (generalized): Secondary | ICD-10-CM | POA: Diagnosis not present

## 2024-01-09 DIAGNOSIS — N390 Urinary tract infection, site not specified: Secondary | ICD-10-CM | POA: Diagnosis not present

## 2024-01-09 DIAGNOSIS — M6281 Muscle weakness (generalized): Secondary | ICD-10-CM | POA: Diagnosis not present

## 2024-01-10 DIAGNOSIS — M6281 Muscle weakness (generalized): Secondary | ICD-10-CM | POA: Diagnosis not present

## 2024-01-10 DIAGNOSIS — N390 Urinary tract infection, site not specified: Secondary | ICD-10-CM | POA: Diagnosis not present

## 2024-01-11 DIAGNOSIS — M6281 Muscle weakness (generalized): Secondary | ICD-10-CM | POA: Diagnosis not present

## 2024-01-11 DIAGNOSIS — N390 Urinary tract infection, site not specified: Secondary | ICD-10-CM | POA: Diagnosis not present

## 2024-01-12 DIAGNOSIS — N390 Urinary tract infection, site not specified: Secondary | ICD-10-CM | POA: Diagnosis not present

## 2024-01-12 DIAGNOSIS — M6281 Muscle weakness (generalized): Secondary | ICD-10-CM | POA: Diagnosis not present

## 2024-01-14 DIAGNOSIS — M6281 Muscle weakness (generalized): Secondary | ICD-10-CM | POA: Diagnosis not present

## 2024-01-15 DIAGNOSIS — M6281 Muscle weakness (generalized): Secondary | ICD-10-CM | POA: Diagnosis not present

## 2024-01-16 DIAGNOSIS — Z221 Carrier of other intestinal infectious diseases: Secondary | ICD-10-CM | POA: Diagnosis not present

## 2024-01-16 DIAGNOSIS — M6281 Muscle weakness (generalized): Secondary | ICD-10-CM | POA: Diagnosis not present

## 2024-01-16 DIAGNOSIS — R3911 Hesitancy of micturition: Secondary | ICD-10-CM | POA: Diagnosis not present

## 2024-01-16 DIAGNOSIS — K529 Noninfective gastroenteritis and colitis, unspecified: Secondary | ICD-10-CM | POA: Diagnosis not present

## 2024-01-17 DIAGNOSIS — R197 Diarrhea, unspecified: Secondary | ICD-10-CM | POA: Diagnosis not present

## 2024-01-18 DIAGNOSIS — I10 Essential (primary) hypertension: Secondary | ICD-10-CM | POA: Diagnosis not present

## 2024-01-18 DIAGNOSIS — G8929 Other chronic pain: Secondary | ICD-10-CM | POA: Diagnosis not present

## 2024-01-18 DIAGNOSIS — E876 Hypokalemia: Secondary | ICD-10-CM | POA: Diagnosis not present

## 2024-01-18 DIAGNOSIS — M6281 Muscle weakness (generalized): Secondary | ICD-10-CM | POA: Diagnosis not present

## 2024-01-18 DIAGNOSIS — R52 Pain, unspecified: Secondary | ICD-10-CM | POA: Diagnosis not present

## 2024-01-18 DIAGNOSIS — I2510 Atherosclerotic heart disease of native coronary artery without angina pectoris: Secondary | ICD-10-CM | POA: Diagnosis not present

## 2024-01-18 DIAGNOSIS — E119 Type 2 diabetes mellitus without complications: Secondary | ICD-10-CM | POA: Diagnosis not present

## 2024-01-19 DIAGNOSIS — M6281 Muscle weakness (generalized): Secondary | ICD-10-CM | POA: Diagnosis not present

## 2024-01-21 DIAGNOSIS — Z7985 Long-term (current) use of injectable non-insulin antidiabetic drugs: Secondary | ICD-10-CM | POA: Diagnosis not present

## 2024-01-21 DIAGNOSIS — Z7984 Long term (current) use of oral hypoglycemic drugs: Secondary | ICD-10-CM | POA: Diagnosis not present

## 2024-01-21 DIAGNOSIS — K529 Noninfective gastroenteritis and colitis, unspecified: Secondary | ICD-10-CM | POA: Diagnosis not present

## 2024-01-21 DIAGNOSIS — E1140 Type 2 diabetes mellitus with diabetic neuropathy, unspecified: Secondary | ICD-10-CM | POA: Diagnosis not present

## 2024-01-21 DIAGNOSIS — Z221 Carrier of other intestinal infectious diseases: Secondary | ICD-10-CM | POA: Diagnosis not present

## 2024-01-22 DIAGNOSIS — M6281 Muscle weakness (generalized): Secondary | ICD-10-CM | POA: Diagnosis not present

## 2024-01-22 DIAGNOSIS — N390 Urinary tract infection, site not specified: Secondary | ICD-10-CM | POA: Diagnosis not present

## 2024-01-23 DIAGNOSIS — M6281 Muscle weakness (generalized): Secondary | ICD-10-CM | POA: Diagnosis not present

## 2024-01-25 DIAGNOSIS — M6281 Muscle weakness (generalized): Secondary | ICD-10-CM | POA: Diagnosis not present

## 2024-01-26 DIAGNOSIS — M6281 Muscle weakness (generalized): Secondary | ICD-10-CM | POA: Diagnosis not present

## 2024-01-28 DIAGNOSIS — M6281 Muscle weakness (generalized): Secondary | ICD-10-CM | POA: Diagnosis not present

## 2024-01-30 DIAGNOSIS — M6281 Muscle weakness (generalized): Secondary | ICD-10-CM | POA: Diagnosis not present

## 2024-02-01 DIAGNOSIS — M6281 Muscle weakness (generalized): Secondary | ICD-10-CM | POA: Diagnosis not present

## 2024-02-02 DIAGNOSIS — M6281 Muscle weakness (generalized): Secondary | ICD-10-CM | POA: Diagnosis not present

## 2024-02-04 DIAGNOSIS — M6281 Muscle weakness (generalized): Secondary | ICD-10-CM | POA: Diagnosis not present

## 2024-02-06 DIAGNOSIS — M6281 Muscle weakness (generalized): Secondary | ICD-10-CM | POA: Diagnosis not present

## 2024-02-08 DIAGNOSIS — R1110 Vomiting, unspecified: Secondary | ICD-10-CM | POA: Diagnosis not present

## 2024-02-11 DIAGNOSIS — M6281 Muscle weakness (generalized): Secondary | ICD-10-CM | POA: Diagnosis not present

## 2024-02-12 DIAGNOSIS — M6281 Muscle weakness (generalized): Secondary | ICD-10-CM | POA: Diagnosis not present

## 2024-02-13 DIAGNOSIS — M6281 Muscle weakness (generalized): Secondary | ICD-10-CM | POA: Diagnosis not present

## 2024-02-14 DIAGNOSIS — M6281 Muscle weakness (generalized): Secondary | ICD-10-CM | POA: Diagnosis not present

## 2024-02-15 DIAGNOSIS — M6281 Muscle weakness (generalized): Secondary | ICD-10-CM | POA: Diagnosis not present

## 2024-02-18 DIAGNOSIS — M6281 Muscle weakness (generalized): Secondary | ICD-10-CM | POA: Diagnosis not present

## 2024-02-19 DIAGNOSIS — M6281 Muscle weakness (generalized): Secondary | ICD-10-CM | POA: Diagnosis not present

## 2024-02-20 DIAGNOSIS — M6281 Muscle weakness (generalized): Secondary | ICD-10-CM | POA: Diagnosis not present

## 2024-02-22 DIAGNOSIS — M6281 Muscle weakness (generalized): Secondary | ICD-10-CM | POA: Diagnosis not present

## 2024-02-26 DIAGNOSIS — I110 Hypertensive heart disease with heart failure: Secondary | ICD-10-CM | POA: Diagnosis not present

## 2024-02-26 DIAGNOSIS — F419 Anxiety disorder, unspecified: Secondary | ICD-10-CM | POA: Diagnosis not present

## 2024-02-26 DIAGNOSIS — F3341 Major depressive disorder, recurrent, in partial remission: Secondary | ICD-10-CM | POA: Diagnosis not present

## 2024-02-26 DIAGNOSIS — I2510 Atherosclerotic heart disease of native coronary artery without angina pectoris: Secondary | ICD-10-CM | POA: Diagnosis not present

## 2024-02-26 DIAGNOSIS — L409 Psoriasis, unspecified: Secondary | ICD-10-CM | POA: Diagnosis not present

## 2024-02-26 DIAGNOSIS — M6281 Muscle weakness (generalized): Secondary | ICD-10-CM | POA: Diagnosis not present

## 2024-02-26 DIAGNOSIS — E1143 Type 2 diabetes mellitus with diabetic autonomic (poly)neuropathy: Secondary | ICD-10-CM | POA: Diagnosis not present

## 2024-02-26 DIAGNOSIS — I509 Heart failure, unspecified: Secondary | ICD-10-CM | POA: Diagnosis not present

## 2024-02-26 DIAGNOSIS — M4802 Spinal stenosis, cervical region: Secondary | ICD-10-CM | POA: Diagnosis not present

## 2024-03-03 DIAGNOSIS — D649 Anemia, unspecified: Secondary | ICD-10-CM | POA: Diagnosis not present

## 2024-03-03 DIAGNOSIS — E1143 Type 2 diabetes mellitus with diabetic autonomic (poly)neuropathy: Secondary | ICD-10-CM | POA: Diagnosis not present

## 2024-03-03 DIAGNOSIS — I5022 Chronic systolic (congestive) heart failure: Secondary | ICD-10-CM | POA: Diagnosis not present

## 2024-03-03 DIAGNOSIS — I110 Hypertensive heart disease with heart failure: Secondary | ICD-10-CM | POA: Diagnosis not present

## 2024-03-03 DIAGNOSIS — I2510 Atherosclerotic heart disease of native coronary artery without angina pectoris: Secondary | ICD-10-CM | POA: Diagnosis not present

## 2024-03-03 DIAGNOSIS — E1142 Type 2 diabetes mellitus with diabetic polyneuropathy: Secondary | ICD-10-CM | POA: Diagnosis not present

## 2024-03-03 DIAGNOSIS — F3341 Major depressive disorder, recurrent, in partial remission: Secondary | ICD-10-CM | POA: Diagnosis not present

## 2024-03-03 DIAGNOSIS — I951 Orthostatic hypotension: Secondary | ICD-10-CM | POA: Diagnosis not present

## 2024-03-03 DIAGNOSIS — F419 Anxiety disorder, unspecified: Secondary | ICD-10-CM | POA: Diagnosis not present

## 2024-03-13 DIAGNOSIS — D649 Anemia, unspecified: Secondary | ICD-10-CM | POA: Diagnosis not present

## 2024-03-13 DIAGNOSIS — I951 Orthostatic hypotension: Secondary | ICD-10-CM | POA: Diagnosis not present

## 2024-03-13 DIAGNOSIS — E1143 Type 2 diabetes mellitus with diabetic autonomic (poly)neuropathy: Secondary | ICD-10-CM | POA: Diagnosis not present

## 2024-03-13 DIAGNOSIS — I110 Hypertensive heart disease with heart failure: Secondary | ICD-10-CM | POA: Diagnosis not present

## 2024-03-13 DIAGNOSIS — F419 Anxiety disorder, unspecified: Secondary | ICD-10-CM | POA: Diagnosis not present

## 2024-03-13 DIAGNOSIS — E1142 Type 2 diabetes mellitus with diabetic polyneuropathy: Secondary | ICD-10-CM | POA: Diagnosis not present

## 2024-03-13 DIAGNOSIS — I5022 Chronic systolic (congestive) heart failure: Secondary | ICD-10-CM | POA: Diagnosis not present

## 2024-03-13 DIAGNOSIS — I2510 Atherosclerotic heart disease of native coronary artery without angina pectoris: Secondary | ICD-10-CM | POA: Diagnosis not present

## 2024-03-13 DIAGNOSIS — F3341 Major depressive disorder, recurrent, in partial remission: Secondary | ICD-10-CM | POA: Diagnosis not present

## 2024-03-26 DIAGNOSIS — E1143 Type 2 diabetes mellitus with diabetic autonomic (poly)neuropathy: Secondary | ICD-10-CM | POA: Diagnosis not present

## 2024-03-26 DIAGNOSIS — I110 Hypertensive heart disease with heart failure: Secondary | ICD-10-CM | POA: Diagnosis not present

## 2024-03-26 DIAGNOSIS — I5022 Chronic systolic (congestive) heart failure: Secondary | ICD-10-CM | POA: Diagnosis not present

## 2024-03-26 DIAGNOSIS — F419 Anxiety disorder, unspecified: Secondary | ICD-10-CM | POA: Diagnosis not present

## 2024-03-26 DIAGNOSIS — D649 Anemia, unspecified: Secondary | ICD-10-CM | POA: Diagnosis not present

## 2024-03-26 DIAGNOSIS — I951 Orthostatic hypotension: Secondary | ICD-10-CM | POA: Diagnosis not present

## 2024-03-26 DIAGNOSIS — E1142 Type 2 diabetes mellitus with diabetic polyneuropathy: Secondary | ICD-10-CM | POA: Diagnosis not present

## 2024-03-26 DIAGNOSIS — F3341 Major depressive disorder, recurrent, in partial remission: Secondary | ICD-10-CM | POA: Diagnosis not present

## 2024-03-26 DIAGNOSIS — I2510 Atherosclerotic heart disease of native coronary artery without angina pectoris: Secondary | ICD-10-CM | POA: Diagnosis not present

## 2024-03-28 DIAGNOSIS — E1143 Type 2 diabetes mellitus with diabetic autonomic (poly)neuropathy: Secondary | ICD-10-CM | POA: Diagnosis not present

## 2024-03-28 DIAGNOSIS — I5022 Chronic systolic (congestive) heart failure: Secondary | ICD-10-CM | POA: Diagnosis not present

## 2024-03-28 DIAGNOSIS — F3341 Major depressive disorder, recurrent, in partial remission: Secondary | ICD-10-CM | POA: Diagnosis not present

## 2024-03-28 DIAGNOSIS — F419 Anxiety disorder, unspecified: Secondary | ICD-10-CM | POA: Diagnosis not present

## 2024-03-28 DIAGNOSIS — I2510 Atherosclerotic heart disease of native coronary artery without angina pectoris: Secondary | ICD-10-CM | POA: Diagnosis not present

## 2024-03-28 DIAGNOSIS — I110 Hypertensive heart disease with heart failure: Secondary | ICD-10-CM | POA: Diagnosis not present

## 2024-03-28 DIAGNOSIS — D649 Anemia, unspecified: Secondary | ICD-10-CM | POA: Diagnosis not present

## 2024-03-28 DIAGNOSIS — I951 Orthostatic hypotension: Secondary | ICD-10-CM | POA: Diagnosis not present

## 2024-03-28 DIAGNOSIS — E1142 Type 2 diabetes mellitus with diabetic polyneuropathy: Secondary | ICD-10-CM | POA: Diagnosis not present

## 2024-04-03 DIAGNOSIS — D649 Anemia, unspecified: Secondary | ICD-10-CM | POA: Diagnosis not present

## 2024-04-03 DIAGNOSIS — E1143 Type 2 diabetes mellitus with diabetic autonomic (poly)neuropathy: Secondary | ICD-10-CM | POA: Diagnosis not present

## 2024-04-03 DIAGNOSIS — F419 Anxiety disorder, unspecified: Secondary | ICD-10-CM | POA: Diagnosis not present

## 2024-04-03 DIAGNOSIS — I110 Hypertensive heart disease with heart failure: Secondary | ICD-10-CM | POA: Diagnosis not present

## 2024-04-03 DIAGNOSIS — F3341 Major depressive disorder, recurrent, in partial remission: Secondary | ICD-10-CM | POA: Diagnosis not present

## 2024-04-03 DIAGNOSIS — I2510 Atherosclerotic heart disease of native coronary artery without angina pectoris: Secondary | ICD-10-CM | POA: Diagnosis not present

## 2024-04-03 DIAGNOSIS — I951 Orthostatic hypotension: Secondary | ICD-10-CM | POA: Diagnosis not present

## 2024-04-03 DIAGNOSIS — I5022 Chronic systolic (congestive) heart failure: Secondary | ICD-10-CM | POA: Diagnosis not present

## 2024-04-03 DIAGNOSIS — E1142 Type 2 diabetes mellitus with diabetic polyneuropathy: Secondary | ICD-10-CM | POA: Diagnosis not present

## 2024-04-11 DIAGNOSIS — E1143 Type 2 diabetes mellitus with diabetic autonomic (poly)neuropathy: Secondary | ICD-10-CM | POA: Diagnosis not present

## 2024-04-11 DIAGNOSIS — I110 Hypertensive heart disease with heart failure: Secondary | ICD-10-CM | POA: Diagnosis not present

## 2024-04-11 DIAGNOSIS — I951 Orthostatic hypotension: Secondary | ICD-10-CM | POA: Diagnosis not present

## 2024-04-11 DIAGNOSIS — E1142 Type 2 diabetes mellitus with diabetic polyneuropathy: Secondary | ICD-10-CM | POA: Diagnosis not present

## 2024-04-11 DIAGNOSIS — F419 Anxiety disorder, unspecified: Secondary | ICD-10-CM | POA: Diagnosis not present

## 2024-04-11 DIAGNOSIS — I5022 Chronic systolic (congestive) heart failure: Secondary | ICD-10-CM | POA: Diagnosis not present

## 2024-04-11 DIAGNOSIS — D649 Anemia, unspecified: Secondary | ICD-10-CM | POA: Diagnosis not present

## 2024-04-11 DIAGNOSIS — F3341 Major depressive disorder, recurrent, in partial remission: Secondary | ICD-10-CM | POA: Diagnosis not present

## 2024-04-11 DIAGNOSIS — I2510 Atherosclerotic heart disease of native coronary artery without angina pectoris: Secondary | ICD-10-CM | POA: Diagnosis not present

## 2024-04-15 DIAGNOSIS — I5022 Chronic systolic (congestive) heart failure: Secondary | ICD-10-CM | POA: Diagnosis not present

## 2024-04-15 DIAGNOSIS — E1142 Type 2 diabetes mellitus with diabetic polyneuropathy: Secondary | ICD-10-CM | POA: Diagnosis not present

## 2024-04-15 DIAGNOSIS — D649 Anemia, unspecified: Secondary | ICD-10-CM | POA: Diagnosis not present

## 2024-04-15 DIAGNOSIS — E1143 Type 2 diabetes mellitus with diabetic autonomic (poly)neuropathy: Secondary | ICD-10-CM | POA: Diagnosis not present

## 2024-04-15 DIAGNOSIS — F419 Anxiety disorder, unspecified: Secondary | ICD-10-CM | POA: Diagnosis not present

## 2024-04-15 DIAGNOSIS — I110 Hypertensive heart disease with heart failure: Secondary | ICD-10-CM | POA: Diagnosis not present

## 2024-04-15 DIAGNOSIS — I2510 Atherosclerotic heart disease of native coronary artery without angina pectoris: Secondary | ICD-10-CM | POA: Diagnosis not present

## 2024-04-15 DIAGNOSIS — F3341 Major depressive disorder, recurrent, in partial remission: Secondary | ICD-10-CM | POA: Diagnosis not present

## 2024-04-15 DIAGNOSIS — I951 Orthostatic hypotension: Secondary | ICD-10-CM | POA: Diagnosis not present

## 2024-04-16 DIAGNOSIS — K219 Gastro-esophageal reflux disease without esophagitis: Secondary | ICD-10-CM | POA: Diagnosis not present

## 2024-04-16 DIAGNOSIS — I429 Cardiomyopathy, unspecified: Secondary | ICD-10-CM | POA: Diagnosis not present

## 2024-04-16 DIAGNOSIS — I509 Heart failure, unspecified: Secondary | ICD-10-CM | POA: Diagnosis not present

## 2024-04-16 DIAGNOSIS — I10 Essential (primary) hypertension: Secondary | ICD-10-CM | POA: Diagnosis not present

## 2024-04-16 DIAGNOSIS — E7800 Pure hypercholesterolemia, unspecified: Secondary | ICD-10-CM | POA: Diagnosis not present

## 2024-04-16 DIAGNOSIS — U099 Post covid-19 condition, unspecified: Secondary | ICD-10-CM | POA: Diagnosis not present

## 2024-04-16 DIAGNOSIS — I951 Orthostatic hypotension: Secondary | ICD-10-CM | POA: Diagnosis not present

## 2024-04-16 DIAGNOSIS — E1140 Type 2 diabetes mellitus with diabetic neuropathy, unspecified: Secondary | ICD-10-CM | POA: Diagnosis not present

## 2024-04-24 DIAGNOSIS — E1142 Type 2 diabetes mellitus with diabetic polyneuropathy: Secondary | ICD-10-CM | POA: Diagnosis not present

## 2024-04-24 DIAGNOSIS — I951 Orthostatic hypotension: Secondary | ICD-10-CM | POA: Diagnosis not present

## 2024-04-24 DIAGNOSIS — E1143 Type 2 diabetes mellitus with diabetic autonomic (poly)neuropathy: Secondary | ICD-10-CM | POA: Diagnosis not present

## 2024-04-24 DIAGNOSIS — F3341 Major depressive disorder, recurrent, in partial remission: Secondary | ICD-10-CM | POA: Diagnosis not present

## 2024-04-24 DIAGNOSIS — D649 Anemia, unspecified: Secondary | ICD-10-CM | POA: Diagnosis not present

## 2024-04-24 DIAGNOSIS — F419 Anxiety disorder, unspecified: Secondary | ICD-10-CM | POA: Diagnosis not present

## 2024-04-24 DIAGNOSIS — I2510 Atherosclerotic heart disease of native coronary artery without angina pectoris: Secondary | ICD-10-CM | POA: Diagnosis not present

## 2024-04-24 DIAGNOSIS — I110 Hypertensive heart disease with heart failure: Secondary | ICD-10-CM | POA: Diagnosis not present

## 2024-04-24 DIAGNOSIS — I5022 Chronic systolic (congestive) heart failure: Secondary | ICD-10-CM | POA: Diagnosis not present

## 2024-04-25 DIAGNOSIS — I5022 Chronic systolic (congestive) heart failure: Secondary | ICD-10-CM | POA: Diagnosis not present

## 2024-04-25 DIAGNOSIS — E1142 Type 2 diabetes mellitus with diabetic polyneuropathy: Secondary | ICD-10-CM | POA: Diagnosis not present

## 2024-04-25 DIAGNOSIS — F419 Anxiety disorder, unspecified: Secondary | ICD-10-CM | POA: Diagnosis not present

## 2024-04-25 DIAGNOSIS — F3341 Major depressive disorder, recurrent, in partial remission: Secondary | ICD-10-CM | POA: Diagnosis not present

## 2024-04-25 DIAGNOSIS — I2510 Atherosclerotic heart disease of native coronary artery without angina pectoris: Secondary | ICD-10-CM | POA: Diagnosis not present

## 2024-04-25 DIAGNOSIS — I110 Hypertensive heart disease with heart failure: Secondary | ICD-10-CM | POA: Diagnosis not present

## 2024-04-25 DIAGNOSIS — E1143 Type 2 diabetes mellitus with diabetic autonomic (poly)neuropathy: Secondary | ICD-10-CM | POA: Diagnosis not present

## 2024-04-25 DIAGNOSIS — D649 Anemia, unspecified: Secondary | ICD-10-CM | POA: Diagnosis not present

## 2024-04-25 DIAGNOSIS — I951 Orthostatic hypotension: Secondary | ICD-10-CM | POA: Diagnosis not present

## 2024-04-29 DIAGNOSIS — F3341 Major depressive disorder, recurrent, in partial remission: Secondary | ICD-10-CM | POA: Diagnosis not present

## 2024-04-29 DIAGNOSIS — I2510 Atherosclerotic heart disease of native coronary artery without angina pectoris: Secondary | ICD-10-CM | POA: Diagnosis not present

## 2024-04-29 DIAGNOSIS — D649 Anemia, unspecified: Secondary | ICD-10-CM | POA: Diagnosis not present

## 2024-04-29 DIAGNOSIS — I951 Orthostatic hypotension: Secondary | ICD-10-CM | POA: Diagnosis not present

## 2024-04-29 DIAGNOSIS — I5022 Chronic systolic (congestive) heart failure: Secondary | ICD-10-CM | POA: Diagnosis not present

## 2024-04-29 DIAGNOSIS — I110 Hypertensive heart disease with heart failure: Secondary | ICD-10-CM | POA: Diagnosis not present

## 2024-04-29 DIAGNOSIS — F419 Anxiety disorder, unspecified: Secondary | ICD-10-CM | POA: Diagnosis not present

## 2024-04-29 DIAGNOSIS — E1142 Type 2 diabetes mellitus with diabetic polyneuropathy: Secondary | ICD-10-CM | POA: Diagnosis not present

## 2024-04-29 DIAGNOSIS — E1143 Type 2 diabetes mellitus with diabetic autonomic (poly)neuropathy: Secondary | ICD-10-CM | POA: Diagnosis not present

## 2024-05-02 DIAGNOSIS — I110 Hypertensive heart disease with heart failure: Secondary | ICD-10-CM | POA: Diagnosis not present

## 2024-05-02 DIAGNOSIS — I5022 Chronic systolic (congestive) heart failure: Secondary | ICD-10-CM | POA: Diagnosis not present

## 2024-05-02 DIAGNOSIS — E1143 Type 2 diabetes mellitus with diabetic autonomic (poly)neuropathy: Secondary | ICD-10-CM | POA: Diagnosis not present

## 2024-05-02 DIAGNOSIS — E1142 Type 2 diabetes mellitus with diabetic polyneuropathy: Secondary | ICD-10-CM | POA: Diagnosis not present

## 2024-05-02 DIAGNOSIS — I2510 Atherosclerotic heart disease of native coronary artery without angina pectoris: Secondary | ICD-10-CM | POA: Diagnosis not present

## 2024-05-02 DIAGNOSIS — F419 Anxiety disorder, unspecified: Secondary | ICD-10-CM | POA: Diagnosis not present

## 2024-05-02 DIAGNOSIS — I951 Orthostatic hypotension: Secondary | ICD-10-CM | POA: Diagnosis not present

## 2024-05-02 DIAGNOSIS — F3341 Major depressive disorder, recurrent, in partial remission: Secondary | ICD-10-CM | POA: Diagnosis not present

## 2024-05-02 DIAGNOSIS — D649 Anemia, unspecified: Secondary | ICD-10-CM | POA: Diagnosis not present

## 2024-05-07 DIAGNOSIS — I110 Hypertensive heart disease with heart failure: Secondary | ICD-10-CM | POA: Diagnosis not present

## 2024-05-07 DIAGNOSIS — E1142 Type 2 diabetes mellitus with diabetic polyneuropathy: Secondary | ICD-10-CM | POA: Diagnosis not present

## 2024-05-07 DIAGNOSIS — I2510 Atherosclerotic heart disease of native coronary artery without angina pectoris: Secondary | ICD-10-CM | POA: Diagnosis not present

## 2024-05-07 DIAGNOSIS — E1143 Type 2 diabetes mellitus with diabetic autonomic (poly)neuropathy: Secondary | ICD-10-CM | POA: Diagnosis not present

## 2024-05-07 DIAGNOSIS — D649 Anemia, unspecified: Secondary | ICD-10-CM | POA: Diagnosis not present

## 2024-05-07 DIAGNOSIS — F3341 Major depressive disorder, recurrent, in partial remission: Secondary | ICD-10-CM | POA: Diagnosis not present

## 2024-05-07 DIAGNOSIS — I5022 Chronic systolic (congestive) heart failure: Secondary | ICD-10-CM | POA: Diagnosis not present

## 2024-05-07 DIAGNOSIS — F419 Anxiety disorder, unspecified: Secondary | ICD-10-CM | POA: Diagnosis not present

## 2024-05-07 DIAGNOSIS — I951 Orthostatic hypotension: Secondary | ICD-10-CM | POA: Diagnosis not present

## 2024-05-13 DIAGNOSIS — R8290 Unspecified abnormal findings in urine: Secondary | ICD-10-CM | POA: Diagnosis not present

## 2024-05-13 DIAGNOSIS — E1141 Type 2 diabetes mellitus with diabetic mononeuropathy: Secondary | ICD-10-CM | POA: Diagnosis not present

## 2024-05-16 DIAGNOSIS — R8290 Unspecified abnormal findings in urine: Secondary | ICD-10-CM | POA: Diagnosis not present

## 2024-06-05 DIAGNOSIS — E1142 Type 2 diabetes mellitus with diabetic polyneuropathy: Secondary | ICD-10-CM | POA: Diagnosis not present

## 2024-06-05 DIAGNOSIS — F3341 Major depressive disorder, recurrent, in partial remission: Secondary | ICD-10-CM | POA: Diagnosis not present

## 2024-06-05 DIAGNOSIS — I110 Hypertensive heart disease with heart failure: Secondary | ICD-10-CM | POA: Diagnosis not present

## 2024-06-05 DIAGNOSIS — I951 Orthostatic hypotension: Secondary | ICD-10-CM | POA: Diagnosis not present

## 2024-06-05 DIAGNOSIS — I5022 Chronic systolic (congestive) heart failure: Secondary | ICD-10-CM | POA: Diagnosis not present

## 2024-06-05 DIAGNOSIS — E1143 Type 2 diabetes mellitus with diabetic autonomic (poly)neuropathy: Secondary | ICD-10-CM | POA: Diagnosis not present

## 2024-06-05 DIAGNOSIS — D649 Anemia, unspecified: Secondary | ICD-10-CM | POA: Diagnosis not present

## 2024-06-05 DIAGNOSIS — I2510 Atherosclerotic heart disease of native coronary artery without angina pectoris: Secondary | ICD-10-CM | POA: Diagnosis not present

## 2024-06-05 DIAGNOSIS — F419 Anxiety disorder, unspecified: Secondary | ICD-10-CM | POA: Diagnosis not present

## 2024-06-17 DIAGNOSIS — E1143 Type 2 diabetes mellitus with diabetic autonomic (poly)neuropathy: Secondary | ICD-10-CM | POA: Diagnosis not present

## 2024-06-17 DIAGNOSIS — F3341 Major depressive disorder, recurrent, in partial remission: Secondary | ICD-10-CM | POA: Diagnosis not present

## 2024-06-17 DIAGNOSIS — F419 Anxiety disorder, unspecified: Secondary | ICD-10-CM | POA: Diagnosis not present

## 2024-06-17 DIAGNOSIS — I110 Hypertensive heart disease with heart failure: Secondary | ICD-10-CM | POA: Diagnosis not present

## 2024-06-17 DIAGNOSIS — I2510 Atherosclerotic heart disease of native coronary artery without angina pectoris: Secondary | ICD-10-CM | POA: Diagnosis not present

## 2024-06-17 DIAGNOSIS — D649 Anemia, unspecified: Secondary | ICD-10-CM | POA: Diagnosis not present

## 2024-06-17 DIAGNOSIS — I5022 Chronic systolic (congestive) heart failure: Secondary | ICD-10-CM | POA: Diagnosis not present

## 2024-06-17 DIAGNOSIS — I951 Orthostatic hypotension: Secondary | ICD-10-CM | POA: Diagnosis not present
